# Patient Record
Sex: Female | Born: 1980
Health system: Southern US, Community
[De-identification: ages and names within clinical notes are randomized; demographics above are authoritative.]

## PROBLEM LIST (undated history)

## (undated) DIAGNOSIS — M26649 Arthritis of unspecified temporomandibular joint: Secondary | ICD-10-CM

## (undated) DIAGNOSIS — R112 Nausea with vomiting, unspecified: Secondary | ICD-10-CM

## (undated) DIAGNOSIS — F32A Depression, unspecified: Secondary | ICD-10-CM

## (undated) DIAGNOSIS — R51 Headache: Secondary | ICD-10-CM

## (undated) DIAGNOSIS — IMO0002 Reserved for concepts with insufficient information to code with codable children: Secondary | ICD-10-CM

## (undated) DIAGNOSIS — M1990 Unspecified osteoarthritis, unspecified site: Secondary | ICD-10-CM

## (undated) DIAGNOSIS — M329 Systemic lupus erythematosus, unspecified: Secondary | ICD-10-CM

## (undated) DIAGNOSIS — Z87442 Personal history of urinary calculi: Secondary | ICD-10-CM

## (undated) DIAGNOSIS — G4730 Sleep apnea, unspecified: Secondary | ICD-10-CM

## (undated) DIAGNOSIS — M2669 Other specified disorders of temporomandibular joint: Secondary | ICD-10-CM

## (undated) DIAGNOSIS — Z9889 Other specified postprocedural states: Secondary | ICD-10-CM

## (undated) DIAGNOSIS — F419 Anxiety disorder, unspecified: Secondary | ICD-10-CM

## (undated) DIAGNOSIS — F329 Major depressive disorder, single episode, unspecified: Secondary | ICD-10-CM

## (undated) DIAGNOSIS — I10 Essential (primary) hypertension: Secondary | ICD-10-CM

## (undated) DIAGNOSIS — R42 Dizziness and giddiness: Secondary | ICD-10-CM

## (undated) DIAGNOSIS — T148XXA Other injury of unspecified body region, initial encounter: Secondary | ICD-10-CM

## (undated) HISTORY — DX: Other injury of unspecified body region, initial encounter: T14.8XXA

## (undated) HISTORY — DX: Sleep apnea, unspecified: G47.30

## (undated) HISTORY — PX: CRANIOTOMY: SHX93

## (undated) HISTORY — PX: DENTAL SURGERY: SHX609

## (undated) HISTORY — PX: HERNIA REPAIR: SHX51

---

## 2005-10-23 ENCOUNTER — Ambulatory Visit (HOSPITAL_COMMUNITY): Admission: RE | Admit: 2005-10-23 | Discharge: 2005-10-23 | Payer: Self-pay | Admitting: Neurosurgery

## 2005-11-20 ENCOUNTER — Inpatient Hospital Stay (HOSPITAL_COMMUNITY): Admission: RE | Admit: 2005-11-20 | Discharge: 2005-11-23 | Payer: Self-pay | Admitting: Neurosurgery

## 2005-12-03 ENCOUNTER — Encounter: Admission: RE | Admit: 2005-12-03 | Discharge: 2005-12-03 | Payer: Self-pay | Admitting: Neurosurgery

## 2006-02-26 ENCOUNTER — Ambulatory Visit (HOSPITAL_COMMUNITY): Admission: RE | Admit: 2006-02-26 | Discharge: 2006-02-26 | Payer: Self-pay | Admitting: Neurosurgery

## 2010-08-21 ENCOUNTER — Emergency Department: Payer: Self-pay | Admitting: Emergency Medicine

## 2012-02-11 ENCOUNTER — Emergency Department: Payer: Self-pay | Admitting: Unknown Physician Specialty

## 2012-11-11 HISTORY — PX: OTHER SURGICAL HISTORY: SHX169

## 2012-11-25 ENCOUNTER — Ambulatory Visit: Payer: Self-pay | Admitting: Physician Assistant

## 2013-05-24 ENCOUNTER — Ambulatory Visit: Payer: Self-pay | Admitting: Neurology

## 2013-05-28 ENCOUNTER — Emergency Department: Payer: Self-pay | Admitting: Emergency Medicine

## 2013-06-07 ENCOUNTER — Ambulatory Visit: Payer: Self-pay | Admitting: Orthopedic Surgery

## 2013-06-15 ENCOUNTER — Ambulatory Visit: Payer: Self-pay | Admitting: Orthopedic Surgery

## 2013-07-30 ENCOUNTER — Ambulatory Visit: Payer: Self-pay | Admitting: Neurology

## 2013-09-01 ENCOUNTER — Other Ambulatory Visit: Payer: Self-pay | Admitting: Neurosurgery

## 2013-09-21 ENCOUNTER — Encounter (HOSPITAL_COMMUNITY): Payer: Self-pay | Admitting: Pharmacy Technician

## 2013-09-24 ENCOUNTER — Encounter (HOSPITAL_COMMUNITY)
Admission: RE | Admit: 2013-09-24 | Discharge: 2013-09-24 | Disposition: A | Discharge status: Home / Self Care | Payer: Medicaid Other | Source: Ambulatory Visit | Attending: Anesthesiology | Admitting: Anesthesiology

## 2013-09-24 ENCOUNTER — Encounter (HOSPITAL_COMMUNITY): Payer: Self-pay

## 2013-09-24 ENCOUNTER — Encounter (HOSPITAL_COMMUNITY)
Admission: RE | Admit: 2013-09-24 | Discharge: 2013-09-24 | Disposition: A | Discharge status: Home / Self Care | Payer: Medicaid Other | Source: Ambulatory Visit | Attending: Neurosurgery | Admitting: Neurosurgery

## 2013-09-24 DIAGNOSIS — Z01812 Encounter for preprocedural laboratory examination: Secondary | ICD-10-CM | POA: Insufficient documentation

## 2013-09-24 DIAGNOSIS — Z01818 Encounter for other preprocedural examination: Secondary | ICD-10-CM | POA: Insufficient documentation

## 2013-09-24 HISTORY — DX: Dizziness and giddiness: R42

## 2013-09-24 HISTORY — DX: Reserved for concepts with insufficient information to code with codable children: IMO0002

## 2013-09-24 HISTORY — DX: Other specified postprocedural states: R11.2

## 2013-09-24 HISTORY — DX: Major depressive disorder, single episode, unspecified: F32.9

## 2013-09-24 HISTORY — DX: Anxiety disorder, unspecified: F41.9

## 2013-09-24 HISTORY — DX: Unspecified osteoarthritis, unspecified site: M19.90

## 2013-09-24 HISTORY — DX: Personal history of urinary calculi: Z87.442

## 2013-09-24 HISTORY — DX: Essential (primary) hypertension: I10

## 2013-09-24 HISTORY — DX: Other specified postprocedural states: Z98.890

## 2013-09-24 HISTORY — DX: Systemic lupus erythematosus, unspecified: M32.9

## 2013-09-24 HISTORY — DX: Depression, unspecified: F32.A

## 2013-09-24 HISTORY — DX: Headache: R51

## 2013-09-24 LAB — CBC
HCT: 36.9 % (ref 36.0–46.0)
MCV: 86.2 fL (ref 78.0–100.0)
RBC: 4.28 MIL/uL (ref 3.87–5.11)
RDW: 13.8 % (ref 11.5–15.5)
WBC: 7.4 10*3/uL (ref 4.0–10.5)

## 2013-09-24 LAB — BASIC METABOLIC PANEL
BUN: 7 mg/dL (ref 6–23)
Calcium: 9.3 mg/dL (ref 8.4–10.5)
Creatinine, Ser: 0.69 mg/dL (ref 0.50–1.10)
GFR calc non Af Amer: >90 mL/min (ref >90)
Glucose, Bld: 95 mg/dL (ref 70–99)
Sodium: 137 mEq/L (ref 135–145)

## 2013-09-24 LAB — HCG, SERUM, QUALITATIVE: Preg, Serum: NEGATIVE

## 2013-09-24 LAB — TYPE AND SCREEN
ABO/RH(D): O POS
Antibody Screen: NEGATIVE

## 2013-09-24 NOTE — Pre-Procedure Instructions (Signed)
Morgan Kent  09/24/2013   Your procedure is scheduled on:  Wednesday, November 19th  Report to Main Entrance "A" and check in with admitting at 0745 AM.  Call this number if you have problems the morning of surgery: [phone]   Remember:   Do not eat food or drink liquids after midnight.   Take these medicines the morning of surgery with A SIP OF WATER: atenolol, neurontin, zoloft, flonase   Do not wear jewelry, make-up or nail polish.  Do not wear lotions, powders, or perfumes. You may wear deodorant.  Do not shave 48 hours prior to surgery. Men may shave face and neck.  Do not bring valuables to the hospital.  [facility] is not responsible   for any belongings or valuables.               Contacts, dentures or bridgework may not be worn into surgery.  Leave suitcase in the car. After surgery it may be brought to your room.  For patients admitted to the hospital, discharge time is determined by your  treatment team.               Patients discharged the day of surgery will not be allowed to drive home.    Special Instructions: Shower using CHG 2 nights before surgery and the night before surgery.  If you shower the day of surgery use CHG.  Use special wash - you have one bottle of CHG for all showers.  You should use approximately 1/3 of the bottle for each shower.   Please read over the following fact sheets that you were given: Pain Booklet, Coughing and Deep Breathing, Blood Transfusion Information, MRSA Information and Surgical Site Infection Prevention

## 2013-09-24 NOTE — Progress Notes (Signed)
Primary - dr. Welton Flakes  At [facility] medical Does not have a cardiologist  ekg beginning of this year - will request records

## 2013-09-28 MED ORDER — CEFAZOLIN SODIUM-DEXTROSE 2-3 GM-% IV SOLR
2.0000 g | INTRAVENOUS | Status: AC
  Administered 2013-09-29: 2 g via INTRAVENOUS

## 2013-09-29 ENCOUNTER — Encounter (HOSPITAL_COMMUNITY): Payer: Self-pay | Admitting: Certified Registered Nurse Anesthetist

## 2013-09-29 ENCOUNTER — Ambulatory Visit (HOSPITAL_COMMUNITY): Payer: Medicaid Other

## 2013-09-29 ENCOUNTER — Encounter (HOSPITAL_COMMUNITY): Payer: Medicaid Other | Admitting: Certified Registered Nurse Anesthetist

## 2013-09-29 ENCOUNTER — Inpatient Hospital Stay (HOSPITAL_COMMUNITY)
Admission: RE | Admit: 2013-09-29 | Discharge: 2013-10-02 | DRG: 025 | Disposition: A | Discharge status: Home / Self Care | Payer: Medicaid Other | Source: Ambulatory Visit | Attending: Neurosurgery | Admitting: Neurosurgery

## 2013-09-29 ENCOUNTER — Other Ambulatory Visit: Payer: Self-pay

## 2013-09-29 ENCOUNTER — Encounter (HOSPITAL_COMMUNITY)
Admission: RE | Disposition: A | Discharge status: Home / Self Care | Payer: Self-pay | Source: Ambulatory Visit | Attending: Neurosurgery

## 2013-09-29 ENCOUNTER — Ambulatory Visit (HOSPITAL_COMMUNITY): Payer: Medicaid Other | Admitting: Certified Registered Nurse Anesthetist

## 2013-09-29 DIAGNOSIS — G950 Syringomyelia and syringobulbia: Principal | ICD-10-CM | POA: Diagnosis present

## 2013-09-29 DIAGNOSIS — M329 Systemic lupus erythematosus, unspecified: Secondary | ICD-10-CM | POA: Diagnosis present

## 2013-09-29 DIAGNOSIS — Z79899 Other long term (current) drug therapy: Secondary | ICD-10-CM

## 2013-09-29 DIAGNOSIS — F411 Generalized anxiety disorder: Secondary | ICD-10-CM | POA: Diagnosis present

## 2013-09-29 DIAGNOSIS — Z6841 Body Mass Index (BMI) 40.0 and over, adult: Secondary | ICD-10-CM

## 2013-09-29 DIAGNOSIS — G935 Compression of brain: Secondary | ICD-10-CM | POA: Diagnosis present

## 2013-09-29 DIAGNOSIS — F3289 Other specified depressive episodes: Secondary | ICD-10-CM | POA: Diagnosis present

## 2013-09-29 DIAGNOSIS — Z23 Encounter for immunization: Secondary | ICD-10-CM

## 2013-09-29 DIAGNOSIS — F329 Major depressive disorder, single episode, unspecified: Secondary | ICD-10-CM | POA: Diagnosis present

## 2013-09-29 DIAGNOSIS — I10 Essential (primary) hypertension: Secondary | ICD-10-CM | POA: Diagnosis present

## 2013-09-29 HISTORY — PX: SUBOCCIPITAL CRANIECTOMY CERVICAL LAMINECTOMY: SHX5404

## 2013-09-29 SURGERY — SUBOCCIPITAL CRANIECTOMY CERVICAL LAMINECTOMY/DURAPLASTY
Anesthesia: General | Site: Neck | Wound class: Clean

## 2013-09-29 MED ORDER — VECURONIUM BROMIDE 10 MG IV SOLR
INTRAVENOUS | Status: DC | PRN
  Administered 2013-09-29: 2 mg via INTRAVENOUS
  Administered 2013-09-29 (×4): 1 mg via INTRAVENOUS

## 2013-09-29 MED ORDER — SERTRALINE HCL 100 MG PO TABS
100.0000 mg | ORAL_TABLET | Freq: Two times a day (BID) | ORAL | Status: DC
Start: 2013-09-29 — End: 2013-10-02
  Administered 2013-09-29 – 2013-10-02 (×7): 100 mg via ORAL
  Filled 2013-09-29 (×8): qty 1

## 2013-09-29 MED ORDER — GLYCOPYRROLATE 0.2 MG/ML IJ SOLN
INTRAMUSCULAR | Status: DC | PRN
  Administered 2013-09-29: .8 mg via INTRAVENOUS

## 2013-09-29 MED ORDER — OXYCODONE HCL 5 MG PO TABS
5.0000 mg | ORAL_TABLET | Freq: Once | ORAL | Status: DC | PRN
Start: 2013-09-29 — End: 2013-09-29

## 2013-09-29 MED ORDER — SODIUM CHLORIDE 0.9 % IR SOLN
Status: DC | PRN
  Administered 2013-09-29: 13:00:00

## 2013-09-29 MED ORDER — CEFAZOLIN SODIUM-DEXTROSE 2-3 GM-% IV SOLR
2.0000 g | Freq: Three times a day (TID) | INTRAVENOUS | Status: DC
  Administered 2013-09-29: 2 g via INTRAVENOUS
  Filled 2013-09-29 (×3): qty 50

## 2013-09-29 MED ORDER — OXYCODONE HCL 5 MG/5ML PO SOLN: 5.0000 mg | Freq: Once | ORAL | Status: DC | PRN

## 2013-09-29 MED ORDER — LACTATED RINGERS IV SOLN
INTRAVENOUS | Status: DC | PRN
  Administered 2013-09-29 (×2): via INTRAVENOUS

## 2013-09-29 MED ORDER — HYDROMORPHONE HCL PF 1 MG/ML IJ SOLN
INTRAMUSCULAR | Status: AC
  Filled 2013-09-29: qty 1

## 2013-09-29 MED ORDER — PROMETHAZINE HCL 25 MG/ML IJ SOLN: 6.2500 mg | INTRAMUSCULAR | Status: DC | PRN

## 2013-09-29 MED ORDER — ONDANSETRON HCL 4 MG PO TABS: 4.0000 mg | ORAL_TABLET | ORAL | Status: DC | PRN

## 2013-09-29 MED ORDER — FLUTICASONE PROPIONATE 50 MCG/ACT NA SUSP
1.0000 | Freq: Every day | NASAL | Status: DC
  Administered 2013-09-29 – 2013-10-02 (×3): 1 via NASAL
  Filled 2013-09-29: qty 16

## 2013-09-29 MED ORDER — NORTRIPTYLINE HCL 10 MG PO CAPS
20.0000 mg | ORAL_CAPSULE | Freq: Every day | ORAL | Status: DC
  Administered 2013-09-29 – 2013-10-01 (×3): 20 mg via ORAL
  Filled 2013-09-29 (×4): qty 2

## 2013-09-29 MED ORDER — MORPHINE SULFATE 2 MG/ML IJ SOLN
1.0000 mg | INTRAMUSCULAR | Status: DC | PRN
  Administered 2013-09-29 – 2013-10-01 (×6): 2 mg via INTRAVENOUS
  Filled 2013-09-29 (×6): qty 1

## 2013-09-29 MED ORDER — DOCUSATE SODIUM 100 MG PO CAPS
100.0000 mg | ORAL_CAPSULE | Freq: Two times a day (BID) | ORAL | Status: DC
  Administered 2013-09-29 – 2013-10-02 (×7): 100 mg via ORAL
  Filled 2013-09-29 (×7): qty 1

## 2013-09-29 MED ORDER — ONDANSETRON HCL 4 MG/2ML IJ SOLN
INTRAMUSCULAR | Status: DC | PRN
  Administered 2013-09-29: 4 mg via INTRAVENOUS

## 2013-09-29 MED ORDER — FENTANYL CITRATE 0.05 MG/ML IJ SOLN
INTRAMUSCULAR | Status: DC | PRN
  Administered 2013-09-29 (×7): 50 ug via INTRAVENOUS
  Administered 2013-09-29: 100 ug via INTRAVENOUS
  Administered 2013-09-29: 50 ug via INTRAVENOUS

## 2013-09-29 MED ORDER — PROPOFOL 10 MG/ML IV BOLUS
INTRAVENOUS | Status: DC | PRN
  Administered 2013-09-29: 80 mg via INTRAVENOUS
  Administered 2013-09-29: 150 mg via INTRAVENOUS

## 2013-09-29 MED ORDER — HYDROMORPHONE HCL PF 1 MG/ML IJ SOLN
0.2500 mg | INTRAMUSCULAR | Status: DC | PRN
  Administered 2013-09-29 (×4): 0.5 mg via INTRAVENOUS

## 2013-09-29 MED ORDER — PROMETHAZINE HCL 12.5 MG PO TABS
12.5000 mg | ORAL_TABLET | ORAL | Status: DC | PRN
  Filled 2013-09-29: qty 2

## 2013-09-29 MED ORDER — 0.9 % SODIUM CHLORIDE (POUR BTL) OPTIME
TOPICAL | Status: DC | PRN
  Administered 2013-09-29: 1000 mL

## 2013-09-29 MED ORDER — HYDROXYCHLOROQUINE SULFATE 200 MG PO TABS
200.0000 mg | ORAL_TABLET | Freq: Two times a day (BID) | ORAL | Status: DC
  Administered 2013-09-29 – 2013-10-02 (×6): 200 mg via ORAL
  Filled 2013-09-29 (×7): qty 1

## 2013-09-29 MED ORDER — HYDROCODONE-ACETAMINOPHEN 5-325 MG PO TABS
1.0000 | ORAL_TABLET | ORAL | Status: DC | PRN
  Administered 2013-09-29 – 2013-10-02 (×13): 1 via ORAL
  Filled 2013-09-29 (×13): qty 1

## 2013-09-29 MED ORDER — LACTATED RINGERS IV SOLN
INTRAVENOUS | Status: DC
  Administered 2013-09-29 (×2): via INTRAVENOUS

## 2013-09-29 MED ORDER — NORETHINDRONE 0.35 MG PO TABS: 1.0000 | ORAL_TABLET | Freq: Every day | ORAL | Status: DC

## 2013-09-29 MED ORDER — ROCURONIUM BROMIDE 100 MG/10ML IV SOLN
INTRAVENOUS | Status: DC | PRN
  Administered 2013-09-29: 50 mg via INTRAVENOUS

## 2013-09-29 MED ORDER — LABETALOL HCL 5 MG/ML IV SOLN: 10.0000 mg | INTRAVENOUS | Status: DC | PRN

## 2013-09-29 MED ORDER — MIDAZOLAM HCL 5 MG/5ML IJ SOLN
INTRAMUSCULAR | Status: DC | PRN
  Administered 2013-09-29: 2 mg via INTRAVENOUS

## 2013-09-29 MED ORDER — LIDOCAINE HCL (CARDIAC) 20 MG/ML IV SOLN
INTRAVENOUS | Status: DC | PRN
  Administered 2013-09-29: 100 mg via INTRAVENOUS

## 2013-09-29 MED ORDER — SODIUM CHLORIDE 0.9 % IV SOLN
INTRAVENOUS | Status: DC | PRN
  Administered 2013-09-29: 10:00:00 via INTRAVENOUS

## 2013-09-29 MED ORDER — BUPIVACAINE-EPINEPHRINE 0.5% -1:200000 IJ SOLN
INTRAMUSCULAR | Status: DC | PRN
  Administered 2013-09-29: 10 mL

## 2013-09-29 MED ORDER — FLEET ENEMA 7-19 GM/118ML RE ENEM
1.0000 | ENEMA | Freq: Once | RECTAL | Status: AC | PRN
  Filled 2013-09-29: qty 1

## 2013-09-29 MED ORDER — SCOPOLAMINE 1 MG/3DAYS TD PT72
MEDICATED_PATCH | TRANSDERMAL | Status: AC
  Administered 2013-09-29: 1 via TRANSDERMAL
  Filled 2013-09-29: qty 1

## 2013-09-29 MED ORDER — NEOSTIGMINE METHYLSULFATE 1 MG/ML IJ SOLN
INTRAMUSCULAR | Status: DC | PRN
  Administered 2013-09-29: 5 mg via INTRAVENOUS

## 2013-09-29 MED ORDER — MONTELUKAST SODIUM 10 MG PO TABS
10.0000 mg | ORAL_TABLET | Freq: Every day | ORAL | Status: DC
  Administered 2013-09-29 – 2013-10-01 (×3): 10 mg via ORAL
  Filled 2013-09-29 (×4): qty 1

## 2013-09-29 MED ORDER — GABAPENTIN 300 MG PO CAPS
300.0000 mg | ORAL_CAPSULE | Freq: Three times a day (TID) | ORAL | Status: DC
  Administered 2013-09-29 – 2013-10-02 (×9): 300 mg via ORAL
  Filled 2013-09-29 (×12): qty 1

## 2013-09-29 MED ORDER — CEFAZOLIN SODIUM-DEXTROSE 2-3 GM-% IV SOLR
INTRAVENOUS | Status: AC
  Filled 2013-09-29: qty 50

## 2013-09-29 MED ORDER — DEXAMETHASONE SODIUM PHOSPHATE 10 MG/ML IJ SOLN
INTRAMUSCULAR | Status: DC | PRN
  Administered 2013-09-29: 10 mg via INTRAVENOUS

## 2013-09-29 MED ORDER — ONDANSETRON HCL 4 MG/2ML IJ SOLN: 4.0000 mg | INTRAMUSCULAR | Status: DC | PRN

## 2013-09-29 MED ORDER — PANTOPRAZOLE SODIUM 40 MG IV SOLR
40.0000 mg | Freq: Every day | INTRAVENOUS | Status: DC
  Administered 2013-09-29: 40 mg via INTRAVENOUS
  Filled 2013-09-29 (×2): qty 40

## 2013-09-29 MED ORDER — INFLUENZA VAC SPLIT QUAD 0.5 ML IM SUSP
0.5000 mL | INTRAMUSCULAR | Status: AC
  Administered 2013-09-30: 0.5 mL via INTRAMUSCULAR
  Filled 2013-09-29: qty 0.5

## 2013-09-29 MED ORDER — BISACODYL 10 MG RE SUPP: 10.0000 mg | Freq: Every day | RECTAL | Status: DC | PRN

## 2013-09-29 MED ORDER — SODIUM CHLORIDE 0.9 % IV SOLN
500.0000 mg | Freq: Two times a day (BID) | INTRAVENOUS | Status: DC
  Administered 2013-09-29 – 2013-09-30 (×2): 500 mg via INTRAVENOUS
  Filled 2013-09-29 (×4): qty 5

## 2013-09-29 MED ORDER — THROMBIN 20000 UNITS EX SOLR
CUTANEOUS | Status: DC | PRN
  Administered 2013-09-29: 13:00:00 via TOPICAL

## 2013-09-29 MED ORDER — ACETAMINOPHEN 325 MG PO TABS: 650.0000 mg | ORAL_TABLET | ORAL | Status: DC | PRN

## 2013-09-29 MED ORDER — HYDROCHLOROTHIAZIDE 25 MG PO TABS
25.0000 mg | ORAL_TABLET | Freq: Every day | ORAL | Status: DC
Start: 2013-09-29 — End: 2013-10-02
  Administered 2013-09-29 – 2013-10-02 (×4): 25 mg via ORAL
  Filled 2013-09-29 (×4): qty 1

## 2013-09-29 MED ORDER — CEFAZOLIN SODIUM-DEXTROSE 2-3 GM-% IV SOLR
2.0000 g | Freq: Three times a day (TID) | INTRAVENOUS | Status: AC
  Administered 2013-09-30: 2 g via INTRAVENOUS
  Filled 2013-09-29: qty 50

## 2013-09-29 MED ORDER — BACITRACIN ZINC 500 UNIT/GM EX OINT
TOPICAL_OINTMENT | CUTANEOUS | Status: DC | PRN
  Administered 2013-09-29: 1 via TOPICAL

## 2013-09-29 MED ORDER — ATENOLOL 25 MG PO TABS
25.0000 mg | ORAL_TABLET | Freq: Every day | ORAL | Status: DC
  Administered 2013-09-30 – 2013-10-02 (×3): 25 mg via ORAL
  Filled 2013-09-29 (×4): qty 1

## 2013-09-29 MED ORDER — ACETAMINOPHEN 650 MG RE SUPP: 650.0000 mg | RECTAL | Status: DC | PRN

## 2013-09-29 SURGICAL SUPPLY — 76 items
BAG DECANTER FOR FLEXI CONT (MISCELLANEOUS) ×2 IMPLANT
BENZOIN TINCTURE PRP APPL 2/3 (GAUZE/BANDAGES/DRESSINGS) IMPLANT
BLADE CLIPPER SURG NEURO (BLADE) ×2 IMPLANT
BLADE ULTRA TIP 2M (BLADE) IMPLANT
BRUSH SCRUB EZ 1% IODOPHOR (MISCELLANEOUS) IMPLANT
BRUSH SCRUB EZ PLAIN DRY (MISCELLANEOUS) ×2 IMPLANT
BUR ACORN 6.0 PRECISION (BURR) ×2 IMPLANT
BUR MATCHSTICK NEURO 3.0 LAGG (BURR) ×2 IMPLANT
CANISTER SUCT 3000ML (MISCELLANEOUS) ×2 IMPLANT
CLIP TI MEDIUM 6 (CLIP) IMPLANT
CONT SPEC 4OZ CLIKSEAL STRL BL (MISCELLANEOUS) ×2 IMPLANT
CORDS BIPOLAR (ELECTRODE) ×2 IMPLANT
COVER MAYO STAND STRL (DRAPES) IMPLANT
DRAIN SNY WOU 7FLT (WOUND CARE) IMPLANT
DRAPE LAPAROTOMY 100X72 PEDS (DRAPES) ×2 IMPLANT
DRAPE MICROSCOPE LEICA (MISCELLANEOUS) ×2 IMPLANT
DRAPE MICROSCOPE ZEISS OPMI (DRAPES) IMPLANT
DRAPE SURG 17X23 STRL (DRAPES) ×6 IMPLANT
DRAPE WARM FLUID 44X44 (DRAPE) ×2 IMPLANT
DRESSING TELFA 8X3 (GAUZE/BANDAGES/DRESSINGS) IMPLANT
DURAGUARD 04CMX04CM ×2 IMPLANT
DURASEAL APPLICATOR TIP (TIP) ×2 IMPLANT
DURASEAL SPINE SEALANT 3ML (MISCELLANEOUS) ×2 IMPLANT
ELECT BLADE 4.0 EZ CLEAN MEGAD (MISCELLANEOUS) ×2
ELECT CAUTERY BLADE 6.4 (BLADE) IMPLANT
ELECT REM PT RETURN 9FT ADLT (ELECTROSURGICAL) ×2
ELECTRODE BLDE 4.0 EZ CLN MEGD (MISCELLANEOUS) ×1 IMPLANT
ELECTRODE REM PT RTRN 9FT ADLT (ELECTROSURGICAL) ×1 IMPLANT
EVACUATOR 1/8 PVC DRAIN (DRAIN) IMPLANT
EVACUATOR SILICONE 100CC (DRAIN) IMPLANT
GAUZE SPONGE 4X4 16PLY XRAY LF (GAUZE/BANDAGES/DRESSINGS) IMPLANT
GLOVE BIO SURGEON STRL SZ8.5 (GLOVE) ×2 IMPLANT
GLOVE BIOGEL PI IND STRL 7.0 (GLOVE) ×2 IMPLANT
GLOVE BIOGEL PI IND STRL 8 (GLOVE) ×1 IMPLANT
GLOVE BIOGEL PI INDICATOR 7.0 (GLOVE) ×2
GLOVE BIOGEL PI INDICATOR 8 (GLOVE) ×1
GLOVE ECLIPSE 7.5 STRL STRAW (GLOVE) ×2 IMPLANT
GLOVE ECLIPSE 8.5 STRL (GLOVE) ×2 IMPLANT
GLOVE EXAM NITRILE LRG STRL (GLOVE) IMPLANT
GLOVE EXAM NITRILE MD LF STRL (GLOVE) ×2 IMPLANT
GLOVE EXAM NITRILE XL STR (GLOVE) IMPLANT
GLOVE EXAM NITRILE XS STR PU (GLOVE) IMPLANT
GLOVE SS BIOGEL STRL SZ 8 (GLOVE) ×1 IMPLANT
GLOVE SUPERSENSE BIOGEL SZ 8 (GLOVE) ×1
GLOVE SURG SS PI 7.0 STRL IVOR (GLOVE) ×14 IMPLANT
GOWN BRE IMP SLV AUR LG STRL (GOWN DISPOSABLE) ×4 IMPLANT
GOWN BRE IMP SLV AUR XL STRL (GOWN DISPOSABLE) ×6 IMPLANT
HANDLE SUPERFICIAL 5IN (MISCELLANEOUS) ×2 IMPLANT
KIT BASIN OR (CUSTOM PROCEDURE TRAY) ×2 IMPLANT
KIT ROOM TURNOVER OR (KITS) ×2 IMPLANT
NEEDLE HYPO 22GX1.5 SAFETY (NEEDLE) ×2 IMPLANT
NS IRRIG 1000ML POUR BTL (IV SOLUTION) ×2 IMPLANT
PACK CRANIOTOMY (CUSTOM PROCEDURE TRAY) ×2 IMPLANT
PAD ARMBOARD 7.5X6 YLW CONV (MISCELLANEOUS) ×10 IMPLANT
PATTIES SURGICAL 1/4 X 3 (GAUZE/BANDAGES/DRESSINGS) IMPLANT
PIN MAYFIELD SKULL DISP (PIN) ×2 IMPLANT
RUBBERBAND STERILE (MISCELLANEOUS) IMPLANT
SPONGE GAUZE 4X4 12PLY (GAUZE/BANDAGES/DRESSINGS) ×2 IMPLANT
SPONGE LAP 4X18 X RAY DECT (DISPOSABLE) IMPLANT
STAPLER SKIN PROX WIDE 3.9 (STAPLE) ×2 IMPLANT
SUT ETHILON 2 0 FS 18 (SUTURE) ×6 IMPLANT
SUT ETHILON 3 0 FSL (SUTURE) IMPLANT
SUT NURALON 4 0 TR CR/8 (SUTURE) ×4 IMPLANT
SUT PROLENE 6 0 BV (SUTURE) ×16 IMPLANT
SUT VIC AB 1 CT1 18XBRD ANBCTR (SUTURE) ×2 IMPLANT
SUT VIC AB 1 CT1 8-18 (SUTURE) ×2
SUT VIC AB 2-0 CP2 18 (SUTURE) ×4 IMPLANT
SUT VIC AB 3-0 SH 8-18 (SUTURE) ×2 IMPLANT
SYR 20ML ECCENTRIC (SYRINGE) ×2 IMPLANT
SYR CONTROL 10ML LL (SYRINGE) ×2 IMPLANT
TAPE CLOTH SURG 4X10 WHT LF (GAUZE/BANDAGES/DRESSINGS) ×2 IMPLANT
TOWEL OR 17X24 6PK STRL BLUE (TOWEL DISPOSABLE) ×2 IMPLANT
TOWEL OR 17X26 10 PK STRL BLUE (TOWEL DISPOSABLE) ×2 IMPLANT
TRAY FOLEY CATH 14FRSI W/METER (CATHETERS) ×2 IMPLANT
UNDERPAD 30X30 INCONTINENT (UNDERPADS AND DIAPERS) ×2 IMPLANT
WATER STERILE IRR 1000ML POUR (IV SOLUTION) ×2 IMPLANT

## 2013-09-29 NOTE — Progress Notes (Signed)
Patient ID: Morgan Kent, female   DOB: 25-Jul-1981, 32 y.o.   MRN: [id number] Subjective:  The patient is somnolent but arousable. She is in no apparent distress.  Objective: Vital signs in last 24 hours: Temp:  [97.1 F (36.2 C)] 97.1 F (36.2 C) (11/19 0813) Pulse Rate:  [88] 88 (11/19 0813) Resp:  [20] 20 (11/19 0813) BP: (138)/(80) 138/80 mmHg (11/19 0813) SpO2:  [100 %] 100 % (11/19 0813)  Intake/Output from previous day:   Intake/Output this shift: Total I/O In: 2000 [I.V.:2000] Out: 250 [Urine:175; Blood:75]  Physical exam the patient is somnolent but arousable. She is moving all 4 extremities well.  Lab Results: No results found for this basename: WBC, HGB, HCT, PLT,  in the last 72 hours BMET No results found for this basename: NA, K, CL, CO2, GLUCOSE, BUN, CREATININE, CALCIUM,  in the last 72 hours  Studies/Results: Dg Cervical Spine 1 View  09/29/2013   CLINICAL DATA:  Instrument localization evaluation.  EXAM: DG CERVICAL SPINE - 1 VIEW  COMPARISON:  None.  FINDINGS: Tissue expanders identified within the posterior aspect of the neck. A hemostat tip projects at the level of the posterior elements of C2.  IMPRESSION: Instrument localization for C1-2 laminectomy   Electronically Signed   By: Salome Holmes M.D.   On: 09/29/2013 15:10    Assessment/Plan: The patient is doing well.  LOS: 0 days     Davide Risdon D 09/29/2013, 3:23 PM

## 2013-09-29 NOTE — Anesthesia Preprocedure Evaluation (Addendum)
Anesthesia Evaluation  Patient identified by MRN, date of birth, ID band Patient awake    History of Anesthesia Complications (+) PONV and history of anesthetic complications  Airway Mallampati: II  Neck ROM: Full    Dental  (+) Teeth Intact and Missing   Pulmonary  breath sounds clear to auscultation        Cardiovascular hypertension, Rhythm:Regular Rate:Normal     Neuro/Psych  Headaches, Chiari malformation dx    GI/Hepatic   Endo/Other  Morbid obesity  Renal/GU      Musculoskeletal   Abdominal (+) + obese,   Peds  Hematology   Anesthesia Other Findings   Reproductive/Obstetrics                          Anesthesia Physical Anesthesia Plan  ASA: II  Anesthesia Plan: General   Post-op Pain Management:    Induction: Intravenous  Airway Management Planned: Oral ETT  Additional Equipment:   Intra-op Plan:   Post-operative Plan: Extubation in OR  Informed Consent: I have reviewed the patients History and Physical, chart, labs and discussed the procedure including the risks, benefits and alternatives for the proposed anesthesia with the patient or authorized representative who has indicated his/her understanding and acceptance.   Dental advisory given  Plan Discussed with:   Anesthesia Plan Comments:         Anesthesia Quick Evaluation

## 2013-09-29 NOTE — H&P (Signed)
Subjective: The patient is a 32 year old black female who I previously performed a suboccipital craniectomy and cervical laminectomy with duraplasty for a Chiari malformation with syringomyelia. The patient initially has done well and had a postoperative MRI which demonstrated a good resolution of her syrinx. More recently the patient has developed recurrent symptoms consistent with a central cord syndrome. She was worked up with a cervical MRI which demonstrates a recurrence of her syrinx. I discussed the various treatment options with the patient including surgery. The patient has weighed the risks, benefits, and alternatives surgery and decided proceed with a redo Chiari decompression and duraplasty.  Past Medical History  Diagnosis Date  . PONV (postoperative nausea and vomiting)   . Hypertension   . Depression   . Anxiety   . History of kidney stones   . Headache(784.0)   . Dizziness   . Arthritis     rheumo possible  . Lupus   . Chiari malformation     Past Surgical History  Procedure Laterality Date  . Craniotomy    . Carpel tunnel  Left 2014  . Dental surgery    . Hernia repair      umbilical    No Known Allergies  History  Substance Use Topics  . Smoking status: Never Smoker   . Smokeless tobacco: Not on file  . Alcohol Use: No    No family history on file. Prior to Admission medications   Medication Sig Start Date End Date Taking? Authorizing Provider  antiseptic oral rinse (BIOTENE) LIQD 15 mLs by Mouth Rinse route 3 (three) times daily as needed for dry mouth.   Yes Historical Provider, MD  atenolol (TENORMIN) 25 MG tablet Take 25 mg by mouth daily.   Yes Historical Provider, MD  clobetasol (OLUX) 0.05 % topical foam Apply 1 application topically daily. To arms and body   Yes Historical Provider, MD  clobetasol (TEMOVATE) 0.05 % external solution Apply 1 application topically daily. To scalp   Yes Historical Provider, MD  fluticasone (FLONASE) 50 MCG/ACT nasal  spray Place 1 spray into both nostrils daily.   Yes Historical Provider, MD  gabapentin (NEURONTIN) 300 MG capsule Take 300 mg by mouth 3 (three) times daily.   Yes Historical Provider, MD  hydrochlorothiazide (HYDRODIURIL) 25 MG tablet Take 25 mg by mouth daily.   Yes Historical Provider, MD  hydroxychloroquine (PLAQUENIL) 200 MG tablet Take 200 mg by mouth 2 (two) times daily.   Yes Historical Provider, MD  montelukast (SINGULAIR) 10 MG tablet Take 10 mg by mouth at bedtime.   Yes Historical Provider, MD  norethindrone (MICRONOR,CAMILA,ERRIN) 0.35 MG tablet Take 1 tablet by mouth daily.   Yes Historical Provider, MD  nortriptyline (PAMELOR) 10 MG capsule Take 20 mg by mouth at bedtime.    Yes Historical Provider, MD  Polyethyl Glycol-Propyl Glycol (SYSTANE ULTRA OP) Place 1 drop into both eyes daily as needed (dry eyes).   Yes Historical Provider, MD  sertraline (ZOLOFT) 100 MG tablet Take 100 mg by mouth 2 (two) times daily.   Yes Historical Provider, MD  tacrolimus (PROTOPIC) 0.1 % ointment Apply 1 application topically daily as needed (lupus flare ups).   Yes Historical Provider, MD  triamcinolone cream (KENALOG) 0.5 % Apply 1 application topically daily as needed (lupus flare ups).   Yes Historical Provider, MD     Review of Systems  Positive ROS: As above  All other systems have been reviewed and were otherwise negative with the exception of those mentioned  in the HPI and as above.  Objective: Vital signs in last 24 hours: Temp:  [97.1 F (36.2 C)] 97.1 F (36.2 C) (11/19 0813) Pulse Rate:  [88] 88 (11/19 0813) Resp:  [20] 20 (11/19 0813) BP: (138)/(80) 138/80 mmHg (11/19 0813) SpO2:  [100 %] 100 % (11/19 0813)  General Appearance: Alert, cooperative, no distress, appears stated age Head: Normocephalic, without obvious abnormality, atraumatic Eyes: PERRL, conjunctiva/corneas clear, EOM's intact, fundi benign, both eyes      Ears: Normal TM's and external ear canals, both  ears Throat: Lips, mucosa, and tongue normal; teeth and gums normal Neck: Supple, symmetrical, trachea midline, no adenopathy; thyroid: No enlargement/tenderness/nodules; no carotid bruit or JVD Back: Symmetric, no curvature, ROM normal, no CVA tenderness Lungs: Clear to auscultation bilaterally, respirations unlabored Heart: Regular rate and rhythm, S1 and S2 normal, no murmur, rub or gallop Abdomen: Soft, non-tender, bowel sounds active all four quadrants, no masses, no organomegaly Extremities: Extremities normal, atraumatic, no cyanosis or edema Pulses: 2+ and symmetric all extremities Skin: Skin color, texture, turgor normal, no rashes or lesions  NEUROLOGIC:   Mental status: alert and oriented, no aphasia, good attention span, Fund of knowledge/ memory ok Motor Exam - grossly normal Sensory Exam - grossly normal Reflexes:  Coordination - grossly normal Gait - grossly normal Balance - grossly normal Cranial Nerves: I: smell Not tested  II: visual acuity  OS: Normal    OD: Normal   II: visual fields Full to confrontation  II: pupils Equal, round, reactive to light  III,VII: ptosis None  III,IV,VI: extraocular muscles  Full ROM  V: mastication Normal  V: facial light touch sensation  Normal  V,VII: corneal reflex  Present  VII: facial muscle function - upper  Normal  VII: facial muscle function - lower Normal  VIII: hearing Not tested  IX: soft palate elevation  Normal  IX,X: gag reflex Present  XI: trapezius strength  5/5  XI: sternocleidomastoid strength 5/5  XI: neck flexion strength  5/5  XII: tongue strength  Normal    Data Review Lab Results  Component Value Date   WBC 7.4 09/24/2013   HGB 12.6 09/24/2013   HCT 36.9 09/24/2013   MCV 86.2 09/24/2013   PLT 240 09/24/2013   Lab Results  Component Value Date   NA 137 09/24/2013   K 4.1 09/24/2013   CL 104 09/24/2013   CO2 22 09/24/2013   BUN 7 09/24/2013   CREATININE 0.69 09/24/2013   GLUCOSE 95  09/24/2013   No results found for this basename: INR, PROTIME    Assessment/Plan: Recurrent cervical syringomyelia/Chiari malformation: I discussed situation with the patient. I have reviewed her MRI scan with her and pointed out the abnormalities. We have discussed the various treatment options including a redo Chiari decompression and duraplasty. I have described the surgery to her. I've shown her surgical models. We have discussed the risks, benefits, alternatives, and likelihood of achieving our goals with surgery. I have answered all the patient's questions. She has decided to proceed with surgery.   Oaklie Durrett D 09/29/2013 10:53 AM

## 2013-09-29 NOTE — Preoperative (Signed)
Beta Blockers   Reason not to administer Beta Blockers:Not Applicable, pt took atenolol this am  

## 2013-09-29 NOTE — Transfer of Care (Signed)
Immediate Anesthesia Transfer of Care Note  Patient: Morgan Kent  Procedure(s) Performed: Procedure(s) with comments: SUBOCCIPITAL CRANIECTOMY CERVICAL LAMINECTOMY/DURAPLASTY (N/A) - posterior  Patient Location: PACU  Anesthesia Type:General  Level of Consciousness: awake, alert  and oriented  Airway & Oxygen Therapy: Patient Spontanous Breathing and Patient connected to nasal cannula oxygen  Post-op Assessment: Report given to PACU RN, Post -op Vital signs reviewed and stable and Patient moving all extremities X 4  Post vital signs: Reviewed and stable  Complications: No apparent anesthesia complications

## 2013-09-29 NOTE — Anesthesia Procedure Notes (Signed)
Procedure Name: Intubation Date/Time: 09/29/2013 11:09 AM Performed by: Margaree Mackintosh Pre-anesthesia Checklist: Patient identified, Timeout performed, Emergency Drugs available, Patient being monitored and Suction available Patient Re-evaluated:Patient Re-evaluated prior to inductionOxygen Delivery Method: Circle system utilized Preoxygenation: Pre-oxygenation with 100% oxygen Intubation Type: IV induction Ventilation: Mask ventilation without difficulty and Oral airway inserted - appropriate to patient size Laryngoscope Size: Miller and 2 (MAC 3 for DLx 1) Grade View: Grade III Tube type: Oral Tube size: 7.5 mm Number of attempts: 1 Airway Equipment and Method: Stylet Placement Confirmation: ETT inserted through vocal cords under direct vision,  positive ETCO2 and breath sounds checked- equal and bilateral Secured at: 21 cm Tube secured with: Tape Dental Injury: Teeth and Oropharynx as per pre-operative assessment

## 2013-09-29 NOTE — Op Note (Signed)
Brief history: The patient is a 32 year old black female who I performed a suboccipital craniectomy, C1 laminectomy and duraplasty for a Chiari 1 malformation and syringomyelia. The patient did well and initially had good resolution of her story to my iliac. She has developed recurrent symptoms and was worked up with a cervical MRI. This demonstrated further descent of her cerebellar tonsils and a recurrence of her syringomyelia. I discussed the situation with the patient and recommend a redo Chiari decompression. The patient has weighed the risks, benefits, and alternatives surgery and decided proceed with an operation.  Preop diagnosis: Chiari 1 malformation, syringomyelia, cervicalgia  Postop diagnosis: The same  Procedure: Redo suboccipital craniectomy and C2 and C3 laminectomy and duraplasty using microdissection  Surgeon: Dr. Delma Officer  Assistant: Dr. Shirlean Kelly  Anesthesia: Gen. endotracheal  Estimated blood loss: 100 cc  Specimens: None  Drains: None  Complications: None  Description of procedure: The patient was brought to the operating room by the anesthesia team. General endotracheal anesthesia was induced. I applied the [place] 3 point headrest the patient's calvarium. She was then turned to the prone position on the chest rolls. Her suboccipital region was then shaved with clippers and this region as well as her posterior cervical and upper thoracic region was then prepared with Betadine scrub and Betadine solution. Sterile drapes were applied. I then injected the area to be incised with Marcaine with epinephrine solution. I used a scalpel to make an incision through the patient's prior surgical scar. I used electrocautery to perform a sub-periosteal dissection dissecting through the scar tissue and exposing the previous suboccipital craniectomy, and C1 laminectomy as well as the C2 and C3 lamina. We obtained intraoperative radiograph to confirm our location. We inserted  the cerebellar retractor for exposure.  We then brought the operative microscope into the field and under its magnification and illumination we completed the microdissection/decompression. I used a high-speed drill to widen the prior C2 laminectomy as well as drill away the C3 lamina. We used a Kerrison punches to widen the laminectomies further exposing the dura. We carefully dissected and the epidural space and identified the dura. There was a scar tissue from the prior operation. We dissected away from the dura using microdissection. We identified the caudal end of the prior duraplasty and the suture line. I then incised the dura in the midline at approximately C3 with a 15 blade scalpel. We continued the durotomy in a Y-shaped fashion using the [facility] and 15 blade. We did encounter some arachnoid adhesions and stenosis at the caudal end of the cerebellar tonsils. We freed up the arachnoid adhesions and noted free flow of spinal fluid from intracranial portion into the cervical subarachnoid space. We then used dura guard to do a duraplasty. We secured in place with interrupted and running 6-0 Prolene suture. We then had anesthesia Valsalva the patient the closure appeared to be good. We then placed a DuraSeal over the suture line. We then obtained hemostasis with bipolar electrocautery. We removed the retractors and reapproximated patient's cervical thoracic fascia with interrupted #1 Vicryl suture. We reapproximated the subcutaneous tissue with interrupted 2-0 Vicryl suture. We then reapproximated the skin with a running 2-0 nylon suture. The wound was then coated with bacitracin ointment. A sterile dressing was applied. The drapes were removed. The patient was subsequently returned to the supine position. I then removed the [place] 3 point headrest from the patient's calvarium. The patient was subsequently extubated by the anesthesia team. She was transported to  the post anesthesia care unit in  stable condition. By report all sponge instrument and needle counts were correct at the end of this case.

## 2013-09-29 NOTE — Anesthesia Postprocedure Evaluation (Signed)
  Anesthesia Post-op Note  Patient: Morgan Kent  Procedure(s) Performed: Procedure(s) with comments: SUBOCCIPITAL CRANIECTOMY CERVICAL LAMINECTOMY/DURAPLASTY (N/A) - posterior  Patient Location: PACU  Anesthesia Type:General  Level of Consciousness: awake  Airway and Oxygen Therapy: Patient Spontanous Breathing  Post-op Pain: mild  Post-op Assessment: Post-op Vital signs reviewed  Post-op Vital Signs: stable  Complications: No apparent anesthesia complications

## 2013-09-30 MED ORDER — LEVETIRACETAM 500 MG PO TABS
500.0000 mg | ORAL_TABLET | Freq: Two times a day (BID) | ORAL | Status: DC
  Administered 2013-09-30 – 2013-10-02 (×4): 500 mg via ORAL
  Filled 2013-09-30 (×7): qty 1

## 2013-09-30 MED ORDER — PANTOPRAZOLE SODIUM 40 MG PO TBEC
40.0000 mg | DELAYED_RELEASE_TABLET | Freq: Every day | ORAL | Status: DC
  Administered 2013-09-30 – 2013-10-01 (×2): 40 mg via ORAL
  Filled 2013-09-30 (×2): qty 1

## 2013-09-30 NOTE — Progress Notes (Signed)
Patient ID: Morgan Kent, female   DOB: [birth year], 32 y.o.   MRN: [id number] Subjective:  The patient is alert and pleasant. She has no complaints. She is in no apparent distress. She looks well.  Objective: Vital signs in last 24 hours: Temp:  [97 F (36.1 C)-98.6 F (37 C)] 98.2 F (36.8 C) (11/20 0700) Pulse Rate:  [84-114] 108 (11/20 1000) Resp:  [10-24] 17 (11/20 1000) BP: (110-143)/(60-84) 125/68 mmHg (11/20 1000) SpO2:  [91 %-98 %] 96 % (11/20 1000) Arterial Line BP: (124-166)/(69-96) 150/81 mmHg (11/20 1000) Weight:  [114.6 kg (252 lb 10.4 oz)] 114.6 kg (252 lb 10.4 oz) (11/19 1630)  Intake/Output from previous day: 11/19 0701 - 11/20 0700 In: 3401.3 [I.V.:3091.3; IV Piggyback:310] Out: 3835 [Urine:3760; Blood:75] Intake/Output this shift: Total I/O In: 465 [P.O.:240; I.V.:225] Out: 780 [Urine:780]  Physical exam the patient is alert and oriented x3. She is moving all 4 extremities well. Her dressing is clean and dry.  Lab Results: No results found for this basename: WBC, HGB, HCT, PLT,  in the last 72 hours BMET No results found for this basename: NA, K, CL, CO2, GLUCOSE, BUN, CREATININE, CALCIUM,  in the last 72 hours  Studies/Results: Dg Cervical Spine 1 View  09/29/2013   CLINICAL DATA:  Instrument localization evaluation.  EXAM: DG CERVICAL SPINE - 1 VIEW  COMPARISON:  None.  FINDINGS: Tissue expanders identified within the posterior aspect of the neck. A hemostat tip projects at the level of the posterior elements of C2.  IMPRESSION: Instrument localization for C1-2 laminectomy   Electronically Signed   By: Salome Holmes M.D.   On: 09/29/2013 15:10    Assessment/Plan: Postop day #1: The patient is doing well. We will discontinue her Foley catheter, a Lyme, Hep-Lock her IV. And mobilize her with PT. We will transfer her to 4 N.  LOS: 1 day     Morgan Kent D 09/30/2013, 10:33 AM

## 2013-09-30 NOTE — Progress Notes (Signed)
   CARE MANAGEMENT NOTE 09/30/2013  Patient:  Morgan Kent, Morgan Kent   Account Number:  [id number]  Date Initiated:  09/30/2013  Documentation initiated by:  Jiles Crocker  Subjective/Objective Assessment:   ADMITTED FOR SURGERY     Action/Plan:   CM FOLLOWING FOR DCP   Anticipated DC Date:  10/05/2013   Anticipated DC Plan:  POSSIBLY HOME W HOME HEALTH SERVICES, AWAITING FOR PT/OT EVALS     DC Planning Services  CM consult         Status of service:  In process, will continue to follow Medicare Important Message given?  NA - LOS <3 / Initial given by admissions (If response is "NO", the following Medicare IM given date fields will be blank)  Per UR Regulation:  Reviewed for med. necessity/level of care/duration of stay  Comments:  11/20/2014Abelino Derrick RN,BSN,MHA [phone]

## 2013-10-01 MED ORDER — DEXAMETHASONE SODIUM PHOSPHATE 4 MG/ML IJ SOLN
8.0000 mg | Freq: Four times a day (QID) | INTRAMUSCULAR | Status: AC
  Administered 2013-10-01 (×3): 8 mg via INTRAVENOUS
  Filled 2013-10-01 (×2): qty 2

## 2013-10-01 NOTE — Progress Notes (Signed)
Patient ID: Morgan Kent, female   DOB: [birth year], 32 y.o.   MRN: [id number] Subjective:  the patient is alert and pleasant. She complains of neck pain/headache.  Objective: Vital signs in last 24 hours: Temp:  [97.8 F (36.6 C)-99.5 F (37.5 C)] 98.2 F (36.8 C) (11/21 0508) Pulse Rate:  [91-114] 101 (11/21 0508) Resp:  [16-20] 16 (11/21 0508) BP: (122-139)/(68-98) 131/98 mmHg (11/21 0508) SpO2:  [94 %-97 %] 97 % (11/21 0508) Arterial Line BP: (150-166)/(80-88) 150/81 mmHg (11/20 1000)  Intake/Output from previous day: 11/20 0701 - 11/21 0700 In: 780 [P.O.:480; I.V.:300] Out: 780 [Urine:780] Intake/Output this shift:    Physical exam the patient is alert and oriented x3. She is moving all 4 extremities well. Her dressing is clean and dry.  Lab Results: No results found for this basename: WBC, HGB, HCT, PLT,  in the last 72 hours BMET No results found for this basename: NA, K, CL, CO2, GLUCOSE, BUN, CREATININE, CALCIUM,  in the last 72 hours  Studies/Results: Dg Cervical Spine 1 View  09/29/2013   CLINICAL DATA:  Instrument localization evaluation.  EXAM: DG CERVICAL SPINE - 1 VIEW  COMPARISON:  None.  FINDINGS: Tissue expanders identified within the posterior aspect of the neck. A hemostat tip projects at the level of the posterior elements of C2.  IMPRESSION: Instrument localization for C1-2 laminectomy   Electronically Signed   By: Salome Holmes M.D.   On: 09/29/2013 15:10    Assessment/Plan: Postop day #2: I will add a few doses of Decadron to see if we can help her with inflammation and her headache. She will likely go home tomorrow. I gave her her discharge instructions and answered all her questions.   LOS: 2 days     Cadee Agro D 10/01/2013, 7:40 AM

## 2013-10-02 MED ORDER — HYDROCODONE-ACETAMINOPHEN 5-325 MG PO TABS: 1.0000 | ORAL_TABLET | ORAL | Status: DC | PRN

## 2013-10-02 MED ORDER — DIAZEPAM 5 MG PO TABS: 5.0000 mg | ORAL_TABLET | Freq: Four times a day (QID) | ORAL | Status: DC | PRN

## 2013-10-02 NOTE — Discharge Summary (Signed)
Physician Discharge Summary  Patient ID: Morgan Kent MRN: [id number] DOB/AGE: 03/18/1981 32 y.o.  Admit date: 09/29/2013 Discharge date: 10/02/2013  Admission Diagnoses:Recurrent Chiari malformation and syringomyelia  Discharge Diagnoses: Recurrent Chiari malformation and syringomyelia Active Problems:   * No active hospital problems. *   Discharged Condition: good  Hospital Course: Underwent redo Chiari decompression and decompression of syringomyelia  Consults: None  Significant Diagnostic Studies: None  Treatments: surgery:Underwent redo Chiari decompression and decompression of syringomyelia  Discharge Exam: Blood pressure 131/91, pulse 96, temperature 98.3 F (36.8 C), temperature source Oral, resp. rate 19, height 5\' 6"  (1.676 m), weight 114.6 kg (252 lb 10.4 oz), SpO2 97.00%. Neurologic: Alert and oriented X 3, normal strength and tone. Normal symmetric reflexes. Normal coordination and gait Wound:CDI  Disposition: Home     Medication List         antiseptic oral rinse Liqd  15 mLs by Mouth Rinse route 3 (three) times daily as needed for dry mouth.     atenolol 25 MG tablet  Commonly known as:  TENORMIN  Take 25 mg by mouth daily.     clobetasol 0.05 % topical foam  Commonly known as:  OLUX  Apply 1 application topically daily. To arms and body     clobetasol 0.05 % external solution  Commonly known as:  TEMOVATE  Apply 1 application topically daily. To scalp     diazepam 5 MG tablet  Commonly known as:  VALIUM  Take 1 tablet (5 mg total) by mouth every 6 (six) hours as needed for muscle spasms.     fluticasone 50 MCG/ACT nasal spray  Commonly known as:  FLONASE  Place 1 spray into both nostrils daily.     gabapentin 300 MG capsule  Commonly known as:  NEURONTIN  Take 300 mg by mouth 3 (three) times daily.     hydrochlorothiazide 25 MG tablet  Commonly known as:  HYDRODIURIL  Take 25 mg by mouth daily.     HYDROcodone-acetaminophen 5-325 MG  per tablet  Commonly known as:  NORCO/VICODIN  Take 1-2 tablets by mouth every 4 (four) hours as needed for moderate pain or severe pain.     hydroxychloroquine 200 MG tablet  Commonly known as:  PLAQUENIL  Take 200 mg by mouth 2 (two) times daily.     montelukast 10 MG tablet  Commonly known as:  SINGULAIR  Take 10 mg by mouth at bedtime.     norethindrone 0.35 MG tablet  Commonly known as:  MICRONOR,CAMILA,ERRIN  Take 1 tablet by mouth daily.     nortriptyline 10 MG capsule  Commonly known as:  PAMELOR  Take 20 mg by mouth at bedtime.     sertraline 100 MG tablet  Commonly known as:  ZOLOFT  Take 100 mg by mouth 2 (two) times daily.     SYSTANE ULTRA OP  Place 1 drop into both eyes daily as needed (dry eyes).     tacrolimus 0.1 % ointment  Commonly known as:  PROTOPIC  Apply 1 application topically daily as needed (lupus flare ups).     triamcinolone cream 0.5 %  Commonly known as:  KENALOG  Apply 1 application topically daily as needed (lupus flare ups).         Signed: Dorian Heckle, MD 10/02/2013, 9:20 AM

## 2013-10-02 NOTE — Progress Notes (Signed)
Pt discharged home in stable condition to self care and to F/U with neurosurgeon Dr. Lovell Sheehan in 10 days. No activity restrictions. All questions answered.

## 2013-10-02 NOTE — Progress Notes (Signed)
Subjective: Patient reports feeling better after steroids  Objective: Vital signs in last 24 hours: Temp:  [97.9 F (36.6 C)-98.4 F (36.9 C)] 98.3 F (36.8 C) (11/22 0700) Pulse Rate:  [89-100] 96 (11/22 0700) Resp:  [18-20] 19 (11/22 0700) BP: (125-131)/(72-91) 131/91 mmHg (11/22 0700) SpO2:  [94 %-98 %] 97 % (11/22 0700)  Intake/Output from previous day:   Intake/Output this shift: Total I/O In: 720 [P.O.:720] Out: -   Physical Exam: Full strength both upper extremities.  Dressings CDI  Lab Results: No results found for this basename: WBC, HGB, HCT, PLT,  in the last 72 hours BMET No results found for this basename: NA, K, CL, CO2, GLUCOSE, BUN, CREATININE, CALCIUM,  in the last 72 hours  Studies/Results: No results found.  Assessment/Plan: OK to D/C home.  F/U with Dr. Lovell Sheehan in 10 days in office.    LOS: 3 days    Dorian Heckle, MD 10/02/2013, 9:13 AM

## 2013-10-05 ENCOUNTER — Encounter (HOSPITAL_COMMUNITY): Payer: Self-pay | Admitting: Neurosurgery

## 2013-12-13 ENCOUNTER — Encounter: Payer: Self-pay | Admitting: Neurosurgery

## 2014-01-09 ENCOUNTER — Encounter: Payer: Self-pay | Admitting: Neurosurgery

## 2014-02-14 DIAGNOSIS — M792 Neuralgia and neuritis, unspecified: Secondary | ICD-10-CM | POA: Insufficient documentation

## 2014-02-14 DIAGNOSIS — I10 Essential (primary) hypertension: Secondary | ICD-10-CM | POA: Insufficient documentation

## 2014-02-14 DIAGNOSIS — G5600 Carpal tunnel syndrome, unspecified upper limb: Secondary | ICD-10-CM | POA: Insufficient documentation

## 2014-02-14 DIAGNOSIS — F329 Major depressive disorder, single episode, unspecified: Secondary | ICD-10-CM | POA: Insufficient documentation

## 2014-02-14 DIAGNOSIS — M329 Systemic lupus erythematosus, unspecified: Secondary | ICD-10-CM | POA: Insufficient documentation

## 2014-02-14 DIAGNOSIS — G4730 Sleep apnea, unspecified: Secondary | ICD-10-CM | POA: Insufficient documentation

## 2014-02-14 DIAGNOSIS — F32A Depression, unspecified: Secondary | ICD-10-CM | POA: Insufficient documentation

## 2014-02-14 DIAGNOSIS — G43909 Migraine, unspecified, not intractable, without status migrainosus: Secondary | ICD-10-CM | POA: Insufficient documentation

## 2014-05-10 ENCOUNTER — Ambulatory Visit: Payer: Self-pay | Admitting: Neurosurgery

## 2014-05-30 ENCOUNTER — Other Ambulatory Visit: Payer: Self-pay | Admitting: Neurosurgery

## 2014-06-03 ENCOUNTER — Ambulatory Visit: Payer: Medicaid Other | Admitting: Neurology

## 2014-06-09 ENCOUNTER — Encounter (HOSPITAL_COMMUNITY): Payer: Self-pay | Admitting: Pharmacy Technician

## 2014-06-17 ENCOUNTER — Encounter (HOSPITAL_COMMUNITY): Payer: Self-pay

## 2014-06-17 ENCOUNTER — Encounter (HOSPITAL_COMMUNITY)
Admission: RE | Admit: 2014-06-17 | Discharge: 2014-06-17 | Disposition: A | Discharge status: Home / Self Care | Payer: Medicaid Other | Source: Ambulatory Visit | Attending: Neurosurgery | Admitting: Neurosurgery

## 2014-06-17 ENCOUNTER — Encounter (HOSPITAL_COMMUNITY): Admission: RE | Admit: 2014-06-17 | Payer: Medicaid Other | Source: Ambulatory Visit

## 2014-06-17 DIAGNOSIS — Z01818 Encounter for other preprocedural examination: Secondary | ICD-10-CM | POA: Diagnosis not present

## 2014-06-17 DIAGNOSIS — Z01812 Encounter for preprocedural laboratory examination: Secondary | ICD-10-CM | POA: Insufficient documentation

## 2014-06-17 LAB — CBC
HEMATOCRIT: 38.4 % (ref 36.0–46.0)
HEMOGLOBIN: 12.7 g/dL (ref 12.0–15.0)
MCH: 28.9 pg (ref 26.0–34.0)
MCHC: 33.1 g/dL (ref 30.0–36.0)
MCV: 87.3 fL (ref 78.0–100.0)
Platelets: 195 10*3/uL (ref 150–400)
RBC: 4.4 MIL/uL (ref 3.87–5.11)
RDW: 14 % (ref 11.5–15.5)
WBC: 6.4 10*3/uL (ref 4.0–10.5)

## 2014-06-17 LAB — BASIC METABOLIC PANEL
ANION GAP: 13 (ref 5–15)
BUN: 11 mg/dL (ref 6–23)
CHLORIDE: 100 meq/L (ref 96–112)
CO2: 26 meq/L (ref 19–32)
Calcium: 9.3 mg/dL (ref 8.4–10.5)
Creatinine, Ser: 0.77 mg/dL (ref 0.50–1.10)
GFR calc Af Amer: >90 mL/min (ref >90)
GFR calc non Af Amer: >90 mL/min (ref >90)
Glucose, Bld: 95 mg/dL (ref 70–99)
Potassium: 3.7 mEq/L (ref 3.7–5.3)
SODIUM: 139 meq/L (ref 137–147)

## 2014-06-17 LAB — TYPE AND SCREEN
ABO/RH(D): O POS
ANTIBODY SCREEN: NEGATIVE

## 2014-06-17 LAB — HCG, SERUM, QUALITATIVE: Preg, Serum: NEGATIVE

## 2014-06-17 LAB — SURGICAL PCR SCREEN
MRSA, PCR: NEGATIVE
STAPHYLOCOCCUS AUREUS: NEGATIVE

## 2014-06-17 NOTE — Progress Notes (Signed)
req'd sleep study from [place]

## 2014-06-17 NOTE — Pre-Procedure Instructions (Addendum)
Morgan Kent  06/17/2014   Your procedure is scheduled on:  06/23/14  Report to [facility] short stay admitting at **530 AM.  Call this number if you have problems the morning of surgery: [phone]   Remember:   Do not eat food or drink liquids after midnight.   Take these medicines the morning of surgery with A SIP OF WATER: atenolol, cymbalta ,gabapentin, plaquenil, pain med     Take all meds as ordered until day of surgery except as instructed below or per dr    Morgan Kent all herbel meds, nsaids (aleve,naproxen,advil,ibuprofen) tomorrow including vitamins, aspirin, meloxicam (mobic), voltaren    No creams after shower am of surgery    Do not wear jewelry, make-up or nail polish.  Do not wear lotions, powders, or perfumes. You may wear deodorant.  Do not shave 48 hours prior to surgery. Men may shave face and neck.  Do not bring valuables to the hospital.  [facility] is not responsible                  for any belongings or valuables.               Contacts, dentures or bridgework may not be worn into surgery.  Leave suitcase in the car. After surgery it may be brought to your room.  For patients admitted to the hospital, discharge time is determined by your                treatment team.               Patients discharged the day of surgery will not be allowed to drive  home.  Name and phone number of your driver:   Special Instructions:  Special Instructions: [place] - Preparing for Surgery  Before surgery, you can play an important role.  Because skin is not sterile, your skin needs to be as free of germs as possible.  You can reduce the number of germs on you skin by washing with CHG (chlorahexidine gluconate) soap before surgery.  CHG is an antiseptic cleaner which kills germs and bonds with the skin to continue killing germs even after washing.  Please DO NOT use if you have an allergy to CHG or antibacterial soaps.  If your skin becomes reddened/irritated stop using the  CHG and inform your nurse when you arrive at Short Stay.  Do not shave (including legs and underarms) for at least 48 hours prior to the first CHG shower.  You may shave your face.  Please follow these instructions carefully:   1.  Shower with CHG Soap the night before surgery and the morning of Surgery.  2.  If you choose to wash your hair, wash your hair first as usual with your normal shampoo.  3.  After you shampoo, rinse your hair and body thoroughly to remove the Shampoo.  4.  Use CHG as you would any other liquid soap.  You can apply chg directly  to the skin and wash gently with scrungie or a clean washcloth.  5.  Apply the CHG Soap to your body ONLY FROM THE NECK DOWN.  Do not use on open wounds or open sores.  Avoid contact with your eyes ears, mouth and genitals (private parts).  Wash genitals (private parts)       with your normal soap.  6.  Wash thoroughly, paying special attention to the area where your surgery will be performed.  7.  Thoroughly rinse your body with warm water from the neck down.  8.  DO NOT shower/wash with your normal soap after using and rinsing off the CHG Soap.  9.  Pat yourself dry with a clean towel.            10.  Wear clean pajamas.            11.  Place clean sheets on your bed the night of your first shower and do not sleep with pets.  Day of Surgery  Do not apply any lotions/deodorants the morning of surgery.  Please wear clean clothes to the hospital/surgery center.   Please read over the following fact sheets that you were given: Pain Booklet, Coughing and Deep Breathing, Blood Transfusion Information, MRSA Information and Surgical Site Infection Prevention

## 2014-06-22 MED ORDER — CEFAZOLIN SODIUM-DEXTROSE 2-3 GM-% IV SOLR
2.0000 g | INTRAVENOUS | Status: AC
  Administered 2014-06-23: 2 g via INTRAVENOUS
  Filled 2014-06-22: qty 50

## 2014-06-23 ENCOUNTER — Encounter (HOSPITAL_COMMUNITY)
Admission: RE | Disposition: A | Discharge status: Home / Self Care | Payer: Self-pay | Source: Ambulatory Visit | Attending: Neurosurgery

## 2014-06-23 ENCOUNTER — Inpatient Hospital Stay (HOSPITAL_COMMUNITY): Payer: Medicaid Other

## 2014-06-23 ENCOUNTER — Encounter (HOSPITAL_COMMUNITY): Payer: Medicaid Other | Admitting: Critical Care Medicine

## 2014-06-23 ENCOUNTER — Inpatient Hospital Stay (HOSPITAL_COMMUNITY): Payer: Medicaid Other | Admitting: Critical Care Medicine

## 2014-06-23 ENCOUNTER — Encounter (HOSPITAL_COMMUNITY): Payer: Self-pay | Admitting: Critical Care Medicine

## 2014-06-23 ENCOUNTER — Inpatient Hospital Stay (HOSPITAL_COMMUNITY)
Admission: RE | Admit: 2014-06-23 | Discharge: 2014-06-26 | DRG: 026 | Disposition: A | Discharge status: Home / Self Care | Payer: Medicaid Other | Source: Ambulatory Visit | Attending: Neurosurgery | Admitting: Neurosurgery

## 2014-06-23 DIAGNOSIS — F329 Major depressive disorder, single episode, unspecified: Secondary | ICD-10-CM | POA: Diagnosis present

## 2014-06-23 DIAGNOSIS — F411 Generalized anxiety disorder: Secondary | ICD-10-CM | POA: Diagnosis present

## 2014-06-23 DIAGNOSIS — I10 Essential (primary) hypertension: Secondary | ICD-10-CM | POA: Diagnosis present

## 2014-06-23 DIAGNOSIS — G935 Compression of brain: Secondary | ICD-10-CM | POA: Diagnosis present

## 2014-06-23 DIAGNOSIS — G950 Syringomyelia and syringobulbia: Secondary | ICD-10-CM | POA: Diagnosis present

## 2014-06-23 DIAGNOSIS — F3289 Other specified depressive episodes: Secondary | ICD-10-CM | POA: Diagnosis present

## 2014-06-23 DIAGNOSIS — Z79899 Other long term (current) drug therapy: Secondary | ICD-10-CM

## 2014-06-23 HISTORY — PX: SUBOCCIPITAL CRANIECTOMY CERVICAL LAMINECTOMY: SHX5404

## 2014-06-23 SURGERY — SUBOCCIPITAL CRANIECTOMY CERVICAL LAMINECTOMY/DURAPLASTY
Anesthesia: General

## 2014-06-23 MED ORDER — THROMBIN 20000 UNITS EX SOLR
CUTANEOUS | Status: DC | PRN
  Administered 2014-06-23 (×2): via TOPICAL

## 2014-06-23 MED ORDER — LIDOCAINE HCL (CARDIAC) 20 MG/ML IV SOLN
INTRAVENOUS | Status: AC
  Filled 2014-06-23: qty 5

## 2014-06-23 MED ORDER — FLUTICASONE PROPIONATE 50 MCG/ACT NA SUSP
1.0000 | Freq: Every day | NASAL | Status: DC
  Administered 2014-06-23 – 2014-06-25 (×2): 1 via NASAL
  Filled 2014-06-23 (×3): qty 16

## 2014-06-23 MED ORDER — ARTIFICIAL TEARS OP OINT
TOPICAL_OINTMENT | OPHTHALMIC | Status: AC
  Filled 2014-06-23: qty 3.5

## 2014-06-23 MED ORDER — NORTRIPTYLINE HCL 10 MG PO CAPS
40.0000 mg | ORAL_CAPSULE | Freq: Every day | ORAL | Status: DC
  Administered 2014-06-23 – 2014-06-25 (×3): 40 mg via ORAL
  Filled 2014-06-23 (×5): qty 4

## 2014-06-23 MED ORDER — DOCUSATE SODIUM 100 MG PO CAPS
100.0000 mg | ORAL_CAPSULE | Freq: Two times a day (BID) | ORAL | Status: DC
  Administered 2014-06-23 – 2014-06-26 (×6): 100 mg via ORAL
  Filled 2014-06-23 (×8): qty 1

## 2014-06-23 MED ORDER — PHENYLEPHRINE HCL 10 MG/ML IJ SOLN
INTRAMUSCULAR | Status: DC | PRN
  Administered 2014-06-23 (×2): 40 ug via INTRAVENOUS
  Administered 2014-06-23: 80 ug via INTRAVENOUS
  Administered 2014-06-23: 40 ug via INTRAVENOUS
  Administered 2014-06-23: 80 ug via INTRAVENOUS

## 2014-06-23 MED ORDER — VECURONIUM BROMIDE 10 MG IV SOLR
INTRAVENOUS | Status: AC
  Filled 2014-06-23: qty 10

## 2014-06-23 MED ORDER — MONTELUKAST SODIUM 10 MG PO TABS
10.0000 mg | ORAL_TABLET | Freq: Every day | ORAL | Status: DC
  Administered 2014-06-23 – 2014-06-25 (×3): 10 mg via ORAL
  Filled 2014-06-23 (×4): qty 1

## 2014-06-23 MED ORDER — MIDAZOLAM HCL 5 MG/5ML IJ SOLN
INTRAMUSCULAR | Status: DC | PRN
  Administered 2014-06-23: 2 mg via INTRAVENOUS

## 2014-06-23 MED ORDER — ADULT MULTIVITAMIN W/MINERALS CH
1.0000 | ORAL_TABLET | Freq: Every day | ORAL | Status: DC
  Administered 2014-06-24 – 2014-06-26 (×3): 1 via ORAL
  Filled 2014-06-23 (×4): qty 1

## 2014-06-23 MED ORDER — OXYCODONE-ACETAMINOPHEN 5-325 MG PO TABS
1.0000 | ORAL_TABLET | ORAL | Status: DC | PRN
  Administered 2014-06-24 – 2014-06-26 (×12): 2 via ORAL
  Filled 2014-06-23 (×12): qty 2

## 2014-06-23 MED ORDER — MIDAZOLAM HCL 2 MG/2ML IJ SOLN
INTRAMUSCULAR | Status: AC
  Filled 2014-06-23: qty 2

## 2014-06-23 MED ORDER — OXYCODONE HCL 5 MG/5ML PO SOLN: 5.0000 mg | Freq: Once | ORAL | Status: DC | PRN

## 2014-06-23 MED ORDER — BUPIVACAINE-EPINEPHRINE 0.5% -1:200000 IJ SOLN
INTRAMUSCULAR | Status: DC | PRN
  Administered 2014-06-23: 10 mL

## 2014-06-23 MED ORDER — FENTANYL CITRATE 0.05 MG/ML IJ SOLN
INTRAMUSCULAR | Status: AC
  Filled 2014-06-23: qty 5

## 2014-06-23 MED ORDER — HYDROMORPHONE HCL PF 1 MG/ML IJ SOLN
0.2500 mg | INTRAMUSCULAR | Status: DC | PRN
  Administered 2014-06-23: 0.5 mg via INTRAVENOUS

## 2014-06-23 MED ORDER — SUCCINYLCHOLINE CHLORIDE 20 MG/ML IJ SOLN
INTRAMUSCULAR | Status: AC
  Filled 2014-06-23: qty 1

## 2014-06-23 MED ORDER — ONDANSETRON HCL 4 MG/2ML IJ SOLN
INTRAMUSCULAR | Status: DC | PRN
  Administered 2014-06-23 (×2): 4 mg via INTRAVENOUS

## 2014-06-23 MED ORDER — LIDOCAINE HCL (CARDIAC) 20 MG/ML IV SOLN
INTRAVENOUS | Status: DC | PRN
  Administered 2014-06-23: 80 mg via INTRAVENOUS

## 2014-06-23 MED ORDER — MORPHINE SULFATE 2 MG/ML IJ SOLN
1.0000 mg | INTRAMUSCULAR | Status: DC | PRN
  Administered 2014-06-23 – 2014-06-25 (×12): 2 mg via INTRAVENOUS
  Filled 2014-06-23 (×12): qty 1

## 2014-06-23 MED ORDER — ATENOLOL 25 MG PO TABS
25.0000 mg | ORAL_TABLET | Freq: Every day | ORAL | Status: DC
  Administered 2014-06-24 – 2014-06-26 (×3): 25 mg via ORAL
  Filled 2014-06-23 (×4): qty 1

## 2014-06-23 MED ORDER — ACETAMINOPHEN 650 MG RE SUPP: 650.0000 mg | RECTAL | Status: DC | PRN

## 2014-06-23 MED ORDER — LORATADINE 10 MG PO TABS: 10.0000 mg | ORAL_TABLET | Freq: Every day | ORAL | Status: DC

## 2014-06-23 MED ORDER — GLYCOPYRROLATE 0.2 MG/ML IJ SOLN
INTRAMUSCULAR | Status: AC
  Filled 2014-06-23: qty 3

## 2014-06-23 MED ORDER — DIAZEPAM 5 MG PO TABS
5.0000 mg | ORAL_TABLET | Freq: Four times a day (QID) | ORAL | Status: DC | PRN
  Administered 2014-06-23: 5 mg via ORAL
  Filled 2014-06-23: qty 1

## 2014-06-23 MED ORDER — PROPOFOL 10 MG/ML IV BOLUS
INTRAVENOUS | Status: AC
  Filled 2014-06-23: qty 20

## 2014-06-23 MED ORDER — ATENOLOL 25 MG PO TABS: 25.0000 mg | ORAL_TABLET | Freq: Every day | ORAL | Status: DC

## 2014-06-23 MED ORDER — ONDANSETRON HCL 4 MG/2ML IJ SOLN
INTRAMUSCULAR | Status: AC
  Filled 2014-06-23: qty 2

## 2014-06-23 MED ORDER — STERILE WATER FOR INJECTION IJ SOLN
INTRAMUSCULAR | Status: AC
  Filled 2014-06-23: qty 10

## 2014-06-23 MED ORDER — NEOSTIGMINE METHYLSULFATE 10 MG/10ML IV SOLN
INTRAVENOUS | Status: AC
  Filled 2014-06-23: qty 1

## 2014-06-23 MED ORDER — PHENYLEPHRINE 40 MCG/ML (10ML) SYRINGE FOR IV PUSH (FOR BLOOD PRESSURE SUPPORT)
PREFILLED_SYRINGE | INTRAVENOUS | Status: AC
  Filled 2014-06-23: qty 10

## 2014-06-23 MED ORDER — POLYVINYL ALCOHOL 1.4 % OP SOLN
1.0000 [drp] | Freq: Two times a day (BID) | OPHTHALMIC | Status: DC | PRN
  Administered 2014-06-23 (×2): 1 [drp] via OPHTHALMIC
  Filled 2014-06-23 (×2): qty 15

## 2014-06-23 MED ORDER — GLYCOPYRROLATE 0.2 MG/ML IJ SOLN
INTRAMUSCULAR | Status: DC | PRN
  Administered 2014-06-23: 0.6 mg via INTRAVENOUS

## 2014-06-23 MED ORDER — DULOXETINE HCL 60 MG PO CPEP
60.0000 mg | ORAL_CAPSULE | Freq: Every day | ORAL | Status: DC
  Administered 2014-06-24 – 2014-06-26 (×3): 60 mg via ORAL
  Filled 2014-06-23 (×4): qty 1

## 2014-06-23 MED ORDER — HYDROCODONE-ACETAMINOPHEN 5-325 MG PO TABS
1.0000 | ORAL_TABLET | ORAL | Status: DC | PRN
  Administered 2014-06-23 – 2014-06-24 (×5): 2 via ORAL
  Filled 2014-06-23 (×6): qty 2

## 2014-06-23 MED ORDER — ACETAMINOPHEN 325 MG PO TABS: 650.0000 mg | ORAL_TABLET | ORAL | Status: DC | PRN

## 2014-06-23 MED ORDER — OXYCODONE HCL 5 MG PO TABS: 5.0000 mg | ORAL_TABLET | Freq: Once | ORAL | Status: DC | PRN

## 2014-06-23 MED ORDER — HYDROCHLOROTHIAZIDE 25 MG PO TABS
25.0000 mg | ORAL_TABLET | Freq: Every day | ORAL | Status: DC
  Administered 2014-06-24 – 2014-06-26 (×3): 25 mg via ORAL
  Filled 2014-06-23 (×4): qty 1

## 2014-06-23 MED ORDER — LACTATED RINGERS IV SOLN
INTRAVENOUS | Status: DC
  Administered 2014-06-23 (×2): via INTRAVENOUS

## 2014-06-23 MED ORDER — ADULT MULTIVITAMIN W/MINERALS CH: 1.0000 | ORAL_TABLET | Freq: Every day | ORAL | Status: DC

## 2014-06-23 MED ORDER — MELOXICAM 7.5 MG PO TABS
7.5000 mg | ORAL_TABLET | Freq: Two times a day (BID) | ORAL | Status: DC | PRN
  Filled 2014-06-23: qty 1

## 2014-06-23 MED ORDER — 0.9 % SODIUM CHLORIDE (POUR BTL) OPTIME
TOPICAL | Status: DC | PRN
  Administered 2014-06-23 (×2): 1000 mL

## 2014-06-23 MED ORDER — DIPHENHYDRAMINE HCL 50 MG/ML IJ SOLN
INTRAMUSCULAR | Status: AC
  Filled 2014-06-23: qty 1

## 2014-06-23 MED ORDER — FENTANYL CITRATE 0.05 MG/ML IJ SOLN
INTRAMUSCULAR | Status: DC | PRN
  Administered 2014-06-23 (×2): 50 ug via INTRAVENOUS
  Administered 2014-06-23: 100 ug via INTRAVENOUS
  Administered 2014-06-23 (×6): 50 ug via INTRAVENOUS

## 2014-06-23 MED ORDER — DULOXETINE HCL 60 MG PO CPEP: 60.0000 mg | ORAL_CAPSULE | Freq: Every day | ORAL | Status: DC

## 2014-06-23 MED ORDER — DEXAMETHASONE SODIUM PHOSPHATE 10 MG/ML IJ SOLN
INTRAMUSCULAR | Status: AC
  Filled 2014-06-23: qty 1

## 2014-06-23 MED ORDER — HYDROCHLOROTHIAZIDE 25 MG PO TABS: 25.0000 mg | ORAL_TABLET | Freq: Every day | ORAL | Status: DC

## 2014-06-23 MED ORDER — CEFAZOLIN SODIUM-DEXTROSE 2-3 GM-% IV SOLR
2.0000 g | Freq: Three times a day (TID) | INTRAVENOUS | Status: AC
  Administered 2014-06-23 – 2014-06-24 (×2): 2 g via INTRAVENOUS
  Filled 2014-06-23 (×2): qty 50

## 2014-06-23 MED ORDER — ROCURONIUM BROMIDE 100 MG/10ML IV SOLN
INTRAVENOUS | Status: DC | PRN
  Administered 2014-06-23: 50 mg via INTRAVENOUS

## 2014-06-23 MED ORDER — ONDANSETRON HCL 4 MG/2ML IJ SOLN: 4.0000 mg | INTRAMUSCULAR | Status: DC | PRN

## 2014-06-23 MED ORDER — SODIUM CHLORIDE 0.9 % IJ SOLN
INTRAMUSCULAR | Status: AC
  Filled 2014-06-23: qty 10

## 2014-06-23 MED ORDER — DEXAMETHASONE SODIUM PHOSPHATE 10 MG/ML IJ SOLN
INTRAMUSCULAR | Status: DC | PRN
  Administered 2014-06-23: 10 mg via INTRAVENOUS

## 2014-06-23 MED ORDER — ARTIFICIAL TEARS OP OINT
TOPICAL_OINTMENT | OPHTHALMIC | Status: DC | PRN
  Administered 2014-06-23: 1 via OPHTHALMIC

## 2014-06-23 MED ORDER — LORATADINE 10 MG PO TABS
10.0000 mg | ORAL_TABLET | Freq: Every day | ORAL | Status: DC
  Administered 2014-06-24 – 2014-06-26 (×3): 10 mg via ORAL
  Filled 2014-06-23 (×5): qty 1

## 2014-06-23 MED ORDER — NORETHINDRONE 0.35 MG PO TABS
1.0000 | ORAL_TABLET | Freq: Every day | ORAL | Status: DC
  Administered 2014-06-25 – 2014-06-26 (×2): 0.35 mg via ORAL

## 2014-06-23 MED ORDER — PROPOFOL 10 MG/ML IV BOLUS
INTRAVENOUS | Status: DC | PRN
  Administered 2014-06-23: 100 mg via INTRAVENOUS
  Administered 2014-06-23: 200 mg via INTRAVENOUS

## 2014-06-23 MED ORDER — VECURONIUM BROMIDE 10 MG IV SOLR
INTRAVENOUS | Status: DC | PRN
  Administered 2014-06-23 (×2): 1 mg via INTRAVENOUS
  Administered 2014-06-23: 2 mg via INTRAVENOUS

## 2014-06-23 MED ORDER — ALUM & MAG HYDROXIDE-SIMETH 200-200-20 MG/5ML PO SUSP: 30.0000 mL | Freq: Four times a day (QID) | ORAL | Status: DC | PRN

## 2014-06-23 MED ORDER — GABAPENTIN 300 MG PO CAPS
300.0000 mg | ORAL_CAPSULE | Freq: Three times a day (TID) | ORAL | Status: DC
  Administered 2014-06-23 – 2014-06-26 (×9): 300 mg via ORAL
  Filled 2014-06-23 (×3): qty 1
  Filled 2014-06-23: qty 3
  Filled 2014-06-23 (×8): qty 1

## 2014-06-23 MED ORDER — PROMETHAZINE HCL 25 MG/ML IJ SOLN: 6.2500 mg | INTRAMUSCULAR | Status: DC | PRN

## 2014-06-23 MED ORDER — EPHEDRINE SULFATE 50 MG/ML IJ SOLN
INTRAMUSCULAR | Status: AC
  Filled 2014-06-23: qty 1

## 2014-06-23 MED ORDER — BACITRACIN ZINC 500 UNIT/GM EX OINT
TOPICAL_OINTMENT | CUTANEOUS | Status: DC | PRN
  Administered 2014-06-23: 1 via TOPICAL

## 2014-06-23 MED ORDER — LACTATED RINGERS IV SOLN
INTRAVENOUS | Status: DC
  Administered 2014-06-24: 75 mL/h via INTRAVENOUS

## 2014-06-23 MED ORDER — NEOSTIGMINE METHYLSULFATE 10 MG/10ML IV SOLN
INTRAVENOUS | Status: DC | PRN
  Administered 2014-06-23: 4 mg via INTRAVENOUS

## 2014-06-23 MED ORDER — MENTHOL 3 MG MT LOZG: 1.0000 | LOZENGE | OROMUCOSAL | Status: DC | PRN

## 2014-06-23 MED ORDER — OXYCODONE-ACETAMINOPHEN 10-325 MG PO TABS: 1.0000 | ORAL_TABLET | ORAL | Status: DC | PRN

## 2014-06-23 MED ORDER — PHENOL 1.4 % MT LIQD: 1.0000 | OROMUCOSAL | Status: DC | PRN

## 2014-06-23 MED ORDER — HYDROMORPHONE HCL PF 1 MG/ML IJ SOLN
INTRAMUSCULAR | Status: AC
  Filled 2014-06-23: qty 1

## 2014-06-23 MED ORDER — TRAZODONE HCL 50 MG PO TABS: 12.5000 mg | ORAL_TABLET | Freq: Every day | ORAL | Status: DC

## 2014-06-23 MED ORDER — ROCURONIUM BROMIDE 50 MG/5ML IV SOLN
INTRAVENOUS | Status: AC
  Filled 2014-06-23: qty 1

## 2014-06-23 SURGICAL SUPPLY — 81 items
BAG DECANTER FOR FLEXI CONT (MISCELLANEOUS) ×3 IMPLANT
BENZOIN TINCTURE PRP APPL 2/3 (GAUZE/BANDAGES/DRESSINGS) ×3 IMPLANT
BLADE CLIPPER SURG NEURO (BLADE) ×6 IMPLANT
BLADE SURG 15 STRL LF DISP TIS (BLADE) ×2 IMPLANT
BLADE SURG 15 STRL SS (BLADE) ×4
BLADE ULTRA TIP 2M (BLADE) ×3 IMPLANT
BRUSH SCRUB EZ 1% IODOPHOR (MISCELLANEOUS) IMPLANT
BRUSH SCRUB EZ PLAIN DRY (MISCELLANEOUS) ×3 IMPLANT
BUR ACORN 6.0 PRECISION (BURR) ×2 IMPLANT
BUR ACORN 6.0MM PRECISION (BURR) ×1
BUR MATCHSTICK NEURO 3.0 LAGG (BURR) ×3 IMPLANT
CANISTER SUCT 3000ML (MISCELLANEOUS) ×3 IMPLANT
CLIP TI MEDIUM 6 (CLIP) IMPLANT
CLOSURE WOUND 1/2 X4 (GAUZE/BANDAGES/DRESSINGS) ×1
CONT SPEC 4OZ CLIKSEAL STRL BL (MISCELLANEOUS) ×3 IMPLANT
CORDS BIPOLAR (ELECTRODE) ×3 IMPLANT
COVER MAYO STAND STRL (DRAPES) ×3 IMPLANT
DRAIN SNY WOU 7FLT (WOUND CARE) IMPLANT
DRAPE LAPAROTOMY 100X72 PEDS (DRAPES) ×3 IMPLANT
DRAPE MICROSCOPE LEICA (MISCELLANEOUS) IMPLANT
DRAPE SURG 17X23 STRL (DRAPES) ×6 IMPLANT
DRAPE WARM FLUID 44X44 (DRAPE) ×3 IMPLANT
DRSG TELFA 3X8 NADH (GAUZE/BANDAGES/DRESSINGS) ×3 IMPLANT
DURAGUARD 04CMX04CM ×3 IMPLANT
DURASEAL APPLICATOR TIP (TIP) ×3 IMPLANT
DURASEAL SPINE SEALANT 3ML (MISCELLANEOUS) ×3 IMPLANT
ELECT BLADE 4.0 EZ CLEAN MEGAD (MISCELLANEOUS) ×3
ELECT CAUTERY BLADE 6.4 (BLADE) ×3 IMPLANT
ELECT REM PT RETURN 9FT ADLT (ELECTROSURGICAL) ×3
ELECTRODE BLDE 4.0 EZ CLN MEGD (MISCELLANEOUS) ×1 IMPLANT
ELECTRODE REM PT RTRN 9FT ADLT (ELECTROSURGICAL) ×1 IMPLANT
EVACUATOR 1/8 PVC DRAIN (DRAIN) IMPLANT
EVACUATOR SILICONE 100CC (DRAIN) IMPLANT
GAUZE SPONGE 4X4 12PLY STRL (GAUZE/BANDAGES/DRESSINGS) IMPLANT
GAUZE SPONGE 4X4 16PLY XRAY LF (GAUZE/BANDAGES/DRESSINGS) IMPLANT
GLOVE BIO SURGEON STRL SZ8.5 (GLOVE) ×3 IMPLANT
GLOVE BIOGEL PI IND STRL 7.5 (GLOVE) ×1 IMPLANT
GLOVE BIOGEL PI INDICATOR 7.5 (GLOVE) ×2
GLOVE ECLIPSE 8.0 STRL XLNG CF (GLOVE) ×3 IMPLANT
GLOVE EXAM NITRILE LRG STRL (GLOVE) IMPLANT
GLOVE EXAM NITRILE MD LF STRL (GLOVE) ×3 IMPLANT
GLOVE EXAM NITRILE XL STR (GLOVE) IMPLANT
GLOVE EXAM NITRILE XS STR PU (GLOVE) IMPLANT
GLOVE SS BIOGEL STRL SZ 8 (GLOVE) ×1 IMPLANT
GLOVE SUPERSENSE BIOGEL SZ 8 (GLOVE) ×2
GLOVE SURG SS PI 7.0 STRL IVOR (GLOVE) ×6 IMPLANT
GOWN STRL REUS W/ TWL LRG LVL3 (GOWN DISPOSABLE) ×1 IMPLANT
GOWN STRL REUS W/ TWL XL LVL3 (GOWN DISPOSABLE) ×2 IMPLANT
GOWN STRL REUS W/TWL LRG LVL3 (GOWN DISPOSABLE) ×2
GOWN STRL REUS W/TWL XL LVL3 (GOWN DISPOSABLE) ×4
KIT BASIN OR (CUSTOM PROCEDURE TRAY) ×3 IMPLANT
KIT ROOM TURNOVER OR (KITS) ×3 IMPLANT
NEEDLE HYPO 22GX1.5 SAFETY (NEEDLE) ×3 IMPLANT
NS IRRIG 1000ML POUR BTL (IV SOLUTION) ×3 IMPLANT
PACK CRANIOTOMY (CUSTOM PROCEDURE TRAY) ×3 IMPLANT
PAD ARMBOARD 7.5X6 YLW CONV (MISCELLANEOUS) ×9 IMPLANT
PATTIES SURGICAL 1/4 X 3 (GAUZE/BANDAGES/DRESSINGS) IMPLANT
PIN MAYFIELD SKULL DISP (PIN) ×3 IMPLANT
RUBBERBAND STERILE (MISCELLANEOUS) IMPLANT
SPONGE LAP 4X18 X RAY DECT (DISPOSABLE) IMPLANT
SPONGE SURGIFOAM ABS GEL 100 (HEMOSTASIS) ×3 IMPLANT
STAPLER SKIN PROX WIDE 3.9 (STAPLE) IMPLANT
STRIP CLOSURE SKIN 1/2X4 (GAUZE/BANDAGES/DRESSINGS) ×2 IMPLANT
SUT ETHILON 2 0 FS 18 (SUTURE) IMPLANT
SUT ETHILON 2 0 PSLX (SUTURE) ×6 IMPLANT
SUT ETHILON 3 0 FSL (SUTURE) IMPLANT
SUT NURALON 4 0 TR CR/8 (SUTURE) ×6 IMPLANT
SUT PROLENE 5 0 CC1 (SUTURE) ×3 IMPLANT
SUT PROLENE 6 0 BV (SUTURE) ×18 IMPLANT
SUT VIC AB 1 CT1 18XBRD ANBCTR (SUTURE) ×2 IMPLANT
SUT VIC AB 1 CT1 8-18 (SUTURE) ×4
SUT VIC AB 2-0 CP2 18 (SUTURE) ×9 IMPLANT
SUT VIC AB 3-0 SH 8-18 (SUTURE) ×3 IMPLANT
SYR 20ML ECCENTRIC (SYRINGE) ×3 IMPLANT
SYR CONTROL 10ML LL (SYRINGE) ×3 IMPLANT
TAPE CLOTH SURG 4X10 WHT LF (GAUZE/BANDAGES/DRESSINGS) ×3 IMPLANT
TOWEL OR 17X24 6PK STRL BLUE (TOWEL DISPOSABLE) IMPLANT
TOWEL OR 17X26 10 PK STRL BLUE (TOWEL DISPOSABLE) ×3 IMPLANT
TRAY FOLEY CATH 14FRSI W/METER (CATHETERS) IMPLANT
UNDERPAD 30X30 INCONTINENT (UNDERPADS AND DIAPERS) IMPLANT
WATER STERILE IRR 1000ML POUR (IV SOLUTION) ×3 IMPLANT

## 2014-06-23 NOTE — H&P (Signed)
Subjective: The patient is a 33 year old black female who has presented with a Chiari 1 malformation with syringomyelia. I performed a Chiari decompression years ago. A followup scan demonstrated good resolution of her Chiari malformation and resolution of her syringomyelia. The patient did well for years but then developed recurrent symptoms. A followup scan demonstrated a recurrence of her Chiari malformation with worsening syringomyelia. I performed a repeat Chiari decompression a few months ago. A followup scan demonstrated the patient had a persistent area of stenosis at the occipital cervical junction and persistent syringomyelia. I discussed situation with the patient and recommended that we perform another Chiari decompression and duraplasty. The patient has weighed the risks, benefits, and alternatives surgery and decided proceed with the operation.  Past Medical History  Diagnosis Date  . PONV (postoperative nausea and vomiting)   . Hypertension   . Depression   . Anxiety   . History of kidney stones   . Headache(784.0)   . Dizziness   . Arthritis     rheumo possible  . Lupus   . Chiari malformation     Past Surgical History  Procedure Laterality Date  . Craniotomy  14,06    chiari  . Carpel tunnel  Left 2014  . Dental surgery    . Hernia repair      umbilical  . Suboccipital craniectomy cervical laminectomy N/A 09/29/2013    Procedure: SUBOCCIPITAL CRANIECTOMY CERVICAL LAMINECTOMY/DURAPLASTY;  Surgeon: Ophelia Charter, MD;  Location: [facility];  Service: Neurosurgery;  Laterality: N/A;  posterior    No Known Allergies  History  Substance Use Topics  . Smoking status: Never Smoker   . Smokeless tobacco: Never Used  . Alcohol Use: No    History reviewed. No pertinent family history. Prior to Admission medications   Medication Sig Start Date End Date Taking? Authorizing Provider  adapalene (DIFFERIN) 0.1 % gel Apply 1 application topically at bedtime. Apply to  forehead and chin   Yes Historical Provider, MD  antiseptic oral rinse (BIOTENE) LIQD 15 mLs by Mouth Rinse route 2 (two) times daily.    Yes Historical Provider, MD  atenolol (TENORMIN) 25 MG tablet Take 25 mg by mouth daily.   Yes Historical Provider, MD  cetirizine (ZYRTEC) 10 MG tablet Take 10 mg by mouth daily.   Yes Historical Provider, MD  clobetasol (OLUX) 0.05 % topical foam Apply 1 application topically at bedtime. Apply all over body   Yes Historical Provider, MD  clobetasol (TEMOVATE) 0.05 % external solution Apply 1 application topically 2 (two) times daily. Apply to scalp   Yes Historical Provider, MD  clobetasol cream (TEMOVATE) 2.40 % Apply 1 application topically 2 (two) times daily as needed (for lupus flare ups). Apply to face   Yes Historical Provider, MD  desonide (DESOWEN) 0.05 % cream Apply 1 application topically every other day. Apply all over body   Yes Historical Provider, MD  diclofenac sodium (VOLTAREN) 1 % GEL Apply 1 application topically 2 (two) times daily.   Yes Historical Provider, MD  DULoxetine (CYMBALTA) 60 MG capsule Take 60 mg by mouth daily.   Yes Historical Provider, MD  fluticasone (FLONASE) 50 MCG/ACT nasal spray Place 1 spray into both nostrils at bedtime.    Yes Historical Provider, MD  gabapentin (NEURONTIN) 300 MG capsule Take 300 mg by mouth 3 (three) times daily.   Yes Historical Provider, MD  hydrochlorothiazide (HYDRODIURIL) 25 MG tablet Take 25 mg by mouth daily.   Yes Historical Provider, MD  HYDROcodone-acetaminophen (NORCO) 10-325 MG per tablet Take 1 tablet by mouth every 6 (six) hours as needed.   Yes Historical Provider, MD  hydroxychloroquine (PLAQUENIL) 200 MG tablet Take 200 mg by mouth 2 (two) times daily.   Yes Historical Provider, MD  meloxicam (MOBIC) 7.5 MG tablet Take 7.5 mg by mouth 2 (two) times daily as needed (joint pain).   Yes Historical Provider, MD  montelukast (SINGULAIR) 10 MG tablet Take 10 mg by mouth at bedtime.   Yes  Historical Provider, MD  Multiple Vitamin (MULTIVITAMIN WITH MINERALS) TABS tablet Take 1 tablet by mouth daily.   Yes Historical Provider, MD  norethindrone (SHAROBEL) 0.35 MG tablet Take 1 tablet by mouth daily.   Yes Historical Provider, MD  nortriptyline (PAMELOR) 10 MG capsule Take 40 mg by mouth at bedtime.   Yes Historical Provider, MD  oxyCODONE-acetaminophen (PERCOCET) 10-325 MG per tablet Take 1 tablet by mouth every 8 (eight) hours as needed for pain.   Yes Historical Provider, MD  polyvinyl alcohol (ARTIFICIAL TEARS) 1.4 % ophthalmic solution Place 1 drop into both eyes 2 (two) times daily.   Yes Historical Provider, MD  tacrolimus (PROTOPIC) 0.1 % ointment Apply 1 application topically See admin instructions. Apply every night at bedtime for 14 days and then stop for 14 days and then repeat (for lupus flare ups)   Yes Historical Provider, MD  traZODone (DESYREL) 50 MG tablet Take 12.5 mg by mouth at bedtime. 1/4 tablet   Yes Historical Provider, MD     Review of Systems  Positive ROS: As above  All other systems have been reviewed and were otherwise negative with the exception of those mentioned in the HPI and as above.  Objective: Vital signs in last 24 hours: Temp:  [98.3 F (36.8 C)] 98.3 F (36.8 C) (08/13 0748) Pulse Rate:  [86] 86 (08/13 0748) Resp:  [20] 20 (08/13 0748) BP: (126)/(91) 126/91 mmHg (08/13 0748) SpO2:  [100 %] 100 % (08/13 0748)  General Appearance: Alert, cooperative, no distress, Head: Normocephalic, without obvious abnormality, atraumatic Eyes: PERRL, conjunctiva/corneas clear, EOM's intact,    Ears: Normal  Throat: Normal  Neck: Supple, symmetrical, trachea midline, no adenopathy; thyroid: No enlargement/tenderness/nodules; no carotid bruit or JVD. The patient's cervical incision is well-healed. Back: Symmetric, no curvature, ROM normal, no CVA tenderness Lungs: Clear to auscultation bilaterally, respirations unlabored Heart: Regular rate and  rhythm, no murmur, rub or gallop Abdomen: Soft, non-tender,, no masses, no organomegaly Extremities: Extremities normal, atraumatic, no cyanosis or edema Pulses: 2+ and symmetric all extremities Skin: Skin color, texture, turgor normal, no rashes or lesions  NEUROLOGIC:   Mental status: alert and oriented, no aphasia, good attention span, Fund of knowledge/ memory ok Motor Exam - grossly normal Sensory Exam - grossly normal Reflexes:  Coordination - grossly normal Gait - grossly normal Balance - grossly normal Cranial Nerves: I: smell Not tested  II: visual acuity  OS: Normal  OD: Normal   II: visual fields Full to confrontation  II: pupils Equal, round, reactive to light  III,VII: ptosis None  III,IV,VI: extraocular muscles  Full ROM  V: mastication Normal  V: facial light touch sensation  Normal  V,VII: corneal reflex  Present  VII: facial muscle function - upper  Normal  VII: facial muscle function - lower Normal  VIII: hearing Not tested  IX: soft palate elevation  Normal  IX,X: gag reflex Present  XI: trapezius strength  5/5  XI: sternocleidomastoid strength 5/5  XI: neck flexion  strength  5/5  XII: tongue strength  Normal    Data Review Lab Results  Component Value Date   WBC 6.4 06/17/2014   HGB 12.7 06/17/2014   HCT 38.4 06/17/2014   MCV 87.3 06/17/2014   PLT 195 06/17/2014   Lab Results  Component Value Date   NA 139 06/17/2014   K 3.7 06/17/2014   CL 100 06/17/2014   CO2 26 06/17/2014   BUN 11 06/17/2014   CREATININE 0.77 06/17/2014   GLUCOSE 95 06/17/2014   No results found for this basename: INR, PROTIME    Assessment/Plan: Chiari 1 malformation, syringomyelia: I have discussed the situation with the patient. I have reviewed her most recent MRI with her and pointed out the abnormalities. We have discussed the various treatment options including a repeat Chiari decompression and duraplasty. I described the surgery to her. I have shown her surgical models. We have  discussed the risks, benefits, alternatives, and likelihood of achieving our goals with surgery. I have answered all the patient's questions. She has decided to proceed with surgery.   Haik Mahoney D 06/23/2014 9:14 AM

## 2014-06-23 NOTE — Anesthesia Procedure Notes (Signed)
Procedure Name: Intubation Date/Time: 06/23/2014 9:25 AM Performed by: Carola Frost Pre-anesthesia Checklist: Patient identified, Timeout performed, Emergency Drugs available, Suction available and Patient being monitored Patient Re-evaluated:Patient Re-evaluated prior to inductionOxygen Delivery Method: Circle system utilized Preoxygenation: Pre-oxygenation with 100% oxygen Intubation Type: IV induction and Cricoid Pressure applied Ventilation: Mask ventilation without difficulty Laryngoscope Size: Mac and 3 Grade View: Grade III Tube type: Oral Tube size: 7.5 mm Number of attempts: 1 Airway Equipment and Method: Stylet Placement Confirmation: CO2 detector,  positive ETCO2,  ETT inserted through vocal cords under direct vision and breath sounds checked- equal and bilateral Secured at: 22 cm Tube secured with: Tape Dental Injury: Teeth and Oropharynx as per pre-operative assessment

## 2014-06-23 NOTE — Transfer of Care (Signed)
Immediate Anesthesia Transfer of Care Note  Patient: Morgan Kent  Procedure(s) Performed: Procedure(s) with comments: SUBOCCIPITAL CRANIECTOMY CERVICAL LAMINECTOMY/DURAPLASTY (N/A) - suboccipital craniectomy with cervical laminectomy and duraplasty  Patient Location: PACU  Anesthesia Type:General  Level of Consciousness: awake, alert  and oriented  Airway & Oxygen Therapy: Patient Spontanous Breathing and Patient connected to nasal cannula oxygen  Post-op Assessment: Report given to PACU RN, Post -op Vital signs reviewed and stable and Patient moving all extremities X 4  Post vital signs: Reviewed and stable  Complications: No apparent anesthesia complications

## 2014-06-23 NOTE — Progress Notes (Signed)
PT Cancellation Note  Patient Details Name: Morgan Kent MRN: [id number] DOB: [birth year]   Cancelled Treatment:    Reason Eval/Treat Not Completed: Other (comment) (OR today).  Orders for OOB POD#1.  Pt was in surgery today.  PT to check on tomorrow.    Thanks,    Barbarann Ehlers. [place], [place], DPT [phone]   06/23/2014, 3:16 PM

## 2014-06-23 NOTE — Progress Notes (Signed)
Patient ID: Morgan Kent, female   DOB: May 03, 1981, 33 y.o.   MRN: [id number] Subjective:  The patient is alert and pleasant. She looks well. She is in no apparent distress.  Objective: Vital signs in last 24 hours: Temp:  [97.4 F (36.3 C)-98.3 F (36.8 C)] 97.4 F (36.3 C) (08/13 1600) Pulse Rate:  [86-109] 103 (08/13 1600) Resp:  [13-26] 14 (08/13 1600) BP: (113-141)/(47-91) 133/78 mmHg (08/13 1600) SpO2:  [95 %-100 %] 95 % (08/13 1600) Weight:  [116.3 kg (256 lb 6.3 oz)] 116.3 kg (256 lb 6.3 oz) (08/13 1427)  Intake/Output from previous day:   Intake/Output this shift: Total I/O In: 1812.5 [I.V.:1812.5] Out: 100 [Blood:100]  Physical exam the patient is alert and pleasant. She is moving all 4 extremities well. Her dressing is clean and dry.  Lab Results: No results found for this basename: WBC, HGB, HCT, PLT,  in the last 72 hours BMET No results found for this basename: NA, K, CL, CO2, GLUCOSE, BUN, CREATININE, CALCIUM,  in the last 72 hours  Studies/Results: Dg Cervical Spine 1 View  06/23/2014   CLINICAL DATA:  Chiari Malformation.  EXAM: DG CERVICAL SPINE - 1 VIEW  COMPARISON:  05/10/2014  FINDINGS: Lateral intraoperative view of the cervical spine demonstrates the patient to be intubated. Tissue spreaders are in place. A blunt tip probe is oriented from a posterior approach towards the posterior elements of C1  IMPRESSION: 1. C1 level localization, intraoperative.   Electronically Signed   By: Sherryl Barters M.D.   On: 06/23/2014 13:17    Assessment/Plan: The patient is doing well.  LOS: 0 days     Emmett Bracknell D 06/23/2014, 5:34 PM

## 2014-06-23 NOTE — Anesthesia Preprocedure Evaluation (Addendum)
Anesthesia Evaluation  Patient identified by MRN, date of birth, ID band Patient awake    Reviewed: Allergy & Precautions, H&P , NPO status , Patient's Chart, lab work & pertinent test results, reviewed documented beta blocker date and time   History of Anesthesia Complications (+) PONV  Airway Mallampati: II  Neck ROM: Full    Dental  (+) Dental Advisory Given   Pulmonary  breath sounds clear to auscultation        Cardiovascular hypertension, Pt. on medications and Pt. on home beta blockers Rhythm:Regular Rate:Normal     Neuro/Psych  Headaches, Anxiety Depression    GI/Hepatic   Endo/Other  Morbid obesity  Renal/GU      Musculoskeletal   Abdominal   Peds  Hematology   Anesthesia Other Findings   Reproductive/Obstetrics                         Anesthesia Physical Anesthesia Plan  ASA: III  Anesthesia Plan: General   Post-op Pain Management:    Induction: Intravenous  Airway Management Planned: Oral ETT  Additional Equipment:   Intra-op Plan:   Post-operative Plan: Extubation in OR  Informed Consent: I have reviewed the patients History and Physical, chart, labs and discussed the procedure including the risks, benefits and alternatives for the proposed anesthesia with the patient or authorized representative who has indicated his/her understanding and acceptance.   Dental advisory given  Plan Discussed with: Anesthesiologist and Surgeon  Anesthesia Plan Comments:        Anesthesia Quick Evaluation

## 2014-06-23 NOTE — Progress Notes (Signed)
Orthopedic Tech Progress Note Patient Details:  Morgan Kent [birth year] [id number] [employer] Type of Ortho Device: Soft collar Ortho Device/Splint Interventions: Application   Asia R Thompson 06/23/2014, 2:42 PM

## 2014-06-23 NOTE — Progress Notes (Signed)
Subjective:  The patient is somnolent but easily arousable. She looks well. She is in no apparent distress.  Objective: Vital signs in last 24 hours: Temp:  [98.2 F (36.8 C)-98.3 F (36.8 C)] 98.2 F (36.8 C) (08/13 1252) Pulse Rate:  [86-103] 100 (08/13 1300) Resp:  [13-26] 13 (08/13 1300) BP: (126-140)/(75-91) 137/80 mmHg (08/13 1300) SpO2:  [97 %-100 %] 97 % (08/13 1300)  Intake/Output from previous day:   Intake/Output this shift: Total I/O In: 1700 [I.V.:1700] Out: 100 [Blood:100]  Physical exam the patient is somnolent but arousable. She moves all 4 extremities well.  Lab Results: No results found for this basename: WBC, HGB, HCT, PLT,  in the last 72 hours BMET No results found for this basename: NA, K, CL, CO2, GLUCOSE, BUN, CREATININE, CALCIUM,  in the last 72 hours  Studies/Results: Dg Cervical Spine 1 View  06/23/2014   CLINICAL DATA:  Chiari Malformation.  EXAM: DG CERVICAL SPINE - 1 VIEW  COMPARISON:  05/10/2014  FINDINGS: Lateral intraoperative view of the cervical spine demonstrates the patient to be intubated. Tissue spreaders are in place. A blunt tip probe is oriented from a posterior approach towards the posterior elements of C1  IMPRESSION: 1. C1 level localization, intraoperative.   Electronically Signed   By: Sherryl Barters M.D.   On: 06/23/2014 13:17    Assessment/Plan: The patient is doing well. I spoke with her mother.  LOS: 0 days     Macario Shear D 06/23/2014, 1:21 PM

## 2014-06-23 NOTE — Op Note (Signed)
Brief history: The patient is a 33 year old black female who has had 2 previous chart decompression. She's had persistent symptoms. She was worked up with a cervical MRI which demonstrated recurrent/residual compression at the occipital cervical junction as well as syringomyelia. I discussed the various treatment options and recommended surgery. The patient has weighed the risks, benefits, and alternatives surgery and decided to proceed with the operation.  Preop diagnosis: Chiari 1 malformation, syringomyelia  Postop diagnosis: The same  Procedure: Suboccipital craniectomy and duraplasty for Chiari 1 malformation and syringomyelia using microdissection  Surgeon: Dr. Earle Gell  Assistant: Dr. Karie Chimera  Anesthesia: Gen. endotracheal  Estimated blood loss: 50 cc  Specimens: None  Drains: None  Complications: None  Description of procedure: The patient was brought to the operating room by the anesthesia team. General endotracheal anesthesia was induced. I applied the [place] 3 point headrest the patient's calvarium. The patient was then turned to the prone position on the chest rolls. Her suboccipital region was then shaved with clippers and this region as well as the posterior cervical and upper thoracic region were prepared with Betadine scrub and Betadine solution. Sterile drapes were applied. I then injected the area to be incised with Marcaine with epinephrine solution. I used a scalpel to ellipse out the patient's prior posterior cervical scar/keloid. I used electrocautery to perform a subfascial dissection exposing the occipital bone as well as the upper cervical spine. We brought the operative microscope into the field and under its magnification and illumination I completed the microdissection. I used electrocautery and the Metzenbaum scissors and electrocautery to expose the previous C1 and C2 laminectomy and duraplasty. I carefully selected at the occipital cervical junction  and noted that there was quite a bit of stenosis just cephalad to where the C1 lamina had been. It turns out this stenosis from a combination of buckled in duraplasty graft and scar tissue. I used a 15 blade scalpel to incise through the patient's prior duraplasty in a Y-shaped fashion. We tacked up the dural edges. I used the scissors and scalpel to ligate the area of restriction at the occipital cervical junction this greatly decompress the cerebellar tonsils. We could see that the cerebellar tonsils are free and there was good flow of cervical spinal fluid. I then obtained DuraGen and cut to the appropriate size and configuration. We performed a duraplasty using interrupted and running 4-0 Nurolon suture as well as 6-0 Prolene suture. We got a good closure. The patient was Valsalva I do not seen obvious leakage of spinal fluid. We obtained hemostasis using bipolar cautery.. The wound out with bacitracin solution. I placed a DuraSeal over the duraplasty. We then removed the cerebellar retractors and reapproximated patient's cervical thoracic fascia with interrupted #1 Vicryl suture. I reapproximated the subcutaneous tissue with interrupted 2-0 Vicryl suture. I then reapproximated the skin with a running 2-0 nylon suture. The wound is then coated with bacitracin ointment. A sterile dressing was applied. The drapes were removed. The patient was subsequently returned to the supine position. I then removed the [place] 3 point headrest. The patient was then extubated by the anesthesia team and transported to the post anesthesia care unit in stable condition. I report all sponge, instrument, and needle counts were correct at the end this case.

## 2014-06-23 NOTE — Anesthesia Postprocedure Evaluation (Signed)
  Anesthesia Post-op Note  Patient: Morgan Kent  Procedure(s) Performed: Procedure(s) with comments: SUBOCCIPITAL CRANIECTOMY CERVICAL LAMINECTOMY/DURAPLASTY (N/A) - suboccipital craniectomy with cervical laminectomy and duraplasty  Patient Location: PACU  Anesthesia Type:General  Level of Consciousness: awake and alert   Airway and Oxygen Therapy: Patient Spontanous Breathing  Post-op Pain: mild  Post-op Assessment: Post-op Vital signs reviewed  Post-op Vital Signs: stable  Last Vitals:  Filed Vitals:   06/23/14 1427  BP: 136/69  Pulse: 109  Temp:   Resp: 18    Complications: No apparent anesthesia complications

## 2014-06-24 ENCOUNTER — Encounter (HOSPITAL_COMMUNITY): Payer: Self-pay | Admitting: Neurosurgery

## 2014-06-24 NOTE — Evaluation (Signed)
Physical Therapy Evaluation Patient Details Name: Morgan Kent MRN: [id number] DOB: [birth year] Today's Date: 06/24/2014   History of Present Illness  Patient is a 33 yo female s/p Suboccipital craniectomy and duraplasty for Chiari 1 malformation and syringomyelia.  Clinical Impression  Patient demonstrates deficit in functional mobility as indicated below. Will benefit from skilled PT to address deficits and maximize safety for discharge. Will see as indicated and progress as tolerated. Educated patient on safety with mobility.     Follow Up Recommendations No PT follow up;Supervision - Intermittent    Equipment Recommendations  None recommended by PT    Recommendations for Other Services       Precautions / Restrictions Precautions Precaution Comments: cervical soft collar for comfort at this time Restrictions Weight Bearing Restrictions: No      Mobility  Bed Mobility               General bed mobility comments: received in chair  Transfers Overall transfer level: Modified independent Equipment used: None             General transfer comment: increased time to perform  Ambulation/Gait Ambulation/Gait assistance: Min guard Ambulation Distance (Feet): 160 Feet Assistive device: None Gait Pattern/deviations: Step-through pattern;Decreased stride length;Drifts right/left;Trunk flexed Gait velocity: decreased Gait velocity interpretation: Below normal speed for age/gender General Gait Details: VCs for upright posture and min guard for stability with ambulation  Stairs            Wheelchair Mobility    Modified Rankin (Stroke Patients Only)       Balance Overall balance assessment: No apparent balance deficits (not formally assessed) (patient feels unsteady with static standing)                                           Pertinent Vitals/Pain Pain Assessment: 0-10 Pain Score: 7  Pain Descriptors / Indicators: [place] Family/patient expects to be discharged to:: Private residence Living Arrangements: Children Available Help at Discharge: Family Type of Home: House Home Access: Ramped entrance     Home Layout: Two level;Able to live on main level with bedroom/bathroom Home Equipment: Kasandra Knudsen - single point      Prior Function Level of Independence: Independent with assistive device(s)               Hand Dominance   Dominant Hand: Right    Extremity/Trunk Assessment   Upper Extremity Assessment:  (history of carpel tunnel)           Lower Extremity Assessment: Overall WFL for tasks assessed         Communication   Communication:  (wears glasses)  Cognition Arousal/Alertness: Awake/alert Behavior During Therapy: WFL for tasks assessed/performed Overall Cognitive Status: Within Functional Limits for tasks assessed                      General Comments      Exercises        Assessment/Plan    PT Assessment Patient needs continued PT services  PT Diagnosis Difficulty walking;Abnormality of gait;Acute pain   PT Problem List Decreased activity tolerance;Decreased balance;Decreased mobility  PT Treatment Interventions DME instruction;Gait training;Stair training;Functional mobility training;Therapeutic activities;Therapeutic exercise;Balance training;Patient/family education   PT Goals (Current goals can be found in the Care Plan section) Acute Rehab PT Goals Patient Stated Goal: to go home  PT Goal Formulation: With patient Time For Goal Achievement: 07/08/14 Potential to Achieve Goals: Good    Frequency Min 3X/week   Barriers to discharge        Co-evaluation               End of Session Equipment Utilized During Treatment: Gait belt;Cervical collar (soft collar) Activity Tolerance: Patient tolerated treatment well Patient left: in chair;with family/visitor present (w/c for transfer) Nurse Communication: Mobility status          Time: 8887-5797 PT Time Calculation (min): 26 min   Charges:   PT Evaluation $Initial PT Evaluation Tier I: 1 Procedure PT Treatments $Gait Training: 8-22 mins $Therapeutic Activity: 8-22 mins   PT G CodesDuncan Dull 06/24/2014, 11:18 AM Alben Deeds, PT DPT  [phone]

## 2014-06-24 NOTE — Progress Notes (Signed)
Patient ID: Morgan Kent, female   DOB: 06-03-1981, 33 y.o.   MRN: [id number] Subjective:  The patient is alert and pleasant. She looks well.  Objective: Vital signs in last 24 hours: Temp:  [97.4 F (36.3 C)-98.3 F (36.8 C)] 98.1 F (36.7 C) (08/14 0437) Pulse Rate:  [86-118] 106 (08/14 0600) Resp:  [12-29] 20 (08/14 0600) BP: (110-141)/(47-91) 130/70 mmHg (08/14 0600) SpO2:  [93 %-100 %] 96 % (08/14 0600) Weight:  [116.3 kg (256 lb 6.3 oz)] 116.3 kg (256 lb 6.3 oz) (08/13 1427)  Intake/Output from previous day: 08/13 0701 - 08/14 0700 In: 4307.5 [P.O.:1320; I.V.:2937.5; IV Piggyback:50] Out: 9977 [Urine:3825; Blood:100] Intake/Output this shift:    Physical exam the patient is alert and oriented. She is moving all 4 extremities well. Her dressing is clean and dry.  Lab Results: No results found for this basename: WBC, HGB, HCT, PLT,  in the last 72 hours BMET No results found for this basename: NA, K, CL, CO2, GLUCOSE, BUN, CREATININE, CALCIUM,  in the last 72 hours  Studies/Results: Dg Cervical Spine 1 View  06/23/2014   CLINICAL DATA:  Chiari Malformation.  EXAM: DG CERVICAL SPINE - 1 VIEW  COMPARISON:  05/10/2014  FINDINGS: Lateral intraoperative view of the cervical spine demonstrates the patient to be intubated. Tissue spreaders are in place. A blunt tip probe is oriented from a posterior approach towards the posterior elements of C1  IMPRESSION: 1. C1 level localization, intraoperative.   Electronically Signed   By: Sherryl Barters M.Kent.   On: 06/23/2014 13:17    Assessment/Plan: Postop day 1: The patient is doing well. We will mobilize her. I will transfer to the floor. She will likely go home this weekend.  LOS: 1 day     Fantasha Daniele Kent 06/24/2014, 7:20 AM

## 2014-06-24 NOTE — Progress Notes (Signed)
Pt arrived to the 4N unit.  Pt settled in and oriented to room and unit.  VSS upon admission. Will continue to monitor. Cori Razor, RN 06/24/2014 9:30 AM

## 2014-06-25 NOTE — Progress Notes (Signed)
Per pt, during transfer to unit at 0930 yesterday morning, transferring RN accidentally wheeled her into a wall in the room. Pt stated that when the wheelchair hit the wall, her body jerked. This was witnessed by [place] as well per pt.  Pt denies any pain anywhere on body except for neck and head pain. Per pt, only the wheelchair made contact with the wall and no part of her body was hit. Pt has been complaining of headache through out day and rated her pain 9/10 around 1900. Pt states that she feels like she "needed more pain medicine today than yesterday". Pt received Percocet 2 tablets around 2000 and Morphine 2 mg IV at 2200. Pt still rating pain at 7/10, but states that the "intensity has decreased" and she is starting to "feel better". Will continue to monitor pain. Elspeth Cho, RN [phone] 06/25/14

## 2014-06-25 NOTE — Progress Notes (Signed)
Subjective: Patient reports doing OK  Objective: Vital signs in last 24 hours: Temp:  [98.1 F (36.7 C)-98.8 F (37.1 C)] 98.6 F (37 C) (08/15 0913) Pulse Rate:  [84-101] 91 (08/15 0913) Resp:  [16-20] 18 (08/15 0913) BP: (118-134)/(68-88) 130/76 mmHg (08/15 0913) SpO2:  [95 %-100 %] 100 % (08/15 0913)  Intake/Output from previous day: 08/14 0701 - 08/15 0700 In: 101.3 [I.V.:101.3] Out: 700 [Urine:700] Intake/Output this shift:    Physical Exam: Dressing CDI.  Up without difficulty.  Lab Results: No results found for this basename: WBC, HGB, HCT, PLT,  in the last 72 hours BMET No results found for this basename: NA, K, CL, CO2, GLUCOSE, BUN, CREATININE, CALCIUM,  in the last 72 hours  Studies/Results: No results found.  Assessment/Plan: Patient needs to transition from morphine to oxycodone prior to discharge.  Will attempt to do so today in the hopes that she can go home tomorrow.    LOS: 2 days    Peggyann Shoals, MD 06/25/2014, 12:38 PM

## 2014-06-25 NOTE — Progress Notes (Signed)
Physical Therapy Treatment Patient Details Name: Morgan Kent MRN: [id number] DOB: [birth year] Today's Date: 06/25/2014    History of Present Illness Patient is a 33 yo female s/p Suboccipital craniectomy and duraplasty for Chiari 1 malformation and syringomyelia.    PT Comments    Pt required max encouragement to participate in therapy but was able to increase mobility and at MOD I level. Pt c/o pain and stated multiple times " i hurt worse because of my wheelchair accident". Pt encouraged to continue mobility and ambulate unit as tolerated. Encouraged OOB for meals. Will sign off at this time; all acute PT goals met.   Follow Up Recommendations  No PT follow up;Supervision - Intermittent     Equipment Recommendations  None recommended by PT    Recommendations for Other Services       Precautions / Restrictions Precautions Precautions: Cervical Precaution Comments: pt given handout on cervical precautions  Required Braces or Orthoses: Cervical Brace Cervical Brace: Soft collar;Other (comment) (for comfort) Restrictions Weight Bearing Restrictions: No    Mobility  Bed Mobility Overal bed mobility: Modified Independent             General bed mobility comments: incr time to perform log rolling technique  Transfers Overall transfer level: Modified independent Equipment used: None             General transfer comment: no LOB or c/o dizziness  Ambulation/Gait Ambulation/Gait assistance: Modified independent (Device/Increase time) Ambulation Distance (Feet): 300 Feet Assistive device: None Gait Pattern/deviations: Step-through pattern;Wide base of support;Decreased stride length Gait velocity: decreased Gait velocity interpretation: Below normal speed for age/gender General Gait Details: no LOB noted with ambulation; pt encouraged to continue gt; required max encouragment to progress   Stairs            Wheelchair Mobility    Modified Rankin (Stroke  Patients Only)       Balance Overall balance assessment: No apparent balance deficits (not formally assessed);Modified Independent                                  Cognition Arousal/Alertness: Awake/alert Behavior During Therapy: WFL for tasks assessed/performed Overall Cognitive Status: Within Functional Limits for tasks assessed                      Exercises      General Comments General comments (skin integrity, edema, etc.): educated on cervical precautions and lifting restrictions      Pertinent Vitals/Pain Pain Assessment: 0-10 Pain Score: 7  Pain Location: headache Pain Descriptors / Indicators: Aching Pain Intervention(s): Limited activity within patient's tolerance;Premedicated before session    Home Living                      Prior Function            PT Goals (current goals can now be found in the care plan section) Acute Rehab PT Goals Patient Stated Goal: to not have pain Progress towards PT goals: Goals met/education completed, patient discharged from PT    Frequency  Min 3X/week    PT Plan Current plan remains appropriate    Co-evaluation             End of Session Equipment Utilized During Treatment: Cervical collar;Gait belt Activity Tolerance: Patient tolerated treatment well Patient left: in bed;with call bell/phone within reach     Time: [phone]  PT Time Calculation (min): 16 min  Charges:  $Gait Training: 8-22 mins                    G Codes:      Elie Confer Tzirel Leonor Mif[place]in, [place] 06/25/2014, 10:17 AM

## 2014-06-26 MED ORDER — OXYCODONE-ACETAMINOPHEN 5-325 MG PO TABS: 1.0000 | ORAL_TABLET | ORAL | Status: DC | PRN

## 2014-06-26 NOTE — Progress Notes (Signed)
Pt discharged by car with family.  Pt verbalized understanding of discharge instructions and medications.  Pt assessment and vitals stable.

## 2014-06-26 NOTE — Discharge Summary (Signed)
Physician Discharge Summary  Patient ID: Morgan Kent MRN: [id number] DOB/AGE: 08-08-1981 33 y.o.  Admit date: 06/23/2014 Discharge date: 06/26/2014  Admission Diagnoses: Chiari 1 malformation  Discharge Diagnoses: Chiari 1 malformation Active Problems:   Chiari malformation type I   Discharged Condition: good  Hospital Course: Patient was admitted to undergo surgical decompression of a Chiari malformation. She tolerated the surgery well. Her incision has been clean and dry. She is discharged home.  Consults: None  Significant Diagnostic Studies: None  Treatments: Suboccipital decompression of Chiari 1 malformation  Discharge Exam: Blood pressure 142/84, pulse 95, temperature 98.1 F (36.7 C), temperature source Oral, resp. rate 16, height 5\' 6"  (1.676 m), weight 116.3 kg (256 lb 6.3 oz), last menstrual period 06/23/2014, SpO2 97.00%. Incision is clean and dry suture line is intact. Motor function appears intact in upper and lower extremities. Station and gait are intact.  Disposition: 01-Home or Self Care  Discharge Instructions   Call MD for:  redness, tenderness, or signs of infection (pain, swelling, redness, odor or green/yellow discharge around incision site)    Complete by:  As directed      Call MD for:  severe uncontrolled pain    Complete by:  As directed      Call MD for:  temperature >100.4    Complete by:  As directed      Diet - low sodium heart healthy    Complete by:  As directed      Increase activity slowly    Complete by:  As directed             Medication List         adapalene 0.1 % gel  Commonly known as:  DIFFERIN  Apply 1 application topically at bedtime. Apply to forehead and chin     antiseptic oral rinse Liqd  15 mLs by Mouth Rinse route 2 (two) times daily.     ARTIFICIAL TEARS 1.4 % ophthalmic solution  Generic drug:  polyvinyl alcohol  Place 1 drop into both eyes 2 (two) times daily.     atenolol 25 MG tablet  Commonly known  as:  TENORMIN  Take 25 mg by mouth daily.     cetirizine 10 MG tablet  Commonly known as:  ZYRTEC  Take 10 mg by mouth daily.     clobetasol 0.05 % topical foam  Commonly known as:  OLUX  Apply 1 application topically at bedtime. Apply all over body     clobetasol 0.05 % external solution  Commonly known as:  TEMOVATE  Apply 1 application topically 2 (two) times daily. Apply to scalp     clobetasol cream 0.05 %  Commonly known as:  TEMOVATE  Apply 1 application topically 2 (two) times daily as needed (for lupus flare ups). Apply to face     desonide 0.05 % cream  Commonly known as:  DESOWEN  Apply 1 application topically every other day. Apply all over body     diclofenac sodium 1 % Gel  Commonly known as:  VOLTAREN  Apply 1 application topically 2 (two) times daily.     DULoxetine 60 MG capsule  Commonly known as:  CYMBALTA  Take 60 mg by mouth daily.     fluticasone 50 MCG/ACT nasal spray  Commonly known as:  FLONASE  Place 1 spray into both nostrils at bedtime.     gabapentin 300 MG capsule  Commonly known as:  NEURONTIN  Take 300 mg by mouth 3 (  three) times daily.     hydrochlorothiazide 25 MG tablet  Commonly known as:  HYDRODIURIL  Take 25 mg by mouth daily.     HYDROcodone-acetaminophen 10-325 MG per tablet  Commonly known as:  NORCO  Take 1 tablet by mouth every 6 (six) hours as needed.     hydroxychloroquine 200 MG tablet  Commonly known as:  PLAQUENIL  Take 200 mg by mouth 2 (two) times daily.     meloxicam 7.5 MG tablet  Commonly known as:  MOBIC  Take 7.5 mg by mouth 2 (two) times daily as needed (joint pain).     montelukast 10 MG tablet  Commonly known as:  SINGULAIR  Take 10 mg by mouth at bedtime.     multivitamin with minerals Tabs tablet  Take 1 tablet by mouth daily.     nortriptyline 10 MG capsule  Commonly known as:  PAMELOR  Take 40 mg by mouth at bedtime.     oxyCODONE-acetaminophen 5-325 MG per tablet  Commonly known as:   PERCOCET/ROXICET  Take 1-2 tablets by mouth every 4 (four) hours as needed for moderate pain.     oxyCODONE-acetaminophen 10-325 MG per tablet  Commonly known as:  PERCOCET  Take 1 tablet by mouth every 8 (eight) hours as needed for pain.     SHAROBEL 0.35 MG tablet  Generic drug:  norethindrone  Take 1 tablet by mouth daily.     tacrolimus 0.1 % ointment  Commonly known as:  PROTOPIC  Apply 1 application topically See admin instructions. Apply every night at bedtime for 14 days and then stop for 14 days and then repeat (for lupus flare ups)     traZODone 50 MG tablet  Commonly known as:  DESYREL  Take 12.5 mg by mouth at bedtime. 1/4 tablet         Signed: Earleen Newport 06/26/2014, 11:36 AM

## 2014-06-30 ENCOUNTER — Ambulatory Visit: Payer: Medicaid Other | Admitting: Neurology

## 2014-07-07 ENCOUNTER — Encounter (HOSPITAL_COMMUNITY): Payer: Self-pay | Admitting: *Deleted

## 2014-07-07 ENCOUNTER — Inpatient Hospital Stay (HOSPITAL_COMMUNITY)
Admission: AD | Admit: 2014-07-07 | Discharge: 2014-07-12 | DRG: 908 | Disposition: A | Discharge status: Home / Self Care | Payer: Medicaid Other | Source: Ambulatory Visit | Attending: Neurosurgery | Admitting: Neurosurgery

## 2014-07-07 ENCOUNTER — Encounter (HOSPITAL_COMMUNITY)
Admission: AD | Disposition: A | Discharge status: Home / Self Care | Payer: Self-pay | Source: Ambulatory Visit | Attending: Neurosurgery

## 2014-07-07 ENCOUNTER — Encounter (HOSPITAL_COMMUNITY): Payer: Medicaid Other | Admitting: Anesthesiology

## 2014-07-07 ENCOUNTER — Inpatient Hospital Stay (HOSPITAL_COMMUNITY): Payer: Medicaid Other

## 2014-07-07 ENCOUNTER — Inpatient Hospital Stay (HOSPITAL_COMMUNITY): Payer: Medicaid Other | Admitting: Anesthesiology

## 2014-07-07 DIAGNOSIS — T8183XA Persistent postprocedural fistula, initial encounter: Secondary | ICD-10-CM | POA: Diagnosis present

## 2014-07-07 DIAGNOSIS — M329 Systemic lupus erythematosus, unspecified: Secondary | ICD-10-CM | POA: Diagnosis present

## 2014-07-07 DIAGNOSIS — G9601 Cranial cerebrospinal fluid leak, spontaneous: Secondary | ICD-10-CM | POA: Diagnosis present

## 2014-07-07 DIAGNOSIS — Y838 Other surgical procedures as the cause of abnormal reaction of the patient, or of later complication, without mention of misadventure at the time of the procedure: Secondary | ICD-10-CM | POA: Diagnosis present

## 2014-07-07 DIAGNOSIS — R51 Headache: Secondary | ICD-10-CM | POA: Diagnosis present

## 2014-07-07 DIAGNOSIS — G9782 Other postprocedural complications and disorders of nervous system: Secondary | ICD-10-CM | POA: Diagnosis present

## 2014-07-07 DIAGNOSIS — G960 Cerebrospinal fluid leak: Secondary | ICD-10-CM

## 2014-07-07 HISTORY — PX: PLACEMENT OF LUMBAR DRAIN: SHX6028

## 2014-07-07 SURGERY — PLACEMENT OF LUMBAR DRAIN
Anesthesia: Monitor Anesthesia Care | Site: Back

## 2014-07-07 MED ORDER — ACETAMINOPHEN 650 MG RE SUPP: 650.0000 mg | RECTAL | Status: DC | PRN

## 2014-07-07 MED ORDER — LORATADINE 10 MG PO TABS
10.0000 mg | ORAL_TABLET | Freq: Every day | ORAL | Status: DC
  Administered 2014-07-08 – 2014-07-12 (×5): 10 mg via ORAL
  Filled 2014-07-07 (×5): qty 1

## 2014-07-07 MED ORDER — OXYCODONE-ACETAMINOPHEN 10-325 MG PO TABS: 1.0000 | ORAL_TABLET | ORAL | Status: DC | PRN

## 2014-07-07 MED ORDER — MIDAZOLAM HCL 5 MG/5ML IJ SOLN
INTRAMUSCULAR | Status: DC | PRN
  Administered 2014-07-07: 1 mg via INTRAVENOUS
  Administered 2014-07-07: 2 mg via INTRAVENOUS
  Administered 2014-07-07: 1 mg via INTRAVENOUS

## 2014-07-07 MED ORDER — LIDOCAINE HCL (CARDIAC) 20 MG/ML IV SOLN
INTRAVENOUS | Status: AC
  Filled 2014-07-07: qty 5

## 2014-07-07 MED ORDER — HYDROMORPHONE HCL PF 1 MG/ML IJ SOLN
INTRAMUSCULAR | Status: AC
  Filled 2014-07-07: qty 1

## 2014-07-07 MED ORDER — MENTHOL 3 MG MT LOZG: 1.0000 | LOZENGE | OROMUCOSAL | Status: DC | PRN

## 2014-07-07 MED ORDER — FENTANYL CITRATE 0.05 MG/ML IJ SOLN
INTRAMUSCULAR | Status: AC
Start: 2014-07-07 — End: 2014-07-07
  Filled 2014-07-07: qty 5

## 2014-07-07 MED ORDER — SERTRALINE HCL 50 MG PO TABS
50.0000 mg | ORAL_TABLET | Freq: Every day | ORAL | Status: DC
  Filled 2014-07-07 (×5): qty 1

## 2014-07-07 MED ORDER — OXYCODONE HCL 5 MG PO TABS
5.0000 mg | ORAL_TABLET | Freq: Once | ORAL | Status: AC | PRN
  Administered 2014-07-07: 5 mg via ORAL

## 2014-07-07 MED ORDER — DIAZEPAM 5 MG PO TABS
5.0000 mg | ORAL_TABLET | Freq: Four times a day (QID) | ORAL | Status: DC | PRN
  Administered 2014-07-07 – 2014-07-12 (×13): 5 mg via ORAL
  Filled 2014-07-07 (×13): qty 1

## 2014-07-07 MED ORDER — NORETHINDRONE 0.35 MG PO TABS
1.0000 | ORAL_TABLET | Freq: Every day | ORAL | Status: DC
  Filled 2014-07-07: qty 1

## 2014-07-07 MED ORDER — MELOXICAM 7.5 MG PO TABS
7.5000 mg | ORAL_TABLET | Freq: Two times a day (BID) | ORAL | Status: DC | PRN
  Filled 2014-07-07: qty 1

## 2014-07-07 MED ORDER — CEFAZOLIN SODIUM-DEXTROSE 2-3 GM-% IV SOLR
2.0000 g | Freq: Three times a day (TID) | INTRAVENOUS | Status: AC
  Administered 2014-07-07 – 2014-07-08 (×2): 2 g via INTRAVENOUS
  Filled 2014-07-07 (×2): qty 50

## 2014-07-07 MED ORDER — GABAPENTIN 300 MG PO CAPS
300.0000 mg | ORAL_CAPSULE | Freq: Three times a day (TID) | ORAL | Status: DC
  Administered 2014-07-07 – 2014-07-12 (×14): 300 mg via ORAL
  Filled 2014-07-07 (×16): qty 1

## 2014-07-07 MED ORDER — ADAPALENE 0.1 % EX GEL: 1.0000 "application " | Freq: Every day | CUTANEOUS | Status: DC

## 2014-07-07 MED ORDER — LACTATED RINGERS IV SOLN
INTRAVENOUS | Status: DC | PRN
  Administered 2014-07-07: 19:00:00 via INTRAVENOUS

## 2014-07-07 MED ORDER — ZOLPIDEM TARTRATE 5 MG PO TABS
5.0000 mg | ORAL_TABLET | Freq: Every evening | ORAL | Status: DC | PRN
  Administered 2014-07-10: 5 mg via ORAL
  Filled 2014-07-07: qty 1

## 2014-07-07 MED ORDER — MIDAZOLAM HCL 2 MG/2ML IJ SOLN
INTRAMUSCULAR | Status: AC
  Filled 2014-07-07: qty 2

## 2014-07-07 MED ORDER — DOCUSATE SODIUM 100 MG PO CAPS
100.0000 mg | ORAL_CAPSULE | Freq: Two times a day (BID) | ORAL | Status: DC
  Administered 2014-07-07 – 2014-07-12 (×10): 100 mg via ORAL
  Filled 2014-07-07 (×11): qty 1

## 2014-07-07 MED ORDER — HYDROCODONE-ACETAMINOPHEN 5-325 MG PO TABS: 1.0000 | ORAL_TABLET | ORAL | Status: DC | PRN

## 2014-07-07 MED ORDER — HYDROMORPHONE HCL PF 1 MG/ML IJ SOLN
0.2500 mg | INTRAMUSCULAR | Status: DC | PRN
  Administered 2014-07-07 (×2): 0.5 mg via INTRAVENOUS

## 2014-07-07 MED ORDER — OXYCODONE HCL 5 MG PO TABS
ORAL_TABLET | ORAL | Status: AC
  Filled 2014-07-07: qty 1

## 2014-07-07 MED ORDER — ALUM & MAG HYDROXIDE-SIMETH 200-200-20 MG/5ML PO SUSP: 30.0000 mL | Freq: Four times a day (QID) | ORAL | Status: DC | PRN

## 2014-07-07 MED ORDER — CEFAZOLIN SODIUM-DEXTROSE 2-3 GM-% IV SOLR
INTRAVENOUS | Status: DC | PRN
  Administered 2014-07-07: 2 g via INTRAVENOUS

## 2014-07-07 MED ORDER — BUPIVACAINE-EPINEPHRINE 0.5% -1:200000 IJ SOLN
INTRAMUSCULAR | Status: DC | PRN
  Administered 2014-07-07: 15 mL
  Administered 2014-07-07: 3 mL

## 2014-07-07 MED ORDER — SODIUM CHLORIDE 0.9 % IR SOLN
Status: DC | PRN
  Administered 2014-07-07: 20:00:00

## 2014-07-07 MED ORDER — FLUTICASONE PROPIONATE 50 MCG/ACT NA SUSP
1.0000 | Freq: Every day | NASAL | Status: DC
  Administered 2014-07-08 – 2014-07-11 (×4): 1 via NASAL
  Filled 2014-07-07: qty 16

## 2014-07-07 MED ORDER — FENTANYL CITRATE 0.05 MG/ML IJ SOLN
INTRAMUSCULAR | Status: DC | PRN
  Administered 2014-07-07 (×2): 25 ug via INTRAVENOUS
  Administered 2014-07-07 (×2): 50 ug via INTRAVENOUS

## 2014-07-07 MED ORDER — DULOXETINE HCL 60 MG PO CPEP
60.0000 mg | ORAL_CAPSULE | Freq: Every day | ORAL | Status: DC
  Administered 2014-07-08 – 2014-07-12 (×5): 60 mg via ORAL
  Filled 2014-07-07 (×5): qty 1

## 2014-07-07 MED ORDER — NORTRIPTYLINE HCL 10 MG PO CAPS
40.0000 mg | ORAL_CAPSULE | Freq: Every day | ORAL | Status: DC
  Administered 2014-07-08 – 2014-07-11 (×4): 40 mg via ORAL
  Filled 2014-07-07 (×5): qty 4

## 2014-07-07 MED ORDER — PROMETHAZINE HCL 25 MG/ML IJ SOLN: 6.2500 mg | INTRAMUSCULAR | Status: DC | PRN

## 2014-07-07 MED ORDER — PHENOL 1.4 % MT LIQD: 1.0000 | OROMUCOSAL | Status: DC | PRN

## 2014-07-07 MED ORDER — HYDROCHLOROTHIAZIDE 25 MG PO TABS
25.0000 mg | ORAL_TABLET | Freq: Every day | ORAL | Status: DC
  Administered 2014-07-08 – 2014-07-12 (×5): 25 mg via ORAL
  Filled 2014-07-07 (×6): qty 1

## 2014-07-07 MED ORDER — POLYVINYL ALCOHOL 1.4 % OP SOLN
1.0000 [drp] | Freq: Two times a day (BID) | OPHTHALMIC | Status: DC
  Administered 2014-07-08 – 2014-07-11 (×8): 1 [drp] via OPHTHALMIC
  Filled 2014-07-07: qty 15

## 2014-07-07 MED ORDER — PROPOFOL 10 MG/ML IV BOLUS
INTRAVENOUS | Status: AC
  Filled 2014-07-07: qty 20

## 2014-07-07 MED ORDER — CEFAZOLIN SODIUM-DEXTROSE 2-3 GM-% IV SOLR
INTRAVENOUS | Status: AC
  Filled 2014-07-07: qty 50

## 2014-07-07 MED ORDER — ACETAMINOPHEN 325 MG PO TABS: 650.0000 mg | ORAL_TABLET | ORAL | Status: DC | PRN

## 2014-07-07 MED ORDER — PANTOPRAZOLE SODIUM 40 MG IV SOLR
40.0000 mg | Freq: Every day | INTRAVENOUS | Status: DC
  Administered 2014-07-07 – 2014-07-08 (×2): 40 mg via INTRAVENOUS
  Filled 2014-07-07 (×3): qty 40

## 2014-07-07 MED ORDER — OXYCODONE-ACETAMINOPHEN 5-325 MG PO TABS
1.0000 | ORAL_TABLET | ORAL | Status: DC | PRN
  Administered 2014-07-07 – 2014-07-12 (×19): 2 via ORAL
  Filled 2014-07-07 (×19): qty 2

## 2014-07-07 MED ORDER — MONTELUKAST SODIUM 10 MG PO TABS
10.0000 mg | ORAL_TABLET | Freq: Every day | ORAL | Status: DC
  Administered 2014-07-08 – 2014-07-11 (×4): 10 mg via ORAL
  Filled 2014-07-07 (×5): qty 1

## 2014-07-07 MED ORDER — 0.9 % SODIUM CHLORIDE (POUR BTL) OPTIME
TOPICAL | Status: DC | PRN
  Administered 2014-07-07: 1000 mL

## 2014-07-07 MED ORDER — MORPHINE SULFATE 2 MG/ML IJ SOLN
1.0000 mg | INTRAMUSCULAR | Status: DC | PRN
  Administered 2014-07-07 – 2014-07-08 (×3): 4 mg via INTRAVENOUS
  Administered 2014-07-08: 2 mg via INTRAVENOUS
  Administered 2014-07-08: 4 mg via INTRAVENOUS
  Administered 2014-07-08 – 2014-07-09 (×3): 2 mg via INTRAVENOUS
  Administered 2014-07-09: 4 mg via INTRAVENOUS
  Administered 2014-07-09: 2 mg via INTRAVENOUS
  Administered 2014-07-09 – 2014-07-11 (×10): 4 mg via INTRAVENOUS
  Filled 2014-07-07: qty 2
  Filled 2014-07-07: qty 1
  Filled 2014-07-07 (×3): qty 2
  Filled 2014-07-07: qty 1
  Filled 2014-07-07 (×6): qty 2
  Filled 2014-07-07: qty 1
  Filled 2014-07-07 (×3): qty 2
  Filled 2014-07-07 (×2): qty 1
  Filled 2014-07-07 (×2): qty 2

## 2014-07-07 MED ORDER — TRAZODONE HCL 50 MG PO TABS: 12.5000 mg | ORAL_TABLET | Freq: Every day | ORAL | Status: DC

## 2014-07-07 MED ORDER — LACTATED RINGERS IV SOLN
INTRAVENOUS | Status: DC
  Administered 2014-07-07: 23:00:00 via INTRAVENOUS

## 2014-07-07 MED ORDER — OXYCODONE HCL 5 MG/5ML PO SOLN: 5.0000 mg | Freq: Once | ORAL | Status: AC | PRN

## 2014-07-07 MED ORDER — HYDROCODONE-ACETAMINOPHEN 10-325 MG PO TABS: 1.0000 | ORAL_TABLET | ORAL | Status: DC | PRN

## 2014-07-07 MED ORDER — ONDANSETRON HCL 4 MG/2ML IJ SOLN
4.0000 mg | INTRAMUSCULAR | Status: DC | PRN
  Administered 2014-07-09: 4 mg via INTRAVENOUS
  Filled 2014-07-07: qty 2

## 2014-07-07 MED ORDER — PROPOFOL 10 MG/ML IV BOLUS
INTRAVENOUS | Status: DC | PRN
  Administered 2014-07-07: 30 mg via INTRAVENOUS
  Administered 2014-07-07 (×2): 50 mg via INTRAVENOUS
  Administered 2014-07-07 (×3): 30 mg via INTRAVENOUS

## 2014-07-07 MED ORDER — TACROLIMUS 0.1 % EX OINT: 1.0000 "application " | TOPICAL_OINTMENT | CUTANEOUS | Status: DC

## 2014-07-07 MED ORDER — HYDROXYCHLOROQUINE SULFATE 200 MG PO TABS
200.0000 mg | ORAL_TABLET | Freq: Two times a day (BID) | ORAL | Status: DC
  Administered 2014-07-07 – 2014-07-12 (×10): 200 mg via ORAL
  Filled 2014-07-07 (×11): qty 1

## 2014-07-07 MED ORDER — ADULT MULTIVITAMIN W/MINERALS CH
1.0000 | ORAL_TABLET | Freq: Every day | ORAL | Status: DC
  Administered 2014-07-09 – 2014-07-12 (×4): 1 via ORAL
  Filled 2014-07-07 (×5): qty 1

## 2014-07-07 MED ORDER — ATENOLOL 25 MG PO TABS
25.0000 mg | ORAL_TABLET | Freq: Every day | ORAL | Status: DC
  Administered 2014-07-08 – 2014-07-12 (×5): 25 mg via ORAL
  Filled 2014-07-07 (×5): qty 1

## 2014-07-07 SURGICAL SUPPLY — 65 items
BAG DECANTER FOR FLEXI CONT (MISCELLANEOUS) ×3 IMPLANT
BLADE SURG 10 STRL SS (BLADE) IMPLANT
BLADE SURG 15 STRL LF DISP TIS (BLADE) ×1 IMPLANT
BLADE SURG 15 STRL SS (BLADE) ×2
BLADE SURG ROTATE 9660 (MISCELLANEOUS) IMPLANT
BOOT SUTURE AID YELLOW STND (SUTURE) IMPLANT
CANISTER SUCT 3000ML (MISCELLANEOUS) ×3 IMPLANT
CATH VENTRIC 35X38 W/TROCAR LG (CATHETERS) IMPLANT
CONT SPEC 4OZ CLIKSEAL STRL BL (MISCELLANEOUS) IMPLANT
CORDS BIPOLAR (ELECTRODE) IMPLANT
COVER MAYO STAND STRL (DRAPES) IMPLANT
COVER TABLE BACK 60X90 (DRAPES) ×3 IMPLANT
DRAPE INCISE IOBAN 66X45 STRL (DRAPES) IMPLANT
DRAPE INCISE IOBAN 85X60 (DRAPES) ×6 IMPLANT
DRAPE ORTHO SPLIT 77X108 STRL (DRAPES) ×2
DRAPE POUCH INSTRU U-SHP 10X18 (DRAPES) IMPLANT
DRAPE SURG 17X23 STRL (DRAPES) IMPLANT
DRAPE SURG ORHT 6 SPLT 77X108 (DRAPES) ×1 IMPLANT
ELECT CAUTERY BLADE 6.4 (BLADE) IMPLANT
ELECT REM PT RETURN 9FT ADLT (ELECTROSURGICAL)
ELECTRODE REM PT RTRN 9FT ADLT (ELECTROSURGICAL) IMPLANT
GAUZE SPONGE 4X4 12PLY STRL (GAUZE/BANDAGES/DRESSINGS) ×6 IMPLANT
GAUZE SPONGE 4X4 16PLY XRAY LF (GAUZE/BANDAGES/DRESSINGS) ×3 IMPLANT
GLOVE BIO SURGEON STRL SZ 6.5 (GLOVE) ×4 IMPLANT
GLOVE BIO SURGEON STRL SZ8.5 (GLOVE) ×3 IMPLANT
GLOVE BIO SURGEONS STRL SZ 6.5 (GLOVE) ×2
GLOVE BIOGEL PI IND STRL 7.0 (GLOVE) ×2 IMPLANT
GLOVE BIOGEL PI INDICATOR 7.0 (GLOVE) ×4
GLOVE EXAM NITRILE LRG STRL (GLOVE) IMPLANT
GLOVE EXAM NITRILE MD LF STRL (GLOVE) IMPLANT
GLOVE EXAM NITRILE XL STR (GLOVE) IMPLANT
GLOVE EXAM NITRILE XS STR PU (GLOVE) IMPLANT
GLOVE SS BIOGEL STRL SZ 8 (GLOVE) ×1 IMPLANT
GLOVE SUPERSENSE BIOGEL SZ 8 (GLOVE) ×2
GOWN STRL REUS W/ TWL LRG LVL3 (GOWN DISPOSABLE) ×2 IMPLANT
GOWN STRL REUS W/ TWL XL LVL3 (GOWN DISPOSABLE) ×1 IMPLANT
GOWN STRL REUS W/TWL LRG LVL3 (GOWN DISPOSABLE) ×4
GOWN STRL REUS W/TWL XL LVL3 (GOWN DISPOSABLE) ×2
KIT BASIN OR (CUSTOM PROCEDURE TRAY) ×3 IMPLANT
KIT DRAIN CSF ACCUDRAIN (MISCELLANEOUS) ×3 IMPLANT
KIT ROOM TURNOVER OR (KITS) ×3 IMPLANT
NEEDLE HYPO 22GX1.5 SAFETY (NEEDLE) ×3 IMPLANT
NS IRRIG 1000ML POUR BTL (IV SOLUTION) ×3 IMPLANT
PACK EENT II TURBAN DRAPE (CUSTOM PROCEDURE TRAY) ×3 IMPLANT
PAD ARMBOARD 7.5X6 YLW CONV (MISCELLANEOUS) ×9 IMPLANT
PATTIES SURGICAL .5 X.5 (GAUZE/BANDAGES/DRESSINGS) IMPLANT
PATTIES SURGICAL 1X1 (DISPOSABLE) IMPLANT
PENCIL BUTTON HOLSTER BLD 10FT (ELECTRODE) IMPLANT
STAPLER SKIN PROX WIDE 3.9 (STAPLE) ×3 IMPLANT
SUT BONE WAX W31G (SUTURE) IMPLANT
SUT ETHILON 2 0 FS 18 (SUTURE) IMPLANT
SUT ETHILON 3 0 FSL (SUTURE) ×3 IMPLANT
SUT VIC AB 0 OS6 3-18 (SUTURE) ×3 IMPLANT
SUT VIC AB 2-0 CP2 18 (SUTURE) IMPLANT
SUT VIC AB 3-0 SH 8-18 (SUTURE) IMPLANT
SYR BULB 3OZ (MISCELLANEOUS) IMPLANT
SYR CONTROL 10ML LL (SYRINGE) ×3 IMPLANT
TAPE CLOTH SURG 4X10 WHT LF (GAUZE/BANDAGES/DRESSINGS) ×6 IMPLANT
TOWEL OR 17X24 6PK STRL BLUE (TOWEL DISPOSABLE) ×3 IMPLANT
TOWEL OR 17X26 10 PK STRL BLUE (TOWEL DISPOSABLE) ×3 IMPLANT
TRAY FOLEY CATH 14FRSI W/METER (CATHETERS) IMPLANT
TUBE CONNECTING 12'X1/4 (SUCTIONS)
TUBE CONNECTING 12X1/4 (SUCTIONS) IMPLANT
UNDERPAD 30X30 INCONTINENT (UNDERPADS AND DIAPERS) IMPLANT
WATER STERILE IRR 1000ML POUR (IV SOLUTION) IMPLANT

## 2014-07-07 NOTE — Anesthesia Postprocedure Evaluation (Signed)
Anesthesia Post Note  Patient: Morgan Kent  Procedure(s) Performed: Procedure(s) (LRB): PLACEMENT OF LUMBAR DRAIN AND CLOSURE OF CERVICAL INCISION (N/A)  Anesthesia type: MAC  Patient location: PACU  Post pain: Pain level controlled  Post assessment: Patient's Cardiovascular Status Stable  Last Vitals:  Filed Vitals:   07/07/14 2015  BP:   Pulse: 102  Temp:   Resp: 21    Post vital signs: Reviewed and stable  Level of consciousness: sedated  Complications: No apparent anesthesia complications

## 2014-07-07 NOTE — Anesthesia Preprocedure Evaluation (Signed)
Anesthesia Evaluation    Reviewed: Allergy & Precautions, H&P , NPO status , Patient's Chart, lab work & pertinent test results, reviewed documented beta blocker date and time   History of Anesthesia Complications (+) PONV and history of anesthetic complications  Airway Mallampati: II  Neck ROM: Full    Dental   Pulmonary  breath sounds clear to auscultation        Cardiovascular hypertension, Pt. on medications and Pt. on home beta blockers     Neuro/Psych  Headaches, PSYCHIATRIC DISORDERS Anxiety Depression    GI/Hepatic negative GI ROS, Neg liver ROS,   Endo/Other  Morbid obesity  Renal/GU negative Renal ROS     Musculoskeletal   Abdominal   Peds  Hematology   Anesthesia Other Findings   Reproductive/Obstetrics                           Anesthesia Physical  Anesthesia Plan  ASA: III  Anesthesia Plan: MAC   Post-op Pain Management:    Induction:   Airway Management Planned: Oral ETT and Simple Face Mask  Additional Equipment:   Intra-op Plan:   Post-operative Plan:   Informed Consent:   Plan Discussed with:   Anesthesia Plan Comments:         Anesthesia Quick Evaluation

## 2014-07-07 NOTE — H&P (Signed)
Subjective: The patient is a 33 year old black female on whom I previously performed a redo suboccipital craniectomy and duraplasty approximately 2 weeks ago. I saw her in the office 2 days ago and remove her sutures. The patient developed some watery drainage from her wound last night. She called today and had her come into the office.  Past Medical History  Diagnosis Date  . PONV (postoperative nausea and vomiting)   . Hypertension   . Depression   . Anxiety   . History of kidney stones   . Headache(784.0)   . Dizziness   . Arthritis     rheumo possible  . Lupus   . Chiari malformation     Past Surgical History  Procedure Laterality Date  . Craniotomy  14,06    chiari  . Carpel tunnel  Left 2014  . Dental surgery    . Hernia repair      umbilical  . Suboccipital craniectomy cervical laminectomy N/A 09/29/2013    Procedure: SUBOCCIPITAL CRANIECTOMY CERVICAL LAMINECTOMY/DURAPLASTY;  Surgeon: Ophelia Charter, MD;  Location: [place] [facility];  Service: Neurosurgery;  Laterality: N/A;  posterior  . Suboccipital craniectomy cervical laminectomy N/A 06/23/2014    Procedure: SUBOCCIPITAL CRANIECTOMY CERVICAL LAMINECTOMY/DURAPLASTY;  Surgeon: Newman Pies, MD;  Location: [place] [facility];  Service: Neurosurgery;  Laterality: N/A;  suboccipital craniectomy with cervical laminectomy and duraplasty    Allergies  Allergen Reactions  . Cephalexin Itching    Per [facility] records    History  Substance Use Topics  . Smoking status: Never Smoker   . Smokeless tobacco: Never Used  . Alcohol Use: No    History reviewed. No pertinent family history. Prior to Admission medications   Medication Sig Start Date End Date Taking? Authorizing Provider  atenolol (TENORMIN) 25 MG tablet Take 25 mg by mouth daily.   Yes Historical Provider, MD  cetirizine (ZYRTEC) 10 MG tablet Take 10 mg by mouth daily.   Yes Historical Provider, MD  DULoxetine (CYMBALTA) 60 MG capsule Take 60 mg by mouth daily.    Yes Historical Provider, MD  fluticasone (FLONASE) 50 MCG/ACT nasal spray Place 1 spray into both nostrils at bedtime.    Yes Historical Provider, MD  gabapentin (NEURONTIN) 300 MG capsule Take 300 mg by mouth 3 (three) times daily.   Yes Historical Provider, MD  hydrochlorothiazide (HYDRODIURIL) 25 MG tablet Take 25 mg by mouth daily.   Yes Historical Provider, MD  hydroxychloroquine (PLAQUENIL) 200 MG tablet Take 200 mg by mouth 2 (two) times daily.   Yes Historical Provider, MD  meloxicam (MOBIC) 7.5 MG tablet Take 7.5 mg by mouth 2 (two) times daily as needed (joint pain).   Yes Historical Provider, MD  montelukast (SINGULAIR) 10 MG tablet Take 10 mg by mouth at bedtime.   Yes Historical Provider, MD  Multiple Vitamin (MULTIVITAMIN WITH MINERALS) TABS tablet Take 1 tablet by mouth daily.   Yes Historical Provider, MD  norethindrone (SHAROBEL) 0.35 MG tablet Take 1 tablet by mouth daily.   Yes Historical Provider, MD  nortriptyline (PAMELOR) 10 MG capsule Take 40 mg by mouth at bedtime.   Yes Historical Provider, MD  oxyCODONE-acetaminophen (PERCOCET/ROXICET) 5-325 MG per tablet Take 1-2 tablets by mouth every 4 (four) hours as needed for moderate pain. 06/26/14  Yes Kristeen Miss, MD  traZODone (DESYREL) 50 MG tablet Take 12.5 mg by mouth at bedtime. 1/4 tablet   Yes Historical Provider, MD  adapalene (DIFFERIN) 0.1 % gel Apply 1 application topically at bedtime.  Apply to forehead and chin    Historical Provider, MD  antiseptic oral rinse (BIOTENE) LIQD 15 mLs by Mouth Rinse route 2 (two) times daily.     Historical Provider, MD  clobetasol (OLUX) 0.05 % topical foam Apply 1 application topically at bedtime. Apply all over body    Historical Provider, MD  clobetasol (TEMOVATE) 0.05 % external solution Apply 1 application topically 2 (two) times daily. Apply to scalp    Historical Provider, MD  clobetasol cream (TEMOVATE) 2.68 % Apply 1 application topically 2 (two) times daily as needed (for lupus  flare ups). Apply to face    Historical Provider, MD  desonide (DESOWEN) 0.05 % cream Apply 1 application topically every other day. Apply all over body    Historical Provider, MD  diclofenac sodium (VOLTAREN) 1 % GEL Apply 1 application topically 2 (two) times daily.    Historical Provider, MD  HYDROcodone-acetaminophen (NORCO) 10-325 MG per tablet Take 1 tablet by mouth every 6 (six) hours as needed.    Historical Provider, MD  oxyCODONE-acetaminophen (PERCOCET) 10-325 MG per tablet Take 1 tablet by mouth every 8 (eight) hours as needed for pain.    Historical Provider, MD  Polyethyl Glycol-Propyl Glycol (SYSTANE ULTRA) 0.4-0.3 % SOLN     Historical Provider, MD  polyvinyl alcohol (ARTIFICIAL TEARS) 1.4 % ophthalmic solution Place 1 drop into both eyes 2 (two) times daily.    Historical Provider, MD  sertraline (ZOLOFT) 50 MG tablet Take by mouth.    Historical Provider, MD  tacrolimus (PROTOPIC) 0.1 % ointment Apply 1 application topically See admin instructions. Apply every night at bedtime for 14 days and then stop for 14 days and then repeat (for lupus flare ups)    Historical Provider, MD     Review of Systems  Positive ROS: As above  All other systems have been reviewed and were otherwise negative with the exception of those mentioned in the HPI and as above.  Objective: Vital signs in last 24 hours: Temp:  [99 F (37.2 C)] 99 F (37.2 C) (08/27 1825) Pulse Rate:  [94] 94 (08/27 1825) Resp:  [18] 18 (08/27 1825) BP: (160)/(96) 160/96 mmHg (08/27 1825) SpO2:  [100 %] 100 % (08/27 1825) Weight:  [116.121 kg (256 lb)] 116.121 kg (256 lb) (08/27 1825)  General Appearance: Alert, cooperative, no distress, Head: Normocephalic, without obvious abnormality, atraumatic Eyes: PERRL, conjunctiva/corneas clear, EOM's intact,    Ears: Normal  Throat: Normal  Neck: The patient's posterior cervical incision appears to be healing well. There is an area of watery discharge at the cephalad  aspect of the wound. She has some keloid.  Back: Symmetric, no curvature, ROM normal, no CVA tenderness Lungs: Clear to auscultation bilaterally, respirations unlabored Heart: Regular rate and rhythm, no murmur, rub or gallop Abdomen: Soft, non-tender,, no masses, no organomegaly  NEUROLOGIC:   Mental status: alert and oriented, no aphasia, good attention span, Fund of knowledge/ memory ok Motor Exam - grossly normal Sensory Exam - grossly normal Reflexes:  Coordination - grossly normal Gait - grossly normal Balance - grossly normal Cranial Nerves: I: smell Not tested  II: visual acuity  OS: Normal  OD: Normal   II: visual fields Full to confrontation  II: pupils Equal, round, reactive to light  III,VII: ptosis None  III,IV,VI: extraocular muscles  Full ROM  V: mastication Normal  V: facial light touch sensation  Normal  V,VII: corneal reflex  Present  VII: facial muscle function - upper  Normal  VII: facial muscle function - lower Normal  VIII: hearing Not tested  IX: soft palate elevation  Normal  IX,X: gag reflex Present  XI: trapezius strength  5/5  XI: sternocleidomastoid strength 5/5  XI: neck flexion strength  5/5  XII: tongue strength  Normal    Data Review Lab Results  Component Value Date   WBC 6.4 06/17/2014   HGB 12.7 06/17/2014   HCT 38.4 06/17/2014   MCV 87.3 06/17/2014   PLT 195 06/17/2014   Lab Results  Component Value Date   NA 139 06/17/2014   K 3.7 06/17/2014   CL 100 06/17/2014   CO2 26 06/17/2014   BUN 11 06/17/2014   CREATININE 0.77 06/17/2014   GLUCOSE 95 06/17/2014   No results found for this basename: INR, PROTIME    Assessment/Plan: Cerebral spinal fluid fistula: I discussed situation with the patient and her mother. We have discussed the various treatment options. I recommended that the patient be admitted for observation and placement of lumbar drain with over sewing of her wound. I explained the procedure and the risks, benefits, and alternatives. We  discussed the likelihood of achieving our goals. I have answered all the patient's questions. She has decided proceed with this procedure.   Lakeva Hollon D 07/07/2014 7:09 PM

## 2014-07-07 NOTE — Progress Notes (Signed)
Subjective:  The patient is somnolent but easily arousable. She is in no apparent distress.  Objective: Vital signs in last 24 hours: Temp:  [98.7 F (37.1 C)-99 F (37.2 C)] 98.7 F (37.1 C) (08/27 2009) Pulse Rate:  [94-109] 109 (08/27 2009) Resp:  [14-18] 14 (08/27 2009) BP: (138-160)/(80-96) 138/80 mmHg (08/27 2009) SpO2:  [100 %] 100 % (08/27 2009) Weight:  [116.121 kg (256 lb)] 116.121 kg (256 lb) (08/27 1825)  Intake/Output from previous day:   Intake/Output this shift: Total I/O In: 750 [I.V.:750] Out: -   Physical exam the patient is somnolent but arousable. She is moving all 4 extremities well.  Lab Results: No results found for this basename: WBC, HGB, HCT, PLT,  in the last 72 hours BMET No results found for this basename: NA, K, CL, CO2, GLUCOSE, BUN, CREATININE, CALCIUM,  in the last 72 hours  Studies/Results: No results found.  Assessment/Plan: The patient is doing well.  LOS: 0 days     Neidy Guerrieri D 07/07/2014, 8:15 PM

## 2014-07-07 NOTE — Anesthesia Procedure Notes (Addendum)
Anesthesia Regional Block:   Narrative:    Procedure Name: MAC Date/Time: 07/07/2014 7:15 PM Performed by: Eligha Bridegroom Pre-anesthesia Checklist: Patient identified, Timeout performed, Suction available, Emergency Drugs available and Patient being monitored Patient Re-evaluated:Patient Re-evaluated prior to inductionOxygen Delivery Method: Nasal cannula Intubation Type: IV induction

## 2014-07-07 NOTE — Transfer of Care (Signed)
Immediate Anesthesia Transfer of Care Note  Patient: Morgan Kent  Procedure(s) Performed: Procedure(s): PLACEMENT OF LUMBAR DRAIN AND CLOSURE OF CERVICAL INCISION (N/A)  Patient Location: PACU  Anesthesia Type:MAC  Level of Consciousness: awake, alert  and oriented  Airway & Oxygen Therapy: Patient Spontanous Breathing and Patient connected to nasal cannula oxygen  Post-op Assessment: Report given to PACU RN and Post -op Vital signs reviewed and stable  Post vital signs: Reviewed and stable  Complications: No apparent anesthesia complications

## 2014-07-07 NOTE — Op Note (Signed)
Brief history: The patient is a 33 year old black female on whom I performed a redo Chiari decompression approximately 2 weeks ago. I took the patient sutures  out 2 days ago. The patient began draining watery fluid from her wound last evening. I admitted her and recommended placement of lumbar drain with oversewing of the wound. The patient weighed the risks, benefits, and alternatives surgery and decided proceed with the procedure.  Preop diagnosis: CSF fistula  Postop diagnosis: The same  Procedure: Placement of lumbar drain; oversewing of the cervical wound  Surgeon: Dr. Earle Gell  Asst.: None  Anesthesia: MAC  Estimated blood loss: Minimal  Specimens: None  Complications: None  Description of procedure: The patient was brought to the operating room by the anesthesia team. MAC anesthesia was induced. The patient was turned to the prone position on the Wilson frame. The patient's cervical and lumbar region was then prepared with DuraPrep. Sterile drapes were applied. Under fluoroscopic guidance I inserted the Touhy needle into the L3-4 interspace. Removed the stylette and there was good flow of spinal fluid. I threaded the lumbar drain through the needle and removed the needle. I then removed the guidewire from the lumbar drain. There was good flow of cerebral spinal fluid through the distal end of the catheter. I secured the catheter to the connector and placed a drainage bag.  We now turned attention to the cervical wound. The patient had a small amount of drainage from the cephalad aspect of the wound. I oversewed this with a running 2-0 nylon suture.  We then applied bacitracin solution to the drain exit site and the cervical wound. Sterile dressings were applied. The drapes were removed. The patient was subtotally returned to the supine position and transported to the post anesthesia care unit in stable condition. By report all sponge, instrument, and needle counts were correct at  the end this case.

## 2014-07-08 ENCOUNTER — Encounter (HOSPITAL_COMMUNITY): Payer: Self-pay | Admitting: Neurosurgery

## 2014-07-08 MED ORDER — PNEUMOCOCCAL VAC POLYVALENT 25 MCG/0.5ML IJ INJ
0.5000 mL | INJECTION | INTRAMUSCULAR | Status: AC
  Administered 2014-07-09: 0.5 mL via INTRAMUSCULAR
  Filled 2014-07-08: qty 0.5

## 2014-07-08 NOTE — Progress Notes (Signed)
Called Dr. Vertell Limber to clarify lumbar drain orders. Instructed for lumbar drain to be mid bed level and to have goal of 10-15cc/hr. Raise if necessary. Pt head to be 30 degrees. Will monitor

## 2014-07-08 NOTE — Progress Notes (Signed)
PT Cancellation Note  Patient Details Name: Morgan Kent MRN: [id number] DOB: Jul 12, 1981   Cancelled Treatment:    Reason Eval/Treat Not Completed: Patient not medically ready, per nsg to remain bedrest today.   Duncan Dull 07/08/2014, 11:04 AM Alben Deeds, PT DPT  [phone]

## 2014-07-08 NOTE — Progress Notes (Signed)
Lumbar drain changed at this time.

## 2014-07-08 NOTE — Progress Notes (Signed)
UR completed.  Adel Neyer, RN BSN [facility] CCM Trauma/Neuro ICU Case Manager [phone]  

## 2014-07-08 NOTE — Progress Notes (Signed)
UR completed.  Kayani Rapaport, RN BSN [facility] CCM Trauma/Neuro ICU Case Manager [phone]  

## 2014-07-08 NOTE — Progress Notes (Signed)
Patient ID: Morgan Kent, female   DOB: 13-Feb-1981, 33 y.o.   MRN: [id number] Subjective:  The patient is alert and pleasant. She has a headache.  Objective: Vital signs in last 24 hours: Temp:  [98.7 F (37.1 C)-99 F (37.2 C)] 98.7 F (37.1 C) (08/28 0400) Pulse Rate:  [76-109] 85 (08/28 0700) Resp:  [11-23] 14 (08/28 0700) BP: (117-160)/(76-99) 133/83 mmHg (08/28 0700) SpO2:  [95 %-100 %] 95 % (08/28 0700) Weight:  [112.9 kg (248 lb 14.4 oz)-116.121 kg (256 lb)] 112.9 kg (248 lb 14.4 oz) (08/28 0400)  Intake/Output from previous day: 08/27 0701 - 08/28 0700 In: 1582.5 [I.V.:1382.5; IV Piggyback:200] Out: 9379 [Urine:750; Drains:275] Intake/Output this shift:    Physical exam the patient is alert and oriented x3. She is moving all 4 extremities well. Her dressing is clean and dry. Her lumbar drain is draining  Lab Results: No results found for this basename: WBC, HGB, HCT, PLT,  in the last 72 hours BMET No results found for this basename: NA, K, CL, CO2, GLUCOSE, BUN, CREATININE, CALCIUM,  in the last 72 hours  Studies/Results: Dg Lumbar Spine 1 View  07/07/2014   CLINICAL DATA:  Placement of lumbar drain for CSF leak.  EXAM: DG C-ARM 61-120 MIN; LUMBAR SPINE - 1 VIEW  TECHNIQUE: Fluoroscopic evaluation.  FLUOROSCOPY TIME:  11 seconds  COMPARISON:  None  FINDINGS: Needle projects over the lower lumbar spine at the intervertebral disc space. Visualized lumbar spine is unremarkable.  IMPRESSION: Fluoroscopic evaluation for lumbar drain placement.   Electronically Signed   By: Lovey Newcomer M.D.   On: 07/07/2014 20:14   Dg C-arm 1-60 Min  07/07/2014   CLINICAL DATA:  Placement of lumbar drain for CSF leak.  EXAM: DG C-ARM 61-120 MIN; LUMBAR SPINE - 1 VIEW  TECHNIQUE: Fluoroscopic evaluation.  FLUOROSCOPY TIME:  11 seconds  COMPARISON:  None  FINDINGS: Needle projects over the lower lumbar spine at the intervertebral disc space. Visualized lumbar spine is unremarkable.  IMPRESSION:  Fluoroscopic evaluation for lumbar drain placement.   Electronically Signed   By: Lovey Newcomer M.D.   On: 07/07/2014 20:14    Assessment/Plan: CSF fistula status post Chiari decompression: The patient is doing well with a lumbar drain. There has been no more leakage. I will plan to continue his throughout the weekend and likely clamp it on Monday. We will keep the head of bed at 30 with a drainage bag opened and below her neck. She may have bathroom privileges with the drain temporarily closed.  LOS: 1 day     Morgan Kent D 07/08/2014, 8:01 AM

## 2014-07-09 MED ORDER — PANTOPRAZOLE SODIUM 40 MG PO TBEC
40.0000 mg | DELAYED_RELEASE_TABLET | Freq: Every day | ORAL | Status: DC
  Administered 2014-07-09 – 2014-07-11 (×3): 40 mg via ORAL
  Filled 2014-07-09 (×3): qty 1

## 2014-07-09 NOTE — Progress Notes (Signed)
PT Cancellation Note  Patient Details Name: Morgan Kent MRN: [id number] DOB: [birth year]   Cancelled Treatment:    Reason Eval/Treat Not Completed: Medical issues which prohibited therapy.  Rn managing drain adequately at this point and only bathroom privileges at this point.  Await pulling drain on Monday. 07/09/2014  [name], [place] [phone]  (pager)   [name], [name] 07/09/2014, 10:48 AM

## 2014-07-09 NOTE — Progress Notes (Signed)
Pt seen and examined. No issues overnight. She reports some HA and muscle spasms in the legs. No drainage from her neck.  EXAM: Temp:  [98.1 F (36.7 C)-98.6 F (37 C)] 98.2 F (36.8 C) (08/29 0846) Pulse Rate:  [67-90] 80 (08/29 0700) Resp:  [10-25] 18 (08/29 0900) BP: (125-152)/(67-101) 147/101 mmHg (08/29 0900) SpO2:  [92 %-98 %] 95 % (08/29 0700) Intake/Output     08/28 0701 - 08/29 0700 08/29 0701 - 08/30 0700   P.O. 965    I.V. (mL/kg) 75 (0.7)    IV Piggyback     Total Intake(mL/kg) 1040 (9.2)    Urine (mL/kg/hr) 1275 (0.5)    Drains 275 (0.1)    Total Output 1550     Net -510          Urine Occurrence 4 x    Stool Occurrence 1 x     Awake, alert, oriented Speech fluent MAE well Wound clean, dry. No leak  LABS: Lab Results  Component Value Date   CREATININE 0.77 06/17/2014   BUN 11 06/17/2014   NA 139 06/17/2014   K 3.7 06/17/2014   CL 100 06/17/2014   CO2 26 06/17/2014   Lab Results  Component Value Date   WBC 6.4 06/17/2014   HGB 12.7 06/17/2014   HCT 38.4 06/17/2014   MCV 87.3 06/17/2014   PLT 195 06/17/2014    IMPRESSION: - 33 y.o. female s/p Chiari decompression with postop CSF leak controlled with LD and diversion  PLAN: - Cont drainage through weekend - Can sit up to 45 degrees while eating and OOB for bathroom with drain clamped - Schedule valium around the clock for spasms.

## 2014-07-10 NOTE — Progress Notes (Signed)
Pt seen and examined. No issues overnight. Has some HA.  EXAM: Temp:  [97.1 F (36.2 C)-98.7 F (37.1 C)] 97.8 F (36.6 C) (08/30 0809) Pulse Rate:  [64-107] 107 (08/30 0600) Resp:  [14-18] 15 (08/30 0800) BP: (113-147)/(67-101) 136/82 mmHg (08/30 0800) SpO2:  [93 %-100 %] 99 % (08/30 0700) Weight:  [110.6 kg (243 lb 13.3 oz)] 110.6 kg (243 lb 13.3 oz) (08/30 0500) Intake/Output     08/29 0701 - 08/30 0700 08/30 0701 - 08/31 0700   P.O. 1160    I.V. (mL/kg)     Total Intake(mL/kg) 1160 (10.5)    Urine (mL/kg/hr) 2175 (0.8) 350 (1.6)   Drains 300 (0.1)    Total Output 2475 350   Net -1315 -350        Urine Occurrence 4 x 1 x    Awake, alert, oriented MAE well LD in place Cervical wound dry, bedding is dry  LABS: Lab Results  Component Value Date   CREATININE 0.77 06/17/2014   BUN 11 06/17/2014   NA 139 06/17/2014   K 3.7 06/17/2014   CL 100 06/17/2014   CO2 26 06/17/2014   Lab Results  Component Value Date   WBC 6.4 06/17/2014   HGB 12.7 06/17/2014   HCT 38.4 06/17/2014   MCV 87.3 06/17/2014   PLT 195 06/17/2014    IMPRESSION: - 33 y.o. female with LD for CSF leak after repeat Chiari decompression - No leak with CSF diversion now for 3 days  PLAN: - Cont drainage and bedrest till tomorrow

## 2014-07-11 NOTE — Progress Notes (Signed)
Patient ID: Morgan Kent, female   DOB: [birth year], 33 y.o.   MRN: [id number] Subjective:  The patient is alert and pleasant. She looks well. She has a mild headache. She wants to go home.  Objective: Vital signs in last 24 hours: Temp:  [97.4 F (36.3 C)-98.5 F (36.9 C)] 98 F (36.7 C) (08/31 1159) Pulse Rate:  [67-110] 70 (08/31 1200) Resp:  [12-29] 28 (08/31 1200) BP: (102-143)/(60-86) 103/71 mmHg (08/31 1200) SpO2:  [93 %-99 %] 98 % (08/31 1200) Weight:  [109.6 kg (241 lb 10 oz)] 109.6 kg (241 lb 10 oz) (08/31 0400)  Intake/Output from previous day: 08/30 0701 - 08/31 0700 In: 680 [P.O.:680] Out: 1800 [Urine:1500; Drains:300] Intake/Output this shift: Total I/O In: 360 [P.O.:360] Out: -   Physical exam patient is alert and oriented x3. Her dressing is clean and dry. There is no drainage. Her strength is normal.  Lab Results: No results found for this basename: WBC, HGB, HCT, PLT,  in the last 72 hours BMET No results found for this basename: NA, K, CL, CO2, GLUCOSE, BUN, CREATININE, CALCIUM,  in the last 72 hours  Studies/Results: No results found.  Assessment/Plan: Hospital day #4: The patient has done well with conversion of cervical spinal fluid via a lumbar drain. We will clamp the drain and mobilize the patient. If she has no drainage tomorrow then I will remove the lumbar drain in discharge her.  LOS: 4 days     Morgan Kent D 07/11/2014, 1:18 PM

## 2014-07-11 NOTE — Evaluation (Signed)
Physical Therapy Evaluation Patient Details Name: Morgan Kent MRN: [id number] DOB: 05-Oct-1981 Today's Date: 07/11/2014   History of Present Illness  Patient is a 33 yo female with recurrent Chiari malformation and syringomelia, s/p decompression and duraplasty who presented to MD with fluid leakage in the office and is now s/p [facility]   Clinical Impression  Patient with deficits in functional mobility as indicated below. Will benefit from continued skilled PT to address deficits and maximize function. Patient with need for reinforced education on precautions for mobility and demonstrates some modest instability and higher level balance deficits. Will see as indicated and progress as tolerated.  Encouraged continued mobility with staff.    Follow Up Recommendations No PT follow up;Supervision - Intermittent    Equipment Recommendations  Rolling walker with 5" wheels    Recommendations for Other Services       Precautions / Restrictions Precautions Precautions:  (reinforced cervical and back precautions for comfort) Restrictions Weight Bearing Restrictions: No      Mobility  Bed Mobility Overal bed mobility: Supervision Verbal cues for technique and sequencing for log roll and side-lying positioning.               General bed mobility comments: incr time to perform log rolling technique  Transfers Overall transfer level: Modified independent Equipment used: None             General transfer comment: no LOB or c/o dizziness  Ambulation/Gait Ambulation/Gait assistance: Supervision Ambulation Distance (Feet): 600 Feet Assistive device: None Gait Pattern/deviations: Step-through pattern;Decreased stride length Gait velocity: decreased Gait velocity interpretation: Below normal speed for age/gender General Gait Details: no LOB noted with ambulation; pt encouraged to continue gt; required max encouragment to  progress  Stairs            Wheelchair Mobility    Modified Rankin (Stroke Patients Only)       Balance Overall balance assessment:  (modest deficits in balance secodnary to pacing and pain)                                           Pertinent Vitals/Pain Pain Assessment: 0-10 Pain Score: 5  Pain Location: headache and muscle spasm Pain Descriptors / Indicators: Constant;Headache;Spasm Pain Intervention(s): Repositioned;Relaxation    Home Living Family/patient expects to be discharged to:: Private residence Living Arrangements: Children Available Help at Discharge: Family Type of Home: House Home Access: Ramped entrance     [place]: Two level;Able to live on main level with bedroom/bathroom Home Equipment: Kasandra Knudsen - single point      Prior Function Level of Independence: Independent with assistive device(s)               Hand Dominance   Dominant Hand: Right    Extremity/Trunk Assessment               Lower Extremity Assessment: Overall WFL for tasks assessed         Communication   Communication:  (wears glasses)  Cognition Arousal/Alertness: Awake/alert Behavior During Therapy: WFL for tasks assessed/performed Overall Cognitive Status: Within Functional Limits for tasks assessed                      General Comments General comments (skin integrity, edema, etc.): patient educated on techniques for log roll for bed mobility, educated  on mobility expectations as well as pain management and spasm techniques.     Exercises        Assessment/Plan    PT Assessment Patient needs continued PT services  PT Diagnosis Difficulty walking;Abnormality of gait;Acute pain   PT Problem List Decreased activity tolerance;Decreased balance;Decreased mobility  PT Treatment Interventions DME instruction;Gait training;Stair training;Functional mobility training;Therapeutic activities;Therapeutic exercise;Balance  training;Patient/family education   PT Goals (Current goals can be found in the Care Plan section) Acute Rehab PT Goals Patient Stated Goal: to not have pain PT Goal Formulation: With patient Time For Goal Achievement: 07/08/14 Potential to Achieve Goals: Good    Frequency Min 3X/week   Barriers to discharge        Co-evaluation               End of Session Equipment Utilized During Treatment: Gait belt Activity Tolerance: Patient tolerated treatment well Patient left: in bed;with call bell/phone within reach Nurse Communication: Mobility status         Time: 1354-1420 PT Time Calculation (min): 26 min   Charges:   PT Evaluation $Initial PT Evaluation Tier I: 1 Procedure PT Treatments $Gait Training: 8-22 mins $Therapeutic Activity: 8-22 mins   PT G CodesDuncan Dull 07/11/2014, 2:32 PM Alben Deeds, [place] DPT  [phone]

## 2014-07-12 MED ORDER — OXYCODONE-ACETAMINOPHEN 10-325 MG PO TABS: 1.0000 | ORAL_TABLET | ORAL | Status: DC | PRN

## 2014-07-12 MED ORDER — DIAZEPAM 5 MG PO TABS: 5.0000 mg | ORAL_TABLET | Freq: Four times a day (QID) | ORAL | Status: DC | PRN

## 2014-07-12 MED ORDER — DSS 100 MG PO CAPS: 100.0000 mg | ORAL_CAPSULE | Freq: Two times a day (BID) | ORAL | Status: DC

## 2014-07-12 NOTE — Progress Notes (Signed)
Pt given DC paperwork and all questions answered. Taken out via wheelchair in no distress.

## 2014-07-12 NOTE — Progress Notes (Signed)
Physical Therapy Treatment Patient Details Name: SKI POLICH MRN: [id number] DOB: 1980/11/30 Today's Date: 08-04-2014    History of Present Illness Patient is a 33 yo female with recurrent Chiari malformation and syringomelia, s/p decompression and duraplasty who presented to MD with fluid leakage in the office and is now s/p PLACEMENT OF LUMBAR DRAIN AND CLOSURE OF CERVICAL INCISION     PT Comments    Patient ambulated well, performed stair negotiation and discussed mobility expectations for discharge home.  Follow Up Recommendations  No PT follow up;Supervision - Intermittent     Equipment Recommendations  None recommended by PT    Recommendations for Other Services       Precautions / Restrictions Precautions Precautions:  (reinforced cervical and back precautions for comfort) Restrictions Weight Bearing Restrictions: No    Mobility  Bed Mobility Overal bed mobility: Modified Independent             General bed mobility comments: incr time to perform log rolling technique  Transfers Overall transfer level: Modified independent Equipment used: None             General transfer comment: no LOB or c/o dizziness  Ambulation/Gait Ambulation/Gait assistance: Modified independent (Device/Increase time) Ambulation Distance (Feet): 540 Feet Assistive device: None   Gait velocity: improved Gait velocity interpretation: at or above normal speed for age/gender General Gait Details: steady    Stairs Stairs: Yes Stairs assistance: Supervision Stair Management: One rail Right;Step to pattern Number of Stairs: 12 General stair comments: no physical assist, VCs for sequencing   Wheelchair Mobility    Modified Rankin (Stroke Patients Only)       Balance                                    Cognition Arousal/Alertness: Awake/alert Behavior During Therapy: WFL for tasks assessed/performed Overall Cognitive Status: Within Functional Limits  for tasks assessed                      Exercises      General Comments        Pertinent Vitals/Pain Pain Assessment: 0-10 Pain Score: 6  Pain Descriptors / Indicators: Aching Pain Intervention(s): Monitored during session;Premedicated before session    Home Living                      Prior Function            PT Goals (current goals can now be found in the care plan section) Acute Rehab PT Goals Patient Stated Goal: to not have pain PT Goal Formulation: With patient Time For Goal Achievement: 07/08/14 Potential to Achieve Goals: Good Progress towards PT goals: Progressing toward goals    Frequency  Min 3X/week    PT Plan Current plan remains appropriate    Co-evaluation             End of Session Equipment Utilized During Treatment: Gait belt Activity Tolerance: Patient tolerated treatment well Patient left: in chair;with call bell/phone within reach;with family/visitor present     Time: 1962-2297 PT Time Calculation (min): 16 min  Charges:  $Self Care/Home Management: 8-22                    G CodesDuncan Dull 08/04/14, 10:03 AM Alben Deeds, PT DPT  [phone]

## 2014-07-12 NOTE — Discharge Summary (Signed)
Physician Discharge Summary  Patient ID: Morgan Kent MRN: [id number] DOB/AGE: 11/30/1980 33 y.o.  Admit date: 07/07/2014 Discharge date: 07/12/2014  Admission Diagnoses: CSF fistula  Discharge Diagnoses: The same Active Problems:   Postprocedural pseudomeningocele   Discharged Condition: good  Hospital Course: The patient was in mid on May 20 7:15 with a CSF fistula/leak from her Chiari decompression. Placed a lumbar drain and oversewed her wound on 07/07/2014.  The patient was observed in the ICU with a lumbar drain open. He had no further leakage from her wound.) Yesterday I observed her there was no further leakage. I removed the drain today. Patient requested discharge to home. She was given oral and written discharge instructions. All her questions were answered. She was instructed to follow me in a week in the office to remove the sutures.  Consults: None Significant Diagnostic Studies: None Treatments: Placement of lumbar drain, closure of wound. Discharge Exam: Blood pressure 97/60, pulse 91, temperature 98.2 F (36.8 C), temperature source Oral, resp. rate 12, height 5\' 6"  (1.676 m), weight 109.7 kg (241 lb 13.5 oz), last menstrual period 06/01/2014, SpO2 97.00%. The patient is alert and pleasant. She looks well. Her strength is normal. Her wound is healing well without any drainage.  Disposition: Home  Discharge Instructions   Call MD for:  difficulty breathing, headache or visual disturbances    Complete by:  As directed      Call MD for:  extreme fatigue    Complete by:  As directed      Call MD for:  hives    Complete by:  As directed      Call MD for:  persistant dizziness or light-headedness    Complete by:  As directed      Call MD for:  persistant nausea and vomiting    Complete by:  As directed      Call MD for:  redness, tenderness, or signs of infection (pain, swelling, redness, odor or green/yellow discharge around incision site)    Complete by:  As  directed      Call MD for:  severe uncontrolled pain    Complete by:  As directed      Call MD for:  temperature >100.4    Complete by:  As directed      Diet - low sodium heart healthy    Complete by:  As directed      Discharge instructions    Complete by:  As directed   Call [phone] for a followup appointment. Take a stool softener while you are using pain medications.     Driving Restrictions    Complete by:  As directed   Do not drive for 2 weeks.     Increase activity slowly    Complete by:  As directed      Lifting restrictions    Complete by:  As directed   Do not lift more than 5 pounds. No excessive bending or twisting.     May shower / Bathe    Complete by:  As directed   He may shower after the pain she is removed 3 days after surgery. Leave the incision alone.     Remove dressing in 48 hours    Complete by:  As directed   Your stitches are under the scan and will dissolve by themselves. The Steri-Strips will fall off after you take a few showers. Do not rub back or pick at the wound, Leave the wound alone.  Medication List    STOP taking these medications       oxyCODONE-acetaminophen 5-325 MG per tablet  Commonly known as:  PERCOCET/ROXICET  Replaced by:  oxyCODONE-acetaminophen 10-325 MG per tablet      TAKE these medications       adapalene 0.1 % gel  Commonly known as:  DIFFERIN  Apply 1 application topically at bedtime. Apply to forehead and chin     antiseptic oral rinse Liqd  15 mLs by Mouth Rinse route 2 (two) times daily.     atenolol 25 MG tablet  Commonly known as:  TENORMIN  Take 25 mg by mouth every morning.     cetirizine 10 MG tablet  Commonly known as:  ZYRTEC  Take 10 mg by mouth daily.     clobetasol 0.05 % topical foam  Commonly known as:  OLUX  Apply 1 application topically at bedtime as needed (for scalp). Apply all over body     clobetasol 0.05 % external solution  Commonly known as:  TEMOVATE  Apply 1  application topically 2 (two) times daily. Apply to scalp     desonide 0.05 % cream  Commonly known as:  DESOWEN  Apply 1 application topically daily as needed (for lupus). Apply all over body     diazepam 5 MG tablet  Commonly known as:  VALIUM  Take 1 tablet (5 mg total) by mouth every 6 (six) hours as needed for muscle spasms.     diclofenac sodium 1 % Gel  Commonly known as:  VOLTAREN  Apply 1 application topically daily as needed (for pain).     DSS 100 MG Caps  Take 100 mg by mouth 2 (two) times daily.     DULoxetine 60 MG capsule  Commonly known as:  CYMBALTA  Take 60 mg by mouth daily.     fluticasone 50 MCG/ACT nasal spray  Commonly known as:  FLONASE  Place 1 spray into both nostrils at bedtime.     gabapentin 300 MG capsule  Commonly known as:  NEURONTIN  Take 300 mg by mouth 3 (three) times daily.     hydrochlorothiazide 25 MG tablet  Commonly known as:  HYDRODIURIL  Take 25 mg by mouth daily.     hydroxychloroquine 200 MG tablet  Commonly known as:  PLAQUENIL  Take 200 mg by mouth 2 (two) times daily.     meloxicam 7.5 MG tablet  Commonly known as:  MOBIC  Take 7.5 mg by mouth 2 (two) times daily as needed (joint pain).     montelukast 10 MG tablet  Commonly known as:  SINGULAIR  Take 10 mg by mouth at bedtime.     multivitamin with minerals Tabs tablet  Take 1 tablet by mouth daily.     nortriptyline 10 MG capsule  Commonly known as:  PAMELOR  Take 40 mg by mouth at bedtime.     oxyCODONE-acetaminophen 10-325 MG per tablet  Commonly known as:  PERCOCET  Take 1 tablet by mouth every 4 (four) hours as needed for pain.     SHAROBEL 0.35 MG tablet  Generic drug:  norethindrone  Take 1 tablet by mouth daily.     SYSTANE ULTRA 0.4-0.3 % Soln  Generic drug:  Polyethyl Glycol-Propyl Glycol  Place 1 drop into both eyes daily as needed (for dry eyes).     traZODone 50 MG tablet  Commonly known as:  DESYREL  Take 12.5 mg by mouth at bedtime. 1/4  tablet  SignedNewman Pies D 07/12/2014, 7:35 AM

## 2014-07-21 ENCOUNTER — Ambulatory Visit: Payer: Medicaid Other | Admitting: Neurology

## 2014-08-04 DIAGNOSIS — IMO0002 Reserved for concepts with insufficient information to code with codable children: Secondary | ICD-10-CM | POA: Insufficient documentation

## 2014-08-04 DIAGNOSIS — R202 Paresthesia of skin: Secondary | ICD-10-CM

## 2014-08-04 DIAGNOSIS — R200 Anesthesia of skin: Secondary | ICD-10-CM | POA: Insufficient documentation

## 2014-08-04 DIAGNOSIS — G479 Sleep disorder, unspecified: Secondary | ICD-10-CM | POA: Insufficient documentation

## 2014-09-12 ENCOUNTER — Ambulatory Visit: Payer: Medicaid Other | Admitting: Neurology

## 2014-09-14 ENCOUNTER — Encounter: Payer: Self-pay | Admitting: *Deleted

## 2014-09-15 ENCOUNTER — Encounter: Payer: Self-pay | Admitting: Neurology

## 2014-09-15 ENCOUNTER — Ambulatory Visit (INDEPENDENT_AMBULATORY_CARE_PROVIDER_SITE_OTHER): Payer: Medicaid Other | Admitting: Neurology

## 2014-09-15 VITALS — BP 130/80 | HR 94 | Ht 66.0 in | Wt 252.0 lb

## 2014-09-15 DIAGNOSIS — G950 Syringomyelia and syringobulbia: Secondary | ICD-10-CM

## 2014-09-15 DIAGNOSIS — G5603 Carpal tunnel syndrome, bilateral upper limbs: Secondary | ICD-10-CM

## 2014-09-15 DIAGNOSIS — G935 Compression of brain: Secondary | ICD-10-CM

## 2014-09-15 DIAGNOSIS — R209 Unspecified disturbances of skin sensation: Secondary | ICD-10-CM

## 2014-09-15 DIAGNOSIS — G5602 Carpal tunnel syndrome, left upper limb: Secondary | ICD-10-CM

## 2014-09-15 DIAGNOSIS — G5601 Carpal tunnel syndrome, right upper limb: Secondary | ICD-10-CM

## 2014-09-15 NOTE — Patient Instructions (Signed)
1.  EMG of the arms 2.  Agree with referral to Pain management 3.  Results of EMG will be communicated via telephone 4.  Return to clinic as needed

## 2014-09-15 NOTE — Progress Notes (Addendum)
[facility] Clinic Note - Initial Visit   Date: 09/15/2014  Morgan Kent MRN: [id number] DOB: 1981/03/05   Dear Dr. Radford Pax:  Thank you for your kind referral of Morgan Kent for consultation of generalized pain and paresthesias. Although her history is well known to you, please allow [place] to reiterate it for the purpose of our medical record. The patient was accompanied to the clinic by mother who also provides collateral information.     History of Present Illness: Morgan Kent is a 33 y.o. right-handed African American female with history of Chiari I malformation with syrinx s/p decompression craniectomy (2007) and redo (7673, 4193) complicated by CSF fistula/leak (06/2014), left CTS s/p release (03/2013), lupus, hypertension, and depression/anxiety presenting for evaluation of generalized pain involving her neck and upper extremities.    In 2006, she developed worsening headaches and subsequent imaging showed Chiari I malformation in 2006 and had her first decompression in 2007.  She did not feel as if she has significant benefit of her migraines. In early 2014, her headaches and neck pain worsened so she had redo decompression by Dr. Arnoldo Morale which again did not help. Per patient, in August 2015, her imaging shows worsening syrinx so underwent surgery again.  This was complicated by CSF fistula for which lumbar drain was placed. Despite surgical decompression, she reports that none of her symptoms have really improved. Currently, she complains of chronic low back, neck, shoulders, and hand pain.  Pain is described as sharp stabbing pain involving the head, neck, and shoulders, and tingling paresthesias of her fingers.It is worse with neck flexion so has been wearing a soft collar for comfort.    Since 2006, she denies no improvement of headaches, neck pain, or hand parethesias.  She has been seeing Dr. Melrose Nakayama for sympatomic management who has tried optimizing her  medication.  Currently, she takes neurontin 600mg /300mg /300mg , cymbalta 60mg  daily, valium 5mg  every 8 hours, nortriptyline 40mg  daily, and oxycodone 10 mg twice daily.  She has completed neck physical therapy in the past and is currently only taking an appointment with pain management.  Because her symptoms have been difficult to manage, she has been referred here for neuromuscular evaluation. Of note, she underwent left CTS release earlier this year without any improvement of her hand paresthesias and has been recommended to have the right side released.  She does report having prior electrodiagnostic testing, however this is not available for my review.  Other speciality providers include:  Rheumatology (Dr. Percell Boston) for lupus, [facility] (Dr. Tammi Klippel, Dorise Hiss PA) for CTS, neurology (Dr. Gurney Maxin), neurosurgery (Dr. Frederich Cha)   Out-side paper records, electronic medical record, and images have been reviewed where available and summarized as:   MRI cervical spine 07/30/2013:  Cerebellar ectopia and syrinx of the cervical spine final cord extending from C3 to the level of C5. These findings can be seen with Chiari I malformation  MRI brain without contrast 05/24/2013: No focal signal abnormality. No focal mass or mass effect. No hemorrhage or infarct.  MRI cervical spine wwo contrast 02/28/2006: 1. Status post suboccipital craniectomy and partial resection of the posterior arches of C1 and C2. 2. The extensive cord syrinx from C3 to T3 seen previously has decompressed. There is slight prominence of the central canal in that region but this is a marked improvement. The cord shows some atrophic changes in general throughout that region but there is no focal process such as cord infarct or focal myelitis.  MRI cervical spine wwo contrast 2006: Significant Chiari I malformation with 16 mm of tonsillar herniation and significant syrinx expanding the cord from C2 downward  to T3; see comments above.    Past Medical History  Diagnosis Date  . PONV (postoperative nausea and vomiting)   . Hypertension   . Depression   . Anxiety   . History of kidney stones   . Headache(784.0)   . Dizziness   . Arthritis     rheumo possible  . Lupus   . Chiari malformation     Past Surgical History  Procedure Laterality Date  . Craniotomy  14,06    chiari  . Carpel tunnel  Left 2014  . Dental surgery    . Hernia repair      umbilical  . Suboccipital craniectomy cervical laminectomy N/A 09/29/2013    Procedure: SUBOCCIPITAL CRANIECTOMY CERVICAL LAMINECTOMY/DURAPLASTY;  Surgeon: Ophelia Charter, MD;  Location: [facility];  Service: Neurosurgery;  Laterality: N/A;  posterior  . Suboccipital craniectomy cervical laminectomy N/A 06/23/2014    Procedure: SUBOCCIPITAL CRANIECTOMY CERVICAL LAMINECTOMY/DURAPLASTY;  Surgeon: Newman Pies, MD;  Location: [facility];  Service: Neurosurgery;  Laterality: N/A;  suboccipital craniectomy with cervical laminectomy and duraplasty  . Placement of lumbar drain N/A 07/07/2014    Procedure: PLACEMENT OF LUMBAR DRAIN AND CLOSURE OF CERVICAL INCISION;  Surgeon: Newman Pies, MD;  Location: [facility];  Service: Neurosurgery;  Laterality: N/A;     Medications:  Current Outpatient Prescriptions on File Prior to Visit  Medication Sig Dispense Refill  . adapalene (DIFFERIN) 0.1 % gel Apply 1 application topically at bedtime. Apply to forehead and chin    . antiseptic oral rinse (BIOTENE) LIQD 15 mLs by Mouth Rinse route 2 (two) times daily.     Marland Kitchen atenolol (TENORMIN) 25 MG tablet Take 25 mg by mouth every morning.     . cetirizine (ZYRTEC) 10 MG tablet Take 10 mg by mouth daily.    . clobetasol (OLUX) 0.05 % topical foam Apply 1 application topically at bedtime as needed (for scalp). Apply all over body    . clobetasol (TEMOVATE) 0.05 % external solution Apply 1 application topically 2 (two) times daily. Apply to scalp    .  desonide (DESOWEN) 0.05 % cream Apply 1 application topically daily as needed (for lupus). Apply all over body    . diazepam (VALIUM) 5 MG tablet Take 1 tablet (5 mg total) by mouth every 6 (six) hours as needed for muscle spasms. 100 tablet 0  . diclofenac sodium (VOLTAREN) 1 % GEL Apply 1 application topically daily as needed (for pain).     Marland Kitchen docusate sodium 100 MG CAPS Take 100 mg by mouth 2 (two) times daily. 60 capsule 0  . DULoxetine (CYMBALTA) 60 MG capsule Take 60 mg by mouth daily.    . fluticasone (FLONASE) 50 MCG/ACT nasal spray Place 1 spray into both nostrils at bedtime.     . gabapentin (NEURONTIN) 300 MG capsule Take 300 mg by mouth 3 (three) times daily.    . hydrochlorothiazide (HYDRODIURIL) 25 MG tablet Take 25 mg by mouth daily.    . hydroxychloroquine (PLAQUENIL) 200 MG tablet Take 200 mg by mouth daily.     . meloxicam (MOBIC) 7.5 MG tablet Take 7.5 mg by mouth 2 (two) times daily as needed (joint pain).    . montelukast (SINGULAIR) 10 MG tablet Take 10 mg by mouth at bedtime.    . Multiple Vitamin (MULTIVITAMIN WITH MINERALS)  TABS tablet Take 1 tablet by mouth daily.    . norethindrone (SHAROBEL) 0.35 MG tablet Take 1 tablet by mouth daily.    . nortriptyline (PAMELOR) 10 MG capsule Take 40 mg by mouth at bedtime.    Marland Kitchen oxyCODONE-acetaminophen (PERCOCET) 10-325 MG per tablet Take 1 tablet by mouth every 4 (four) hours as needed for pain. 100 tablet 0  . Polyethyl Glycol-Propyl Glycol (SYSTANE ULTRA) 0.4-0.3 % SOLN Place 1 drop into both eyes daily as needed (for dry eyes).     . traZODone (DESYREL) 50 MG tablet Take 12.5 mg by mouth at bedtime. 1/4 tablet     No current facility-administered medications on file prior to visit.    Allergies:  Allergies  Allergen Reactions  . Cephalexin Itching    Per [facility] records    Family History: Family History  Problem Relation Age of Onset  . Heart disease Father   . Asthma    . Depression    . Diabetes    .  Hypertension Mother   . Migraines    . Stroke    . Thyroid disease Mother   . Hypertension Father     Social History: History   Social History  . Marital Status: Single    Spouse Name: N/A    Number of Children: N/A  . Years of Education: N/A   Occupational History  . Not on file.   Social History Main Topics  . Smoking status: Never Smoker   . Smokeless tobacco: Never Used  . Alcohol Use: No  . Drug Use: No  . Sexual Activity: Not on file   Other Topics Concern  . Not on file   Social History Narrative   Lives with 3 children in 2 story home.  Unemployed.  Trying to get disability.  Education: high school.    Review of Systems:  CONSTITUTIONAL: No fevers, chills, night sweats, or weight loss.   EYES: No visual changes or eye pain ENT: No hearing changes.  No history of nose bleeds.   RESPIRATORY: No cough, wheezing and shortness of breath.   CARDIOVASCULAR: Negative for chest pain, and palpitations.   GI: Negative for abdominal discomfort, blood in stools or black stools.  No recent change in bowel habits.   GU:  No history of incontinence.   MUSCLOSKELETAL: + history of joint pain or swelling.  No myalgias.   SKIN: Negative for lesions, rash, and itching.   HEMATOLOGY/ONCOLOGY: Negative for prolonged bleeding, bruising easily, and swollen nodes.  No history of cancer.   ENDOCRINE: Negative for cold or heat intolerance, polydipsia or goiter.   PSYCH:  +depression or anxiety symptoms.   NEURO: As Above.   Vital Signs:  BP 130/80 mmHg  Pulse 94  Ht 5\' 6"  (1.676 m)  Wt 252 lb (114.306 kg)  BMI 40.69 kg/m2  SpO2 98%   General Medical Exam:   General:  Well appearing, comfortable, wearing soft neck collar.   Eyes/ENT: see cranial nerve examination.   Neck: No masses appreciated.  Full range of motion without tenderness.  No carotid bruits. Respiratory:  Clear to auscultation, good air entry bilaterally.   Cardiac:  Regular rate and rhythm, no murmur.   GI:   Soft, non-tender, non-distended abdomen.  Bowel sounds normal. No masses, organomegaly.   Back:  No pain to palpation of spinous processes.   Extremities:  No deformities, edema, or skin discoloration. Good capillary refill.   Skin:  Skin color, texture, turgor normal. No  rashes or lesions.  Neurological Exam: MENTAL STATUS including orientation to time, place, person, recent and remote memory, attention span and concentration, language, and fund of knowledge is normal.  Speech is not dysarthric.  CRANIAL NERVES: II:  No visual field defects.  Unremarkable fundi.   III-IV-VI: Pupils equal round and reactive to light.  Normal conjugate, extra-ocular eye movements in all directions of gaze.  No nystagmus.  No ptosis.   V:  Normal facial sensation.  VII:  Normal facial symmetry and movements.  No pathologic facial reflexes.  VIII:  Normal hearing and vestibular function.   IX-X:  Normal palatal movement.   XI:  Normal shoulder shrug and head rotation.   XII:  Normal tongue strength and range of motion, no deviation or fasciculation.  MOTOR:  No atrophy, fasciculations or abnormal movements.  No pronator drift.  Tone is normal.    Right Upper Extremity:    Left Upper Extremity:    Deltoid  5/5   Deltoid  5/5   Biceps  5/5   Biceps  5/5   Triceps  5/5   Triceps  5/5   Wrist extensors  5/5   Wrist extensors  5/5   Wrist flexors  5/5   Wrist flexors  5/5   Finger extensors  5/5   Finger extensors  5/5   Finger flexors  5/5   Finger flexors  5/5   Dorsal interossei  5/5   Dorsal interossei  5/5   Abductor pollicis  5/5   Abductor pollicis  5/5   Tone (Ashworth scale)  0  Tone (Ashworth scale)  0   Right Lower Extremity:    Left Lower Extremity:    Hip flexors  5/5   Hip flexors  5/5   Hip extensors  5/5   Hip extensors  5/5   Knee flexors  5/5   Knee flexors  5/5   Knee extensors  5/5   Knee extensors  5/5   Dorsiflexors  5/5   Dorsiflexors  5/5   Plantarflexors  5/5   Plantarflexors   5/5   Toe extensors  5/5   Toe extensors  5/5   Toe flexors  5/5   Toe flexors  5/5   Tone (Ashworth scale)  0  Tone (Ashworth scale)  0   MSRs:  Right                                                                 Left brachioradialis 2+  brachioradialis 3+  biceps 2+  biceps 3+  triceps 2+  triceps 3+  patellar 3+  patellar 3+  ankle jerk 2+  ankle jerk 2+  Hoffman no  Hoffman yes  plantar response down  plantar response down  Crossed adductors.  Plantar responses are flexor.  No clonus.  SENSORY: Reduced vibration and pin prick in the hands over the palmer surface; Patient is greater at the elbows and in the lower extremities. Temperature and proprioception is intact. Romberg's sign absent.   COORDINATION/GAIT: Normal finger-to- nose-finger and heel-to-shin.  Intact rapid alternating movements bilaterally.  Gait wide based due to body habitus, but stable. She is able to perform stress gait. She is unsteady with tandem gait.   IMPRESSION: Ms. Meneely is a 33 year old  female with history of Chiari I malformation and syrinx s/p decompression craniectomy with redo most recently in August 2015 who continues to have persistent headaches, upper extremity paresthesias and pain. Given no improvement despite surgical intervention, she most likely has syringomyelia causing her persistent pain and paresthesias.  I explained that central pain can be challenging to treat.  She is already taking a number of medications for pain including gabapentin, nortriptyline, cymbalta, oxycodone, and valium which provides minimal benefit.  I strongly recommended that she establish care with pain management for her chronic pain.  Regarding her hand paresthesias, although she may indeed have CTS, sensory complaints may also be related to her syrinx in which case repeat CTS release may not be helpful.  I have recommended that she have EMG of the right upper extremity to better clarify the nature of her  symptoms.  Unfortunately, I do not feel that there is much that I can offer in terms of symptomatic management, since what she really needs is pain management to restructure her pain medications, which she agrees with.  I reassured her that exam and history does not suggest that she has a superimposed neuromuscular condition that is contributing to her current presentation.  Patient was offered an opportunity to ask questions which I answered to the best of my ability.  With respect to her Chiari malformation syrinx, I'm not sure whether CSF shunting would be an option, if symptoms worsen. A second opinion at academic center can be considered, but I will defer this to her neurosurgeon.  The duration of this appointment visit was 50 minutes of face-to-face time with the patient.  Greater than 50% of this time was spent in counseling, explanation of diagnosis, planning of further management, and coordination of care.   Thank you for allowing me to participate in patient's care.  If I can answer any additional questions, I would be pleased to do so.    Sincerely,    Morgan Costilow K. Posey Pronto, DO

## 2014-09-16 NOTE — Progress Notes (Signed)
Note faxed.

## 2014-10-19 ENCOUNTER — Ambulatory Visit (INDEPENDENT_AMBULATORY_CARE_PROVIDER_SITE_OTHER): Payer: Medicaid Other | Admitting: Neurology

## 2014-10-19 ENCOUNTER — Telehealth: Payer: Self-pay | Admitting: Neurology

## 2014-10-19 DIAGNOSIS — R209 Unspecified disturbances of skin sensation: Secondary | ICD-10-CM

## 2014-10-19 DIAGNOSIS — G935 Compression of brain: Secondary | ICD-10-CM | POA: Diagnosis not present

## 2014-10-19 DIAGNOSIS — G950 Syringomyelia and syringobulbia: Secondary | ICD-10-CM

## 2014-10-19 DIAGNOSIS — G5602 Carpal tunnel syndrome, left upper limb: Secondary | ICD-10-CM

## 2014-10-19 NOTE — Telephone Encounter (Signed)
Pt would like a copy of the EMG sent to the Primary Care Dr Leta Baptist and she would like a copy of the test results sent to her.

## 2014-10-19 NOTE — Procedures (Signed)
[facility]  [address], [place]  [place], [place] [address] Tel: [phone] Fax:  [phone] Test Date:  10/19/2014  Patient: Morgan Kent DOB: 1981/01/16 Physician: Narda Amber  Sex: Female Height: 5\' 6"  Ref Phys: Narda Amber  ID#: [id number] Temp: 34.0C Technician: Laureen Ochs R. NCS T.   Patient Complaints: Patient is a 33 year old female here for evaluation of numbness, tingling and pain in both hands and arms left is worse than right. She is status post CTS surgery on left 2014.  NCV & EMG Findings: Extensive electrodiagnostic testing of the right upper extremity and additional studies of the left shows:  1. Bilateral median, ulnar, and radial sensory responses are within normal limits. Left palmar study is abnormal. Right palmar study is within normal limits. 2. Bilateral median and ulnar motor responses are within normal limits. 3. There is no evidence of active or chronic motor axon loss changes affecting any of the tested muscles. Motor unit configuration and recruitment pattern is within normal limits.  Impression: 1. There is evidence of the residuals of an old left median neuropathy at or distal to the wrist, consistent with the clinical diagnosis of carpal tunnel syndrome.   2. There is no evidence of carpal tunnel syndrome affecting the right upper extremity, diffuse myopathy, or cervical radiculopathy affecting the upper extremities.   ___________________________ Narda Amber    Nerve Conduction Studies Anti Sensory Summary Table   Stim Site NR Peak (ms) Norm Peak (ms) P-T Amp (V) Norm P-T Amp  Left Median Anti Sensory (2nd Digit)  34C  Wrist    3.2 <3.4 35.1 >20  Right Median Anti Sensory (2nd Digit)  34C  Wrist    3.0 <3.4 36.9 >20  Left Radial Anti Sensory (Base 1st Digit)  34C  Wrist    2.1 <2.7 41.7 >18  Right Radial Anti Sensory (Base 1st Digit)  34C  Wrist    1.9 <2.7 44.4 >18  Left Ulnar Anti Sensory (5th Digit)  34C  Wrist     2.5 <3.1 25.2 >12  Right Ulnar Anti Sensory (5th Digit)  34C  Wrist    2.5 <3.1 23.4 >12   Motor Summary Table   Stim Site NR Onset (ms) Norm Onset (ms) O-P Amp (mV) Norm O-P Amp Site1 Site2 Delta-0 (ms) Dist (cm) Vel (m/s) Norm Vel (m/s)  Left Median Motor (Abd Poll Brev)  34C  Wrist    3.5 <3.9 14.1 >6 Elbow Wrist 4.9 26.0 53 >50  Elbow    8.4  12.9         Right Median Motor (Abd Poll Brev)  34C  Wrist    3.0 <3.9 14.3 >6 Elbow Wrist 4.9 28.0 57 >50  Elbow    7.9  12.6         Left Ulnar Motor (Abd Dig Minimi)  34C  Wrist    2.3 <3.1 9.7 >7 B Elbow Wrist 4.3 24.0 56 >50  B Elbow    6.6  8.6  A Elbow B Elbow 1.8 10.0 56 >50  A Elbow    8.4  8.6         Right Ulnar Motor (Abd Dig Minimi)  34C  Wrist    2.4 <3.1 9.3 >7 B Elbow Wrist 3.8 24.0 63 >50  B Elbow    6.2  8.3  A Elbow B Elbow 2.0 10.0 50 >50  A Elbow    8.2  8.1  Comparison Summary Table   Stim Site NR Peak (ms) Norm Peak (ms) P-T Amp (V) Site1 Site2 Delta-P (ms) Norm Delta (ms)  Left Median/Ulnar Palm Comparison (Wrist - 8cm)  34C  Median Palm    2.2 <2.2 39.8 Median Palm Ulnar Palm 0.5   Ulnar Palm    1.7 <2.2 12.4      Right Median/Ulnar Palm Comparison (Wrist - 8cm)  34C  Median Palm    1.9 <2.2 119.3 Median Palm Ulnar Palm 0.3   Ulnar Palm    1.6 <2.2 14.5       F Wave Studies   NR F-Lat (ms) Lat Norm (ms) L-R F-Lat (ms)  Left Ulnar (Mrkrs) (Abd Dig Min)  34C     28.66 <33 0.00  Right Ulnar (Mrkrs) (Abd Dig Min)  34C     28.66 <33 0.00   EMG   Side Muscle Ins Act Fibs Psw Fasc Number Recrt Dur Dur. Amp Amp. Poly Poly. Comment  Right 1stDorInt Nml Nml Nml Nml Nml Nml Nml Nml Nml Nml Nml Nml N/A  Right Ext Indicis Nml Nml Nml Nml Nml Nml Nml Nml Nml Nml Nml Nml N/A  Right PronatorTeres Nml Nml Nml Nml Nml Nml Nml Nml Nml Nml Nml Nml N/A  Right Biceps Nml Nml Nml Nml Nml Nml Nml Nml Nml Nml Nml Nml N/A  Right Triceps Nml Nml Nml Nml Nml Nml Nml Nml Nml Nml Nml Nml N/A  Right Deltoid Nml  Nml Nml Nml Nml Nml Nml Nml Nml Nml Nml Nml N/A  Left 1stDorInt Nml Nml Nml Nml Nml Nml Nml Nml Nml Nml Nml Nml N/A  Left Abd Poll Brev Nml Nml Nml Nml Nml Nml Nml Nml Nml Nml Nml Nml N/A  Left FlexPolLong Nml Nml Nml Nml Nml Nml Nml Nml Nml Nml Nml Nml N/A  Left PronatorTeres Nml Nml Nml Nml Nml Nml Nml Nml Nml Nml Nml Nml N/A  Left Biceps Nml Nml Nml Nml Nml Nml Nml Nml Nml Nml Nml Nml N/A  Left Triceps Nml Nml Nml Nml Nml Nml Nml Nml Nml Nml Nml Nml N/A  Left Deltoid Nml Nml Nml Nml Nml Nml Nml Nml Nml Nml Nml Nml N/A      Waveforms:

## 2014-10-20 NOTE — Telephone Encounter (Signed)
Will do!

## 2014-10-20 NOTE — Telephone Encounter (Signed)
Done

## 2014-12-19 ENCOUNTER — Ambulatory Visit: Payer: Self-pay | Admitting: Neurosurgery

## 2015-01-30 ENCOUNTER — Encounter
Admit: 2015-01-30 | Disposition: A | Discharge status: Home / Self Care | Payer: Self-pay | Attending: Anesthesiology | Admitting: Anesthesiology

## 2015-02-10 ENCOUNTER — Encounter
Admit: 2015-02-10 | Disposition: A | Discharge status: Home / Self Care | Payer: Self-pay | Attending: Anesthesiology | Admitting: Anesthesiology

## 2015-02-22 ENCOUNTER — Ambulatory Visit
Admit: 2015-02-22 | Disposition: A | Discharge status: Home / Self Care | Payer: Self-pay | Attending: Neurosurgery | Admitting: Neurosurgery

## 2015-03-03 NOTE — Op Note (Signed)
PATIENT NAME:  Morgan Kent, Morgan Kent MR#:  [id number] DATE OF BIRTH:  1981/07/02  DATE OF PROCEDURE:  06/15/2013  PREOPERATIVE DIAGNOSIS: Left carpal tunnel syndrome.   POSTOPERATIVE DIAGNOSIS: Left carpal tunnel syndrome.  PROCEDURE: Left carpal tunnel release.   ANESTHESIA: General.   SURGEON: Laurene Footman, MD  DESCRIPTION OF PROCEDURE: The patient was brought to the operating room, and after adequate anesthesia was obtained, the left arm was prepped and draped in the usual sterile fashion and tourniquet applied to the upper arm. After patient identification and timeout procedures were completed, the tourniquet was raised to 250 mmHg. An approximately 2.5 cm incision was made in line with the ring metacarpal starting just distal to the distal wrist flexion crease. Skin and subcutaneous tissue were divided, with hemostasis being achieved with electrocautery. The transcarpal ligament was identified. There was aberrant muscle over the distal portion of the carpal tunnel, approximately 3 to 4 mm in width, appearing to be coming more from the hypothenar side rather than the thenar eminence. This was elevated off the transcarpal ligament. The transcarpal ligament was opened in its midportion and a hemostat placed underneath to protect the underlying structure. Release was carried out proximally and then distally, and there did appear to be some compression at the level of the aberrant muscle consistent with this being the etiology of the carpal tunnel. There was good vascular blush to the nerve, and it appeared there was adequate decompression. Exploration of the carpal tunnel revealed no masses. There was some mild flexor tenosynovitis. The wound was irrigated and then closed with simple interrupted 4-0 nylon skin sutures. Then 10 mL of 0.5% Sensorcaine without epinephrine was infiltrated into the area of the incision to aid in postoperative analgesia. Sterile dressing of Xeroform, 4 x 4's, Webril and Ace wrap  were applied, and the patient was sent to the recovery room in stable condition.   TOURNIQUET TIME: 11 minutes at 250 mmHg.    ____________________________ Laurene Footman, MD [email or web address] D: 06/15/2013 11:11:54 ET T: 06/15/2013 11:50:09 ET JOB#: [id number]  cc: Laurene Footman, MD, <Dictator> Laurene Footman MD ELECTRONICALLY SIGNED 06/15/2013 16:42

## 2015-04-20 ENCOUNTER — Encounter: Payer: Self-pay | Admitting: Cardiovascular Disease

## 2015-05-08 ENCOUNTER — Ambulatory Visit
Admission: RE | Admit: 2015-05-08 | Discharge: 2015-05-08 | Disposition: A | Discharge status: Home / Self Care | Payer: Medicaid Other | Source: Ambulatory Visit | Attending: Internal Medicine | Admitting: Internal Medicine

## 2015-05-08 ENCOUNTER — Other Ambulatory Visit: Payer: Self-pay | Admitting: Internal Medicine

## 2015-05-08 DIAGNOSIS — R0609 Other forms of dyspnea: Secondary | ICD-10-CM | POA: Diagnosis not present

## 2015-05-08 DIAGNOSIS — I10 Essential (primary) hypertension: Secondary | ICD-10-CM | POA: Insufficient documentation

## 2015-05-08 DIAGNOSIS — R0602 Shortness of breath: Secondary | ICD-10-CM

## 2015-06-01 ENCOUNTER — Ambulatory Visit: Payer: Medicaid Other | Admitting: Cardiovascular Disease

## 2015-06-27 ENCOUNTER — Encounter (INDEPENDENT_AMBULATORY_CARE_PROVIDER_SITE_OTHER): Payer: Self-pay

## 2015-06-27 ENCOUNTER — Encounter: Payer: Self-pay | Admitting: Cardiovascular Disease

## 2015-06-27 ENCOUNTER — Ambulatory Visit (INDEPENDENT_AMBULATORY_CARE_PROVIDER_SITE_OTHER): Payer: Medicaid Other | Admitting: Cardiovascular Disease

## 2015-06-27 VITALS — BP 118/72 | HR 95 | Ht 66.0 in | Wt 264.8 lb

## 2015-06-27 DIAGNOSIS — R079 Chest pain, unspecified: Secondary | ICD-10-CM | POA: Diagnosis not present

## 2015-06-27 MED ORDER — CARVEDILOL 12.5 MG PO TABS: 12.5000 mg | ORAL_TABLET | Freq: Two times a day (BID) | ORAL | Status: DC

## 2015-06-27 NOTE — Progress Notes (Signed)
Cardiology Office Note   Date:  06/27/2015   ID:  Morgan, Kent 1981/02/27, MRN [id number]  PCP:  Christie Nottingham., PA  Cardiologist:   Thayer Headings, MD   Chief Complaint  Patient presents with  . other    Ref. by Dr. Leta Baptist for abnormal Echo. Meds reviewed by the patient's med list. Pt. c/o chest pain and shortness of breath at times.    Problem list  1. Morbid obesity 2. Mild chronic systolic congestive heart failure 3. Obstructive sleep apnea   History of Present Illness: Morgan Kent is a 34 y.o. female who presents for further evaluation of her shortness of breath . She had an echo at her medical doctor's office ,   Read by Dr. Rayford Halsted - EF of 45%.  Tries to eat a low salt diet.   Has OSA and sleeps with home O2 but not CPAP.  Walks around the house.  Gets short of breath with grocery shopping .  Does not work. Does not get any exercise.    Past Medical History  Diagnosis Date  . PONV (postoperative nausea and vomiting)   . Hypertension   . Depression   . Anxiety   . History of kidney stones   . Headache(784.0)   . Dizziness   . Arthritis     rheumo possible  . Lupus   . Chiari malformation     Past Surgical History  Procedure Laterality Date  . Craniotomy  14,06    chiari  . Carpel tunnel  Left 2014  . Dental surgery    . Hernia repair      umbilical  . Suboccipital craniectomy cervical laminectomy N/A 09/29/2013    Procedure: SUBOCCIPITAL CRANIECTOMY CERVICAL LAMINECTOMY/DURAPLASTY;  Surgeon: Ophelia Charter, MD;  Location: [facility];  Service: Neurosurgery;  Laterality: N/A;  posterior  . Suboccipital craniectomy cervical laminectomy N/A 06/23/2014    Procedure: SUBOCCIPITAL CRANIECTOMY CERVICAL LAMINECTOMY/DURAPLASTY;  Surgeon: Newman Pies, MD;  Location: [facility];  Service: Neurosurgery;  Laterality: N/A;  suboccipital craniectomy with cervical laminectomy and duraplasty  . Placement of lumbar drain N/A 07/07/2014   Procedure: PLACEMENT OF LUMBAR DRAIN AND CLOSURE OF CERVICAL INCISION;  Surgeon: Newman Pies, MD;  Location: [facility];  Service: Neurosurgery;  Laterality: N/A;     Current Outpatient Prescriptions  Medication Sig Dispense Refill  . adapalene (DIFFERIN) 0.1 % gel Apply 1 application topically at bedtime. Apply to forehead and chin    . antiseptic oral rinse (BIOTENE) LIQD 15 mLs by Mouth Rinse route 2 (two) times daily.     Marland Kitchen atenolol (TENORMIN) 25 MG tablet Take 25 mg by mouth every morning.     . beclomethasone (QVAR) 80 MCG/ACT inhaler Inhale 1 puff into the lungs 2 (two) times daily.    . cetirizine (ZYRTEC) 10 MG tablet Take 10 mg by mouth daily.    . clobetasol (OLUX) 0.05 % topical foam Apply 1 application topically at bedtime as needed (for scalp). Apply all over body    . clobetasol (TEMOVATE) 0.05 % external solution Apply 1 application topically 2 (two) times daily. Apply to scalp    . desonide (DESOWEN) 0.05 % cream Apply 1 application topically daily as needed (for lupus). Apply all over body    . diazepam (VALIUM) 5 MG tablet Take 1 tablet (5 mg total) by mouth every 6 (six) hours as needed for muscle spasms. 100 tablet 0  . diclofenac sodium (VOLTAREN) 1 %  GEL Apply 1 application topically daily as needed (for pain).     Marland Kitchen docusate sodium 100 MG CAPS Take 100 mg by mouth 2 (two) times daily. 60 capsule 0  . fluticasone (FLONASE) 50 MCG/ACT nasal spray Place 1 spray into both nostrils at bedtime.     . gabapentin (NEURONTIN) 300 MG capsule Take 300 mg by mouth 3 (three) times daily.    . hydrochlorothiazide (HYDRODIURIL) 25 MG tablet Take 25 mg by mouth daily.    . hydroxychloroquine (PLAQUENIL) 200 MG tablet Take 200 mg by mouth daily.     . meloxicam (MOBIC) 7.5 MG tablet Take 7.5 mg by mouth 2 (two) times daily as needed (joint pain).    . montelukast (SINGULAIR) 10 MG tablet Take 10 mg by mouth at bedtime.    . Multiple Vitamin (MULTIVITAMIN WITH MINERALS) TABS tablet  Take 1 tablet by mouth daily.    . norethindrone (SHAROBEL) 0.35 MG tablet Take 1 tablet by mouth daily.    . nortriptyline (PAMELOR) 10 MG capsule Take 40 mg by mouth at bedtime.    Marland Kitchen oxyCODONE-acetaminophen (PERCOCET/ROXICET) 5-325 MG per tablet Take 1 tablet by mouth every 4 (four) hours as needed.     Vladimir Faster Glycol-Propyl Glycol (SYSTANE ULTRA) 0.4-0.3 % SOLN Place 1 drop into both eyes daily as needed (for dry eyes).     . traZODone (DESYREL) 50 MG tablet Take 12.5 mg by mouth at bedtime. 1/4 tablet    . Vortioxetine HBr (BRINTELLIX) 10 MG TABS Take 10 mg by mouth daily.     No current facility-administered medications for this visit.    Allergies:   Cephalexin    Social History:  The patient  reports that she has never smoked. She has never used smokeless tobacco. She reports that she does not drink alcohol or use illicit drugs.   Family History:  The patient's family history includes Asthma in an other family member; Depression in an other family member; Diabetes in an other family member; Heart disease in her father; Hypertension in her father and mother; Migraines in an other family member; Stroke in an other family member; Thyroid disease in her mother.    ROS:  Please see the history of present illness.    Review of Systems: Constitutional:  denies fever, chills, diaphoresis, appetite change and fatigue.  HEENT: denies photophobia, eye pain, redness, hearing loss, ear pain, congestion, sore throat, rhinorrhea, sneezing, neck pain, neck stiffness and tinnitus.  Respiratory: admits to SOB, DOE,    Cardiovascular: denies chest pain, palpitations and leg swelling.  Gastrointestinal: denies nausea, vomiting, abdominal pain, diarrhea, constipation, blood in stool.  Genitourinary: denies dysuria, urgency, frequency, hematuria, flank pain and difficulty urinating.  Musculoskeletal: denies  myalgias, back pain, joint swelling, arthralgias and gait problem.   Skin: denies pallor,  rash and wound.  Neurological: denies dizziness, seizures, syncope, weakness, light-headedness, numbness and headaches.   Hematological: denies adenopathy, easy bruising, personal or family bleeding history.  Psychiatric/ Behavioral: denies suicidal ideation, mood changes, confusion, nervousness, sleep disturbance and agitation.       All other systems are reviewed and negative.    PHYSICAL EXAM: VS:  BP 118/72 mmHg  Pulse 95  Ht 5\' 6"  (1.676 m)  Wt 120.09 kg (264 lb 12 oz)  BMI 42.75 kg/m2 , BMI Body mass index is 42.75 kg/(m^2). GEN: Well nourished, well developed, in no acute distress HEENT: normal Neck: no JVD, carotid bruits, or masses Cardiac: RRR; no murmurs, rubs, or gallops,no  edema  Respiratory:  clear to auscultation bilaterally, normal work of breathing GI: soft, nontender, nondistended, + BS MS: no deformity or atrophy Skin: warm and dry, no rash Neuro:  Strength and sensation are intact Psych: normal   EKG:  EKG is ordered today. The ekg ordered today demonstrates NSR at 95. No ST or T wave changes.    Recent Labs: No results found for requested labs within last 365 days.    Lipid Panel No results found for: CHOL, TRIG, HDL, CHOLHDL, VLDL, LDLCALC, LDLDIRECT    Wt Readings from Last 3 Encounters:  06/27/15 120.09 kg (264 lb 12 oz)  09/15/14 114.306 kg (252 lb)  07/12/14 109.7 kg (241 lb 13.5 oz)      Other studies Reviewed: Additional studies/ records that were reviewed today include: . Review of the above records demonstrates:    ASSESSMENT AND PLAN:  1.  Chronic systolic CHF;  She has very mild  Chronic systolic congestive heart failure by outside echo. Her ejection fraction is 45%. The echo was otherwise unremarkable.   I suspect that she has some very mild heart failure due to her obesity. We will stop the atenolol and I will start her on carvedilol 12.5 mg twice a day. We will continue the hydrochlorothiazide for now.   I strongly  encouraged her to exercise on a regular basis. I think that this is her major limitation. At this point I do not think that she needs any additional workup .   She denies any chest pain. I'll see her back in the office in several months for follow-up visit.  We will get a repeat echo in several months . Ive advised her to avoid salt   2. Obstructive sleep apnea:  Followed by her medical doctor.   Current medicines are reviewed at length with the patient today.  The patient does not have concerns regarding medicines.  The following changes have been made:  no change  Labs/ tests ordered today include:  Orders Placed This Encounter  Procedures  . EKG 12-Lead     Disposition:   FU with me in 2-3 months.      Riaan Toledo, Wonda Cheng, MD  06/27/2015 11:24 AM    [place], [place], [place]  [address] Phone: [phone]; Fax: [phone]   [facility]  [address] [place], [place]  [address] [phone]   Fax [phone]

## 2015-06-27 NOTE — Patient Instructions (Signed)
Medication Instructions:  Your physician has recommended you make the following change in your medication:  STOP taking atenolol START taking coreg 12.5mg  twice a day   Labwork: none  Testing/Procedures: none  Follow-Up: Your physician recommends that you schedule a follow-up appointment in: 2-3 months with Dr. Acie Fredrickson   Any Other Special Instructions Will Be Listed Below (If Applicable).  Your physician has recommended you begin exercising.

## 2015-08-21 ENCOUNTER — Ambulatory Visit (INDEPENDENT_AMBULATORY_CARE_PROVIDER_SITE_OTHER): Payer: Medicaid Other | Admitting: Cardiovascular Disease

## 2015-08-21 ENCOUNTER — Encounter: Payer: Self-pay | Admitting: Cardiovascular Disease

## 2015-08-21 VITALS — BP 110/70 | HR 111 | Ht 66.0 in | Wt 265.5 lb

## 2015-08-21 DIAGNOSIS — I5022 Chronic systolic (congestive) heart failure: Secondary | ICD-10-CM | POA: Diagnosis not present

## 2015-08-21 DIAGNOSIS — R000 Tachycardia, unspecified: Secondary | ICD-10-CM

## 2015-08-21 MED ORDER — CARVEDILOL 25 MG PO TABS: 25.0000 mg | ORAL_TABLET | Freq: Two times a day (BID) | ORAL | Status: DC

## 2015-08-21 NOTE — Patient Instructions (Addendum)
Please ask your doctor who prescribed the Pamelor  ( Nortriplyline ) to see if there is another medication that you could take that would not increase your HR .   Medication Instructions:  Your physician has recommended you make the following change in your medication:  INCREASE coreg to 25mg  twice per day   Labwork: none  Testing/Procedures: none  Follow-Up: Your physician recommends that you schedule a follow-up appointment in: three months with Dr. Acie Fredrickson   Any Other Special Instructions Will Be Listed Below (If Applicable).

## 2015-08-21 NOTE — Progress Notes (Signed)
Cardiology Office Note   Date:  08/21/2015   ID:  Zola, Runion 21-Jan-1981, MRN [id number]  PCP:  Christie Nottingham., PA  Cardiologist:   Thayer Headings, MD   Chief Complaint  Patient presents with  . other    2-3 month follow up. Meds reviewed by the patient verbally. "doing well."    Problem list  1. Morbid obesity 2. Mild chronic systolic congestive heart failure 3. Obstructive sleep apnea   History of Present Illness: Morgan Kent is a 34 y.o. female who presents for further evaluation of her shortness of breath . She had an echo at her medical doctor's office ,   Read by Dr. Rayford Halsted - EF of 45%.  Tries to eat a low salt diet.   Has OSA and sleeps with home O2 but not CPAP.  Walks around the house.  Gets short of breath with grocery shopping .  Does not work. Does not get any exercise.  Oct. 10, 2016:  Morgan Kent is seen back today for follow up of her CHF Brought the echo CD - I cannot find a computer that will run it without installing program  Breathing is ok  Trying to walk some .  No cardiac symptoms.  Has pain in neck and lower back     Past Medical History  Diagnosis Date  . PONV (postoperative nausea and vomiting)   . Hypertension   . Depression   . Anxiety   . History of kidney stones   . Headache(784.0)   . Dizziness   . Arthritis     rheumo possible  . Lupus ([place])   . Chiari malformation     Past Surgical History  Procedure Laterality Date  . Craniotomy  14,06    chiari  . Carpel tunnel  Left 2014  . Dental surgery    . Hernia repair      umbilical  . Suboccipital craniectomy cervical laminectomy N/A 09/29/2013    Procedure: SUBOCCIPITAL CRANIECTOMY CERVICAL LAMINECTOMY/DURAPLASTY;  Surgeon: Ophelia Charter, MD;  Location: [facility];  Service: Neurosurgery;  Laterality: N/A;  posterior  . Suboccipital craniectomy cervical laminectomy N/A 06/23/2014    Procedure: SUBOCCIPITAL CRANIECTOMY CERVICAL LAMINECTOMY/DURAPLASTY;   Surgeon: Newman Pies, MD;  Location: [facility];  Service: Neurosurgery;  Laterality: N/A;  suboccipital craniectomy with cervical laminectomy and duraplasty  . Placement of lumbar drain N/A 07/07/2014    Procedure: PLACEMENT OF LUMBAR DRAIN AND CLOSURE OF CERVICAL INCISION;  Surgeon: Newman Pies, MD;  Location: [facility];  Service: Neurosurgery;  Laterality: N/A;     Current Outpatient Prescriptions  Medication Sig Dispense Refill  . adapalene (DIFFERIN) 0.1 % gel Apply 1 application topically at bedtime. Apply to forehead and chin    . antiseptic oral rinse (BIOTENE) LIQD 15 mLs by Mouth Rinse route 2 (two) times daily.     . beclomethasone (QVAR) 80 MCG/ACT inhaler Inhale 1 puff into the lungs 2 (two) times daily.    . carvedilol (COREG) 12.5 MG tablet Take 1 tablet (12.5 mg total) by mouth 2 (two) times daily. 60 tablet 3  . cetirizine (ZYRTEC) 10 MG tablet Take 10 mg by mouth daily.    . clobetasol (OLUX) 0.05 % topical foam Apply 1 application topically at bedtime as needed (for scalp). Apply all over body    . clobetasol (TEMOVATE) 0.05 % external solution Apply 1 application topically 2 (two) times daily. Apply to scalp    . desonide (DESOWEN)  0.05 % cream Apply 1 application topically daily as needed (for lupus). Apply all over body    . diazepam (VALIUM) 5 MG tablet Take 1 tablet (5 mg total) by mouth every 6 (six) hours as needed for muscle spasms. 100 tablet 0  . diclofenac sodium (VOLTAREN) 1 % GEL Apply 1 application topically daily as needed (for pain).     Marland Kitchen docusate sodium 100 MG CAPS Take 100 mg by mouth 2 (two) times daily. 60 capsule 0  . fluticasone (FLONASE) 50 MCG/ACT nasal spray Place 1 spray into both nostrils at bedtime.     . gabapentin (NEURONTIN) 300 MG capsule Take 300 mg by mouth 3 (three) times daily.    . hydrochlorothiazide (HYDRODIURIL) 25 MG tablet Take 25 mg by mouth daily.    . hydroxychloroquine (PLAQUENIL) 200 MG tablet Take 200 mg by mouth  daily.     . meloxicam (MOBIC) 7.5 MG tablet Take 7.5 mg by mouth 2 (two) times daily as needed (joint pain).    . montelukast (SINGULAIR) 10 MG tablet Take 10 mg by mouth at bedtime.    . Multiple Vitamin (MULTIVITAMIN WITH MINERALS) TABS tablet Take 1 tablet by mouth daily.    . norethindrone (SHAROBEL) 0.35 MG tablet Take 1 tablet by mouth daily.    . nortriptyline (PAMELOR) 10 MG capsule Take 40 mg by mouth at bedtime.    Marland Kitchen oxyCODONE-acetaminophen (PERCOCET/ROXICET) 5-325 MG per tablet Take 1 tablet by mouth every 4 (four) hours as needed.     Vladimir Faster Glycol-Propyl Glycol (SYSTANE ULTRA) 0.4-0.3 % SOLN Place 1 drop into both eyes daily as needed (for dry eyes).     . traZODone (DESYREL) 50 MG tablet Take 12.5 mg by mouth at bedtime. 1/4 tablet    . Vortioxetine HBr (BRINTELLIX) 10 MG TABS Take 10 mg by mouth daily.     No current facility-administered medications for this visit.    Allergies:   Cephalexin    Social History:  The patient  reports that she has never smoked. She has never used smokeless tobacco. She reports that she does not drink alcohol or use illicit drugs.   Family History:  The patient's family history includes Asthma in an other family member; Depression in an other family member; Diabetes in an other family member; Heart disease in her father; Hypertension in her father and mother; Migraines in an other family member; Stroke in an other family member; Thyroid disease in her mother.    ROS:  Please see the history of present illness.    Review of Systems: Constitutional:  denies fever, chills, diaphoresis, appetite change and fatigue.  HEENT: denies photophobia, eye pain, redness, hearing loss, ear pain, congestion, sore throat, rhinorrhea, sneezing, neck pain, neck stiffness and tinnitus.  Respiratory: admits to SOB, DOE,    Cardiovascular: denies chest pain, palpitations and leg swelling.  Gastrointestinal: denies nausea, vomiting, abdominal pain, diarrhea,  constipation, blood in stool.  Genitourinary: denies dysuria, urgency, frequency, hematuria, flank pain and difficulty urinating.  Musculoskeletal: denies  myalgias, back pain, joint swelling, arthralgias and gait problem.   Skin: denies pallor, rash and wound.  Neurological: denies dizziness, seizures, syncope, weakness, light-headedness, numbness and headaches.   Hematological: denies adenopathy, easy bruising, personal or family bleeding history.  Psychiatric/ Behavioral: denies suicidal ideation, mood changes, confusion, nervousness, sleep disturbance and agitation.       All other systems are reviewed and negative.    PHYSICAL EXAM: VS:  BP 110/70 mmHg  Pulse  111  Ht 5\' 6"  (1.676 m)  Wt 120.43 kg (265 lb 8 oz)  BMI 42.87 kg/m2 , BMI Body mass index is 42.87 kg/(m^2). GEN: Well nourished, well developed, in no acute distress HEENT: normal Neck: no JVD, carotid bruits, or masses Cardiac: RRR; no murmurs, rubs, or gallops,no edema  Respiratory:  clear to auscultation bilaterally, normal work of breathing GI: soft, nontender, nondistended, + BS MS: no deformity or atrophy Skin: warm and dry, no rash Neuro:  Strength and sensation are intact Psych: normal   EKG:  EKG is ordered today. The ekg ordered today demonstrates  sinus tachycardia at 112 beats a minute. She has Nonspecific ST changes    Recent Labs: No results found for requested labs within last 365 days.    Lipid Panel No results found for: CHOL, TRIG, HDL, CHOLHDL, VLDL, LDLCALC, LDLDIRECT    Wt Readings from Last 3 Encounters:  08/21/15 120.43 kg (265 lb 8 oz)  06/27/15 120.09 kg (264 lb 12 oz)  09/15/14 114.306 kg (252 lb)      Other studies Reviewed: Additional studies/ records that were reviewed today include: . Review of the above records demonstrates:    ASSESSMENT AND PLAN:  1.  Chronic systolic CHF;  She has very mild  Chronic systolic congestive heart failure by outside echo. Her ejection  fraction is 45%. The echo was otherwise unremarkable.  I was not able to look at the echo today she brought of CAD but was not able to load on any of our computers.  She remains tachycardic. I suspect that this is the cause of her mildly depressed left ventricle systolic function. We will increase the carvedilol to 25 mg twice a day. I have requested that she talk to her medical doctor and see if there is an alternative to nortriptyline ( which  can cause tachycardia) . I'll see her in 3 months for follow-up visit.  2. Obstructive sleep apnea:  Followed by her medical doctor.   Current medicines are reviewed at length with the patient today.  The patient does not have concerns regarding medicines.  The following changes have been made:  no change  Labs/ tests ordered today include:   Orders Placed This Encounter  Procedures  . EKG 12-Lead     Disposition:   FU with me in 3 months.      Jaecob Lowden, Wonda Cheng, MD  08/21/2015 11:46 AM    [facility] [place], [place], [place]  [address] Phone: [phone]; Fax: [phone]   [facility]  [address] [place], [place]  [address] [phone]   Fax [phone]

## 2015-09-20 ENCOUNTER — Encounter: Payer: Medicaid Other | Admitting: Neurology

## 2015-11-22 ENCOUNTER — Ambulatory Visit (INDEPENDENT_AMBULATORY_CARE_PROVIDER_SITE_OTHER): Payer: Medicaid Other | Admitting: Cardiovascular Disease

## 2015-11-22 ENCOUNTER — Encounter: Payer: Self-pay | Admitting: Cardiovascular Disease

## 2015-11-22 VITALS — BP 100/64 | HR 101 | Ht 66.0 in | Wt 263.2 lb

## 2015-11-22 DIAGNOSIS — I429 Cardiomyopathy, unspecified: Secondary | ICD-10-CM | POA: Diagnosis not present

## 2015-11-22 DIAGNOSIS — R000 Tachycardia, unspecified: Secondary | ICD-10-CM | POA: Diagnosis not present

## 2015-11-22 DIAGNOSIS — Z9989 Dependence on other enabling machines and devices: Secondary | ICD-10-CM

## 2015-11-22 DIAGNOSIS — G4733 Obstructive sleep apnea (adult) (pediatric): Secondary | ICD-10-CM

## 2015-11-22 DIAGNOSIS — IMO0002 Reserved for concepts with insufficient information to code with codable children: Secondary | ICD-10-CM

## 2015-11-22 DIAGNOSIS — I420 Dilated cardiomyopathy: Secondary | ICD-10-CM

## 2015-11-22 DIAGNOSIS — M329 Systemic lupus erythematosus, unspecified: Secondary | ICD-10-CM | POA: Diagnosis not present

## 2015-11-22 DIAGNOSIS — I5022 Chronic systolic (congestive) heart failure: Secondary | ICD-10-CM

## 2015-11-22 DIAGNOSIS — E668 Other obesity: Secondary | ICD-10-CM | POA: Insufficient documentation

## 2015-11-22 DIAGNOSIS — I10 Essential (primary) hypertension: Secondary | ICD-10-CM

## 2015-11-22 MED ORDER — HYDROCHLOROTHIAZIDE 25 MG PO TABS: 25.0000 mg | ORAL_TABLET | Freq: Every day | ORAL | Status: DC

## 2015-11-22 MED ORDER — CARVEDILOL 25 MG PO TABS: 25.0000 mg | ORAL_TABLET | Freq: Two times a day (BID) | ORAL | Status: DC

## 2015-11-22 NOTE — Assessment & Plan Note (Signed)
Ejection fraction 45% on echocardiogram June 2016, images not available Done at outside facility Recommended repeat echocardiogram to confirm ejection fraction, will do through our system so we are able to see images. We'll continue carvedilol, HCTZ for now  Unable to add ACE inhibitor given low blood pressure l

## 2015-11-22 NOTE — Assessment & Plan Note (Signed)
We have encouraged continued exercise, careful diet management in an effort to lose weight. 

## 2015-11-22 NOTE — Assessment & Plan Note (Signed)
She appears relatively euvolemic on today's visit Long discussion concerning CHF type symptoms, moderate in her fluid and salt intake We'll repeat echocardiogram to evaluate ejection fraction on medications and on CPAP

## 2015-11-22 NOTE — Assessment & Plan Note (Signed)
History of lupus, managed by rheumatology In the past has required prednisone She reports symptoms are currently stable

## 2015-11-22 NOTE — Patient Instructions (Addendum)
You are doing well. No medication changes were made.  We will order an echocardiogram for cardiomyopathy Echocardiography is a painless test that uses sound waves to create images of your heart. It provides your doctor with information about the size and shape of your heart and how well your heart's chambers and valves are working. This procedure takes approximately one hour. There are no restrictions for this procedure.  Date & time: ________________________________________  Please call us if you have new issues that need to be addressed before your next appt.  Your physician wants you to follow-up in: 6 months.  You will receive a reminder letter in the mail two months in advance. If you don't receive a letter, please call our office to schedule the follow-up appointment.  Echocardiogram An echocardiogram, or echocardiography, uses sound waves (ultrasound) to produce an image of your heart. The echocardiogram is simple, painless, obtained within a short period of time, and offers valuable information to your health care provider. The images from an echocardiogram can provide information such as:  Evidence of coronary artery disease (CAD).  Heart size.  Heart muscle function.  Heart valve function.  Aneurysm detection.  Evidence of a past heart attack.  Fluid buildup around the heart.  Heart muscle thickening.  Assess heart valve function. LET [facility] CARE PROVIDER KNOW ABOUT:  Any allergies you have.  All medicines you are taking, including vitamins, herbs, eye drops, creams, and over-the-counter medicines.  Previous problems you or members of your family have had with the use of anesthetics.  Any blood disorders you have.  Previous surgeries you have had.  Medical conditions you have.  Possibility of pregnancy, if this applies. BEFORE THE PROCEDURE  No special preparation is needed. Eat and drink normally.  PROCEDURE   In order to produce an image of your  heart, gel will be applied to your chest and a wand-like tool (transducer) will be moved over your chest. The gel will help transmit the sound waves from the transducer. The sound waves will harmlessly bounce off your heart to allow the heart images to be captured in real-time motion. These images will then be recorded.  You may need an IV to receive a medicine that improves the quality of the pictures. AFTER THE PROCEDURE You may return to your normal schedule including diet, activities, and medicines, unless your health care provider tells you otherwise.   This information is not intended to replace advice given to you by your health care provider. Make sure you discuss any questions you have with your health care provider.   Document Released: 10/25/2000 Document Revised: 11/18/2014 Document Reviewed: 07/05/2013 Elsevier Interactive Patient Education [employer].

## 2015-11-22 NOTE — Assessment & Plan Note (Signed)
Blood pressure is well controlled on today's visit. No changes made to the medications. 

## 2015-11-22 NOTE — Assessment & Plan Note (Signed)
Tolerating her nasal pillow, CPAP

## 2015-11-22 NOTE — Progress Notes (Signed)
Patient ID: Morgan Kent, female    DOB: 1981/10/11, 35 y.o.   MRN: [id number]  HPI Comments: Morgan Kent is a pleasant 35 year old woman with morbid obesity, lupus, history of neck surgery, sleep apnea on nasal CPAP, echocardiogram June 2016 showing ejection fraction 45% (images not available), initially referred for shortness of breath. Seen by Dr. Cathie Olden in this office, started on carvedilol.  In follow-up today, she reports that she is doing relatively well. She currently takes carvedilol and HCTZ in addition to many other noncardiac related medications She does not feel that she has had side effects on the high-dose Coreg 25 mill grams twice a day Dose was increased for heart rate control,  had sinus tachycardia on previous visits  She denies any significant lower extremity edema, no abdominal bloating  She does have shortness of breath with certain activities such as grocery shopping No regular exercise program Chronic pain in her neck and lower back  EKG on today's visit shows sinus tachycardia with rate 10 1 bpm, no significant ST or T-wave changes     Allergies  Allergen Reactions  . Cephalexin Itching    Per [facility] records    Current Outpatient Prescriptions on File Prior to Visit  Medication Sig Dispense Refill  . adapalene (DIFFERIN) 0.1 % gel Apply 1 application topically at bedtime. Apply to forehead and chin    . antiseptic oral rinse (BIOTENE) LIQD 15 mLs by Mouth Rinse route 2 (two) times daily.     . beclomethasone (QVAR) 80 MCG/ACT inhaler Inhale 1 puff into the lungs 2 (two) times daily.    . cetirizine (ZYRTEC) 10 MG tablet Take 10 mg by mouth daily.    . clobetasol (OLUX) 0.05 % topical foam Apply 1 application topically at bedtime as needed (for scalp). Apply all over body    . clobetasol (TEMOVATE) 0.05 % external solution Apply 1 application topically 2 (two) times daily. Apply to scalp    . desonide (DESOWEN) 0.05 % cream Apply 1 application  topically daily as needed (for lupus). Apply all over body    . diazepam (VALIUM) 5 MG tablet Take 1 tablet (5 mg total) by mouth every 6 (six) hours as needed for muscle spasms. 100 tablet 0  . diclofenac sodium (VOLTAREN) 1 % GEL Apply 1 application topically daily as needed (for pain).     Marland Kitchen docusate sodium 100 MG CAPS Take 100 mg by mouth 2 (two) times daily. (Patient taking differently: Take 100 mg by mouth daily. ) 60 capsule 0  . fluticasone (FLONASE) 50 MCG/ACT nasal spray Place 1 spray into both nostrils at bedtime.     . gabapentin (NEURONTIN) 300 MG capsule Take 600 mg by mouth 3 (three) times daily.     . hydroxychloroquine (PLAQUENIL) 200 MG tablet Take 200 mg by mouth daily.     . meloxicam (MOBIC) 7.5 MG tablet Take 7.5 mg by mouth 2 (two) times daily as needed (joint pain).    . Multiple Vitamin (MULTIVITAMIN WITH MINERALS) TABS tablet Take 1 tablet by mouth daily.    . norethindrone (SHAROBEL) 0.35 MG tablet Take 1 tablet by mouth daily.    Marland Kitchen oxyCODONE-acetaminophen (PERCOCET/ROXICET) 5-325 MG per tablet Take 1 tablet by mouth every 4 (four) hours as needed.     Vladimir Faster Glycol-Propyl Glycol (SYSTANE ULTRA) 0.4-0.3 % SOLN Place 1 drop into both eyes daily as needed (for dry eyes).      No current facility-administered medications on file prior  to visit.    Past Medical History  Diagnosis Date  . PONV (postoperative nausea and vomiting)   . Hypertension   . Depression   . Anxiety   . History of kidney stones   . Headache(784.0)   . Dizziness   . Arthritis     rheumo possible  . Lupus ([place])   . Chiari malformation   . Lupus ([place])   . Nerve damage     NECK    Past Surgical History  Procedure Laterality Date  . Craniotomy  14,06    chiari  . Carpel tunnel  Left 2014  . Dental surgery    . Hernia repair      umbilical  . Suboccipital craniectomy cervical laminectomy N/A 09/29/2013    Procedure: SUBOCCIPITAL CRANIECTOMY CERVICAL LAMINECTOMY/DURAPLASTY;   Surgeon: Ophelia Charter, MD;  Location: [place] [facility]UR[facility] ORS;  Service: Neurosurgery;  Laterality: N/A;  posterior  . Suboccipital craniectomy cervical laminectomy N/A 06/23/2014    Procedure: SUBOCCIPITAL CRANIECTOMY CERVICAL LAMINECTOMY/DURAPLASTY;  Surgeon: Newman Pies, MD;  Location: [place] [facility]UR[facility];  Service: Neurosurgery;  Laterality: N/A;  suboccipital craniectomy with cervical laminectomy and duraplasty  . Placement of lumbar drain N/A 07/07/2014    Procedure: PLACEMENT OF LUMBAR DRAIN AND CLOSURE OF CERVICAL INCISION;  Surgeon: Newman Pies, MD;  Location: [place] NEUR[facility] ORS;  Service: Neurosurgery;  Laterality: N/A;    Social History  reports that she has never smoked. She has never used smokeless tobacco. She reports that she does not drink alcohol or use illicit drugs.  Family History family history includes Heart disease in her father; Hypertension in her father and mother; Thyroid disease in her mother.   Review of Systems  Constitutional: Negative.   Respiratory: Positive for shortness of breath.   Cardiovascular: Negative.   Gastrointestinal: Negative.   Musculoskeletal: Positive for back pain, arthralgias and neck pain.  Neurological: Negative.   Hematological: Negative.   Psychiatric/Behavioral: Negative.   All other systems reviewed and are negative.   BP 100/64 mmHg  Pulse 101  Ht 5\' 6"  (1.676 m)  Wt 263 lb 4 oz (119.409 kg)  BMI 42.51 kg/m2  Physical Exam  Constitutional: She is oriented to person, place, and time. She appears well-developed and well-nourished.  HENT:  Head: Normocephalic.  Nose: Nose normal.  Mouth/Throat: Oropharynx is clear and moist.  Eyes: Conjunctivae are normal. Pupils are equal, round, and reactive to light.  Neck: Normal range of motion. Neck supple. No JVD present.  Cardiovascular: Normal rate, regular rhythm, normal heart sounds and intact distal pulses.  Exam reveals no gallop and no friction rub.   No murmur heard. Pulmonary/Chest:  Effort normal and breath sounds normal. No respiratory distress. She has no wheezes. She has no rales. She exhibits no tenderness.  Abdominal: Soft. Bowel sounds are normal. She exhibits no distension. There is no tenderness.  Musculoskeletal: Normal range of motion. She exhibits no edema or tenderness.  Lymphadenopathy:    She has no cervical adenopathy.  Neurological: She is alert and oriented to person, place, and time. Coordination normal.  Skin: Skin is warm and dry. No rash noted. No erythema.  Psychiatric: She has a normal mood and affect. Her behavior is normal. Judgment and thought content normal.

## 2015-11-30 ENCOUNTER — Other Ambulatory Visit: Payer: Self-pay

## 2015-11-30 ENCOUNTER — Ambulatory Visit (INDEPENDENT_AMBULATORY_CARE_PROVIDER_SITE_OTHER): Payer: Medicaid Other

## 2015-11-30 DIAGNOSIS — I429 Cardiomyopathy, unspecified: Secondary | ICD-10-CM

## 2015-11-30 DIAGNOSIS — R000 Tachycardia, unspecified: Secondary | ICD-10-CM

## 2015-12-27 ENCOUNTER — Other Ambulatory Visit (HOSPITAL_COMMUNITY): Payer: Self-pay | Admitting: Neurosurgery

## 2015-12-29 ENCOUNTER — Other Ambulatory Visit (HOSPITAL_COMMUNITY): Payer: Self-pay | Admitting: Neurosurgery

## 2015-12-29 DIAGNOSIS — G935 Compression of brain: Secondary | ICD-10-CM

## 2016-01-16 ENCOUNTER — Ambulatory Visit (HOSPITAL_COMMUNITY): Payer: Medicaid Other

## 2016-01-16 ENCOUNTER — Ambulatory Visit (HOSPITAL_COMMUNITY): Admission: RE | Admit: 2016-01-16 | Payer: Medicaid Other | Source: Ambulatory Visit

## 2016-01-31 ENCOUNTER — Ambulatory Visit (HOSPITAL_COMMUNITY): Payer: Medicaid Other

## 2016-01-31 ENCOUNTER — Ambulatory Visit (HOSPITAL_COMMUNITY): Admission: RE | Admit: 2016-01-31 | Payer: Medicaid Other | Source: Ambulatory Visit

## 2016-01-31 ENCOUNTER — Ambulatory Visit (HOSPITAL_COMMUNITY)
Admission: RE | Admit: 2016-01-31 | Discharge: 2016-01-31 | Disposition: A | Discharge status: Home / Self Care | Payer: Medicaid Other | Source: Ambulatory Visit | Attending: Neurosurgery | Admitting: Neurosurgery

## 2016-01-31 DIAGNOSIS — M50222 Other cervical disc displacement at C5-C6 level: Secondary | ICD-10-CM | POA: Diagnosis not present

## 2016-01-31 DIAGNOSIS — M50223 Other cervical disc displacement at C6-C7 level: Secondary | ICD-10-CM | POA: Insufficient documentation

## 2016-01-31 DIAGNOSIS — G950 Syringomyelia and syringobulbia: Secondary | ICD-10-CM | POA: Insufficient documentation

## 2016-01-31 DIAGNOSIS — G935 Compression of brain: Secondary | ICD-10-CM | POA: Insufficient documentation

## 2016-04-17 DIAGNOSIS — Z124 Encounter for screening for malignant neoplasm of cervix: Secondary | ICD-10-CM | POA: Diagnosis not present

## 2016-04-17 DIAGNOSIS — R87610 Atypical squamous cells of undetermined significance on cytologic smear of cervix (ASC-US): Secondary | ICD-10-CM | POA: Diagnosis not present

## 2016-04-17 DIAGNOSIS — L930 Discoid lupus erythematosus: Secondary | ICD-10-CM | POA: Diagnosis not present

## 2016-04-17 DIAGNOSIS — R87810 Cervical high risk human papillomavirus (HPV) DNA test positive: Secondary | ICD-10-CM | POA: Diagnosis not present

## 2016-04-17 DIAGNOSIS — E669 Obesity, unspecified: Secondary | ICD-10-CM | POA: Diagnosis not present

## 2016-04-29 DIAGNOSIS — J069 Acute upper respiratory infection, unspecified: Secondary | ICD-10-CM | POA: Diagnosis not present

## 2016-05-01 DIAGNOSIS — Z79891 Long term (current) use of opiate analgesic: Secondary | ICD-10-CM | POA: Diagnosis not present

## 2016-05-13 DIAGNOSIS — R0902 Hypoxemia: Secondary | ICD-10-CM | POA: Diagnosis not present

## 2016-05-13 DIAGNOSIS — L930 Discoid lupus erythematosus: Secondary | ICD-10-CM | POA: Diagnosis not present

## 2016-05-13 DIAGNOSIS — J449 Chronic obstructive pulmonary disease, unspecified: Secondary | ICD-10-CM | POA: Diagnosis not present

## 2016-05-20 DIAGNOSIS — E669 Obesity, unspecified: Secondary | ICD-10-CM | POA: Diagnosis not present

## 2016-05-20 DIAGNOSIS — R87810 Cervical high risk human papillomavirus (HPV) DNA test positive: Secondary | ICD-10-CM | POA: Diagnosis not present

## 2016-05-20 DIAGNOSIS — I10 Essential (primary) hypertension: Secondary | ICD-10-CM | POA: Diagnosis not present

## 2016-05-31 ENCOUNTER — Ambulatory Visit: Payer: Medicaid Other | Admitting: Cardiovascular Disease

## 2016-05-31 DIAGNOSIS — Z79891 Long term (current) use of opiate analgesic: Secondary | ICD-10-CM | POA: Diagnosis not present

## 2016-05-31 DIAGNOSIS — M25512 Pain in left shoulder: Secondary | ICD-10-CM | POA: Diagnosis not present

## 2016-05-31 DIAGNOSIS — M545 Low back pain: Secondary | ICD-10-CM | POA: Diagnosis not present

## 2016-05-31 DIAGNOSIS — M542 Cervicalgia: Secondary | ICD-10-CM | POA: Diagnosis not present

## 2016-05-31 DIAGNOSIS — G894 Chronic pain syndrome: Secondary | ICD-10-CM | POA: Diagnosis not present

## 2016-05-31 DIAGNOSIS — M25511 Pain in right shoulder: Secondary | ICD-10-CM | POA: Diagnosis not present

## 2016-06-05 DIAGNOSIS — R0602 Shortness of breath: Secondary | ICD-10-CM | POA: Diagnosis not present

## 2016-06-07 DIAGNOSIS — N72 Inflammatory disease of cervix uteri: Secondary | ICD-10-CM | POA: Diagnosis not present

## 2016-06-07 DIAGNOSIS — R87619 Unspecified abnormal cytological findings in specimens from cervix uteri: Secondary | ICD-10-CM | POA: Diagnosis not present

## 2016-06-07 DIAGNOSIS — B977 Papillomavirus as the cause of diseases classified elsewhere: Secondary | ICD-10-CM | POA: Diagnosis not present

## 2016-06-07 DIAGNOSIS — R87810 Cervical high risk human papillomavirus (HPV) DNA test positive: Secondary | ICD-10-CM | POA: Diagnosis not present

## 2016-06-20 DIAGNOSIS — I10 Essential (primary) hypertension: Secondary | ICD-10-CM | POA: Diagnosis not present

## 2016-06-20 DIAGNOSIS — E669 Obesity, unspecified: Secondary | ICD-10-CM | POA: Diagnosis not present

## 2016-06-20 DIAGNOSIS — G5603 Carpal tunnel syndrome, bilateral upper limbs: Secondary | ICD-10-CM | POA: Diagnosis not present

## 2016-06-20 DIAGNOSIS — J449 Chronic obstructive pulmonary disease, unspecified: Secondary | ICD-10-CM | POA: Diagnosis not present

## 2016-06-20 DIAGNOSIS — L930 Discoid lupus erythematosus: Secondary | ICD-10-CM | POA: Diagnosis not present

## 2016-06-23 IMAGING — MR MRI CERVICAL SPINE WITHOUT CONTRAST
5 series · 41 of 48 positions shown · non-contrast
Comparison: 11/25/2012.

CLINICAL DATA: Two prior operations on the craniocervical junction.
History of Chiari malformation, most recently November 2012. No
improvement since prior operations. Headache. Neck and shoulder
pain. Tingling in both arms.

EXAM:
MRI CERVICAL SPINE WITHOUT CONTRAST
TECHNIQUE: Multiplanar, multisequence MR imaging of the cervical spine was
performed. No intravenous contrast was administered.

[Series 2: T2 · sagittal · 3.0mm · 0.70mm/px · 6 of 15 slices shown (1 of 2)]
[im 1/15]
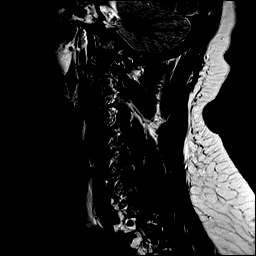
[im 3/15]
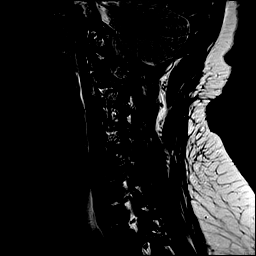
[im 6/15]
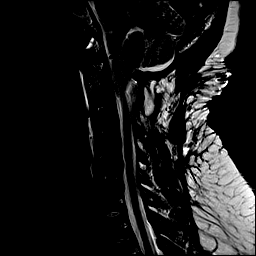
[im 9/15]
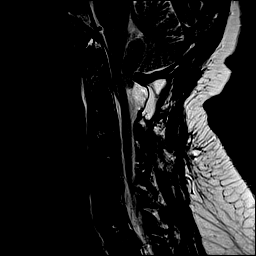
[im 12/15]
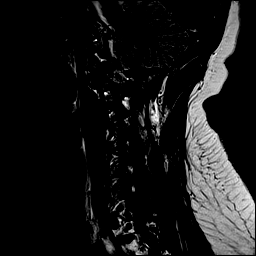
[im 15/15]
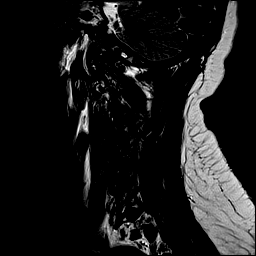

[Series 3: T1 · sagittal · 3.0mm · 0.70mm/px · 7 of 15 slices shown]
[im 1/15]
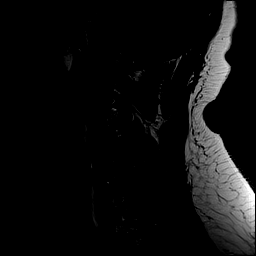
[im 3/15]
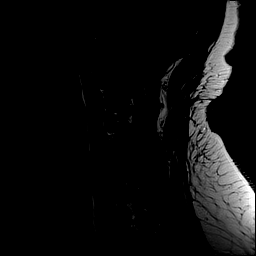
[im 5/15]
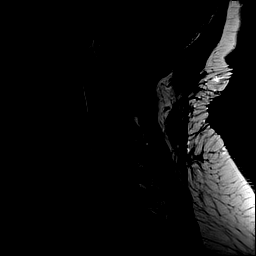
[im 8/15]
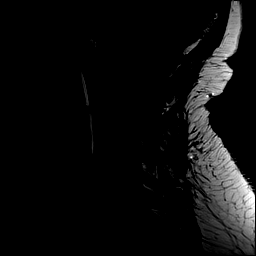
[im 10/15]
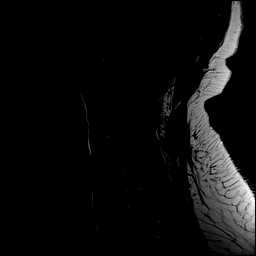
[im 12/15]
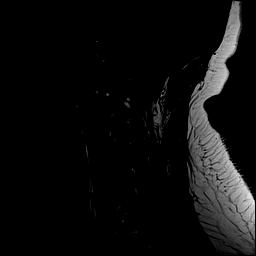
[im 15/15]
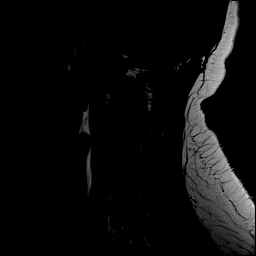

[Series 4: STIR · sagittal · 3.0mm · 0.78mm/px · 7 of 15 slices shown]
[im 1/15]
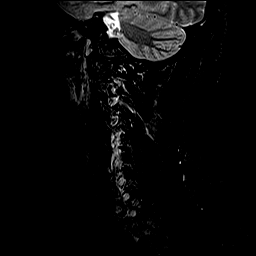
[im 3/15]
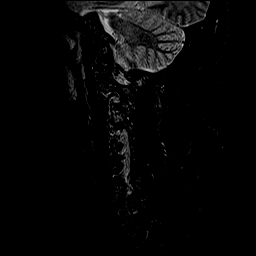
[im 5/15]
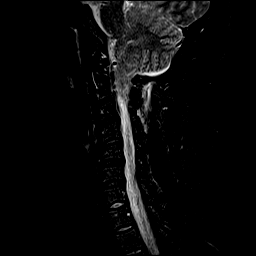
[im 8/15]
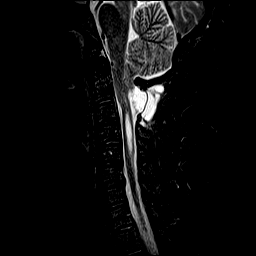
[im 10/15]
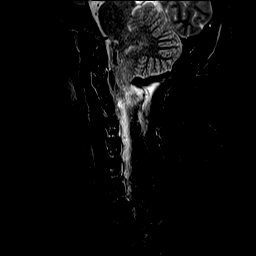
[im 12/15]
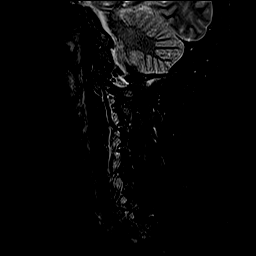
[im 15/15]
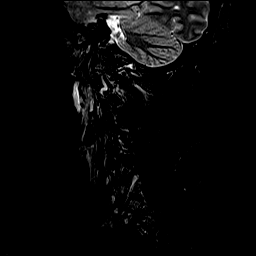

[Series 5: T2 · axial · 3.0mm · 0.70mm/px · z∈[-88,+31]mm · 13 of 32 slices shown (2 of 2)]
[im 1/32]
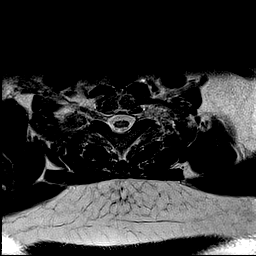
[im 3/32]
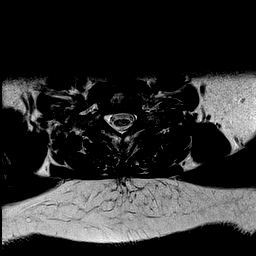
[im 5/32]
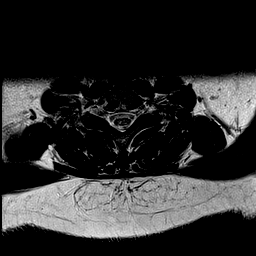
[im 8/32]
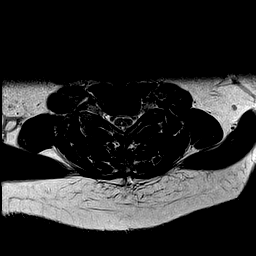
[im 10/32]
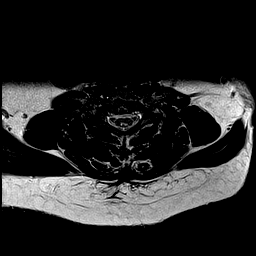
[im 12/32]
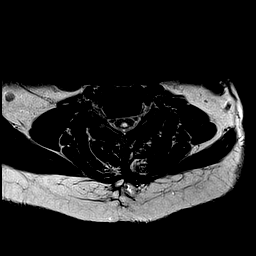
[im 15/32]
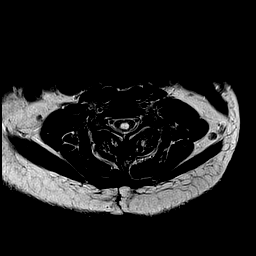
[im 17/32]
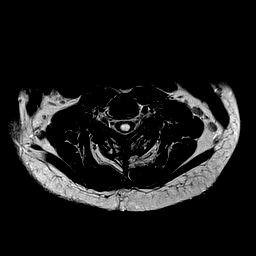
[im 20/32]
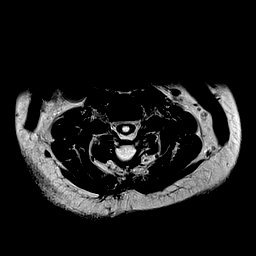
[im 22/32]
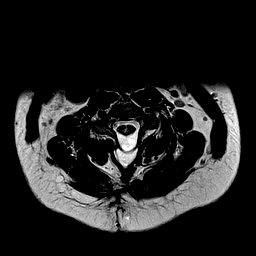
[im 24/32]
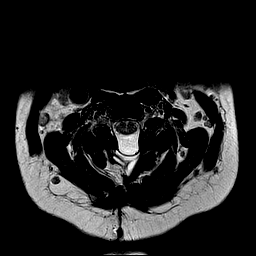
[im 27/32]
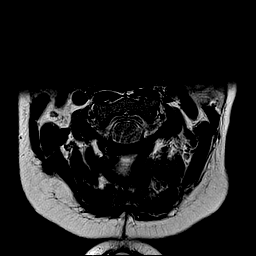
[im 32/32]
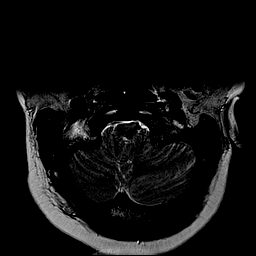

[Series 6: mpgr ax · axial · 3.0mm · 0.35mm/px · z∈[-88,+31]mm · 8 of 32 slices shown]
[im 1/32]
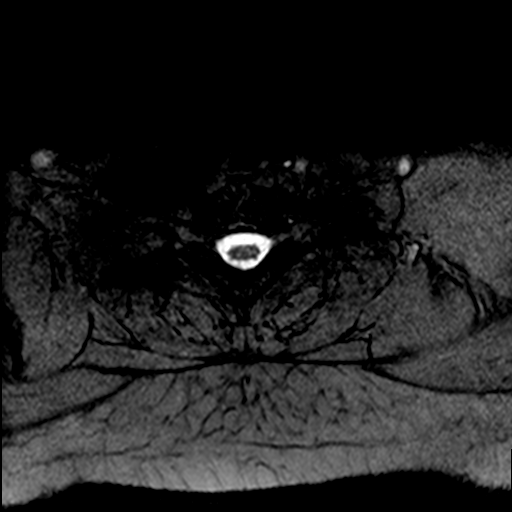
[im 5/32]
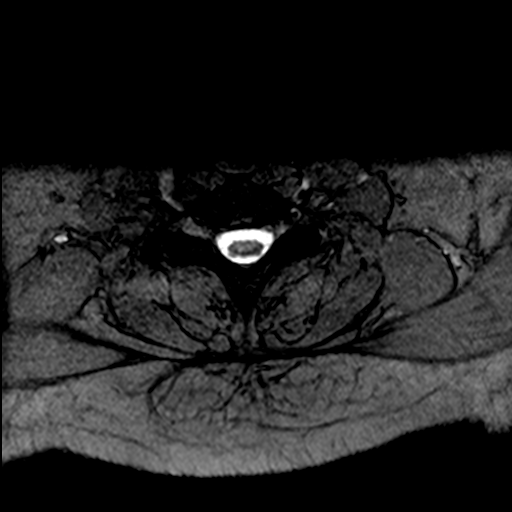
[im 10/32]
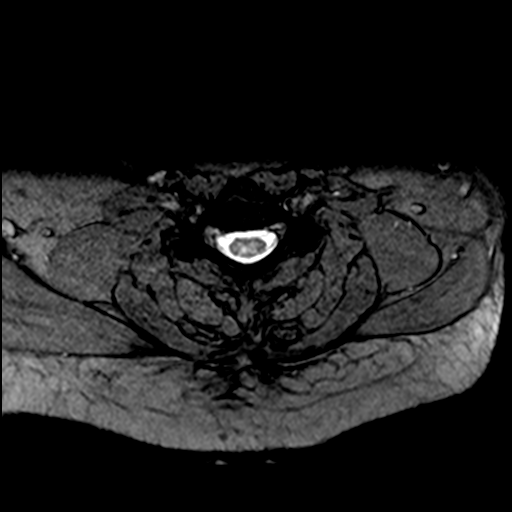
[im 15/32]
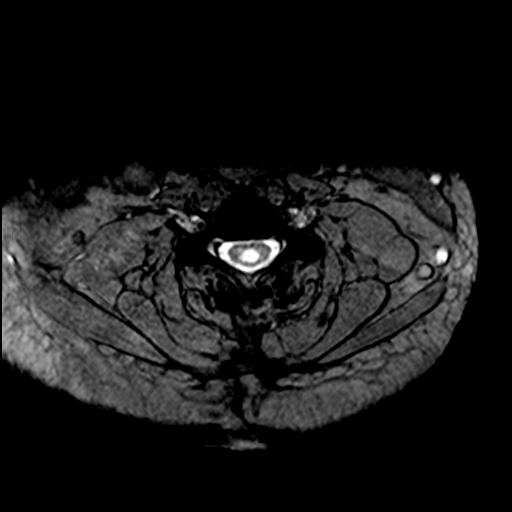
[im 17/32]
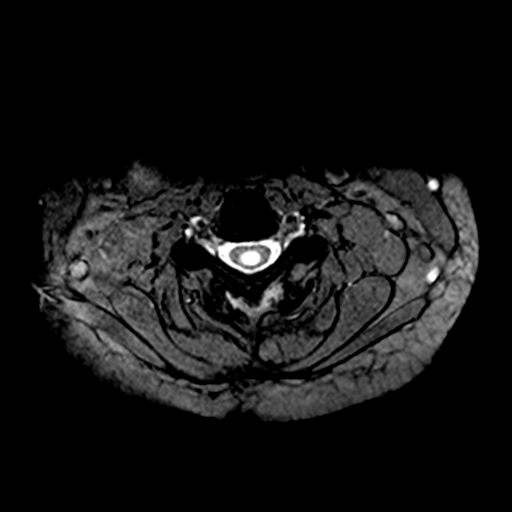
[im 22/32]
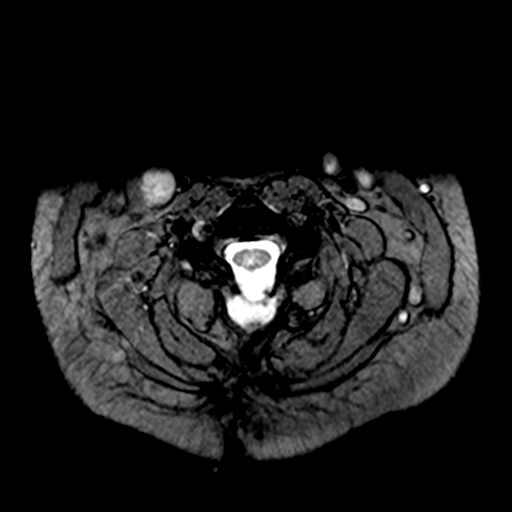
[im 27/32]
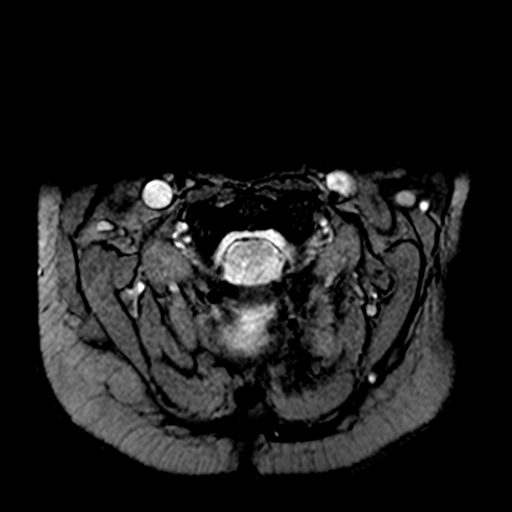
[im 32/32]
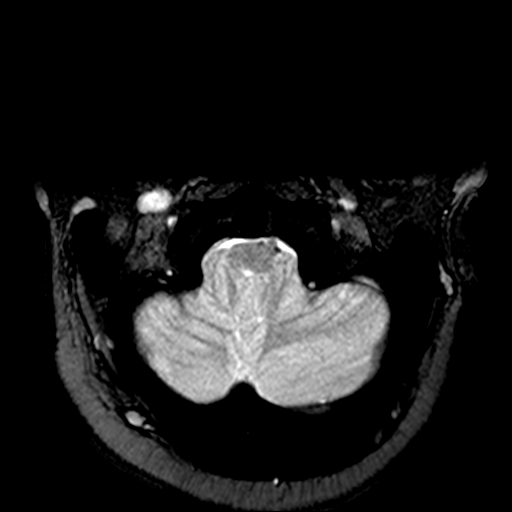

[41 of 48 positions shown; findings below may reference images not displayed]

FINDINGS: Alignment: Reversal of the normal cervical lordosis centered around
C4.

Vertebrae: Normal marrow signal.  No fracture.

Cord: Cervical cord syrinx remains present. The largest diameter of
the cervical cord syrinx is dorsal to the C4 vertebra where it
measures 4.9 mm AP x 6.3 cm transverse. This has increased in size
since 07/30/2013 when measured at the same level. The length of the
syrinx is 45 mm. This is also slightly larger than on the prior exam
when it measured 42 mm.

Dorsal to the C2 and C3, there is heterogeneous signal in the
central canal in the area of bony decompression that is most
compatible with flow related artifact.

Posterior Fossa: Suboccipital craniectomy has been performed. There
is redundant low signal soft tissue probably representing a
combination of scarring, fascia and thickened dura in the expected
location of the occipital bone. This has mass effect on the
cerebellar tonsils, compressing them anteriorly.

The C1 through C3 posterior arches have been removed. The appearance
of the cerebellar tonsils is unchanged, extending downward dorsal to
the C2 body level. There is a fluid collection in the posterior
paraspinal soft tissues that may represent seroma or a small
pseudomeningocele. This measures 16 mm transverse, 28 mm
craniocaudal and 10 mm AP.

Vertebral Arteries: Flow voids present bilaterally.

Paraspinal tissues: Prevertebral soft tissues appear within normal
limits. Expected scarring associated with prior operations.

Disc levels:

C2-C3:  Negative.

C3-C4:  Negative.

C4-C5:  Negative.

C5-C6: Mild disc desiccation. Mild RIGHT uncovertebral spurring and
shallow RIGHT eccentric disc bulging. The central canal appears
patent. Minimal encroachment on the RIGHT C6 nerve as it enters the
foramen.

C6-C7: Small central protrusion without resulting stenosis. Foramina
patent.

C7-T1: Normal.
IMPRESSION: 1. Postoperative changes of suboccipital craniectomy. Chiari 1
malformation. Small fluid collection in the upper cervical spine
which may represent postoperative seroma or small pseudomeningocele.
2. There is a recurrent shelf like band of low signal soft tissue
compressing the cerebellar tonsils anteriorly.
3. Increasing cervical cord syrinx. Both diameter and length of the
syrinx have increased.
4. Mild cervical spondylosis.

## 2016-06-27 DIAGNOSIS — M064 Inflammatory polyarthropathy: Secondary | ICD-10-CM | POA: Diagnosis not present

## 2016-06-28 DIAGNOSIS — Z79891 Long term (current) use of opiate analgesic: Secondary | ICD-10-CM | POA: Diagnosis not present

## 2016-07-24 DIAGNOSIS — I10 Essential (primary) hypertension: Secondary | ICD-10-CM | POA: Diagnosis not present

## 2016-07-24 DIAGNOSIS — L930 Discoid lupus erythematosus: Secondary | ICD-10-CM | POA: Diagnosis not present

## 2016-07-24 DIAGNOSIS — J449 Chronic obstructive pulmonary disease, unspecified: Secondary | ICD-10-CM | POA: Diagnosis not present

## 2016-07-24 DIAGNOSIS — M26609 Unspecified temporomandibular joint disorder, unspecified side: Secondary | ICD-10-CM | POA: Diagnosis not present

## 2016-07-24 DIAGNOSIS — F331 Major depressive disorder, recurrent, moderate: Secondary | ICD-10-CM | POA: Diagnosis not present

## 2016-07-24 DIAGNOSIS — E669 Obesity, unspecified: Secondary | ICD-10-CM | POA: Diagnosis not present

## 2016-07-29 ENCOUNTER — Ambulatory Visit: Payer: Medicaid Other | Admitting: Cardiovascular Disease

## 2016-07-29 DIAGNOSIS — Z79891 Long term (current) use of opiate analgesic: Secondary | ICD-10-CM | POA: Diagnosis not present

## 2016-07-29 DIAGNOSIS — M25511 Pain in right shoulder: Secondary | ICD-10-CM | POA: Diagnosis not present

## 2016-07-29 DIAGNOSIS — M542 Cervicalgia: Secondary | ICD-10-CM | POA: Diagnosis not present

## 2016-07-29 DIAGNOSIS — M25512 Pain in left shoulder: Secondary | ICD-10-CM | POA: Diagnosis not present

## 2016-07-29 DIAGNOSIS — G501 Atypical facial pain: Secondary | ICD-10-CM | POA: Diagnosis not present

## 2016-07-29 DIAGNOSIS — M545 Low back pain: Secondary | ICD-10-CM | POA: Diagnosis not present

## 2016-07-29 DIAGNOSIS — G894 Chronic pain syndrome: Secondary | ICD-10-CM | POA: Diagnosis not present

## 2016-08-20 ENCOUNTER — Encounter: Payer: Self-pay | Admitting: Cardiovascular Disease

## 2016-08-20 ENCOUNTER — Ambulatory Visit (INDEPENDENT_AMBULATORY_CARE_PROVIDER_SITE_OTHER): Payer: Medicare Other | Admitting: Cardiovascular Disease

## 2016-08-20 DIAGNOSIS — G4733 Obstructive sleep apnea (adult) (pediatric): Secondary | ICD-10-CM

## 2016-08-20 DIAGNOSIS — Z23 Encounter for immunization: Secondary | ICD-10-CM

## 2016-08-20 DIAGNOSIS — R0602 Shortness of breath: Secondary | ICD-10-CM | POA: Diagnosis not present

## 2016-08-20 DIAGNOSIS — G894 Chronic pain syndrome: Secondary | ICD-10-CM

## 2016-08-20 DIAGNOSIS — Z9989 Dependence on other enabling machines and devices: Secondary | ICD-10-CM

## 2016-08-20 DIAGNOSIS — R000 Tachycardia, unspecified: Secondary | ICD-10-CM

## 2016-08-20 DIAGNOSIS — I10 Essential (primary) hypertension: Secondary | ICD-10-CM | POA: Diagnosis not present

## 2016-08-20 DIAGNOSIS — Q0700 Arnold-Chiari syndrome without spina bifida or hydrocephalus: Secondary | ICD-10-CM | POA: Diagnosis not present

## 2016-08-20 DIAGNOSIS — L930 Discoid lupus erythematosus: Secondary | ICD-10-CM | POA: Diagnosis not present

## 2016-08-20 NOTE — Progress Notes (Signed)
Cardiology Office Note  Date:  08/20/2016   ID:  Kent, Morgan [birth year], MRN [id number]  PCP:  Christie Nottingham., PA   Chief Complaint  Patient presents with  . other    6 month f/u c/o sob. Meds reviewed verbally with pt.    HPI:  Morgan Kent is a pleasant 35 year old woman with morbid obesity, lupus, history of neck surgery With chronic pain down her arms bilaterally, sleep apnea on nasal CPAP,  who presents for follow-up of her symptoms of shortness of breath  In follow-up, she reports that she is doing well except for pain down her arms bilaterally. Pain comes from her neck, followed by the pain clinic Taking Neurontin for years Sees the pain clinic in [place] echocardiogram reviewed with her 11/2015: normal EF  She has been using phentermine for weight loss weight is down >20 pounds Feels better, improved shortness of breath Denies any leg edema concerning for edema No significant tachycardia  EKG on today's visit shows normal sinus rhythm with rate 90 bpm, no significant ST or T-wave changes   PMH:   has a past medical history of Anxiety; Arthritis; Chiari malformation; Depression; Dizziness; Headache(784.0); History of kidney stones; Hypertension; Lupus; Lupus; Nerve damage; and PONV (postoperative nausea and vomiting).  PSH:    Past Surgical History:  Procedure Laterality Date  . carpel tunnel  Left 2014  . CRANIOTOMY  14,06   chiari  . DENTAL SURGERY    . HERNIA REPAIR     umbilical  . PLACEMENT OF LUMBAR DRAIN N/A 07/07/2014   Procedure: PLACEMENT OF LUMBAR DRAIN AND CLOSURE OF CERVICAL INCISION;  Surgeon: Newman Pies, MD;  Location: [place] [facility]UR[facility] ORS;  Service: Neurosurgery;  Laterality: N/A;  . SUBOCCIPITAL CRANIECTOMY CERVICAL LAMINECTOMY N/A 09/29/2013   Procedure: SUBOCCIPITAL CRANIECTOMY CERVICAL LAMINECTOMY/DURAPLASTY;  Surgeon: Ophelia Charter, MD;  Location: [place] [facility]UR[facility] ORS;  Service: Neurosurgery;  Laterality: N/A;  posterior  .  SUBOCCIPITAL CRANIECTOMY CERVICAL LAMINECTOMY N/A 06/23/2014   Procedure: SUBOCCIPITAL CRANIECTOMY CERVICAL LAMINECTOMY/DURAPLASTY;  Surgeon: Newman Pies, MD;  Location: [place] NEUR[facility] ORS;  Service: Neurosurgery;  Laterality: N/A;  suboccipital craniectomy with cervical laminectomy and duraplasty    Current Outpatient Prescriptions  Medication Sig Dispense Refill  . adapalene (DIFFERIN) 0.1 % gel Apply 1 application topically at bedtime. Apply to forehead and chin    . albuterol (PROVENTIL HFA;VENTOLIN HFA) 108 (90 Base) MCG/ACT inhaler Inhale 1 puff into the lungs every 6 (six) hours as needed for wheezing or shortness of breath.    Marland Kitchen antiseptic oral rinse (BIOTENE) LIQD 15 mLs by Mouth Rinse route 2 (two) times daily.     . beclomethasone (QVAR) 80 MCG/ACT inhaler Inhale 1 puff into the lungs 2 (two) times daily.    . carvedilol (COREG) 25 MG tablet Take 1 tablet (25 mg total) by mouth 2 (two) times daily. 180 tablet 3  . cetirizine (ZYRTEC) 10 MG tablet Take 10 mg by mouth daily.    . clobetasol (OLUX) 0.05 % topical foam Apply 1 application topically at bedtime as needed (for scalp). Apply all over body    . clobetasol (TEMOVATE) 0.05 % external solution Apply 1 application topically 2 (two) times daily. Apply to scalp    . desonide (DESOWEN) 0.05 % cream Apply 1 application topically daily as needed (for lupus). Apply all over body    . diclofenac sodium (VOLTAREN) 1 % GEL Apply 1 application topically daily as needed (for pain).     Marland Kitchen  fluticasone (FLONASE) 50 MCG/ACT nasal spray Place 1 spray into both nostrils at bedtime.     . gabapentin (NEURONTIN) 300 MG capsule Take 600 mg by mouth 3 (three) times daily.     . hydrochlorothiazide (HYDRODIURIL) 25 MG tablet Take 1 tablet (25 mg total) by mouth daily. 90 tablet 3  . hydroxychloroquine (PLAQUENIL) 200 MG tablet Take 200 mg by mouth daily.     Marland Kitchen KETOCONAZOLE, TOPICAL, 1 % SHAM Apply topically as directed.    . meloxicam (MOBIC) 7.5 MG  tablet Take 7.5 mg by mouth 2 (two) times daily as needed (joint pain).    Marland Kitchen oxyCODONE-acetaminophen (PERCOCET/ROXICET) 5-325 MG per tablet Take 1 tablet by mouth every 4 (four) hours as needed.     . phentermine 37.5 MG capsule Take 37.5 mg by mouth every morning.    Vladimir Faster Glycol-Propyl Glycol (SYSTANE ULTRA) 0.4-0.3 % SOLN Place 1 drop into both eyes daily as needed (for dry eyes).     . TRINTELLIX 10 MG TABS 10 mg daily.   0   No current facility-administered medications for this visit.      Allergies:   Cephalexin   Social History:  The patient  reports that she has never smoked. She has never used smokeless tobacco. She reports that she does not drink alcohol or use drugs.   Family History:   family history includes Heart disease in her father; Hypertension in her father and mother; Thyroid disease in her mother.    Review of Systems: Review of Systems  Constitutional: Negative.   Respiratory: Negative.   Cardiovascular: Negative.   Gastrointestinal: Negative.   Musculoskeletal: Negative.        Arm pain, back pain, shoulder pain, neck pain  Neurological: Negative.   Psychiatric/Behavioral: Negative.   All other systems reviewed and are negative.    PHYSICAL EXAM: VS:  BP 122/74 (BP Location: Left Arm, Patient Position: Sitting, Cuff Size: Normal)   Pulse 90   Ht 5\' 6"  (1.676 m)   Wt 242 lb 12 oz (110.1 kg)   BMI 39.18 kg/m  , BMI Body mass index is 39.18 kg/m. GEN: Well nourished, well developed, in no acute distress, obese  HEENT: normal  Neck: no JVD, carotid bruits, or masses Cardiac: RRR; no murmurs, rubs, or gallops,no edema  Respiratory:  clear to auscultation bilaterally, normal work of breathing GI: soft, nontender, nondistended, + BS MS: no deformity or atrophy  Skin: warm and dry, no rash Neuro:  Strength and sensation are intact Psych: euthymic mood, full affect    Recent Labs: No results found for requested labs within last 8760 hours.     Lipid Panel No results found for: CHOL, HDL, LDLCALC, TRIG    Wt Readings from Last 3 Encounters:  08/20/16 242 lb 12 oz (110.1 kg)  11/22/15 263 lb 4 oz (119.4 kg)  08/21/15 265 lb 8 oz (120.4 kg)       ASSESSMENT AND PLAN:  Morbid obesity ([place]) 20 pound weight loss on phentermine Denies having tachycardia symptoms  Encounter for immunization - Plan: Flu Vaccine QUAD 36+ mos IM Flu shot provided  Essential hypertension Improved blood pressure with recent weight loss No changes to her medications  Obstructive sleep apnea on CPAP Compliant with her CPAP  Shortness of breath No changes to her regiment. Shortness of breath symptoms improving with weight loss  Tachycardia Recommended she continue on her carvedilol, stable heart rate and blood pressure  Chronic pain, arms Chronic neck, shoulder,  back, arm pain Discussed her management, she is on Neurontin seen by the pain clinic   Total encounter time more than 25 minutes  Greater than 50% was spent in counseling and coordination of care with the patient   Disposition:   F/U  12 months   Orders Placed This Encounter  Procedures  . Flu Vaccine QUAD 36+ mos IM     Signed, Esmond Plants, M.D., Ph.D. 08/20/2016  [place], [employer]

## 2016-08-20 NOTE — Patient Instructions (Signed)

## 2016-08-22 NOTE — Addendum Note (Signed)
Addended by: Britt Bottom on: 08/22/2016 04:18 PM   Modules accepted: Orders

## 2016-08-27 DIAGNOSIS — J019 Acute sinusitis, unspecified: Secondary | ICD-10-CM | POA: Diagnosis not present

## 2016-08-27 DIAGNOSIS — J309 Allergic rhinitis, unspecified: Secondary | ICD-10-CM | POA: Diagnosis not present

## 2016-08-27 DIAGNOSIS — F331 Major depressive disorder, recurrent, moderate: Secondary | ICD-10-CM | POA: Diagnosis not present

## 2016-08-27 DIAGNOSIS — J449 Chronic obstructive pulmonary disease, unspecified: Secondary | ICD-10-CM | POA: Diagnosis not present

## 2016-08-27 DIAGNOSIS — E669 Obesity, unspecified: Secondary | ICD-10-CM | POA: Diagnosis not present

## 2016-08-27 DIAGNOSIS — I10 Essential (primary) hypertension: Secondary | ICD-10-CM | POA: Diagnosis not present

## 2016-08-28 DIAGNOSIS — L910 Hypertrophic scar: Secondary | ICD-10-CM | POA: Diagnosis not present

## 2016-08-28 DIAGNOSIS — L819 Disorder of pigmentation, unspecified: Secondary | ICD-10-CM | POA: Diagnosis not present

## 2016-08-28 DIAGNOSIS — L700 Acne vulgaris: Secondary | ICD-10-CM | POA: Diagnosis not present

## 2016-08-28 DIAGNOSIS — L930 Discoid lupus erythematosus: Secondary | ICD-10-CM | POA: Diagnosis not present

## 2016-08-30 DIAGNOSIS — M25562 Pain in left knee: Secondary | ICD-10-CM | POA: Diagnosis not present

## 2016-08-30 DIAGNOSIS — Z79891 Long term (current) use of opiate analgesic: Secondary | ICD-10-CM | POA: Diagnosis not present

## 2016-08-30 DIAGNOSIS — M542 Cervicalgia: Secondary | ICD-10-CM | POA: Diagnosis not present

## 2016-08-30 DIAGNOSIS — M545 Low back pain: Secondary | ICD-10-CM | POA: Diagnosis not present

## 2016-08-30 DIAGNOSIS — M25512 Pain in left shoulder: Secondary | ICD-10-CM | POA: Diagnosis not present

## 2016-08-30 DIAGNOSIS — G501 Atypical facial pain: Secondary | ICD-10-CM | POA: Diagnosis not present

## 2016-08-30 DIAGNOSIS — G894 Chronic pain syndrome: Secondary | ICD-10-CM | POA: Diagnosis not present

## 2016-08-30 DIAGNOSIS — M25561 Pain in right knee: Secondary | ICD-10-CM | POA: Diagnosis not present

## 2016-09-21 ENCOUNTER — Other Ambulatory Visit: Payer: Self-pay | Admitting: Cardiovascular Disease

## 2016-09-23 ENCOUNTER — Other Ambulatory Visit: Payer: Self-pay | Admitting: Cardiovascular Disease

## 2016-09-26 DIAGNOSIS — J449 Chronic obstructive pulmonary disease, unspecified: Secondary | ICD-10-CM | POA: Diagnosis not present

## 2016-09-27 DIAGNOSIS — Z79891 Long term (current) use of opiate analgesic: Secondary | ICD-10-CM | POA: Diagnosis not present

## 2016-09-27 DIAGNOSIS — J449 Chronic obstructive pulmonary disease, unspecified: Secondary | ICD-10-CM | POA: Diagnosis not present

## 2016-09-30 DIAGNOSIS — J449 Chronic obstructive pulmonary disease, unspecified: Secondary | ICD-10-CM | POA: Diagnosis not present

## 2016-09-30 DIAGNOSIS — L930 Discoid lupus erythematosus: Secondary | ICD-10-CM | POA: Diagnosis not present

## 2016-09-30 DIAGNOSIS — E669 Obesity, unspecified: Secondary | ICD-10-CM | POA: Diagnosis not present

## 2016-09-30 DIAGNOSIS — L209 Atopic dermatitis, unspecified: Secondary | ICD-10-CM | POA: Diagnosis not present

## 2016-09-30 DIAGNOSIS — I10 Essential (primary) hypertension: Secondary | ICD-10-CM | POA: Diagnosis not present

## 2016-10-28 DIAGNOSIS — Z79891 Long term (current) use of opiate analgesic: Secondary | ICD-10-CM | POA: Diagnosis not present

## 2016-11-05 DIAGNOSIS — E669 Obesity, unspecified: Secondary | ICD-10-CM | POA: Diagnosis not present

## 2016-11-05 DIAGNOSIS — L700 Acne vulgaris: Secondary | ICD-10-CM | POA: Diagnosis not present

## 2016-11-05 DIAGNOSIS — L930 Discoid lupus erythematosus: Secondary | ICD-10-CM | POA: Diagnosis not present

## 2016-11-05 DIAGNOSIS — J449 Chronic obstructive pulmonary disease, unspecified: Secondary | ICD-10-CM | POA: Diagnosis not present

## 2016-11-05 DIAGNOSIS — J019 Acute sinusitis, unspecified: Secondary | ICD-10-CM | POA: Diagnosis not present

## 2016-11-18 DIAGNOSIS — J4520 Mild intermittent asthma, uncomplicated: Secondary | ICD-10-CM | POA: Diagnosis not present

## 2016-11-18 DIAGNOSIS — Z6841 Body Mass Index (BMI) 40.0 and over, adult: Secondary | ICD-10-CM | POA: Diagnosis not present

## 2016-11-18 DIAGNOSIS — R0602 Shortness of breath: Secondary | ICD-10-CM | POA: Diagnosis not present

## 2016-11-20 DIAGNOSIS — R21 Rash and other nonspecific skin eruption: Secondary | ICD-10-CM | POA: Diagnosis not present

## 2016-11-20 DIAGNOSIS — L932 Other local lupus erythematosus: Secondary | ICD-10-CM | POA: Diagnosis not present

## 2016-11-28 DIAGNOSIS — M542 Cervicalgia: Secondary | ICD-10-CM | POA: Diagnosis not present

## 2016-11-28 DIAGNOSIS — Z79891 Long term (current) use of opiate analgesic: Secondary | ICD-10-CM | POA: Diagnosis not present

## 2016-11-28 DIAGNOSIS — M25561 Pain in right knee: Secondary | ICD-10-CM | POA: Diagnosis not present

## 2016-11-28 DIAGNOSIS — M25511 Pain in right shoulder: Secondary | ICD-10-CM | POA: Diagnosis not present

## 2016-11-28 DIAGNOSIS — G894 Chronic pain syndrome: Secondary | ICD-10-CM | POA: Diagnosis not present

## 2016-11-28 DIAGNOSIS — M25562 Pain in left knee: Secondary | ICD-10-CM | POA: Diagnosis not present

## 2016-11-28 DIAGNOSIS — M25512 Pain in left shoulder: Secondary | ICD-10-CM | POA: Diagnosis not present

## 2016-11-28 DIAGNOSIS — G501 Atypical facial pain: Secondary | ICD-10-CM | POA: Diagnosis not present

## 2016-11-28 DIAGNOSIS — M545 Low back pain: Secondary | ICD-10-CM | POA: Diagnosis not present

## 2016-12-05 DIAGNOSIS — L930 Discoid lupus erythematosus: Secondary | ICD-10-CM | POA: Diagnosis not present

## 2016-12-05 DIAGNOSIS — R509 Fever, unspecified: Secondary | ICD-10-CM | POA: Diagnosis not present

## 2016-12-05 DIAGNOSIS — J4520 Mild intermittent asthma, uncomplicated: Secondary | ICD-10-CM | POA: Diagnosis not present

## 2016-12-05 DIAGNOSIS — J45991 Cough variant asthma: Secondary | ICD-10-CM | POA: Diagnosis not present

## 2016-12-06 ENCOUNTER — Other Ambulatory Visit: Payer: Self-pay | Admitting: Nurse Practitioner

## 2016-12-06 ENCOUNTER — Ambulatory Visit
Admission: RE | Admit: 2016-12-06 | Discharge: 2016-12-06 | Disposition: A | Discharge status: Home / Self Care | Payer: Medicare Other | Source: Ambulatory Visit | Attending: Nurse Practitioner | Admitting: Nurse Practitioner

## 2016-12-06 DIAGNOSIS — J4520 Mild intermittent asthma, uncomplicated: Secondary | ICD-10-CM | POA: Insufficient documentation

## 2016-12-06 DIAGNOSIS — R52 Pain, unspecified: Secondary | ICD-10-CM

## 2016-12-06 DIAGNOSIS — R079 Chest pain, unspecified: Secondary | ICD-10-CM | POA: Diagnosis not present

## 2016-12-06 DIAGNOSIS — R05 Cough: Secondary | ICD-10-CM | POA: Diagnosis not present

## 2016-12-06 DIAGNOSIS — J45909 Unspecified asthma, uncomplicated: Secondary | ICD-10-CM

## 2016-12-09 DIAGNOSIS — J019 Acute sinusitis, unspecified: Secondary | ICD-10-CM | POA: Diagnosis not present

## 2016-12-09 DIAGNOSIS — M064 Inflammatory polyarthropathy: Secondary | ICD-10-CM | POA: Diagnosis not present

## 2016-12-09 DIAGNOSIS — E669 Obesity, unspecified: Secondary | ICD-10-CM | POA: Diagnosis not present

## 2016-12-09 DIAGNOSIS — I10 Essential (primary) hypertension: Secondary | ICD-10-CM | POA: Diagnosis not present

## 2016-12-09 DIAGNOSIS — L930 Discoid lupus erythematosus: Secondary | ICD-10-CM | POA: Diagnosis not present

## 2016-12-10 DIAGNOSIS — F321 Major depressive disorder, single episode, moderate: Secondary | ICD-10-CM | POA: Diagnosis not present

## 2016-12-13 DIAGNOSIS — M542 Cervicalgia: Secondary | ICD-10-CM | POA: Diagnosis not present

## 2016-12-13 DIAGNOSIS — M25561 Pain in right knee: Secondary | ICD-10-CM | POA: Diagnosis not present

## 2016-12-13 DIAGNOSIS — M25511 Pain in right shoulder: Secondary | ICD-10-CM | POA: Diagnosis not present

## 2016-12-13 DIAGNOSIS — G894 Chronic pain syndrome: Secondary | ICD-10-CM | POA: Diagnosis not present

## 2016-12-13 DIAGNOSIS — G501 Atypical facial pain: Secondary | ICD-10-CM | POA: Diagnosis not present

## 2016-12-13 DIAGNOSIS — M25562 Pain in left knee: Secondary | ICD-10-CM | POA: Diagnosis not present

## 2016-12-13 DIAGNOSIS — M545 Low back pain: Secondary | ICD-10-CM | POA: Diagnosis not present

## 2016-12-13 DIAGNOSIS — Z79891 Long term (current) use of opiate analgesic: Secondary | ICD-10-CM | POA: Diagnosis not present

## 2016-12-13 DIAGNOSIS — M25512 Pain in left shoulder: Secondary | ICD-10-CM | POA: Diagnosis not present

## 2016-12-20 DIAGNOSIS — G935 Compression of brain: Secondary | ICD-10-CM | POA: Diagnosis not present

## 2016-12-20 DIAGNOSIS — Z6838 Body mass index (BMI) 38.0-38.9, adult: Secondary | ICD-10-CM | POA: Diagnosis not present

## 2016-12-20 DIAGNOSIS — R030 Elevated blood-pressure reading, without diagnosis of hypertension: Secondary | ICD-10-CM | POA: Diagnosis not present

## 2016-12-23 ENCOUNTER — Other Ambulatory Visit: Payer: Self-pay | Admitting: Nurse Practitioner

## 2016-12-23 DIAGNOSIS — M542 Cervicalgia: Secondary | ICD-10-CM

## 2016-12-23 DIAGNOSIS — M545 Low back pain: Secondary | ICD-10-CM

## 2016-12-26 DIAGNOSIS — E669 Obesity, unspecified: Secondary | ICD-10-CM | POA: Diagnosis not present

## 2016-12-26 DIAGNOSIS — R87612 Low grade squamous intraepithelial lesion on cytologic smear of cervix (LGSIL): Secondary | ICD-10-CM | POA: Diagnosis not present

## 2016-12-26 DIAGNOSIS — M064 Inflammatory polyarthropathy: Secondary | ICD-10-CM | POA: Diagnosis not present

## 2016-12-26 DIAGNOSIS — J4520 Mild intermittent asthma, uncomplicated: Secondary | ICD-10-CM | POA: Diagnosis not present

## 2016-12-26 DIAGNOSIS — Z0001 Encounter for general adult medical examination with abnormal findings: Secondary | ICD-10-CM | POA: Diagnosis not present

## 2016-12-26 DIAGNOSIS — Z124 Encounter for screening for malignant neoplasm of cervix: Secondary | ICD-10-CM | POA: Diagnosis not present

## 2016-12-26 DIAGNOSIS — J309 Allergic rhinitis, unspecified: Secondary | ICD-10-CM | POA: Diagnosis not present

## 2016-12-26 DIAGNOSIS — B372 Candidiasis of skin and nail: Secondary | ICD-10-CM | POA: Diagnosis not present

## 2016-12-26 DIAGNOSIS — L930 Discoid lupus erythematosus: Secondary | ICD-10-CM | POA: Diagnosis not present

## 2016-12-30 DIAGNOSIS — G5603 Carpal tunnel syndrome, bilateral upper limbs: Secondary | ICD-10-CM | POA: Diagnosis not present

## 2017-01-07 ENCOUNTER — Ambulatory Visit
Admission: RE | Admit: 2017-01-07 | Discharge: 2017-01-07 | Disposition: A | Discharge status: Home / Self Care | Payer: Medicare Other | Source: Ambulatory Visit | Attending: Nurse Practitioner | Admitting: Nurse Practitioner

## 2017-01-07 DIAGNOSIS — Z9889 Other specified postprocedural states: Secondary | ICD-10-CM | POA: Insufficient documentation

## 2017-01-07 DIAGNOSIS — M542 Cervicalgia: Secondary | ICD-10-CM | POA: Diagnosis not present

## 2017-01-07 DIAGNOSIS — M50223 Other cervical disc displacement at C6-C7 level: Secondary | ICD-10-CM | POA: Diagnosis not present

## 2017-01-07 DIAGNOSIS — M545 Low back pain: Secondary | ICD-10-CM | POA: Diagnosis not present

## 2017-01-07 DIAGNOSIS — M50222 Other cervical disc displacement at C5-C6 level: Secondary | ICD-10-CM | POA: Diagnosis not present

## 2017-01-09 DIAGNOSIS — L819 Disorder of pigmentation, unspecified: Secondary | ICD-10-CM | POA: Diagnosis not present

## 2017-01-09 DIAGNOSIS — L932 Other local lupus erythematosus: Secondary | ICD-10-CM | POA: Diagnosis not present

## 2017-01-09 DIAGNOSIS — R21 Rash and other nonspecific skin eruption: Secondary | ICD-10-CM | POA: Diagnosis not present

## 2017-01-09 DIAGNOSIS — M329 Systemic lupus erythematosus, unspecified: Secondary | ICD-10-CM | POA: Diagnosis not present

## 2017-01-09 DIAGNOSIS — K130 Diseases of lips: Secondary | ICD-10-CM | POA: Diagnosis not present

## 2017-01-13 DIAGNOSIS — G5603 Carpal tunnel syndrome, bilateral upper limbs: Secondary | ICD-10-CM | POA: Diagnosis not present

## 2017-01-14 DIAGNOSIS — M545 Low back pain: Secondary | ICD-10-CM | POA: Diagnosis not present

## 2017-01-14 DIAGNOSIS — M25512 Pain in left shoulder: Secondary | ICD-10-CM | POA: Diagnosis not present

## 2017-01-14 DIAGNOSIS — M79622 Pain in left upper arm: Secondary | ICD-10-CM | POA: Diagnosis not present

## 2017-01-14 DIAGNOSIS — G894 Chronic pain syndrome: Secondary | ICD-10-CM | POA: Diagnosis not present

## 2017-01-14 DIAGNOSIS — M79621 Pain in right upper arm: Secondary | ICD-10-CM | POA: Diagnosis not present

## 2017-01-14 DIAGNOSIS — M5412 Radiculopathy, cervical region: Secondary | ICD-10-CM | POA: Diagnosis not present

## 2017-01-14 DIAGNOSIS — M25561 Pain in right knee: Secondary | ICD-10-CM | POA: Diagnosis not present

## 2017-01-14 DIAGNOSIS — M25511 Pain in right shoulder: Secondary | ICD-10-CM | POA: Diagnosis not present

## 2017-01-14 DIAGNOSIS — Z79891 Long term (current) use of opiate analgesic: Secondary | ICD-10-CM | POA: Diagnosis not present

## 2017-02-11 DIAGNOSIS — Z79891 Long term (current) use of opiate analgesic: Secondary | ICD-10-CM | POA: Diagnosis not present

## 2017-02-11 DIAGNOSIS — M79621 Pain in right upper arm: Secondary | ICD-10-CM | POA: Diagnosis not present

## 2017-02-11 DIAGNOSIS — G894 Chronic pain syndrome: Secondary | ICD-10-CM | POA: Diagnosis not present

## 2017-02-11 DIAGNOSIS — M79662 Pain in left lower leg: Secondary | ICD-10-CM | POA: Diagnosis not present

## 2017-02-11 DIAGNOSIS — M79622 Pain in left upper arm: Secondary | ICD-10-CM | POA: Diagnosis not present

## 2017-02-11 DIAGNOSIS — M5412 Radiculopathy, cervical region: Secondary | ICD-10-CM | POA: Diagnosis not present

## 2017-02-18 DIAGNOSIS — Q0700 Arnold-Chiari syndrome without spina bifida or hydrocephalus: Secondary | ICD-10-CM | POA: Diagnosis not present

## 2017-02-18 DIAGNOSIS — L930 Discoid lupus erythematosus: Secondary | ICD-10-CM | POA: Diagnosis not present

## 2017-02-18 DIAGNOSIS — G5601 Carpal tunnel syndrome, right upper limb: Secondary | ICD-10-CM | POA: Diagnosis not present

## 2017-02-25 DIAGNOSIS — G5601 Carpal tunnel syndrome, right upper limb: Secondary | ICD-10-CM | POA: Diagnosis not present

## 2017-02-25 DIAGNOSIS — B370 Candidal stomatitis: Secondary | ICD-10-CM | POA: Diagnosis not present

## 2017-02-25 DIAGNOSIS — I10 Essential (primary) hypertension: Secondary | ICD-10-CM | POA: Diagnosis not present

## 2017-02-25 DIAGNOSIS — E669 Obesity, unspecified: Secondary | ICD-10-CM | POA: Diagnosis not present

## 2017-03-06 ENCOUNTER — Encounter
Admission: RE | Admit: 2017-03-06 | Discharge: 2017-03-06 | Disposition: A | Discharge status: Home / Self Care | Payer: Medicare Other | Source: Ambulatory Visit | Attending: Orthopedic Surgery | Admitting: Orthopedic Surgery

## 2017-03-06 DIAGNOSIS — Z01812 Encounter for preprocedural laboratory examination: Secondary | ICD-10-CM | POA: Insufficient documentation

## 2017-03-06 HISTORY — DX: Arthritis of unspecified temporomandibular joint: M26.649

## 2017-03-06 HISTORY — DX: Other specified disorders of temporomandibular joint: M26.69

## 2017-03-06 LAB — BASIC METABOLIC PANEL
Anion gap: 8 (ref 5–15)
BUN: 11 mg/dL (ref 6–20)
CALCIUM: 9.5 mg/dL (ref 8.9–10.3)
CO2: 30 mmol/L (ref 22–32)
CREATININE: 0.83 mg/dL (ref 0.44–1.00)
Chloride: 99 mmol/L — ABNORMAL LOW (ref 101–111)
GFR calc Af Amer: >60 mL/min (ref >60)
GLUCOSE: 99 mg/dL (ref 65–99)
Potassium: 4 mmol/L (ref 3.5–5.1)
SODIUM: 137 mmol/L (ref 135–145)

## 2017-03-06 NOTE — Patient Instructions (Signed)
  Your procedure is scheduled on: Thurs. 03/13/17 Report to Day Surgery. To find out your arrival time please call [phone] between 1PM - 3PM on Wed. 03/12/17.  Remember: Instructions that are not followed completely may result in serious medical risk, up to and including death, or upon the discretion of your surgeon and anesthesiologist your surgery may need to be rescheduled.    _x___ 1. Do not eat food or drink liquids after midnight. No gum chewing or hard candies.     ___x_ 2. No Alcohol for 24 hours before or after surgery.   ____ 3. Do Not Smoke For 24 Hours Prior to Your Surgery.   ____ 4. Bring all medications with you on the day of surgery if instructed.    _x___ 5. Notify your doctor if there is any change in your medical condition     (cold, fever, infections).       Do not wear jewelry, make-up, hairpins, clips or nail polish.  Do not wear lotions, powders, or perfumes. You may wear deodorant.  Do not shave 48 hours prior to surgery. Men may shave face and neck.  Do not bring valuables to the hospital.    [facility] is not responsible for any belongings or valuables.               Contacts, dentures or bridgework may not be worn into surgery.  Leave your suitcase in the car. After surgery it may be brought to your room.  For patients admitted to the hospital, discharge time is determined by your                treatment team.   Patients discharged the day of surgery will not be allowed to drive home.   Please read over the following fact sheets that you were given:      _x___ Take these medicines the morning of surgery with A SIP OF WATER:    1. albuterol (PROVENTIL HFA;VENTOLIN HFA) 108 (90 Base) MCG/ACT inhaler  2. carvedilol (COREG) 25 MG tablet  3. gabapentin (NEURONTIN) 300 MG capsule  4.hydroxychloroquine (PLAQUENIL) 200 MG tablet  5.oxyCODONE-acetaminophen (PERCOCET/ROXICET) 5-325 MG per tablet  6.PULMICORT FLEXHALER 90 MCG/ACT inhaler              7.  tiZANidine (ZANAFLEX) 4 MG tablet if needed  ____ Fleet Enema (as directed)   ___x_ Use CHG Soap as directed  __x__ Use inhalers on the day of surgery  ____ Stop metformin 2 days prior to surgery    ____ Take 1/2 of usual insulin dose the night before surgery and none on the morning of surgery.   ____ Stop Coumadin/Plavix/aspirin on  _x___ Stop Anti-inflammatories on diclofenac sodium (VOLTAREN) 1 % GEL ,meloxicam (MOBIC) 7.5 MG tablet/ no ibuprofen or aleve (tylenol up to 4000mg  in 24 hours)   ____ Stop supplements until after surgery.    ____ Bring C-Pap to the hospital.

## 2017-03-11 DIAGNOSIS — J4520 Mild intermittent asthma, uncomplicated: Secondary | ICD-10-CM | POA: Diagnosis not present

## 2017-03-11 DIAGNOSIS — J45991 Cough variant asthma: Secondary | ICD-10-CM | POA: Diagnosis not present

## 2017-03-11 DIAGNOSIS — J309 Allergic rhinitis, unspecified: Secondary | ICD-10-CM | POA: Diagnosis not present

## 2017-03-12 DIAGNOSIS — M542 Cervicalgia: Secondary | ICD-10-CM | POA: Diagnosis not present

## 2017-03-12 DIAGNOSIS — M545 Low back pain: Secondary | ICD-10-CM | POA: Diagnosis not present

## 2017-03-12 DIAGNOSIS — M25561 Pain in right knee: Secondary | ICD-10-CM | POA: Diagnosis not present

## 2017-03-12 DIAGNOSIS — M25511 Pain in right shoulder: Secondary | ICD-10-CM | POA: Diagnosis not present

## 2017-03-12 DIAGNOSIS — M25562 Pain in left knee: Secondary | ICD-10-CM | POA: Diagnosis not present

## 2017-03-12 DIAGNOSIS — M25512 Pain in left shoulder: Secondary | ICD-10-CM | POA: Diagnosis not present

## 2017-03-12 DIAGNOSIS — G501 Atypical facial pain: Secondary | ICD-10-CM | POA: Diagnosis not present

## 2017-03-12 DIAGNOSIS — G894 Chronic pain syndrome: Secondary | ICD-10-CM | POA: Diagnosis not present

## 2017-03-27 ENCOUNTER — Encounter: Admission: RE | Payer: Self-pay | Source: Ambulatory Visit

## 2017-03-27 ENCOUNTER — Ambulatory Visit: Admission: RE | Admit: 2017-03-27 | Payer: Medicare Other | Source: Ambulatory Visit | Admitting: Orthopedic Surgery

## 2017-03-27 SURGERY — CARPAL TUNNEL RELEASE
Anesthesia: Choice | Laterality: Right

## 2017-04-03 DIAGNOSIS — Z79899 Other long term (current) drug therapy: Secondary | ICD-10-CM | POA: Diagnosis not present

## 2017-04-03 DIAGNOSIS — M329 Systemic lupus erythematosus, unspecified: Secondary | ICD-10-CM | POA: Diagnosis not present

## 2017-04-10 DIAGNOSIS — M5412 Radiculopathy, cervical region: Secondary | ICD-10-CM | POA: Diagnosis not present

## 2017-04-10 DIAGNOSIS — M79621 Pain in right upper arm: Secondary | ICD-10-CM | POA: Diagnosis not present

## 2017-04-10 DIAGNOSIS — M79622 Pain in left upper arm: Secondary | ICD-10-CM | POA: Diagnosis not present

## 2017-04-10 DIAGNOSIS — G894 Chronic pain syndrome: Secondary | ICD-10-CM | POA: Diagnosis not present

## 2017-04-10 DIAGNOSIS — M79662 Pain in left lower leg: Secondary | ICD-10-CM | POA: Diagnosis not present

## 2017-04-28 DIAGNOSIS — L930 Discoid lupus erythematosus: Secondary | ICD-10-CM | POA: Diagnosis not present

## 2017-04-28 DIAGNOSIS — I10 Essential (primary) hypertension: Secondary | ICD-10-CM | POA: Diagnosis not present

## 2017-04-28 DIAGNOSIS — J309 Allergic rhinitis, unspecified: Secondary | ICD-10-CM | POA: Diagnosis not present

## 2017-04-28 DIAGNOSIS — E669 Obesity, unspecified: Secondary | ICD-10-CM | POA: Diagnosis not present

## 2017-04-28 DIAGNOSIS — J0191 Acute recurrent sinusitis, unspecified: Secondary | ICD-10-CM | POA: Diagnosis not present

## 2017-04-30 DIAGNOSIS — G5603 Carpal tunnel syndrome, bilateral upper limbs: Secondary | ICD-10-CM | POA: Diagnosis not present

## 2017-04-30 DIAGNOSIS — G43009 Migraine without aura, not intractable, without status migrainosus: Secondary | ICD-10-CM | POA: Diagnosis not present

## 2017-05-08 DIAGNOSIS — M79621 Pain in right upper arm: Secondary | ICD-10-CM | POA: Diagnosis not present

## 2017-05-08 DIAGNOSIS — M5412 Radiculopathy, cervical region: Secondary | ICD-10-CM | POA: Diagnosis not present

## 2017-05-08 DIAGNOSIS — M79622 Pain in left upper arm: Secondary | ICD-10-CM | POA: Diagnosis not present

## 2017-05-08 DIAGNOSIS — M79662 Pain in left lower leg: Secondary | ICD-10-CM | POA: Diagnosis not present

## 2017-06-10 DIAGNOSIS — M79621 Pain in right upper arm: Secondary | ICD-10-CM | POA: Diagnosis not present

## 2017-06-10 DIAGNOSIS — M79662 Pain in left lower leg: Secondary | ICD-10-CM | POA: Diagnosis not present

## 2017-06-10 DIAGNOSIS — G894 Chronic pain syndrome: Secondary | ICD-10-CM | POA: Diagnosis not present

## 2017-06-10 DIAGNOSIS — M5412 Radiculopathy, cervical region: Secondary | ICD-10-CM | POA: Diagnosis not present

## 2017-06-10 DIAGNOSIS — M79622 Pain in left upper arm: Secondary | ICD-10-CM | POA: Diagnosis not present

## 2017-06-11 DIAGNOSIS — R030 Elevated blood-pressure reading, without diagnosis of hypertension: Secondary | ICD-10-CM | POA: Diagnosis not present

## 2017-06-11 DIAGNOSIS — Z6837 Body mass index (BMI) 37.0-37.9, adult: Secondary | ICD-10-CM | POA: Diagnosis not present

## 2017-06-11 DIAGNOSIS — M542 Cervicalgia: Secondary | ICD-10-CM | POA: Diagnosis not present

## 2017-06-11 DIAGNOSIS — G935 Compression of brain: Secondary | ICD-10-CM | POA: Diagnosis not present

## 2017-07-03 DIAGNOSIS — J4520 Mild intermittent asthma, uncomplicated: Secondary | ICD-10-CM | POA: Diagnosis not present

## 2017-07-03 DIAGNOSIS — J309 Allergic rhinitis, unspecified: Secondary | ICD-10-CM | POA: Diagnosis not present

## 2017-07-03 DIAGNOSIS — E669 Obesity, unspecified: Secondary | ICD-10-CM | POA: Diagnosis not present

## 2017-07-03 DIAGNOSIS — J0191 Acute recurrent sinusitis, unspecified: Secondary | ICD-10-CM | POA: Diagnosis not present

## 2017-07-03 DIAGNOSIS — L930 Discoid lupus erythematosus: Secondary | ICD-10-CM | POA: Diagnosis not present

## 2017-07-11 DIAGNOSIS — M542 Cervicalgia: Secondary | ICD-10-CM | POA: Diagnosis not present

## 2017-07-11 DIAGNOSIS — M545 Low back pain: Secondary | ICD-10-CM | POA: Diagnosis not present

## 2017-07-11 DIAGNOSIS — M25562 Pain in left knee: Secondary | ICD-10-CM | POA: Diagnosis not present

## 2017-07-11 DIAGNOSIS — M25511 Pain in right shoulder: Secondary | ICD-10-CM | POA: Diagnosis not present

## 2017-07-11 DIAGNOSIS — M25561 Pain in right knee: Secondary | ICD-10-CM | POA: Diagnosis not present

## 2017-07-11 DIAGNOSIS — G894 Chronic pain syndrome: Secondary | ICD-10-CM | POA: Diagnosis not present

## 2017-07-11 DIAGNOSIS — G501 Atypical facial pain: Secondary | ICD-10-CM | POA: Diagnosis not present

## 2017-07-11 DIAGNOSIS — M25512 Pain in left shoulder: Secondary | ICD-10-CM | POA: Diagnosis not present

## 2017-07-17 DIAGNOSIS — M328 Other forms of systemic lupus erythematosus: Secondary | ICD-10-CM | POA: Diagnosis not present

## 2017-07-17 DIAGNOSIS — L738 Other specified follicular disorders: Secondary | ICD-10-CM | POA: Diagnosis not present

## 2017-07-17 DIAGNOSIS — L700 Acne vulgaris: Secondary | ICD-10-CM | POA: Diagnosis not present

## 2017-07-30 ENCOUNTER — Other Ambulatory Visit: Payer: Self-pay | Admitting: Internal Medicine

## 2017-07-30 DIAGNOSIS — H9209 Otalgia, unspecified ear: Secondary | ICD-10-CM | POA: Diagnosis not present

## 2017-07-30 DIAGNOSIS — J0181 Other acute recurrent sinusitis: Secondary | ICD-10-CM

## 2017-07-30 DIAGNOSIS — J0191 Acute recurrent sinusitis, unspecified: Secondary | ICD-10-CM | POA: Diagnosis not present

## 2017-07-31 ENCOUNTER — Ambulatory Visit: Payer: Medicare Other

## 2017-08-05 ENCOUNTER — Ambulatory Visit
Admission: RE | Admit: 2017-08-05 | Discharge: 2017-08-05 | Disposition: A | Discharge status: Home / Self Care | Payer: Medicare Other | Source: Ambulatory Visit | Attending: Internal Medicine | Admitting: Internal Medicine

## 2017-08-05 DIAGNOSIS — J0181 Other acute recurrent sinusitis: Secondary | ICD-10-CM | POA: Diagnosis not present

## 2017-08-05 DIAGNOSIS — R51 Headache: Secondary | ICD-10-CM | POA: Diagnosis not present

## 2017-08-08 DIAGNOSIS — M545 Low back pain: Secondary | ICD-10-CM | POA: Diagnosis not present

## 2017-08-08 DIAGNOSIS — G501 Atypical facial pain: Secondary | ICD-10-CM | POA: Diagnosis not present

## 2017-08-08 DIAGNOSIS — G894 Chronic pain syndrome: Secondary | ICD-10-CM | POA: Diagnosis not present

## 2017-08-08 DIAGNOSIS — M5412 Radiculopathy, cervical region: Secondary | ICD-10-CM | POA: Diagnosis not present

## 2017-08-08 DIAGNOSIS — M25561 Pain in right knee: Secondary | ICD-10-CM | POA: Diagnosis not present

## 2017-08-08 DIAGNOSIS — M25511 Pain in right shoulder: Secondary | ICD-10-CM | POA: Diagnosis not present

## 2017-08-08 DIAGNOSIS — M25512 Pain in left shoulder: Secondary | ICD-10-CM | POA: Diagnosis not present

## 2017-08-08 DIAGNOSIS — M79622 Pain in left upper arm: Secondary | ICD-10-CM | POA: Diagnosis not present

## 2017-08-21 NOTE — Progress Notes (Signed)
Cardiology Office Note  Date:  08/22/2017   ID:  Morgan Kent, DOB [birth year], MRN [id number]  PCP:  Ronnell Freshwater, NP   Chief Complaint  Patient presents with  . other    12 month follow up. Meds reviewed by the pt. verbally. Pt. c/o a sinus infection and has an appointment with ENT next week.  Denies chest pain or shortness of breath.     HPI:  Morgan Kent is a pleasant 36 year old woman with  morbid obesity, lupus,  neck surgery With chronic pain down her arms bilaterally,  sleep apnea on nasal CPAP,   EF 60 to 65% in 11/2015, up from 45% in 2016 who presents for follow-up of her symptoms of shortness of breath, cardiomyopathy  She reports feeling well except for severe sinus pressure worse on the right than the left Reports that she has tried several rounds of antibiotics to primary care, prednisone Still with severe pain right side of her face, pressure right ear, right cheek Scheduled to see ear nose throat neck week  Denies any shortness breath or chest pain No leg swelling, no PND or orthopnea  Still has chronic pain, followed by the pain clinic in [place] Taking Neurontin for years  Previously on phentermine for weight loss weight down >20 pounds last year, down another 6 pounds in the past year  EKG on today's visit shows normal sinus rhythm with rate 88 bpm, no significant ST or T-wave changes   PMH:   has a past medical history of Anxiety; Arthritis; Chiari malformation; Depression; Dizziness; Headache(784.0); History of kidney stones; Hypertension; Lupus; Lupus; Nerve damage; PONV (postoperative nausea and vomiting); and TMJ arthritis.  PSH:    Past Surgical History:  Procedure Laterality Date  . carpel tunnel  Left 2014  . CRANIOTOMY  14,06   chiari  . DENTAL SURGERY    . HERNIA REPAIR     umbilical  . PLACEMENT OF LUMBAR DRAIN N/A 07/07/2014   Procedure: PLACEMENT OF LUMBAR DRAIN AND CLOSURE OF CERVICAL INCISION;  Surgeon: Newman Pies, MD;   Location: [facility];  Service: Neurosurgery;  Laterality: N/A;  . SUBOCCIPITAL CRANIECTOMY CERVICAL LAMINECTOMY N/A 09/29/2013   Procedure: SUBOCCIPITAL CRANIECTOMY CERVICAL LAMINECTOMY/DURAPLASTY;  Surgeon: Ophelia Charter, MD;  Location: [facility];  Service: Neurosurgery;  Laterality: N/A;  posterior  . SUBOCCIPITAL CRANIECTOMY CERVICAL LAMINECTOMY N/A 06/23/2014   Procedure: SUBOCCIPITAL CRANIECTOMY CERVICAL LAMINECTOMY/DURAPLASTY;  Surgeon: Newman Pies, MD;  Location: [facility];  Service: Neurosurgery;  Laterality: N/A;  suboccipital craniectomy with cervical laminectomy and duraplasty    Current Outpatient Prescriptions  Medication Sig Dispense Refill  . adapalene (DIFFERIN) 0.1 % gel Apply 1 application topically at bedtime. Apply to forehead and chin    . albuterol (PROVENTIL HFA;VENTOLIN HFA) 108 (90 Base) MCG/ACT inhaler Inhale 2 puffs into the lungs daily.     Marland Kitchen antiseptic oral rinse (BIOTENE) LIQD 15 mLs by Mouth Rinse route 3 (three) times daily.     . carvedilol (COREG) 25 MG tablet take 1 tablet by mouth twice a day 180 tablet 3  . cetirizine (ZYRTEC) 10 MG tablet Take 10 mg by mouth at bedtime.     . clindamycin (CLEOCIN T) 1 % lotion Apply 1 application topically 2 (two) times daily.   0  . clobetasol (TEMOVATE) 0.05 % external solution Apply 1 application topically 2 (two) times daily as needed. Apply to scalp    . diclofenac sodium (VOLTAREN) 1 % GEL Apply 1 application topically  2 (two) times daily as needed (for pain).     . fluticasone (FLONASE) 50 MCG/ACT nasal spray Place 1 spray into both nostrils at bedtime.     . gabapentin (NEURONTIN) 300 MG capsule Take 300 mg by mouth 3 (three) times daily.     . hydrochlorothiazide (HYDRODIURIL) 25 MG tablet take 1 tablet by mouth once daily 90 tablet 3  . hydroxychloroquine (PLAQUENIL) 200 MG tablet Take 200 mg by mouth daily.     Marland Kitchen KETOCONAZOLE, TOPICAL, 1 % SHAM Apply 1 application topically every other day.     .  meloxicam (MOBIC) 7.5 MG tablet Take 7.5 mg by mouth 2 (two) times daily.     . mometasone (ELOCON) 0.1 % ointment Apply 1 application topically 2 (two) times daily as needed (lupus outbreaks).    . montelukast (SINGULAIR) 10 MG tablet Take 10 mg by mouth at bedtime.    . norethindrone (MICRONOR,CAMILA,ERRIN) 0.35 MG tablet Take 1 tablet by mouth daily.     Marland Kitchen nystatin (MYCOSTATIN) 100000 UNIT/ML suspension Take 5 mLs by mouth 4 (four) times daily. 10 days dose started on 02-25-17    . oxyCODONE-acetaminophen (PERCOCET/ROXICET) 5-325 MG per tablet Take 1 tablet by mouth every 6 (six) hours as needed for moderate pain.     Marland Kitchen Phendimetrazine Tartrate 105 MG CP24 Take 105 mg by mouth daily.    Vladimir Faster Glycol-Propyl Glycol (SYSTANE ULTRA) 0.4-0.3 % SOLN Place 1 drop into both eyes daily as needed (for dry eyes).     Marland Kitchen PULMICORT FLEXHALER 90 MCG/ACT inhaler Inhale 2 puffs into the lungs daily.   0  . tacrolimus (PROTOPIC) 0.1 % ointment Apply 1 application topically daily.    Marland Kitchen tiZANidine (ZANAFLEX) 4 MG tablet Take 4 mg by mouth every 8 (eight) hours as needed for muscle spasms.    Marland Kitchen tretinoin (RETIN-A) 0.05 % cream Apply 1 application topically at bedtime.   0  . triamcinolone cream (KENALOG) 0.1 % Apply 1 application topically 2 (two) times daily as needed (outbreaks).   0  . Vitamin D, Ergocalciferol, (DRISDOL) 50000 units CAPS capsule Take 50,000 Units by mouth every 7 (seven) days. On wednesdays  0   No current facility-administered medications for this visit.      Allergies:   Cephalexin   Social History:  The patient  reports that she has never smoked. She has never used smokeless tobacco. She reports that she does not drink alcohol or use drugs.   Family History:   family history includes Asthma in her unknown relative; Depression in her unknown relative; Diabetes in her unknown relative; Heart disease in her father; Hypertension in her father and mother; Migraines in her unknown  relative; Stroke in her unknown relative; Thyroid disease in her mother.    Review of Systems: Review of Systems  Constitutional: Negative.   HENT: Positive for congestion, ear pain and sinus pain.   Respiratory: Negative.   Cardiovascular: Negative.   Gastrointestinal: Negative.   Musculoskeletal: Negative.        Arm pain, back pain, shoulder pain, neck pain  Neurological: Negative.   Psychiatric/Behavioral: Negative.   All other systems reviewed and are negative.    PHYSICAL EXAM: VS:  BP 112/70 (BP Location: Left Arm, Patient Position: Sitting, Cuff Size: Large)   Pulse 88   Ht 5\' 6"  (1.676 m)   Wt 236 lb 4 oz (107.2 kg)   BMI 38.13 kg/m  , BMI Body mass index is 38.13 kg/m.  No  change in exam compared to previous office visit GEN: Well nourished, well developed, in no acute distress, obese  HEENT: normal  Neck: no JVD, carotid bruits, or masses Cardiac: RRR; no murmurs, rubs, or gallops,no edema  Respiratory:  clear to auscultation bilaterally, normal work of breathing GI: soft, nontender, nondistended, + BS MS: no deformity or atrophy  Skin: warm and dry, no rash Neuro:  Strength and sensation are intact Psych: euthymic mood, full affect    Recent Labs: 03/06/2017: BUN 11; Creatinine, Ser 0.83; Potassium 4.0; Sodium 137    Lipid Panel No results found for: CHOL, HDL, LDLCALC, TRIG    Wt Readings from Last 3 Encounters:  08/22/17 236 lb 4 oz (107.2 kg)  08/20/16 242 lb 12 oz (110.1 kg)  11/22/15 263 lb 4 oz (119.4 kg)       ASSESSMENT AND PLAN:   Morbid obesity (HCC) Weight down 6 pounds, encouraged her to watch her carbohydrates Started walking program  Essential hypertension Recommended she start lisinopril 5 mg daily given prior history of cardiomyopathy, blood pressure stable  Obstructive sleep apnea on CPAP "they came and got machine" PMD did a test, "does not need it" Continues to have some issues with her sleep  Shortness of  breath Will add lisinopril as above, symptoms have been stable Recommended regular exercise program  Tachycardia Symptoms stable on carvedilol,  stable heart rate  Chronic pain, arms Chronic neck, shoulder, back, arm pain  on Neurontin,  seen by the pain clinic   Total encounter time more than 25 minutes  Greater than 50% was spent in counseling and coordination of care with the patient   Disposition:   F/U  12 months   No orders of the defined types were placed in this encounter.    Signed, Esmond Plants, M.D., Ph.D. 08/22/2017  [place], [place]

## 2017-08-22 ENCOUNTER — Encounter: Payer: Self-pay | Admitting: Cardiovascular Disease

## 2017-08-22 ENCOUNTER — Ambulatory Visit (INDEPENDENT_AMBULATORY_CARE_PROVIDER_SITE_OTHER): Payer: Medicare Other | Admitting: Cardiovascular Disease

## 2017-08-22 VITALS — BP 112/70 | HR 88 | Ht 66.0 in | Wt 236.2 lb

## 2017-08-22 DIAGNOSIS — L930 Discoid lupus erythematosus: Secondary | ICD-10-CM

## 2017-08-22 DIAGNOSIS — I5022 Chronic systolic (congestive) heart failure: Secondary | ICD-10-CM

## 2017-08-22 MED ORDER — LISINOPRIL 5 MG PO TABS: 5.0000 mg | ORAL_TABLET | Freq: Every day | ORAL | 3 refills | Status: DC

## 2017-08-22 NOTE — Patient Instructions (Signed)
Medication Instructions:   Please start lisinopril 5 mg daily  Labwork:  No new labs needed  Testing/Procedures:  No further testing at this time   Follow-Up: It was a pleasure seeing you in the office today. Please call us if you have new issues that need to be addressed before your next appt.  [phone]  Your physician wants you to follow-up in: 12 months.  You will receive a reminder letter in the mail two months in advance. If you don't receive a letter, please call our office to schedule the follow-up appointment.  If you need a refill on your cardiac medications before your next appointment, please call your pharmacy.

## 2017-08-26 ENCOUNTER — Other Ambulatory Visit: Payer: Self-pay | Admitting: Otolaryngology

## 2017-08-26 DIAGNOSIS — M542 Cervicalgia: Secondary | ICD-10-CM

## 2017-08-26 DIAGNOSIS — G500 Trigeminal neuralgia: Secondary | ICD-10-CM | POA: Diagnosis not present

## 2017-08-26 DIAGNOSIS — H9201 Otalgia, right ear: Secondary | ICD-10-CM

## 2017-08-31 ENCOUNTER — Other Ambulatory Visit: Payer: Self-pay | Admitting: Cardiovascular Disease

## 2017-09-05 ENCOUNTER — Ambulatory Visit
Admission: RE | Admit: 2017-09-05 | Discharge: 2017-09-05 | Disposition: A | Discharge status: Home / Self Care | Payer: Medicare Other | Source: Ambulatory Visit | Attending: Otolaryngology | Admitting: Otolaryngology

## 2017-09-05 DIAGNOSIS — H9201 Otalgia, right ear: Secondary | ICD-10-CM | POA: Insufficient documentation

## 2017-09-05 DIAGNOSIS — M4802 Spinal stenosis, cervical region: Secondary | ICD-10-CM | POA: Diagnosis not present

## 2017-09-05 DIAGNOSIS — G500 Trigeminal neuralgia: Secondary | ICD-10-CM

## 2017-09-05 DIAGNOSIS — G9619 Other disorders of meninges, not elsewhere classified: Secondary | ICD-10-CM | POA: Diagnosis not present

## 2017-09-05 DIAGNOSIS — M542 Cervicalgia: Secondary | ICD-10-CM | POA: Diagnosis not present

## 2017-09-05 LAB — POCT I-STAT CREATININE: Creatinine, Ser: 1 mg/dL (ref 0.44–1.00)

## 2017-09-05 MED ORDER — GADOBENATE DIMEGLUMINE 529 MG/ML IV SOLN
20.0000 mL | Freq: Once | INTRAVENOUS | Status: AC | PRN
  Administered 2017-09-05: 20 mL via INTRAVENOUS

## 2017-09-10 DIAGNOSIS — R21 Rash and other nonspecific skin eruption: Secondary | ICD-10-CM | POA: Diagnosis not present

## 2017-09-10 DIAGNOSIS — M328 Other forms of systemic lupus erythematosus: Secondary | ICD-10-CM | POA: Diagnosis not present

## 2017-09-10 DIAGNOSIS — L932 Other local lupus erythematosus: Secondary | ICD-10-CM | POA: Diagnosis not present

## 2017-09-10 DIAGNOSIS — L259 Unspecified contact dermatitis, unspecified cause: Secondary | ICD-10-CM | POA: Diagnosis not present

## 2017-09-10 DIAGNOSIS — L700 Acne vulgaris: Secondary | ICD-10-CM | POA: Diagnosis not present

## 2017-09-19 DIAGNOSIS — G500 Trigeminal neuralgia: Secondary | ICD-10-CM | POA: Diagnosis not present

## 2017-09-29 DIAGNOSIS — J4520 Mild intermittent asthma, uncomplicated: Secondary | ICD-10-CM | POA: Diagnosis not present

## 2017-09-29 DIAGNOSIS — J309 Allergic rhinitis, unspecified: Secondary | ICD-10-CM | POA: Diagnosis not present

## 2017-09-29 DIAGNOSIS — J0191 Acute recurrent sinusitis, unspecified: Secondary | ICD-10-CM | POA: Diagnosis not present

## 2017-10-07 DIAGNOSIS — M25511 Pain in right shoulder: Secondary | ICD-10-CM | POA: Diagnosis not present

## 2017-10-07 DIAGNOSIS — M542 Cervicalgia: Secondary | ICD-10-CM | POA: Diagnosis not present

## 2017-10-07 DIAGNOSIS — G894 Chronic pain syndrome: Secondary | ICD-10-CM | POA: Diagnosis not present

## 2017-10-07 DIAGNOSIS — G501 Atypical facial pain: Secondary | ICD-10-CM | POA: Diagnosis not present

## 2017-10-07 DIAGNOSIS — M25512 Pain in left shoulder: Secondary | ICD-10-CM | POA: Diagnosis not present

## 2017-10-07 DIAGNOSIS — M545 Low back pain: Secondary | ICD-10-CM | POA: Diagnosis not present

## 2017-11-05 ENCOUNTER — Other Ambulatory Visit: Payer: Self-pay | Admitting: Nurse Practitioner

## 2017-11-05 DIAGNOSIS — J0191 Acute recurrent sinusitis, unspecified: Secondary | ICD-10-CM

## 2017-11-05 MED ORDER — CLARITHROMYCIN 500 MG PO TABS: 500.0000 mg | ORAL_TABLET | Freq: Two times a day (BID) | ORAL | 0 refills | Status: DC

## 2017-11-05 NOTE — Progress Notes (Signed)
Sent in biaxin 500mg  bid for 10 days to [facility] [address] for sinusitis

## 2017-11-10 DIAGNOSIS — M79642 Pain in left hand: Secondary | ICD-10-CM | POA: Diagnosis not present

## 2017-11-10 DIAGNOSIS — G501 Atypical facial pain: Secondary | ICD-10-CM | POA: Diagnosis not present

## 2017-11-10 DIAGNOSIS — M542 Cervicalgia: Secondary | ICD-10-CM | POA: Diagnosis not present

## 2017-11-10 DIAGNOSIS — M25562 Pain in left knee: Secondary | ICD-10-CM | POA: Diagnosis not present

## 2017-11-10 DIAGNOSIS — M25511 Pain in right shoulder: Secondary | ICD-10-CM | POA: Diagnosis not present

## 2017-11-10 DIAGNOSIS — M25512 Pain in left shoulder: Secondary | ICD-10-CM | POA: Diagnosis not present

## 2017-11-10 DIAGNOSIS — M545 Low back pain: Secondary | ICD-10-CM | POA: Diagnosis not present

## 2017-11-10 DIAGNOSIS — M79641 Pain in right hand: Secondary | ICD-10-CM | POA: Diagnosis not present

## 2017-11-10 DIAGNOSIS — M25561 Pain in right knee: Secondary | ICD-10-CM | POA: Diagnosis not present

## 2017-11-10 DIAGNOSIS — G894 Chronic pain syndrome: Secondary | ICD-10-CM | POA: Diagnosis not present

## 2017-11-17 ENCOUNTER — Other Ambulatory Visit: Payer: Self-pay | Admitting: Cardiovascular Disease

## 2017-11-18 ENCOUNTER — Other Ambulatory Visit: Payer: Self-pay

## 2017-11-18 MED ORDER — MELOXICAM 7.5 MG PO TABS: 7.5000 mg | ORAL_TABLET | Freq: Two times a day (BID) | ORAL | 0 refills | Status: DC

## 2017-12-05 ENCOUNTER — Ambulatory Visit (INDEPENDENT_AMBULATORY_CARE_PROVIDER_SITE_OTHER): Payer: Medicare Other | Admitting: Nurse Practitioner

## 2017-12-05 ENCOUNTER — Encounter: Payer: Self-pay | Admitting: Nurse Practitioner

## 2017-12-05 VITALS — BP 112/79 | HR 93 | Resp 16 | Ht 66.0 in | Wt 238.4 lb

## 2017-12-05 DIAGNOSIS — J0191 Acute recurrent sinusitis, unspecified: Secondary | ICD-10-CM

## 2017-12-05 DIAGNOSIS — J309 Allergic rhinitis, unspecified: Secondary | ICD-10-CM | POA: Diagnosis not present

## 2017-12-05 DIAGNOSIS — I10 Essential (primary) hypertension: Secondary | ICD-10-CM | POA: Diagnosis not present

## 2017-12-05 MED ORDER — PHENTERMINE HCL 37.5 MG PO TABS: 37.5000 mg | ORAL_TABLET | Freq: Every day | ORAL | 1 refills | Status: DC

## 2017-12-05 MED ORDER — METHYLPREDNISOLONE 4 MG PO TABS: ORAL_TABLET | ORAL | 0 refills | Status: DC

## 2017-12-05 MED ORDER — CLARITHROMYCIN 500 MG PO TABS: 500.0000 mg | ORAL_TABLET | Freq: Two times a day (BID) | ORAL | 0 refills | Status: DC

## 2017-12-05 NOTE — Progress Notes (Addendum)
[facility], [place] [address]  Internal MEDICINE  Office Visit Note  Patient Name: Morgan Kent  [id number]  [id number]  Date of Service: 12/05/2017  Chief Complaint  Patient presents with  . Nasal Congestion    runny nose as well  . Cough    dry cough mainly at night  . Sore Throat    throat does hurt    The patient is here for routine follow up exam. She has been having intermittent sinus and upper respiratory infections. She has been treated with antibiotics and oral steroids. She does get better while on antibiotics, then symptoms come right back. She is taking all of her allergy medications including inhalers and nasal sprays.  She is also here for follow up of weight management. Was on phentermine to help suppress appetite. Has been of for a few months due to intermittent illness. Despite this, she hs lost 5 pounds. Is closely watching her diet. She has increased her water intake, and is trying to be more active .   Cough  This is a recurrent problem. The current episode started 1 to 4 weeks ago. The problem has been unchanged. The problem occurs constantly. The cough is productive of sputum. Associated symptoms include ear congestion, ear pain, headaches, myalgias, nasal congestion, postnasal drip, a rash, rhinorrhea, a sore throat and wheezing. Pertinent negatives include no chest pain, chills or fever. The symptoms are aggravated by exercise. She has tried OTC cough suppressant, prescription cough suppressant, oral steroids and leukotriene antagonists (antibiotics ) for the symptoms. The treatment provided moderate relief. Her past medical history is significant for asthma and environmental allergies.  Sore Throat   This is a recurrent problem. The current episode started 1 to 4 weeks ago. The problem has been unchanged. Neither side of throat is experiencing more pain than the other. There has been no fever. The pain is at a severity of 6/10.  The pain is moderate. Associated symptoms include congestion, coughing, ear pain, headaches and a hoarse voice. Pertinent negatives include no diarrhea or vomiting. She has tried gargles and acetaminophen (antibiotics) for the symptoms. The treatment provided mild relief.    Pt is here for routine follow up.    Current Medication: Outpatient Encounter Medications as of 12/05/2017  Medication Sig  . adapalene (DIFFERIN) 0.1 % gel Apply 1 application topically at bedtime. Apply to forehead and chin  . albuterol (PROVENTIL HFA;VENTOLIN HFA) 108 (90 Base) MCG/ACT inhaler Inhale 2 puffs into the lungs daily.   Marland Kitchen antiseptic oral rinse (BIOTENE) LIQD 15 mLs by Mouth Rinse route 3 (three) times daily.   . carvedilol (COREG) 25 MG tablet take 1 tablet by mouth twice a day  . cetirizine (ZYRTEC) 10 MG tablet Take 10 mg by mouth at bedtime.   . clarithromycin (BIAXIN) 500 MG tablet Take 1 tablet (500 mg total) by mouth 2 (two) times daily.  . clindamycin (CLEOCIN T) 1 % lotion Apply 1 application topically 2 (two) times daily.   . clobetasol (TEMOVATE) 0.05 % external solution Apply 1 application topically 2 (two) times daily as needed. Apply to scalp  . diclofenac sodium (VOLTAREN) 1 % GEL Apply 1 application topically 2 (two) times daily as needed (for pain).   . fluticasone (FLONASE) 50 MCG/ACT nasal spray Place 1 spray into both nostrils at bedtime.   . gabapentin (NEURONTIN) 300 MG capsule Take 300 mg by mouth 3 (three) times daily.   . hydrochlorothiazide (HYDRODIURIL) 25 MG  tablet take 1 tablet by mouth once daily  . hydroxychloroquine (PLAQUENIL) 200 MG tablet Take 200 mg by mouth daily.   Marland Kitchen KETOCONAZOLE, TOPICAL, 1 % SHAM Apply 1 application topically every other day.   . lisinopril (PRINIVIL,ZESTRIL) 5 MG tablet Take 1 tablet (5 mg total) by mouth daily.  . meloxicam (MOBIC) 7.5 MG tablet Take 1 tablet (7.5 mg total) by mouth 2 (two) times daily.  . mometasone (ELOCON) 0.1 % ointment Apply 1  application topically 2 (two) times daily as needed (lupus outbreaks).  . montelukast (SINGULAIR) 10 MG tablet Take 10 mg by mouth at bedtime.  . norethindrone (MICRONOR,CAMILA,ERRIN) 0.35 MG tablet Take 1 tablet by mouth daily.   Marland Kitchen oxyCODONE-acetaminophen (PERCOCET/ROXICET) 5-325 MG per tablet Take 1 tablet by mouth every 6 (six) hours as needed for moderate pain.   Marland Kitchen PULMICORT FLEXHALER 90 MCG/ACT inhaler Inhale 2 puffs into the lungs daily.   . tacrolimus (PROTOPIC) 0.1 % ointment Apply 1 application topically daily.  Marland Kitchen tiZANidine (ZANAFLEX) 4 MG tablet Take 4 mg by mouth every 8 (eight) hours as needed for muscle spasms.  Marland Kitchen tretinoin (RETIN-A) 0.05 % cream Apply 1 application topically at bedtime.   . triamcinolone cream (KENALOG) 0.1 % Apply 1 application topically 2 (two) times daily as needed (outbreaks).   . nystatin (MYCOSTATIN) 100000 UNIT/ML suspension Take 5 mLs by mouth 4 (four) times daily. 10 days dose started on 02-25-17  . Phendimetrazine Tartrate 105 MG CP24 Take 105 mg by mouth daily.  Vladimir Faster Glycol-Propyl Glycol (SYSTANE ULTRA) 0.4-0.3 % SOLN Place 1 drop into both eyes daily as needed (for dry eyes).   . Vitamin D, Ergocalciferol, (DRISDOL) 50000 units CAPS capsule Take 50,000 Units by mouth every 7 (seven) days. On wednesdays   No facility-administered encounter medications on file as of 12/05/2017.     Surgical History: Past Surgical History:  Procedure Laterality Date  . carpel tunnel  Left 2014  . CRANIOTOMY  14,06   chiari  . DENTAL SURGERY    . HERNIA REPAIR     umbilical  . PLACEMENT OF LUMBAR DRAIN N/A 07/07/2014   Procedure: PLACEMENT OF LUMBAR DRAIN AND CLOSURE OF CERVICAL INCISION;  Surgeon: Newman Pies, MD;  Location: [place] [facility];  Service: Neurosurgery;  Laterality: N/A;  . SUBOCCIPITAL CRANIECTOMY CERVICAL LAMINECTOMY N/A 09/29/2013   Procedure: SUBOCCIPITAL CRANIECTOMY CERVICAL LAMINECTOMY/DURAPLASTY;  Surgeon: Ophelia Charter, MD;  Location:  [place] [facility];  Service: Neurosurgery;  Laterality: N/A;  posterior  . SUBOCCIPITAL CRANIECTOMY CERVICAL LAMINECTOMY N/A 06/23/2014   Procedure: SUBOCCIPITAL CRANIECTOMY CERVICAL LAMINECTOMY/DURAPLASTY;  Surgeon: Newman Pies, MD;  Location: [place] [facility];  Service: Neurosurgery;  Laterality: N/A;  suboccipital craniectomy with cervical laminectomy and duraplasty    Medical History: Past Medical History:  Diagnosis Date  . Anxiety   . Arthritis    rheumo possible  . Chiari malformation   . Depression   . Dizziness   . Headache(784.0)   . History of kidney stones   . Hypertension   . Lupus   . Lupus   . Nerve damage    NECK  . PONV (postoperative nausea and vomiting)    nausea only  . TMJ arthritis     Family History: Family History  Problem Relation Age of Onset  . Heart disease Father   . Hypertension Father   . Hypertension Mother   . Thyroid disease Mother   . Asthma Unknown   . Depression Unknown   . Diabetes Unknown   .  Migraines Unknown   . Stroke Unknown     Social History   Socioeconomic History  . Marital status: Single    Spouse name: Not on file  . Number of children: Not on file  . Years of education: Not on file  . Highest education level: Not on file  Social Needs  . Financial resource strain: Not on file  . Food insecurity - worry: Not on file  . Food insecurity - inability: Not on file  . Transportation needs - medical: Not on file  . Transportation needs - non-medical: Not on file  Occupational History  . Not on file  Tobacco Use  . Smoking status: Never Smoker  . Smokeless tobacco: Never Used  Substance and Sexual Activity  . Alcohol use: No    Alcohol/week: 0.0 oz  . Drug use: No  . Sexual activity: Not on file  Other Topics Concern  . Not on file  Social History Narrative   Lives with 3 children in 2 story home.  Unemployed.  Trying to get disability.  Education: high school.      Review of Systems  Constitutional: Positive  for fatigue. Negative for activity change, chills and fever.  HENT: Positive for congestion, ear pain, facial swelling, hoarse voice, postnasal drip, rhinorrhea, sinus pressure, sneezing, sore throat and voice change.   Respiratory: Positive for cough and wheezing.   Cardiovascular: Negative for chest pain and palpitations.  Gastrointestinal: Negative for constipation, diarrhea, nausea and vomiting.  Endocrine: Negative for cold intolerance, heat intolerance, polydipsia, polyphagia and polyuria.  Genitourinary: Negative.   Musculoskeletal: Positive for arthralgias, back pain and myalgias.       Generalized joint pain due to rheumatoid arthritis and lupus. She does see rheumatology and pain management.   Skin: Positive for rash.  Allergic/Immunologic: Positive for environmental allergies.  Neurological: Positive for headaches.  Hematological: Negative.    Today's Vitals   12/05/17 0954  BP: 112/79  Pulse: 93  Resp: 16  SpO2: 96%  Weight: 238 lb 6.4 oz (108.1 kg)  Height: 5\' 6"  (1.676 m)    Physical Exam  Constitutional: She is oriented to person, place, and time. She appears well-developed and well-nourished. No distress.  HENT:  Head: Normocephalic and atraumatic.  Right Ear: External ear normal.  Left Ear: External ear normal.  Mouth/Throat: Oropharynx is clear and moist. No oropharyngeal exudate.  Eyes: EOM are normal. Pupils are equal, round, and reactive to light.  Neck: Normal range of motion. Neck supple. No JVD present. No tracheal deviation present. No thyromegaly present.  Cardiovascular: Normal rate, regular rhythm and normal heart sounds. Exam reveals no gallop and no friction rub.  No murmur heard. Pulmonary/Chest: Effort normal and breath sounds normal. No respiratory distress. She has no wheezes. She has no rales.  Abdominal: Soft. Bowel sounds are normal. There is no tenderness.  Musculoskeletal: Normal range of motion.  Lymphadenopathy:    She has cervical  adenopathy.  Neurological: She is alert and oriented to person, place, and time. No cranial nerve deficit.  Skin: Skin is warm and dry. She is not diaphoretic.  Psychiatric: She has a normal mood and affect. Her behavior is normal. Judgment and thought content normal.   Assessment/Plan:  1. Acute recurrent sinusitis, unspecified location - methylPREDNISolone (MEDROL) 4 MG tablet; 6 day dose pack - take as directed for 6 days  Dispense: 21 tablet; Refill: 0 - clarithromycin (BIAXIN) 500 MG tablet; Take 1 tablet (500 mg total) by mouth 2 (two)  times daily.  Dispense: 20 tablet; Refill: 0  2. Allergic rhinitis, unspecified seasonality, unspecified trigger Continue allergy medication and nasal sprays as prescribed.   3. Essential hypertension Stable. Continue bp medication as prescribed.  4. Morbid obesity ([place]) - phentermine (ADIPEX-P) 37.5 MG tablet; Take 1 tablet (37.5 mg total) by mouth daily before breakfast.  Dispense: 30 tablet; Refill: 1  General Counseling: Neelah verbalizes understanding of the findings of todays visit and agrees with plan of treatment. I have discussed any further diagnostic evaluation that may be needed or ordered today. We also reviewed her medications today. she has been encouraged to call the office with any questions or concerns that should arise related to todays visit.   There is a liability release in patients' chart. There has been a 10 minute discussion about the side effects including but not limited to elevated blood pressure, anxiety, lack of sleep and dry mouth. Pt understands and will like to start/continue on appetite suppressant at this time. There will be one month RX given at the time of visit with proper follow up. [place] diet plan with restricted calories is given to the pt. Pt understands and agrees with  plan of treatment   Time spent: 2 Minutes   Dr Lavera Guise Internal medicine

## 2017-12-11 DIAGNOSIS — M25561 Pain in right knee: Secondary | ICD-10-CM | POA: Diagnosis not present

## 2017-12-11 DIAGNOSIS — G894 Chronic pain syndrome: Secondary | ICD-10-CM | POA: Diagnosis not present

## 2017-12-11 DIAGNOSIS — M542 Cervicalgia: Secondary | ICD-10-CM | POA: Diagnosis not present

## 2017-12-11 DIAGNOSIS — M25512 Pain in left shoulder: Secondary | ICD-10-CM | POA: Diagnosis not present

## 2017-12-11 DIAGNOSIS — M25511 Pain in right shoulder: Secondary | ICD-10-CM | POA: Diagnosis not present

## 2017-12-11 DIAGNOSIS — M25562 Pain in left knee: Secondary | ICD-10-CM | POA: Diagnosis not present

## 2017-12-11 DIAGNOSIS — M545 Low back pain: Secondary | ICD-10-CM | POA: Diagnosis not present

## 2017-12-29 ENCOUNTER — Other Ambulatory Visit: Payer: Self-pay | Admitting: Nurse Practitioner

## 2017-12-29 DIAGNOSIS — J0191 Acute recurrent sinusitis, unspecified: Secondary | ICD-10-CM

## 2017-12-29 MED ORDER — METHYLPREDNISOLONE 4 MG PO TABS: ORAL_TABLET | ORAL | 0 refills | Status: DC

## 2017-12-29 MED ORDER — CLARITHROMYCIN 500 MG PO TABS: 500.0000 mg | ORAL_TABLET | Freq: Two times a day (BID) | ORAL | 0 refills | Status: DC

## 2017-12-29 NOTE — Progress Notes (Signed)
Sent in new rx for biaxine bid for 10 days and medrol 6 day dose pack. Take as directed for 6 days.

## 2017-12-30 ENCOUNTER — Telehealth: Payer: Self-pay

## 2017-12-30 NOTE — Telephone Encounter (Signed)
-----   Message from Ronnell Freshwater, NP sent at 12/29/2017  5:41 PM EST ----- She has been seen 3 to 4 times over last 3 or 4 months for same thing. I Sent in new rx for biaxin bid for 10 days and medrol 6 day dose pack. Take as directed for 6 days. She also sees Dr. Pryor Ochoa for this and she should schedule appointment with him for further evaluation since this has been such a recurrent and chronic issue.   ----- Message ----- From: Laurie Panda Sent: 12/29/2017   4:15 PM To: Ronnell Freshwater, NP  PT WAS SEEN ONE MONTG AGO FOR SINUS INF AND SORE THROAT AND COUGH AND WATERY EYES AND SHE IS REQUESTING MEDICATION CALLED IN FIR THIS WITHOUT BEING SEEN.BR

## 2017-12-30 NOTE — Telephone Encounter (Signed)
PER MESSAGE WITH HB. PT  [facility] WERE CALLED IN AND ADVISED IF SYMPTOMS RE OCCUR THEN SHE NEEDS TO CALL ENT FOR APPT/ BR

## 2018-01-01 ENCOUNTER — Other Ambulatory Visit: Payer: Self-pay | Admitting: Internal Medicine

## 2018-01-08 ENCOUNTER — Other Ambulatory Visit: Payer: Self-pay

## 2018-01-08 DIAGNOSIS — M545 Low back pain: Secondary | ICD-10-CM | POA: Diagnosis not present

## 2018-01-08 DIAGNOSIS — M5412 Radiculopathy, cervical region: Secondary | ICD-10-CM | POA: Diagnosis not present

## 2018-01-08 DIAGNOSIS — M542 Cervicalgia: Secondary | ICD-10-CM | POA: Diagnosis not present

## 2018-01-08 DIAGNOSIS — M25561 Pain in right knee: Secondary | ICD-10-CM | POA: Diagnosis not present

## 2018-01-08 DIAGNOSIS — M25562 Pain in left knee: Secondary | ICD-10-CM | POA: Diagnosis not present

## 2018-01-08 DIAGNOSIS — G890 Central pain syndrome: Secondary | ICD-10-CM | POA: Diagnosis not present

## 2018-01-08 MED ORDER — NORETHINDRONE 0.35 MG PO TABS: 1.0000 | ORAL_TABLET | Freq: Every day | ORAL | 3 refills | Status: DC

## 2018-01-14 DIAGNOSIS — M329 Systemic lupus erythematosus, unspecified: Secondary | ICD-10-CM | POA: Diagnosis not present

## 2018-01-14 DIAGNOSIS — L219 Seborrheic dermatitis, unspecified: Secondary | ICD-10-CM | POA: Diagnosis not present

## 2018-01-14 DIAGNOSIS — K130 Diseases of lips: Secondary | ICD-10-CM | POA: Diagnosis not present

## 2018-01-14 DIAGNOSIS — L700 Acne vulgaris: Secondary | ICD-10-CM | POA: Diagnosis not present

## 2018-01-14 DIAGNOSIS — L819 Disorder of pigmentation, unspecified: Secondary | ICD-10-CM | POA: Diagnosis not present

## 2018-01-19 ENCOUNTER — Other Ambulatory Visit: Payer: Self-pay

## 2018-01-19 MED ORDER — MONTELUKAST SODIUM 10 MG PO TABS: 10.0000 mg | ORAL_TABLET | Freq: Every day | ORAL | 5 refills | Status: DC

## 2018-01-27 ENCOUNTER — Encounter: Payer: Self-pay | Admitting: Nurse Practitioner

## 2018-02-05 ENCOUNTER — Other Ambulatory Visit: Payer: Self-pay

## 2018-02-05 MED ORDER — GABAPENTIN 300 MG PO CAPS: 300.0000 mg | ORAL_CAPSULE | Freq: Three times a day (TID) | ORAL | 3 refills | Status: DC

## 2018-02-05 MED ORDER — MONTELUKAST SODIUM 10 MG PO TABS: 10.0000 mg | ORAL_TABLET | Freq: Every day | ORAL | 5 refills | Status: DC

## 2018-02-05 MED ORDER — DICLOFENAC SODIUM 1 % TD GEL: 1.0000 "application " | Freq: Two times a day (BID) | TRANSDERMAL | 3 refills | Status: DC | PRN

## 2018-02-09 ENCOUNTER — Other Ambulatory Visit: Payer: Self-pay

## 2018-02-09 DIAGNOSIS — R600 Localized edema: Secondary | ICD-10-CM | POA: Diagnosis not present

## 2018-02-09 DIAGNOSIS — L930 Discoid lupus erythematosus: Secondary | ICD-10-CM | POA: Diagnosis not present

## 2018-02-09 DIAGNOSIS — Q0700 Arnold-Chiari syndrome without spina bifida or hydrocephalus: Secondary | ICD-10-CM | POA: Diagnosis not present

## 2018-02-09 MED ORDER — FLUTICASONE PROPIONATE 50 MCG/ACT NA SUSP: 1.0000 | Freq: Every day | NASAL | 1 refills | Status: DC

## 2018-02-09 MED ORDER — MELOXICAM 7.5 MG PO TABS: 7.5000 mg | ORAL_TABLET | Freq: Two times a day (BID) | ORAL | 0 refills | Status: DC

## 2018-02-09 MED ORDER — ALBUTEROL SULFATE HFA 108 (90 BASE) MCG/ACT IN AERS: 2.0000 | INHALATION_SPRAY | Freq: Every day | RESPIRATORY_TRACT | 1 refills | Status: DC

## 2018-02-10 ENCOUNTER — Encounter: Payer: Self-pay | Admitting: Internal Medicine

## 2018-02-22 ENCOUNTER — Other Ambulatory Visit: Payer: Self-pay | Admitting: Internal Medicine

## 2018-03-04 ENCOUNTER — Other Ambulatory Visit: Payer: Self-pay

## 2018-03-04 MED ORDER — CETIRIZINE HCL 10 MG PO TABS: 10.0000 mg | ORAL_TABLET | Freq: Every day | ORAL | 5 refills | Status: DC

## 2018-03-19 ENCOUNTER — Encounter: Payer: Self-pay | Admitting: Nurse Practitioner

## 2018-03-19 ENCOUNTER — Ambulatory Visit (INDEPENDENT_AMBULATORY_CARE_PROVIDER_SITE_OTHER): Payer: Medicare Other | Admitting: Nurse Practitioner

## 2018-03-19 VITALS — BP 122/84 | HR 82 | Temp 98.0°F | Resp 16 | Ht 66.0 in | Wt 233.0 lb

## 2018-03-19 DIAGNOSIS — J0191 Acute recurrent sinusitis, unspecified: Secondary | ICD-10-CM

## 2018-03-19 DIAGNOSIS — M329 Systemic lupus erythematosus, unspecified: Secondary | ICD-10-CM | POA: Diagnosis not present

## 2018-03-19 DIAGNOSIS — I10 Essential (primary) hypertension: Secondary | ICD-10-CM | POA: Diagnosis not present

## 2018-03-19 MED ORDER — CLARITHROMYCIN 500 MG PO TABS: 500.0000 mg | ORAL_TABLET | Freq: Two times a day (BID) | ORAL | 0 refills | Status: DC

## 2018-03-19 MED ORDER — PREDNISONE 10 MG PO TABS: 10.0000 mg | ORAL_TABLET | Freq: Every day | ORAL | 0 refills | Status: DC

## 2018-03-19 NOTE — Progress Notes (Signed)
[facility] [place], [place] [address]  Internal MEDICINE  Office Visit Note  Patient Name: Morgan Kent  [id number]  [id number]  Date of Service: 03/20/2018  Pt is here for routine follow up.   Chief Complaint  Patient presents with  . Sinusitis    The patient has been having frequent flares of her lupus. Has discoid lupus which affects her skin, especially on her face and arms. Has seen her rheumatologist. Labs were taken and were unchanged. No changes were made to help with flare of lupus. Pain is basically well controlled and she does see pain management provider to help with pain.   Sinusitis  This is a recurrent problem. The current episode started in the past 7 days. The problem has been gradually worsening since onset. There has been no fever. Her pain is at a severity of 4/10. The pain is moderate. Associated symptoms include congestion, coughing, ear pain, headaches, a hoarse voice, sinus pressure, sneezing and a sore throat. Pertinent negatives include no chills. Past treatments include acetaminophen and antibiotics. The treatment provided mild relief.       Current Medication: Outpatient Encounter Medications as of 03/19/2018  Medication Sig  . adapalene (DIFFERIN) 0.1 % gel Apply 1 application topically at bedtime. Apply to forehead and chin  . albuterol (PROVENTIL HFA;VENTOLIN HFA) 108 (90 Base) MCG/ACT inhaler Inhale 2 puffs into the lungs daily.  Marland Kitchen antiseptic oral rinse (BIOTENE) LIQD 15 mLs by Mouth Rinse route 3 (three) times daily.   . carvedilol (COREG) 25 MG tablet take 1 tablet by mouth twice a day  . cetirizine (ZYRTEC) 10 MG tablet Take 1 tablet (10 mg total) by mouth daily.  . clarithromycin (BIAXIN) 500 MG tablet Take 1 tablet (500 mg total) by mouth 2 (two) times daily.  . clindamycin (CLEOCIN T) 1 % lotion Apply 1 application topically 2 (two) times daily.   . clobetasol (TEMOVATE) 0.05 % external solution Apply 1 application  topically 2 (two) times daily as needed. Apply to scalp  . diclofenac sodium (VOLTAREN) 1 % GEL Apply 1 application topically 2 (two) times daily as needed (for pain).  . fluticasone (FLONASE) 50 MCG/ACT nasal spray Place 1 spray into both nostrils at bedtime.  . gabapentin (NEURONTIN) 300 MG capsule Take 1 capsule (300 mg total) by mouth 3 (three) times daily.  . hydrochlorothiazide (HYDRODIURIL) 25 MG tablet take 1 tablet by mouth once daily  . hydroxychloroquine (PLAQUENIL) 200 MG tablet Take 200 mg by mouth daily.   Marland Kitchen KETOCONAZOLE, TOPICAL, 1 % SHAM Apply 1 application topically every other day.   . lisinopril (PRINIVIL,ZESTRIL) 5 MG tablet Take 1 tablet (5 mg total) by mouth daily.  . meloxicam (MOBIC) 7.5 MG tablet Take 1 tablet (7.5 mg total) by mouth 2 (two) times daily.  . methylPREDNISolone (MEDROL) 4 MG tablet 6 day dose pack - take as directed for 6 days  . mometasone (ELOCON) 0.1 % ointment Apply 1 application topically 2 (two) times daily as needed (lupus outbreaks).  . montelukast (SINGULAIR) 10 MG tablet Take 1 tablet (10 mg total) by mouth at bedtime.  . norethindrone (MICRONOR,CAMILA,ERRIN) 0.35 MG tablet Take 1 tablet (0.35 mg total) by mouth daily.  Marland Kitchen nystatin (MYCOSTATIN) 100000 UNIT/ML suspension Take 5 mLs by mouth 4 (four) times daily. 10 days dose started on 02-25-17  . oxyCODONE-acetaminophen (PERCOCET/ROXICET) 5-325 MG per tablet Take 1 tablet by mouth every 6 (six) hours as needed for moderate pain.   Marland Kitchen  Phendimetrazine Tartrate 105 MG CP24 Take 105 mg by mouth daily.  . phentermine (ADIPEX-P) 37.5 MG tablet Take 1 tablet (37.5 mg total) by mouth daily before breakfast.  . Polyethyl Glycol-Propyl Glycol (SYSTANE ULTRA) 0.4-0.3 % SOLN Place 1 drop into both eyes daily as needed (for dry eyes).   . predniSONE (DELTASONE) 10 MG tablet Take 1 tablet (10 mg total) by mouth daily with breakfast.  . PULMICORT FLEXHALER 90 MCG/ACT inhaler INHALE 2 PUFFS BY MOUTH TWICE DAILY  .  tacrolimus (PROTOPIC) 0.1 % ointment Apply 1 application topically daily.  Marland Kitchen tiZANidine (ZANAFLEX) 4 MG tablet Take 4 mg by mouth every 8 (eight) hours as needed for muscle spasms.  Marland Kitchen tretinoin (RETIN-A) 0.05 % cream Apply 1 application topically at bedtime.   . triamcinolone cream (KENALOG) 0.1 % Apply 1 application topically 2 (two) times daily as needed (outbreaks).   . Vitamin D, Ergocalciferol, (DRISDOL) 50000 units CAPS capsule Take 50,000 Units by mouth every 7 (seven) days. On wednesdays  . [DISCONTINUED] clarithromycin (BIAXIN) 500 MG tablet Take 1 tablet (500 mg total) by mouth 2 (two) times daily.   No facility-administered encounter medications on file as of 03/19/2018.     Surgical History: Past Surgical History:  Procedure Laterality Date  . carpel tunnel  Left 2014  . CRANIOTOMY  14,06   chiari  . DENTAL SURGERY    . HERNIA REPAIR     umbilical  . PLACEMENT OF LUMBAR DRAIN N/A 07/07/2014   Procedure: PLACEMENT OF LUMBAR DRAIN AND CLOSURE OF CERVICAL INCISION;  Surgeon: Newman Pies, MD;  Location: [place] [facility];  Service: Neurosurgery;  Laterality: N/A;  . SUBOCCIPITAL CRANIECTOMY CERVICAL LAMINECTOMY N/A 09/29/2013   Procedure: SUBOCCIPITAL CRANIECTOMY CERVICAL LAMINECTOMY/DURAPLASTY;  Surgeon: Ophelia Charter, MD;  Location: [place] [facility];  Service: Neurosurgery;  Laterality: N/A;  posterior  . SUBOCCIPITAL CRANIECTOMY CERVICAL LAMINECTOMY N/A 06/23/2014   Procedure: SUBOCCIPITAL CRANIECTOMY CERVICAL LAMINECTOMY/DURAPLASTY;  Surgeon: Newman Pies, MD;  Location: [place] [facility];  Service: Neurosurgery;  Laterality: N/A;  suboccipital craniectomy with cervical laminectomy and duraplasty    Medical History: Past Medical History:  Diagnosis Date  . Anxiety   . Arthritis    rheumo possible  . Chiari malformation   . Depression   . Dizziness   . Headache(784.0)   . History of kidney stones   . Hypertension   . Lupus ([place])   . Lupus ([place])   . Nerve damage    NECK   . PONV (postoperative nausea and vomiting)    nausea only  . TMJ arthritis     Family History: Family History  Problem Relation Age of Onset  . Heart disease Father   . Hypertension Father   . Hypertension Mother   . Thyroid disease Mother   . Asthma Unknown   . Depression Unknown   . Diabetes Unknown   . Migraines Unknown   . Stroke Unknown     Social History   Socioeconomic History  . Marital status: Single    Spouse name: Not on file  . Number of children: Not on file  . Years of education: Not on file  . Highest education level: Not on file  Occupational History  . Not on file  Social Needs  . Financial resource strain: Not on file  . Food insecurity:    Worry: Not on file    Inability: Not on file  . Transportation needs:    Medical: Not on file    Non-medical:  Not on file  Tobacco Use  . Smoking status: Current Every Day Smoker    Types: Cigarettes  . Smokeless tobacco: Never Used  Substance and Sexual Activity  . Alcohol use: No    Alcohol/week: 0.0 oz  . Drug use: No  . Sexual activity: Not on file  Lifestyle  . Physical activity:    Days per week: Not on file    Minutes per session: Not on file  . Stress: Not on file  Relationships  . Social connections:    Talks on phone: Not on file    Gets together: Not on file    Attends religious service: Not on file    Active member of club or organization: Not on file    Attends meetings of clubs or organizations: Not on file    Relationship status: Not on file  . Intimate partner violence:    Fear of current or ex partner: Not on file    Emotionally abused: Not on file    Physically abused: Not on file    Forced sexual activity: Not on file  Other Topics Concern  . Not on file  Social History Narrative   Lives with 3 children in 2 story home.  Unemployed.  Trying to get disability.  Education: high school.      Review of Systems  Constitutional: Positive for fatigue. Negative for activity  change, chills and fever.  HENT: Positive for congestion, ear pain, facial swelling, hoarse voice, postnasal drip, rhinorrhea, sinus pressure, sneezing, sore throat and voice change.   Eyes: Negative.   Respiratory: Positive for cough and wheezing.   Cardiovascular: Negative for chest pain and palpitations.  Gastrointestinal: Negative for constipation, diarrhea, nausea and vomiting.  Endocrine: Negative for cold intolerance, heat intolerance, polydipsia, polyphagia and polyuria.  Genitourinary: Negative.   Musculoskeletal: Positive for arthralgias, back pain and myalgias.       Generalized joint pain due to rheumatoid arthritis and lupus. She does see rheumatology and pain management.   Skin: Positive for rash.       Flare of discoid lupus affecting the skin on her face and forearms.   Allergic/Immunologic: Positive for environmental allergies.  Neurological: Positive for headaches.  Hematological: Negative.   Psychiatric/Behavioral: Positive for dysphoric mood. The patient is nervous/anxious.     Today's Vitals   03/19/18 1051  BP: 122/84  Pulse: 82  Resp: 16  Temp: 98 F (36.7 C)  SpO2: 97%  Weight: 233 lb (105.7 kg)  Height: 5\' 6"  (1.676 m)    Physical Exam  Constitutional: She is oriented to person, place, and time. She appears well-developed and well-nourished. No distress.  HENT:  Head: Normocephalic and atraumatic.  Right Ear: External ear normal.  Left Ear: External ear normal.  Mouth/Throat: Oropharynx is clear and moist. No oropharyngeal exudate.  Eyes: Pupils are equal, round, and reactive to light. EOM are normal.  Neck: Normal range of motion. Neck supple. No JVD present. No tracheal deviation present. No thyromegaly present.  Cardiovascular: Normal rate, regular rhythm and normal heart sounds. Exam reveals no gallop and no friction rub.  No murmur heard. Pulmonary/Chest: Effort normal and breath sounds normal. No respiratory distress. She has no wheezes. She has  no rales.  Abdominal: Soft. Bowel sounds are normal. There is no tenderness.  Musculoskeletal: Normal range of motion.  Lymphadenopathy:    She has cervical adenopathy.  Neurological: She is alert and oriented to person, place, and time. No cranial nerve deficit.  Skin:  Skin is warm and dry. She is not diaphoretic.  Psychiatric: She has a normal mood and affect. Her behavior is normal. Judgment and thought content normal.  Nursing note and vitals reviewed.  Assessment/Plan:  1. Acute recurrent sinusitis, unspecified location Treat with biaxin BID for 10 days. Rest and increase fluids. OTC medications to alleviate symptoms.  - predniSONE (DELTASONE) 10 MG tablet; Take 1 tablet (10 mg total) by mouth daily with breakfast.  Dispense: 48 tablet; Refill: 0 - clarithromycin (BIAXIN) 500 MG tablet; Take 1 tablet (500 mg total) by mouth 2 (two) times daily.  Dispense: 20 tablet; Refill: 0  2. Lupus (HCC) Frequent flares. Prednisone dose pack - take as directed for 12 days. Follow up with rheumatology as scheduled.   3. Essential hypertension Stable. Continue bp medication as prescribed.    General Counseling: Natayah verbalizes understanding of the findings of todays visit and agrees with plan of treatment. I have discussed any further diagnostic evaluation that may be needed or ordered today. We also reviewed her medications today. she has been encouraged to call the office with any questions or concerns that should arise related to todays visit.   Rest and increase fluids. Continue using OTC medication to control symptoms.   .  Meds ordered this encounter  Medications  . predniSONE (DELTASONE) 10 MG tablet    Sig: Take 1 tablet (10 mg total) by mouth daily with breakfast.    Dispense:  48 tablet    Refill:  0    Order Specific Question:   Supervising Provider    Answer:   Lavera Guise [place]  . clarithromycin (BIAXIN) 500 MG tablet    Sig: Take 1 tablet (500 mg total) by mouth 2  (two) times daily.    Dispense:  20 tablet    Refill:  0    Order Specific Question:   Supervising Provider    Answer:   Lavera Guise [[id number]]    Time spent: 75 Minutes     Dr Lavera Guise Internal medicine

## 2018-03-20 DIAGNOSIS — J0191 Acute recurrent sinusitis, unspecified: Secondary | ICD-10-CM | POA: Insufficient documentation

## 2018-03-25 ENCOUNTER — Other Ambulatory Visit: Payer: Self-pay | Admitting: Nurse Practitioner

## 2018-03-25 MED ORDER — BUDESONIDE 90 MCG/ACT IN AEPB: 2.0000 | INHALATION_SPRAY | Freq: Two times a day (BID) | RESPIRATORY_TRACT | 0 refills | Status: DC

## 2018-04-02 ENCOUNTER — Other Ambulatory Visit: Payer: Self-pay

## 2018-04-02 MED ORDER — GABAPENTIN 300 MG PO CAPS: 300.0000 mg | ORAL_CAPSULE | Freq: Three times a day (TID) | ORAL | 1 refills | Status: DC

## 2018-04-07 ENCOUNTER — Other Ambulatory Visit: Payer: Self-pay | Admitting: Nurse Practitioner

## 2018-04-07 MED ORDER — MONTELUKAST SODIUM 10 MG PO TABS: 10.0000 mg | ORAL_TABLET | Freq: Every day | ORAL | 3 refills | Status: DC

## 2018-04-08 DIAGNOSIS — M79662 Pain in left lower leg: Secondary | ICD-10-CM | POA: Diagnosis not present

## 2018-04-08 DIAGNOSIS — M79661 Pain in right lower leg: Secondary | ICD-10-CM | POA: Diagnosis not present

## 2018-04-08 DIAGNOSIS — G894 Chronic pain syndrome: Secondary | ICD-10-CM | POA: Diagnosis not present

## 2018-04-08 DIAGNOSIS — M545 Low back pain: Secondary | ICD-10-CM | POA: Diagnosis not present

## 2018-04-08 DIAGNOSIS — M25511 Pain in right shoulder: Secondary | ICD-10-CM | POA: Diagnosis not present

## 2018-04-08 DIAGNOSIS — M25512 Pain in left shoulder: Secondary | ICD-10-CM | POA: Diagnosis not present

## 2018-04-10 ENCOUNTER — Other Ambulatory Visit: Payer: Self-pay

## 2018-04-10 MED ORDER — NORETHINDRONE 0.35 MG PO TABS: 1.0000 | ORAL_TABLET | Freq: Every day | ORAL | 3 refills | Status: DC

## 2018-04-16 ENCOUNTER — Ambulatory Visit (INDEPENDENT_AMBULATORY_CARE_PROVIDER_SITE_OTHER): Payer: Medicare Other | Admitting: Nurse Practitioner

## 2018-04-16 ENCOUNTER — Encounter: Payer: Self-pay | Admitting: Nurse Practitioner

## 2018-04-16 VITALS — BP 147/91 | HR 88 | Temp 98.1°F | Resp 16 | Ht 66.0 in | Wt 238.0 lb

## 2018-04-16 DIAGNOSIS — J309 Allergic rhinitis, unspecified: Secondary | ICD-10-CM | POA: Diagnosis not present

## 2018-04-16 DIAGNOSIS — M329 Systemic lupus erythematosus, unspecified: Secondary | ICD-10-CM

## 2018-04-16 DIAGNOSIS — J0191 Acute recurrent sinusitis, unspecified: Secondary | ICD-10-CM

## 2018-04-16 MED ORDER — DOXYCYCLINE HYCLATE 100 MG PO TABS: 100.0000 mg | ORAL_TABLET | Freq: Two times a day (BID) | ORAL | 0 refills | Status: DC

## 2018-04-16 NOTE — Progress Notes (Signed)
[facility] [place], [place] [address]  Internal MEDICINE  Office Visit Note  Patient Name: Morgan Kent  [id number]  [id number]  Date of Service: 04/16/2018  Chief Complaint  Patient presents with  . Sinusitis    recurrent      Treated on May 9 with antibiotics and steroids. Finished antibiotics may 19. Continues to be on steroid treatment. Felt good for about a week, then symptoms started to begin again. Having a lot of nasal congestion and post nasal drip. She has mild headache and no fever. She does have some nausea without vomiting. The patient did see ENT earlier in  The year due to recurrent sinusitis. She was cleared and released by there office.   Pt is here for a sick visit.     Current Medication:  Outpatient Encounter Medications as of 04/16/2018  Medication Sig  . adapalene (DIFFERIN) 0.1 % gel Apply 1 application topically at bedtime. Apply to forehead and chin  . albuterol (PROVENTIL HFA;VENTOLIN HFA) 108 (90 Base) MCG/ACT inhaler Inhale 2 puffs into the lungs daily.  Marland Kitchen antiseptic oral rinse (BIOTENE) LIQD 15 mLs by Mouth Rinse route 3 (three) times daily.   . Budesonide (PULMICORT FLEXHALER) 90 MCG/ACT inhaler Inhale 2 puffs into the lungs 2 (two) times daily.  . carvedilol (COREG) 25 MG tablet take 1 tablet by mouth twice a day  . cetirizine (ZYRTEC) 10 MG tablet Take 1 tablet (10 mg total) by mouth daily.  . clarithromycin (BIAXIN) 500 MG tablet Take 1 tablet (500 mg total) by mouth 2 (two) times daily. (Patient not taking: Reported on 04/16/2018)  . clindamycin (CLEOCIN T) 1 % lotion Apply 1 application topically 2 (two) times daily.   . clobetasol (TEMOVATE) 0.05 % external solution Apply 1 application topically 2 (two) times daily as needed. Apply to scalp  . diclofenac sodium (VOLTAREN) 1 % GEL Apply 1 application topically 2 (two) times daily as needed (for pain).  Marland Kitchen doxycycline (VIBRA-TABS) 100 MG tablet Take 1 tablet (100 mg  total) by mouth 2 (two) times daily.  . fluticasone (FLONASE) 50 MCG/ACT nasal spray Place 1 spray into both nostrils at bedtime.  . gabapentin (NEURONTIN) 300 MG capsule Take 1 capsule (300 mg total) by mouth 3 (three) times daily.  . hydrochlorothiazide (HYDRODIURIL) 25 MG tablet take 1 tablet by mouth once daily  . hydroxychloroquine (PLAQUENIL) 200 MG tablet Take 200 mg by mouth daily.   Marland Kitchen KETOCONAZOLE, TOPICAL, 1 % SHAM Apply 1 application topically every other day.   . lisinopril (PRINIVIL,ZESTRIL) 5 MG tablet Take 1 tablet (5 mg total) by mouth daily.  . meloxicam (MOBIC) 7.5 MG tablet Take 1 tablet (7.5 mg total) by mouth 2 (two) times daily.  . methylPREDNISolone (MEDROL) 4 MG tablet 6 day dose pack - take as directed for 6 days  . mometasone (ELOCON) 0.1 % ointment Apply 1 application topically 2 (two) times daily as needed (lupus outbreaks).  . montelukast (SINGULAIR) 10 MG tablet Take 1 tablet (10 mg total) by mouth at bedtime.  . norethindrone (MICRONOR,CAMILA,ERRIN) 0.35 MG tablet Take 1 tablet (0.35 mg total) by mouth daily.  Marland Kitchen nystatin (MYCOSTATIN) 100000 UNIT/ML suspension Take 5 mLs by mouth 4 (four) times daily. 10 days dose started on 02-25-17  . oxyCODONE-acetaminophen (PERCOCET/ROXICET) 5-325 MG per tablet Take 1 tablet by mouth every 6 (six) hours as needed for moderate pain.   Marland Kitchen Phendimetrazine Tartrate 105 MG CP24 Take 105 mg by mouth daily.  Marland Kitchen  phentermine (ADIPEX-P) 37.5 MG tablet Take 1 tablet (37.5 mg total) by mouth daily before breakfast.  . Polyethyl Glycol-Propyl Glycol (SYSTANE ULTRA) 0.4-0.3 % SOLN Place 1 drop into both eyes daily as needed (for dry eyes).   . predniSONE (DELTASONE) 10 MG tablet Take 1 tablet (10 mg total) by mouth daily with breakfast.  . tacrolimus (PROTOPIC) 0.1 % ointment Apply 1 application topically daily.  Marland Kitchen tiZANidine (ZANAFLEX) 4 MG tablet Take 4 mg by mouth every 8 (eight) hours as needed for muscle spasms.  Marland Kitchen tretinoin (RETIN-A) 0.05 %  cream Apply 1 application topically at bedtime.   . triamcinolone cream (KENALOG) 0.1 % Apply 1 application topically 2 (two) times daily as needed (outbreaks).   . Vitamin D, Ergocalciferol, (DRISDOL) 50000 units CAPS capsule Take 50,000 Units by mouth every 7 (seven) days. On wednesdays   No facility-administered encounter medications on file as of 04/16/2018.       Medical History: Past Medical History:  Diagnosis Date  . Anxiety   . Arthritis    rheumo possible  . Chiari malformation   . Depression   . Dizziness   . Headache(784.0)   . History of kidney stones   . Hypertension   . Lupus ([place])   . Lupus ([place])   . Nerve damage    NECK  . PONV (postoperative nausea and vomiting)    nausea only  . TMJ arthritis      Vital Signs: BP (!) 147/91   Pulse 88   Temp 98.1 F (36.7 C)   Resp 16   Ht 5\' 6"  (1.676 m)   Wt 238 lb (108 kg)   SpO2 97%   BMI 38.41 kg/m    Review of Systems  Constitutional: Positive for fatigue. Negative for activity change, chills and fever.  HENT: Positive for congestion, ear pain, facial swelling, postnasal drip, rhinorrhea, sinus pressure and sneezing. Negative for sore throat and voice change.   Eyes: Negative.   Respiratory: Positive for cough and wheezing.   Cardiovascular: Negative for chest pain and palpitations.  Gastrointestinal: Positive for nausea. Negative for constipation, diarrhea and vomiting.  Endocrine: Negative for cold intolerance, heat intolerance, polydipsia, polyphagia and polyuria.  Genitourinary: Negative.   Musculoskeletal: Positive for arthralgias, back pain and myalgias.       Generalized joint pain due to rheumatoid arthritis and lupus. She does see rheumatology and pain management.   Skin: Positive for rash.       Flare of discoid lupus affecting the skin on her face and forearms.   Allergic/Immunologic: Positive for environmental allergies.  Neurological: Positive for headaches.  Hematological: Negative.    Psychiatric/Behavioral: Positive for dysphoric mood. The patient is nervous/anxious.     Physical Exam  Constitutional: She is oriented to person, place, and time. She appears well-developed and well-nourished. No distress.  HENT:  Head: Normocephalic and atraumatic.  Right Ear: External ear normal.  Left Ear: External ear normal.  Mouth/Throat: Oropharynx is clear and moist. No oropharyngeal exudate.  Eyes: Pupils are equal, round, and reactive to light. Conjunctivae and EOM are normal.  Neck: Normal range of motion. Neck supple. No JVD present. No tracheal deviation present. No thyromegaly present.  Cardiovascular: Normal rate, regular rhythm and normal heart sounds. Exam reveals no gallop and no friction rub.  No murmur heard. Pulmonary/Chest: Effort normal and breath sounds normal. No respiratory distress. She has no wheezes. She has no rales.  Abdominal: Soft. Bowel sounds are normal. There is no tenderness.  Musculoskeletal: Normal range of motion.  Lymphadenopathy:    She has cervical adenopathy.  Neurological: She is alert and oriented to person, place, and time. No cranial nerve deficit.  Skin: Skin is warm and dry. She is not diaphoretic.  Psychiatric: She has a normal mood and affect. Her behavior is normal. Judgment and thought content normal.  Nursing note and vitals reviewed.  Assessment/Plan: 1. Acute recurrent sinusitis, unspecified location Start doycycline 100mg  bid for 21 days. Continue steroid treatment as before. OTC medications to treat symptoms.  - doxycycline (VIBRA-TABS) 100 MG tablet; Take 1 tablet (100 mg total) by mouth 2 (two) times daily.  Dispense: 42 tablet; Refill: 0  2. Allergic rhinitis, unspecified seasonality, unspecified trigger Continue all allergy medications as prescribed   3. Lupus (HCC) Worsening. Has appointment upcoming with dermatology. Continue meds as prescribed.   General Counseling: Armina verbalizes understanding of the findings  of todays visit and agrees with plan of treatment. I have discussed any further diagnostic evaluation that may be needed or ordered today. We also reviewed her medications today. she has been encouraged to call the office with any questions or concerns that should arise related to todays visit.    Counseling:  Rest and increase fluids. Continue using OTC medication to control symptoms.   This patient was seen by Leretha Pol, FNP- C in Collaboration with Dr Lavera Guise as a part of collaborative care agreement  Meds ordered this encounter  Medications  . doxycycline (VIBRA-TABS) 100 MG tablet    Sig: Take 1 tablet (100 mg total) by mouth 2 (two) times daily.    Dispense:  42 tablet    Refill:  0    Order Specific Question:   Supervising Provider    Answer:   Lavera Guise [[id number]]    Time spent: 15 Minutes

## 2018-04-22 ENCOUNTER — Other Ambulatory Visit: Payer: Self-pay | Admitting: Nurse Practitioner

## 2018-04-22 MED ORDER — BUDESONIDE 90 MCG/ACT IN AEPB: 2.0000 | INHALATION_SPRAY | Freq: Two times a day (BID) | RESPIRATORY_TRACT | 0 refills | Status: DC

## 2018-04-28 ENCOUNTER — Other Ambulatory Visit: Payer: Self-pay

## 2018-04-29 DIAGNOSIS — L219 Seborrheic dermatitis, unspecified: Secondary | ICD-10-CM | POA: Diagnosis not present

## 2018-04-29 DIAGNOSIS — L700 Acne vulgaris: Secondary | ICD-10-CM | POA: Diagnosis not present

## 2018-04-29 DIAGNOSIS — M329 Systemic lupus erythematosus, unspecified: Secondary | ICD-10-CM | POA: Diagnosis not present

## 2018-04-30 ENCOUNTER — Other Ambulatory Visit: Payer: Self-pay | Admitting: Nurse Practitioner

## 2018-04-30 ENCOUNTER — Telehealth: Payer: Self-pay | Admitting: Nurse Practitioner

## 2018-04-30 DIAGNOSIS — J0191 Acute recurrent sinusitis, unspecified: Secondary | ICD-10-CM

## 2018-04-30 MED ORDER — PREDNISONE 10 MG PO TABS: 10.0000 mg | ORAL_TABLET | Freq: Every day | ORAL | 0 refills | Status: DC

## 2018-04-30 NOTE — Progress Notes (Signed)
Recurrent and persistent sinusitis. Add prednisone dose pack for 6 days. Continue allergy medication as prescribed. Will discuss further at her next visit.

## 2018-04-30 NOTE — Telephone Encounter (Signed)
Pt was informed.

## 2018-04-30 NOTE — Telephone Encounter (Signed)
Recurrent and persistent sinusitis. Add prednisone dose pack for 6 days. Continue allergy medication as prescribed. Will discuss further at her next visit.

## 2018-05-05 ENCOUNTER — Encounter: Payer: Self-pay | Admitting: Nurse Practitioner

## 2018-05-05 ENCOUNTER — Ambulatory Visit (INDEPENDENT_AMBULATORY_CARE_PROVIDER_SITE_OTHER): Payer: Medicare Other | Admitting: Nurse Practitioner

## 2018-05-05 VITALS — BP 123/81 | HR 89 | Resp 16 | Ht 66.0 in | Wt 240.0 lb

## 2018-05-05 DIAGNOSIS — M329 Systemic lupus erythematosus, unspecified: Secondary | ICD-10-CM

## 2018-05-05 DIAGNOSIS — G935 Compression of brain: Secondary | ICD-10-CM | POA: Diagnosis not present

## 2018-05-05 DIAGNOSIS — Z0001 Encounter for general adult medical examination with abnormal findings: Secondary | ICD-10-CM

## 2018-05-05 DIAGNOSIS — I10 Essential (primary) hypertension: Secondary | ICD-10-CM | POA: Diagnosis not present

## 2018-05-05 DIAGNOSIS — Z124 Encounter for screening for malignant neoplasm of cervix: Secondary | ICD-10-CM

## 2018-05-05 DIAGNOSIS — R300 Dysuria: Secondary | ICD-10-CM

## 2018-05-05 NOTE — Progress Notes (Signed)
[facility] [place], [place] [address]  Internal MEDICINE  Office Visit Note  Patient Name: Morgan Kent  [id number]  [id number]  Date of Service: 05/28/2018    Pt is here for routine health maintenance examination  Chief Complaint  Patient presents with  . Annual Exam  . OTHER    taste buds gone been going on for 4 days  . Recurrent Sinusitis     The patient states that she has had loss of taste sensation for several days. She has history of Budd-Chiari malformation. Has had three surgeries to correct this. Symptoms are similar to when she had spinal fluid "backing" up due to this. She will need to have follow up with her neurosurgeon to discuss this finding.  She is currently on antibiotics and prednisone due to recurring sinusitis. She does feel better, but has headache in frontal sinus area.    Current Medication: Outpatient Encounter Medications as of 05/05/2018  Medication Sig  . adapalene (DIFFERIN) 0.1 % gel Apply 1 application topically at bedtime. Apply to forehead and chin  . albuterol (PROVENTIL HFA;VENTOLIN HFA) 108 (90 Base) MCG/ACT inhaler Inhale 2 puffs into the lungs daily.  Marland Kitchen antiseptic oral rinse (BIOTENE) LIQD 15 mLs by Mouth Rinse route 3 (three) times daily.   . Budesonide (PULMICORT FLEXHALER) 90 MCG/ACT inhaler Inhale 2 puffs into the lungs 2 (two) times daily.  . carvedilol (COREG) 25 MG tablet take 1 tablet by mouth twice a day  . cetirizine (ZYRTEC) 10 MG tablet Take 1 tablet (10 mg total) by mouth daily.  . clarithromycin (BIAXIN) 500 MG tablet Take 1 tablet (500 mg total) by mouth 2 (two) times daily.  . clindamycin (CLEOCIN T) 1 % lotion Apply 1 application topically 2 (two) times daily.   . clobetasol (TEMOVATE) 0.05 % external solution Apply 1 application topically 2 (two) times daily as needed. Apply to scalp  . diclofenac sodium (VOLTAREN) 1 % GEL Apply 1 application topically 2 (two) times daily as needed (for pain).   Marland Kitchen doxycycline (VIBRA-TABS) 100 MG tablet Take 1 tablet (100 mg total) by mouth 2 (two) times daily.  . fluticasone (FLONASE) 50 MCG/ACT nasal spray Place 1 spray into both nostrils at bedtime.  . gabapentin (NEURONTIN) 300 MG capsule Take 1 capsule (300 mg total) by mouth 3 (three) times daily.  . hydrochlorothiazide (HYDRODIURIL) 25 MG tablet take 1 tablet by mouth once daily  . hydroxychloroquine (PLAQUENIL) 200 MG tablet Take 200 mg by mouth daily.   Marland Kitchen KETOCONAZOLE, TOPICAL, 1 % SHAM Apply 1 application topically every other day.   . lisinopril (PRINIVIL,ZESTRIL) 5 MG tablet Take 1 tablet (5 mg total) by mouth daily.  . methylPREDNISolone (MEDROL) 4 MG tablet 6 day dose pack - take as directed for 6 days  . mometasone (ELOCON) 0.1 % ointment Apply 1 application topically 2 (two) times daily as needed (lupus outbreaks).  . montelukast (SINGULAIR) 10 MG tablet Take 1 tablet (10 mg total) by mouth at bedtime.  . norethindrone (MICRONOR,CAMILA,ERRIN) 0.35 MG tablet Take 1 tablet (0.35 mg total) by mouth daily.  Marland Kitchen nystatin (MYCOSTATIN) 100000 UNIT/ML suspension Take 5 mLs by mouth 4 (four) times daily. 10 days dose started on 02-25-17  . oxyCODONE-acetaminophen (PERCOCET/ROXICET) 5-325 MG per tablet Take 1 tablet by mouth every 6 (six) hours as needed for moderate pain.   Marland Kitchen Phendimetrazine Tartrate 105 MG CP24 Take 105 mg by mouth daily.  . phentermine (ADIPEX-P) 37.5 MG tablet Take 1  tablet (37.5 mg total) by mouth daily before breakfast.  . Polyethyl Glycol-Propyl Glycol (SYSTANE ULTRA) 0.4-0.3 % SOLN Place 1 drop into both eyes daily as needed (for dry eyes).   . predniSONE (DELTASONE) 10 MG tablet Take 1 tablet (10 mg total) by mouth daily with breakfast.  . tacrolimus (PROTOPIC) 0.1 % ointment Apply 1 application topically daily.  Marland Kitchen tiZANidine (ZANAFLEX) 4 MG tablet Take 4 mg by mouth every 8 (eight) hours as needed for muscle spasms.  Marland Kitchen tretinoin (RETIN-A) 0.05 % cream Apply 1 application  topically at bedtime.   . triamcinolone cream (KENALOG) 0.1 % Apply 1 application topically 2 (two) times daily as needed (outbreaks).   . Vitamin D, Ergocalciferol, (DRISDOL) 50000 units CAPS capsule Take 50,000 Units by mouth every 7 (seven) days. On wednesdays  . [DISCONTINUED] meloxicam (MOBIC) 7.5 MG tablet Take 1 tablet (7.5 mg total) by mouth 2 (two) times daily.   No facility-administered encounter medications on file as of 05/05/2018.     Surgical History: Past Surgical History:  Procedure Laterality Date  . carpel tunnel  Left 2014  . CRANIOTOMY  14,06   chiari  . DENTAL SURGERY    . HERNIA REPAIR     umbilical  . PLACEMENT OF LUMBAR DRAIN N/A 07/07/2014   Procedure: PLACEMENT OF LUMBAR DRAIN AND CLOSURE OF CERVICAL INCISION;  Surgeon: Newman Pies, MD;  Location: [place] [facility];  Service: Neurosurgery;  Laterality: N/A;  . SUBOCCIPITAL CRANIECTOMY CERVICAL LAMINECTOMY N/A 09/29/2013   Procedure: SUBOCCIPITAL CRANIECTOMY CERVICAL LAMINECTOMY/DURAPLASTY;  Surgeon: Ophelia Charter, MD;  Location: [place] [facility];  Service: Neurosurgery;  Laterality: N/A;  posterior  . SUBOCCIPITAL CRANIECTOMY CERVICAL LAMINECTOMY N/A 06/23/2014   Procedure: SUBOCCIPITAL CRANIECTOMY CERVICAL LAMINECTOMY/DURAPLASTY;  Surgeon: Newman Pies, MD;  Location: [place] [facility];  Service: Neurosurgery;  Laterality: N/A;  suboccipital craniectomy with cervical laminectomy and duraplasty    Medical History: Past Medical History:  Diagnosis Date  . Anxiety   . Arthritis    rheumo possible  . Chiari malformation   . Depression   . Dizziness   . Headache(784.0)   . History of kidney stones   . Hypertension   . Lupus ([place])   . Lupus ([place])   . Nerve damage    NECK  . PONV (postoperative nausea and vomiting)    nausea only  . TMJ arthritis     Family History: Family History  Problem Relation Age of Onset  . Heart disease Father   . Hypertension Father   . Hypertension Mother   . Thyroid disease  Mother   . Asthma Unknown   . Depression Unknown   . Diabetes Unknown   . Migraines Unknown   . Stroke Unknown       Review of Systems  Constitutional: Positive for fatigue. Negative for activity change, chills and fever.  HENT: Positive for sinus pressure and sinus pain. Negative for congestion, ear pain, facial swelling, postnasal drip, rhinorrhea, sore throat and voice change.        Taste bud changes and dry mouth.   Eyes: Negative.   Respiratory: Negative for cough and wheezing.   Cardiovascular: Negative for chest pain and palpitations.  Gastrointestinal: Positive for nausea. Negative for constipation, diarrhea and vomiting.  Endocrine: Negative for cold intolerance, heat intolerance, polydipsia, polyphagia and polyuria.  Genitourinary: Negative.   Musculoskeletal: Positive for arthralgias, back pain and myalgias.       Generalized joint pain due to rheumatoid arthritis and lupus. She does see  rheumatology and pain management.   Skin: Positive for rash.       Flare of discoid lupus affecting the skin on her face and forearms.   Allergic/Immunologic: Positive for environmental allergies.  Neurological: Positive for headaches.  Hematological: Negative for adenopathy.  Psychiatric/Behavioral: Positive for dysphoric mood. The patient is nervous/anxious.      Today's Vitals   05/05/18 1122  BP: 123/81  Pulse: 89  Resp: 16  SpO2: 99%  Weight: 240 lb (108.9 kg)  Height: 5\' 6"  (1.676 m)    Physical Exam  Constitutional: She is oriented to person, place, and time. She appears well-developed and well-nourished. No distress.  HENT:  Head: Normocephalic and atraumatic.  Right Ear: External ear normal.  Left Ear: External ear normal.  Nose: Nose normal.  Mouth/Throat: Oropharynx is clear and moist. No oropharyngeal exudate.  Eyes: Pupils are equal, round, and reactive to light. Conjunctivae and EOM are normal.  Neck: Normal range of motion. Neck supple. No JVD present. No  tracheal deviation present. No thyromegaly present.  Cardiovascular: Normal rate, regular rhythm, normal heart sounds and intact distal pulses. Exam reveals no gallop and no friction rub.  No murmur heard. Pulmonary/Chest: Effort normal and breath sounds normal. No respiratory distress. She has no wheezes. She has no rales.  Abdominal: Soft. Bowel sounds are normal. There is no tenderness.  Genitourinary: Vagina normal and uterus normal.  Genitourinary Comments: No tenderness, masses, or organomeglay present during bimanual exam .  Musculoskeletal: Normal range of motion.  Lymphadenopathy:    She has no cervical adenopathy.  Neurological: She is alert and oriented to person, place, and time. No cranial nerve deficit.  Skin: Skin is warm and dry. She is not diaphoretic.  Psychiatric: She has a normal mood and affect. Her behavior is normal. Judgment and thought content normal.  Nursing note and vitals reviewed.  Assessment/Plan: 1. Encounter for general adult medical examination with abnormal findings Annual health maintenance exam today  2. Essential hypertension Stable. Continue bp medication as prescribed   3. Chiari malformation type I ([place]) Two prior surgeries to repair in the past. Patient having increased problems again. Refer to neurosurgery for further evaluation and treatment. - Ambulatory referral to Neurosurgery  4. Lupus ([place]) Continue regular visits with rheumatollogy and dermatology as schedulled.   5. Screening for malignant neoplasm of cervix - Pap IG and HPV (high risk) DNA detection  6. Dysuria - Urinalysis, Routine w reflex microscopic  General Counseling: Sharen verbalizes understanding of the findings of todays visit and agrees with plan of treatment. I have discussed any further diagnostic evaluation that may be needed or ordered today. We also reviewed her medications today. she has been encouraged to call the office with any questions or concerns that should  arise related to todays visit.    Counseling:  This patient was seen by Leretha Pol FNP Collaboration with Dr Lavera Guise as a part of collaborative care agreement  Orders Placed This Encounter  Procedures  . Urinalysis, Routine w reflex microscopic  . Ambulatory referral to Neurosurgery      Time spent: [place], MD  Internal Medicine

## 2018-05-06 ENCOUNTER — Other Ambulatory Visit: Payer: Self-pay

## 2018-05-06 LAB — URINALYSIS, ROUTINE W REFLEX MICROSCOPIC
Bilirubin, UA: NEGATIVE
GLUCOSE, UA: NEGATIVE
Ketones, UA: NEGATIVE
LEUKOCYTES UA: NEGATIVE
Nitrite, UA: NEGATIVE
Protein, UA: NEGATIVE
RBC UA: NEGATIVE
Specific Gravity, UA: 1.023 (ref 1.005–1.030)
UUROB: 0.2 mg/dL (ref 0.2–1.0)
pH, UA: 5.5 (ref 5.0–7.5)

## 2018-05-06 MED ORDER — MELOXICAM 7.5 MG PO TABS: 7.5000 mg | ORAL_TABLET | Freq: Two times a day (BID) | ORAL | 0 refills | Status: DC

## 2018-05-08 DIAGNOSIS — M25511 Pain in right shoulder: Secondary | ICD-10-CM | POA: Diagnosis not present

## 2018-05-08 DIAGNOSIS — M545 Low back pain: Secondary | ICD-10-CM | POA: Diagnosis not present

## 2018-05-08 DIAGNOSIS — M79661 Pain in right lower leg: Secondary | ICD-10-CM | POA: Diagnosis not present

## 2018-05-08 DIAGNOSIS — M542 Cervicalgia: Secondary | ICD-10-CM | POA: Diagnosis not present

## 2018-05-08 DIAGNOSIS — M25512 Pain in left shoulder: Secondary | ICD-10-CM | POA: Diagnosis not present

## 2018-05-08 DIAGNOSIS — G894 Chronic pain syndrome: Secondary | ICD-10-CM | POA: Diagnosis not present

## 2018-05-08 DIAGNOSIS — M79662 Pain in left lower leg: Secondary | ICD-10-CM | POA: Diagnosis not present

## 2018-05-11 LAB — PAP IG AND HPV HIGH-RISK
HPV, HIGH-RISK: NEGATIVE
PAP SMEAR COMMENT: 0

## 2018-05-12 ENCOUNTER — Telehealth: Payer: Self-pay

## 2018-05-12 NOTE — Telephone Encounter (Signed)
Left message on pt voicemail informing her of her lab results.

## 2018-05-28 DIAGNOSIS — Z0001 Encounter for general adult medical examination with abnormal findings: Secondary | ICD-10-CM | POA: Insufficient documentation

## 2018-05-28 DIAGNOSIS — Z124 Encounter for screening for malignant neoplasm of cervix: Secondary | ICD-10-CM | POA: Insufficient documentation

## 2018-05-28 DIAGNOSIS — R300 Dysuria: Secondary | ICD-10-CM | POA: Insufficient documentation

## 2018-06-08 DIAGNOSIS — M25512 Pain in left shoulder: Secondary | ICD-10-CM | POA: Diagnosis not present

## 2018-06-08 DIAGNOSIS — M545 Low back pain: Secondary | ICD-10-CM | POA: Diagnosis not present

## 2018-06-08 DIAGNOSIS — M542 Cervicalgia: Secondary | ICD-10-CM | POA: Diagnosis not present

## 2018-06-08 DIAGNOSIS — M25511 Pain in right shoulder: Secondary | ICD-10-CM | POA: Diagnosis not present

## 2018-06-08 DIAGNOSIS — G894 Chronic pain syndrome: Secondary | ICD-10-CM | POA: Diagnosis not present

## 2018-06-08 DIAGNOSIS — M79662 Pain in left lower leg: Secondary | ICD-10-CM | POA: Diagnosis not present

## 2018-06-08 DIAGNOSIS — M79661 Pain in right lower leg: Secondary | ICD-10-CM | POA: Diagnosis not present

## 2018-06-09 ENCOUNTER — Other Ambulatory Visit: Payer: Self-pay | Admitting: Nurse Practitioner

## 2018-06-09 MED ORDER — BUDESONIDE 90 MCG/ACT IN AEPB: 2.0000 | INHALATION_SPRAY | Freq: Two times a day (BID) | RESPIRATORY_TRACT | 3 refills | Status: DC

## 2018-07-07 ENCOUNTER — Other Ambulatory Visit: Payer: Self-pay

## 2018-07-07 MED ORDER — DICLOFENAC SODIUM 1 % TD GEL: 1.0000 "application " | Freq: Two times a day (BID) | TRANSDERMAL | 3 refills | Status: DC | PRN

## 2018-07-07 MED ORDER — MELOXICAM 7.5 MG PO TABS: 7.5000 mg | ORAL_TABLET | Freq: Two times a day (BID) | ORAL | 1 refills | Status: DC

## 2018-07-08 ENCOUNTER — Other Ambulatory Visit: Payer: Self-pay | Admitting: Cardiovascular Disease

## 2018-07-10 ENCOUNTER — Other Ambulatory Visit: Payer: Self-pay

## 2018-07-10 DIAGNOSIS — M79662 Pain in left lower leg: Secondary | ICD-10-CM | POA: Diagnosis not present

## 2018-07-10 DIAGNOSIS — G894 Chronic pain syndrome: Secondary | ICD-10-CM | POA: Diagnosis not present

## 2018-07-10 DIAGNOSIS — M79661 Pain in right lower leg: Secondary | ICD-10-CM | POA: Diagnosis not present

## 2018-07-10 DIAGNOSIS — M25512 Pain in left shoulder: Secondary | ICD-10-CM | POA: Diagnosis not present

## 2018-07-10 DIAGNOSIS — M542 Cervicalgia: Secondary | ICD-10-CM | POA: Diagnosis not present

## 2018-07-10 DIAGNOSIS — M25511 Pain in right shoulder: Secondary | ICD-10-CM | POA: Diagnosis not present

## 2018-07-10 DIAGNOSIS — M5412 Radiculopathy, cervical region: Secondary | ICD-10-CM | POA: Diagnosis not present

## 2018-07-10 DIAGNOSIS — M545 Low back pain: Secondary | ICD-10-CM | POA: Diagnosis not present

## 2018-07-10 MED ORDER — CETIRIZINE HCL 10 MG PO TABS: 10.0000 mg | ORAL_TABLET | Freq: Every day | ORAL | 5 refills | Status: DC

## 2018-07-29 ENCOUNTER — Encounter: Payer: Self-pay | Admitting: Adult Health

## 2018-07-29 ENCOUNTER — Ambulatory Visit (INDEPENDENT_AMBULATORY_CARE_PROVIDER_SITE_OTHER): Payer: Medicare Other | Admitting: Adult Health

## 2018-07-29 VITALS — BP 133/83 | Temp 98.5°F | Resp 16 | Ht 66.0 in | Wt 244.0 lb

## 2018-07-29 DIAGNOSIS — J0110 Acute frontal sinusitis, unspecified: Secondary | ICD-10-CM

## 2018-07-29 DIAGNOSIS — I10 Essential (primary) hypertension: Secondary | ICD-10-CM

## 2018-07-29 DIAGNOSIS — M329 Systemic lupus erythematosus, unspecified: Secondary | ICD-10-CM

## 2018-07-29 MED ORDER — PREDNISONE 10 MG PO TABS: ORAL_TABLET | ORAL | 0 refills | Status: DC

## 2018-07-29 MED ORDER — CLINDAMYCIN HCL 300 MG PO CAPS: 300.0000 mg | ORAL_CAPSULE | Freq: Three times a day (TID) | ORAL | 0 refills | Status: DC

## 2018-07-29 NOTE — Progress Notes (Signed)
[facility] [place], [place] [address]  Internal MEDICINE  Office Visit Note  Patient Name: Morgan Kent  [id number]  [id number]  Date of Service: 08/04/2018  Chief Complaint  Patient presents with  . Hypertension  . Arthritis  . Anxiety  . Cough    going on few weeks   . Sinusitis   HPI Pt is here for a sick visit.  She reports facial pain, sinus pressure, runny nose and dry cough.  She reports post nasal drip and mildly sore throat in the morning.  She denies fever, sob.  She has a history of lupus.  She reports she has been fatigued, and sleeping a lot.  These symptoms have been going on for about a month.  She reports her child has been diagnosed with Strep this week.      Current Medication:  Outpatient Encounter Medications as of 07/29/2018  Medication Sig  . adapalene (DIFFERIN) 0.1 % gel Apply 1 application topically at bedtime. Apply to forehead and chin  . albuterol (PROVENTIL HFA;VENTOLIN HFA) 108 (90 Base) MCG/ACT inhaler Inhale 2 puffs into the lungs daily.  Marland Kitchen antiseptic oral rinse (BIOTENE) LIQD 15 mLs by Mouth Rinse route 3 (three) times daily.   . Budesonide (PULMICORT FLEXHALER) 90 MCG/ACT inhaler Inhale 2 puffs into the lungs 2 (two) times daily.  . carvedilol (COREG) 25 MG tablet TAKE 1 TABLET BY MOUTH TWICE A DAY  . cetirizine (ZYRTEC) 10 MG tablet Take 1 tablet (10 mg total) by mouth daily.  . clarithromycin (BIAXIN) 500 MG tablet Take 1 tablet (500 mg total) by mouth 2 (two) times daily.  . clindamycin (CLEOCIN T) 1 % lotion Apply 1 application topically 2 (two) times daily.   . clobetasol (TEMOVATE) 0.05 % external solution Apply 1 application topically 2 (two) times daily as needed. Apply to scalp  . diclofenac sodium (VOLTAREN) 1 % GEL Apply 1 application topically 2 (two) times daily as needed (for pain).  Marland Kitchen doxycycline (VIBRA-TABS) 100 MG tablet Take 1 tablet (100 mg total) by mouth 2 (two) times daily.  . fluticasone  (FLONASE) 50 MCG/ACT nasal spray Place 1 spray into both nostrils at bedtime.  . gabapentin (NEURONTIN) 300 MG capsule Take 1 capsule (300 mg total) by mouth 3 (three) times daily.  . hydrochlorothiazide (HYDRODIURIL) 25 MG tablet TAKE 1 TABLET BY MOUTH EVERY DAY  . hydroxychloroquine (PLAQUENIL) 200 MG tablet Take 200 mg by mouth daily.   Marland Kitchen KETOCONAZOLE, TOPICAL, 1 % SHAM Apply 1 application topically every other day.   . lisinopril (PRINIVIL,ZESTRIL) 5 MG tablet TAKE 1 TABLET BY MOUTH ONCE DAILY  . meloxicam (MOBIC) 7.5 MG tablet Take 1 tablet (7.5 mg total) by mouth 2 (two) times daily.  . methylPREDNISolone (MEDROL) 4 MG tablet 6 day dose pack - take as directed for 6 days  . mometasone (ELOCON) 0.1 % ointment Apply 1 application topically 2 (two) times daily as needed (lupus outbreaks).  . montelukast (SINGULAIR) 10 MG tablet Take 1 tablet (10 mg total) by mouth at bedtime.  . norethindrone (MICRONOR,CAMILA,ERRIN) 0.35 MG tablet Take 1 tablet (0.35 mg total) by mouth daily.  Marland Kitchen nystatin (MYCOSTATIN) 100000 UNIT/ML suspension Take 5 mLs by mouth 4 (four) times daily. 10 days dose started on 02-25-17  . oxyCODONE-acetaminophen (PERCOCET/ROXICET) 5-325 MG per tablet Take 1 tablet by mouth every 6 (six) hours as needed for moderate pain.   Marland Kitchen Phendimetrazine Tartrate 105 MG CP24 Take 105 mg by mouth daily.  Marland Kitchen  phentermine (ADIPEX-P) 37.5 MG tablet Take 1 tablet (37.5 mg total) by mouth daily before breakfast.  . Polyethyl Glycol-Propyl Glycol (SYSTANE ULTRA) 0.4-0.3 % SOLN Place 1 drop into both eyes daily as needed (for dry eyes).   . predniSONE (DELTASONE) 10 MG tablet Take 1 tablet (10 mg total) by mouth daily with breakfast.  . tacrolimus (PROTOPIC) 0.1 % ointment Apply 1 application topically daily.  Marland Kitchen tiZANidine (ZANAFLEX) 4 MG tablet Take 4 mg by mouth every 8 (eight) hours as needed for muscle spasms.  Marland Kitchen tretinoin (RETIN-A) 0.05 % cream Apply 1 application topically at bedtime.   .  triamcinolone cream (KENALOG) 0.1 % Apply 1 application topically 2 (two) times daily as needed (outbreaks).   . Vitamin D, Ergocalciferol, (DRISDOL) 50000 units CAPS capsule Take 50,000 Units by mouth every 7 (seven) days. On wednesdays  . clindamycin (CLEOCIN) 300 MG capsule Take 1 capsule (300 mg total) by mouth 3 (three) times daily.  . predniSONE (DELTASONE) 10 MG tablet Use per dose pack   No facility-administered encounter medications on file as of 07/29/2018.     Medical History: Past Medical History:  Diagnosis Date  . Anxiety   . Arthritis    rheumo possible  . Chiari malformation   . Depression   . Dizziness   . Headache(784.0)   . History of kidney stones   . Hypertension   . Lupus ([place])   . Lupus ([place])   . Nerve damage    NECK  . PONV (postoperative nausea and vomiting)    nausea only  . TMJ arthritis      Vital Signs: BP 133/83   Temp 98.5 F (36.9 C)   Resp 16   Ht 5\' 6"  (1.676 m)   Wt 244 lb (110.7 kg)   SpO2 96%   BMI 39.38 kg/m   Review of Systems  Constitutional: Negative for chills, fatigue and unexpected weight change.  HENT: Positive for postnasal drip, rhinorrhea, sinus pressure, sinus pain and sore throat. Negative for congestion and sneezing.   Eyes: Negative for photophobia, pain and redness.  Respiratory: Positive for cough. Negative for chest tightness and shortness of breath.   Cardiovascular: Negative for chest pain and palpitations.  Gastrointestinal: Negative for abdominal pain, constipation, diarrhea, nausea and vomiting.  Endocrine: Negative.   Genitourinary: Negative for dysuria and frequency.  Musculoskeletal: Negative for arthralgias, back pain, joint swelling and neck pain.  Skin: Negative for rash.  Allergic/Immunologic: Negative.   Neurological: Negative for tremors and numbness.  Hematological: Negative for adenopathy. Does not bruise/bleed easily.  Psychiatric/Behavioral: Negative for behavioral problems and sleep  disturbance. The patient is not nervous/anxious.     Physical Exam  Constitutional: She is oriented to person, place, and time. She appears well-developed and well-nourished. No distress.  HENT:  Head: Normocephalic and atraumatic.  Mouth/Throat: Oropharynx is clear and moist. No oropharyngeal exudate.  Eyes: Pupils are equal, round, and reactive to light. EOM are normal.  Neck: Normal range of motion. Neck supple. No JVD present. No tracheal deviation present. No thyromegaly present.  Cardiovascular: Normal rate, regular rhythm and normal heart sounds. Exam reveals no gallop and no friction rub.  No murmur heard. Pulmonary/Chest: Effort normal and breath sounds normal. No respiratory distress. She has no wheezes. She has no rales. She exhibits no tenderness.  Abdominal: Soft. There is no tenderness. There is no guarding.  Musculoskeletal: Normal range of motion.  Lymphadenopathy:    She has no cervical adenopathy.  Neurological: She is  alert and oriented to person, place, and time. No cranial nerve deficit.  Skin: Skin is warm and dry. She is not diaphoretic.  Psychiatric: She has a normal mood and affect. Her behavior is normal. Judgment and thought content normal.  Nursing note and vitals reviewed.  Assessment/Plan: 1. Acute non-recurrent frontal sinusitis Encouraged patient to continue using Flonase.   - predniSONE (DELTASONE) 10 MG tablet; Use per dose pack  Dispense: 21 tablet; Refill: 0 - clindamycin (CLEOCIN) 300 MG capsule; Take 1 capsule (300 mg total) by mouth 3 (three) times daily.  Dispense: 21 capsule; Refill: 0  2. Essential hypertension Stable, No issues.   3. Lupus ([place]) Pt has body pain, she reports this is normal for her when sick.  Steroids given to help with inflammation.   General Counseling: Destry verbalizes understanding of the findings of todays visit and agrees with plan of treatment. I have discussed any further diagnostic evaluation that may be needed or  ordered today. We also reviewed her medications today. she has been encouraged to call the office with any questions or concerns that should arise related to todays visit.  Meds ordered this encounter  Medications  . predniSONE (DELTASONE) 10 MG tablet    Sig: Use per dose pack    Dispense:  21 tablet    Refill:  0  . clindamycin (CLEOCIN) 300 MG capsule    Sig: Take 1 capsule (300 mg total) by mouth 3 (three) times daily.    Dispense:  21 capsule    Refill:  0    Time spent: 25 Minutes  This patient was seen by Orson Gear AGNP-C in Collaboration with Dr Lavera Guise as a part of collaborative care agreement

## 2018-07-29 NOTE — Patient Instructions (Signed)

## 2018-08-05 ENCOUNTER — Encounter: Payer: Self-pay | Admitting: Adult Health

## 2018-08-05 ENCOUNTER — Ambulatory Visit (INDEPENDENT_AMBULATORY_CARE_PROVIDER_SITE_OTHER): Payer: Medicare Other | Admitting: Adult Health

## 2018-08-05 VITALS — BP 118/82 | HR 97 | Temp 98.3°F | Resp 16 | Ht 66.0 in | Wt 245.0 lb

## 2018-08-05 DIAGNOSIS — M329 Systemic lupus erythematosus, unspecified: Secondary | ICD-10-CM

## 2018-08-05 DIAGNOSIS — J0191 Acute recurrent sinusitis, unspecified: Secondary | ICD-10-CM

## 2018-08-05 DIAGNOSIS — G935 Compression of brain: Secondary | ICD-10-CM | POA: Diagnosis not present

## 2018-08-05 NOTE — Patient Instructions (Signed)

## 2018-08-05 NOTE — Progress Notes (Signed)
[facility] [place], [place] [address]  Internal MEDICINE  Office Visit Note  Patient Name: Morgan Kent  [id number]  [id number]  Date of Service: 08/08/2018  Chief Complaint  Patient presents with  . Sinusitis    still having a dry cough in middle night ,pressure in face   . Ear Pain    left ear      HPI Pt is here for a sick visit.  Pt just completed course of clindamycin and a prednisone dose pack.  She reports she felt great on the antibiotics, and as soon as they were complete she started feeling face pressure, and headaches with night time cough again. She denies fever, or Sob.    Current Medication:  Outpatient Encounter Medications as of 08/05/2018  Medication Sig  . adapalene (DIFFERIN) 0.1 % gel Apply 1 application topically at bedtime. Apply to forehead and chin  . albuterol (PROVENTIL HFA;VENTOLIN HFA) 108 (90 Base) MCG/ACT inhaler Inhale 2 puffs into the lungs daily.  Marland Kitchen antiseptic oral rinse (BIOTENE) LIQD 15 mLs by Mouth Rinse route 3 (three) times daily.   . Budesonide (PULMICORT FLEXHALER) 90 MCG/ACT inhaler Inhale 2 puffs into the lungs 2 (two) times daily.  . carvedilol (COREG) 25 MG tablet TAKE 1 TABLET BY MOUTH TWICE A DAY  . cetirizine (ZYRTEC) 10 MG tablet Take 1 tablet (10 mg total) by mouth daily.  . clindamycin (CLEOCIN T) 1 % lotion Apply 1 application topically 2 (two) times daily.   . clobetasol (TEMOVATE) 0.05 % external solution Apply 1 application topically 2 (two) times daily as needed. Apply to scalp  . diclofenac sodium (VOLTAREN) 1 % GEL Apply 1 application topically 2 (two) times daily as needed (for pain).  . fluticasone (FLONASE) 50 MCG/ACT nasal spray Place 1 spray into both nostrils at bedtime.  . gabapentin (NEURONTIN) 300 MG capsule Take 1 capsule (300 mg total) by mouth 3 (three) times daily.  . hydrochlorothiazide (HYDRODIURIL) 25 MG tablet TAKE 1 TABLET BY MOUTH EVERY DAY  . hydroxychloroquine (PLAQUENIL)  200 MG tablet Take 200 mg by mouth daily.   Marland Kitchen KETOCONAZOLE, TOPICAL, 1 % SHAM Apply 1 application topically every other day.   . lisinopril (PRINIVIL,ZESTRIL) 5 MG tablet TAKE 1 TABLET BY MOUTH ONCE DAILY  . meloxicam (MOBIC) 7.5 MG tablet Take 1 tablet (7.5 mg total) by mouth 2 (two) times daily.  . mometasone (ELOCON) 0.1 % ointment Apply 1 application topically 2 (two) times daily as needed (lupus outbreaks).  . montelukast (SINGULAIR) 10 MG tablet Take 1 tablet (10 mg total) by mouth at bedtime.  . norethindrone (MICRONOR,CAMILA,ERRIN) 0.35 MG tablet Take 1 tablet (0.35 mg total) by mouth daily.  Marland Kitchen nystatin (MYCOSTATIN) 100000 UNIT/ML suspension Take 5 mLs by mouth 4 (four) times daily. 10 days dose started on 02-25-17  . oxyCODONE-acetaminophen (PERCOCET/ROXICET) 5-325 MG per tablet Take 1 tablet by mouth every 6 (six) hours as needed for moderate pain.   Vladimir Faster Glycol-Propyl Glycol (SYSTANE ULTRA) 0.4-0.3 % SOLN Place 1 drop into both eyes daily as needed (for dry eyes).   . tacrolimus (PROTOPIC) 0.1 % ointment Apply 1 application topically daily.  Marland Kitchen tiZANidine (ZANAFLEX) 4 MG tablet Take 4 mg by mouth every 8 (eight) hours as needed for muscle spasms.  Marland Kitchen tretinoin (RETIN-A) 0.05 % cream Apply 1 application topically at bedtime.   . triamcinolone cream (KENALOG) 0.1 % Apply 1 application topically 2 (two) times daily as needed (outbreaks).   Marland Kitchen  Vitamin D, Ergocalciferol, (DRISDOL) 50000 units CAPS capsule Take 50,000 Units by mouth every 7 (seven) days. On wednesdays  . clindamycin (CLEOCIN) 300 MG capsule Take 1 capsule (300 mg total) by mouth 3 (three) times daily. (Patient not taking: Reported on 08/05/2018)  . methylPREDNISolone (MEDROL) 4 MG tablet 6 day dose pack - take as directed for 6 days (Patient not taking: Reported on 08/05/2018)  . Phendimetrazine Tartrate 105 MG CP24 Take 105 mg by mouth daily.  . phentermine (ADIPEX-P) 37.5 MG tablet Take 1 tablet (37.5 mg total) by mouth  daily before breakfast. (Patient not taking: Reported on 08/05/2018)  . [DISCONTINUED] clarithromycin (BIAXIN) 500 MG tablet Take 1 tablet (500 mg total) by mouth 2 (two) times daily. (Patient not taking: Reported on 08/05/2018)  . [DISCONTINUED] doxycycline (VIBRA-TABS) 100 MG tablet Take 1 tablet (100 mg total) by mouth 2 (two) times daily. (Patient not taking: Reported on 08/05/2018)  . [DISCONTINUED] predniSONE (DELTASONE) 10 MG tablet Take 1 tablet (10 mg total) by mouth daily with breakfast. (Patient not taking: Reported on 08/05/2018)  . [DISCONTINUED] predniSONE (DELTASONE) 10 MG tablet Use per dose pack (Patient not taking: Reported on 08/05/2018)   No facility-administered encounter medications on file as of 08/05/2018.       Medical History: Past Medical History:  Diagnosis Date  . Anxiety   . Arthritis    rheumo possible  . Chiari malformation   . Depression   . Dizziness   . Headache(784.0)   . History of kidney stones   . Hypertension   . Lupus ([place])   . Lupus ([place])   . Nerve damage    NECK  . PONV (postoperative nausea and vomiting)    nausea only  . TMJ arthritis      Vital Signs: BP 118/82   Pulse 97   Temp 98.3 F (36.8 C)   Resp 16   Ht 5\' 6"  (1.676 m)   Wt 245 lb (111.1 kg)   SpO2 95%   BMI 39.54 kg/m    Review of Systems  Constitutional: Negative for chills, fatigue, fever and unexpected weight change.  HENT: Positive for ear pain, postnasal drip, sinus pressure, sinus pain and sore throat. Negative for congestion, rhinorrhea and sneezing.   Eyes: Negative for photophobia, pain and redness.  Respiratory: Negative for cough, chest tightness and shortness of breath.   Cardiovascular: Negative for chest pain and palpitations.  Gastrointestinal: Negative for abdominal pain, constipation, diarrhea, nausea and vomiting.  Endocrine: Negative.   Genitourinary: Negative for dysuria and frequency.  Musculoskeletal: Negative for arthralgias, back pain, joint  swelling and neck pain.  Skin: Negative for rash.  Allergic/Immunologic: Negative.   Neurological: Negative for tremors and numbness.  Hematological: Negative for adenopathy. Does not bruise/bleed easily.  Psychiatric/Behavioral: Negative for behavioral problems and sleep disturbance. The patient is not nervous/anxious.     Physical Exam  Constitutional: She is oriented to person, place, and time. She appears well-developed and well-nourished. No distress.  HENT:  Head: Normocephalic and atraumatic.  Mouth/Throat: Oropharynx is clear and moist. No oropharyngeal exudate.  Eyes: Pupils are equal, round, and reactive to light. EOM are normal.  Neck: Normal range of motion. Neck supple. No JVD present. No tracheal deviation present. No thyromegaly present.  Cardiovascular: Normal rate, regular rhythm and normal heart sounds. Exam reveals no gallop and no friction rub.  No murmur heard. Pulmonary/Chest: Effort normal and breath sounds normal. No respiratory distress. She has no wheezes. She has no rales. She  exhibits no tenderness.  Abdominal: Soft. There is no tenderness. There is no guarding.  Musculoskeletal: Normal range of motion.  Lymphadenopathy:    She has no cervical adenopathy.  Neurological: She is alert and oriented to person, place, and time. No cranial nerve deficit.  Skin: Skin is warm and dry. She is not diaphoretic.  Psychiatric: She has a normal mood and affect. Her behavior is normal. Judgment and thought content normal.  Nursing note and vitals reviewed.  Assessment/Plan: 1. Acute recurrent sinusitis, unspecified location Continue allergy medications as discussed.  Use OTC decongestant, once cleared by pain management.   2. Chiari malformation type I ([place]) Stable, sees neurology next month for follow up.  3. Lupus ([facility]) Stable, no exacerbations at this time.  General Counseling: Morgan Kent verbalizes understanding of the findings of todays visit and agrees with plan of  treatment. I have discussed any further diagnostic evaluation that may be needed or ordered today. We also reviewed her medications today. she has been encouraged to call the office with any questions or concerns that should arise related to todays visit.   No orders of the defined types were placed in this encounter.   No orders of the defined types were placed in this encounter.   Time spent: 25 Minutes  This patient was seen by Orson Gear AGNP-C in Collaboration with Dr Lavera Guise as a part of collaborative care agreement

## 2018-08-11 DIAGNOSIS — L930 Discoid lupus erythematosus: Secondary | ICD-10-CM | POA: Diagnosis not present

## 2018-08-11 DIAGNOSIS — E669 Obesity, unspecified: Secondary | ICD-10-CM | POA: Diagnosis not present

## 2018-08-11 DIAGNOSIS — Q0700 Arnold-Chiari syndrome without spina bifida or hydrocephalus: Secondary | ICD-10-CM | POA: Diagnosis not present

## 2018-09-07 ENCOUNTER — Encounter: Payer: Self-pay | Admitting: Nurse Practitioner

## 2018-09-07 ENCOUNTER — Ambulatory Visit (INDEPENDENT_AMBULATORY_CARE_PROVIDER_SITE_OTHER): Payer: Medicare Other | Admitting: Nurse Practitioner

## 2018-09-07 VITALS — BP 126/75 | HR 87 | Resp 16 | Ht 66.0 in | Wt 247.0 lb

## 2018-09-07 DIAGNOSIS — E559 Vitamin D deficiency, unspecified: Secondary | ICD-10-CM | POA: Diagnosis not present

## 2018-09-07 DIAGNOSIS — Z0001 Encounter for general adult medical examination with abnormal findings: Secondary | ICD-10-CM | POA: Diagnosis not present

## 2018-09-07 DIAGNOSIS — R7301 Impaired fasting glucose: Secondary | ICD-10-CM | POA: Diagnosis not present

## 2018-09-07 DIAGNOSIS — R5383 Other fatigue: Secondary | ICD-10-CM | POA: Insufficient documentation

## 2018-09-07 DIAGNOSIS — I10 Essential (primary) hypertension: Secondary | ICD-10-CM

## 2018-09-07 NOTE — Progress Notes (Signed)
[facility] [place], [place] [address]  Internal MEDICINE  Office Visit Note  Patient Name: Morgan Kent  [id number]  [id number]  Date of Service: 09/07/2018  Chief Complaint  Patient presents with  . Medical Management of Chronic Issues    4 month follow up.   . Pain    pt believes something flew inside of her ear on friday and have been having ear pain    The patient states she is having bilateral ear pain. Has improved since her last visit, but has not resolved. She states that she has appointment with Dr. Arnoldo Morale, who has previously done surgery to repair her Chiari Malformation. She continues to have headaches and neck pain which cannot be explained by xrays or other images. Continues to see her skin specialist as discoid lupus continues to be uncontrolled.  She has been off of weight loss medication because she has been sick, off and on, recently.  Has been treated with two rounds antibiotics. Has had a weight gain of five pounds over the past five months. Her pain clinic is aware of the recent weight gain and is concerned about her developing diabetes. She is due to have routine, fasting blood work done, which we can also determine diabetic status. Urine sample at her CPE in June, 2018 was negative for glucose. Her last BMP, non-fasting, was 95.       Current Medication: Outpatient Encounter Medications as of 09/07/2018  Medication Sig  . adapalene (DIFFERIN) 0.1 % gel Apply 1 application topically at bedtime. Apply to forehead and chin  . albuterol (PROVENTIL HFA;VENTOLIN HFA) 108 (90 Base) MCG/ACT inhaler Inhale 2 puffs into the lungs daily.  Marland Kitchen antiseptic oral rinse (BIOTENE) LIQD 15 mLs by Mouth Rinse route 3 (three) times daily.   . Budesonide (PULMICORT FLEXHALER) 90 MCG/ACT inhaler Inhale 2 puffs into the lungs 2 (two) times daily.  . carvedilol (COREG) 25 MG tablet TAKE 1 TABLET BY MOUTH TWICE A DAY  . cetirizine (ZYRTEC) 10 MG tablet Take 1  tablet (10 mg total) by mouth daily.  . clindamycin (CLEOCIN T) 1 % lotion Apply 1 application topically 2 (two) times daily.   . clobetasol (TEMOVATE) 0.05 % external solution Apply 1 application topically 2 (two) times daily as needed. Apply to scalp  . diclofenac sodium (VOLTAREN) 1 % GEL Apply 1 application topically 2 (two) times daily as needed (for pain).  . fluticasone (FLONASE) 50 MCG/ACT nasal spray Place 1 spray into both nostrils at bedtime.  . gabapentin (NEURONTIN) 300 MG capsule Take 1 capsule (300 mg total) by mouth 3 (three) times daily.  . hydrochlorothiazide (HYDRODIURIL) 25 MG tablet TAKE 1 TABLET BY MOUTH EVERY DAY  . hydroxychloroquine (PLAQUENIL) 200 MG tablet Take 200 mg by mouth daily.   Marland Kitchen KETOCONAZOLE, TOPICAL, 1 % SHAM Apply 1 application topically every other day.   . lisinopril (PRINIVIL,ZESTRIL) 5 MG tablet TAKE 1 TABLET BY MOUTH ONCE DAILY  . meloxicam (MOBIC) 7.5 MG tablet Take 1 tablet (7.5 mg total) by mouth 2 (two) times daily.  . mometasone (ELOCON) 0.1 % ointment Apply 1 application topically 2 (two) times daily as needed (lupus outbreaks).  . montelukast (SINGULAIR) 10 MG tablet Take 1 tablet (10 mg total) by mouth at bedtime.  . norethindrone (MICRONOR,CAMILA,ERRIN) 0.35 MG tablet Take 1 tablet (0.35 mg total) by mouth daily.  Marland Kitchen nystatin (MYCOSTATIN) 100000 UNIT/ML suspension Take 5 mLs by mouth 4 (four) times daily. 10 days dose started  on 02-25-17  . oxyCODONE-acetaminophen (PERCOCET/ROXICET) 5-325 MG per tablet Take 1 tablet by mouth every 6 (six) hours as needed for moderate pain.   Marland Kitchen Phendimetrazine Tartrate 105 MG CP24 Take 105 mg by mouth daily.  Vladimir Faster Glycol-Propyl Glycol (SYSTANE ULTRA) 0.4-0.3 % SOLN Place 1 drop into both eyes daily as needed (for dry eyes).   . tacrolimus (PROTOPIC) 0.1 % ointment Apply 1 application topically daily.  Marland Kitchen tiZANidine (ZANAFLEX) 4 MG tablet Take 4 mg by mouth every 8 (eight) hours as needed for muscle spasms.  Marland Kitchen  tretinoin (RETIN-A) 0.05 % cream Apply 1 application topically at bedtime.   . triamcinolone cream (KENALOG) 0.1 % Apply 1 application topically 2 (two) times daily as needed (outbreaks).   . Vitamin D, Ergocalciferol, (DRISDOL) 50000 units CAPS capsule Take 50,000 Units by mouth every 7 (seven) days. On wednesdays  . phentermine (ADIPEX-P) 37.5 MG tablet Take 1 tablet (37.5 mg total) by mouth daily before breakfast. (Patient not taking: Reported on 09/07/2018)  . [DISCONTINUED] clindamycin (CLEOCIN) 300 MG capsule Take 1 capsule (300 mg total) by mouth 3 (three) times daily. (Patient not taking: Reported on 08/05/2018)  . [DISCONTINUED] methylPREDNISolone (MEDROL) 4 MG tablet 6 day dose pack - take as directed for 6 days (Patient not taking: Reported on 08/05/2018)   No facility-administered encounter medications on file as of 09/07/2018.     Surgical History: Past Surgical History:  Procedure Laterality Date  . carpel tunnel  Left 2014  . CRANIOTOMY  14,06   chiari  . DENTAL SURGERY    . HERNIA REPAIR     umbilical  . PLACEMENT OF LUMBAR DRAIN N/A 07/07/2014   Procedure: PLACEMENT OF LUMBAR DRAIN AND CLOSURE OF CERVICAL INCISION;  Surgeon: Newman Pies, MD;  Location: [place] [facility];  Service: Neurosurgery;  Laterality: N/A;  . SUBOCCIPITAL CRANIECTOMY CERVICAL LAMINECTOMY N/A 09/29/2013   Procedure: SUBOCCIPITAL CRANIECTOMY CERVICAL LAMINECTOMY/DURAPLASTY;  Surgeon: Ophelia Charter, MD;  Location: [place] [facility];  Service: Neurosurgery;  Laterality: N/A;  posterior  . SUBOCCIPITAL CRANIECTOMY CERVICAL LAMINECTOMY N/A 06/23/2014   Procedure: SUBOCCIPITAL CRANIECTOMY CERVICAL LAMINECTOMY/DURAPLASTY;  Surgeon: Newman Pies, MD;  Location: [place] [facility];  Service: Neurosurgery;  Laterality: N/A;  suboccipital craniectomy with cervical laminectomy and duraplasty    Medical History: Past Medical History:  Diagnosis Date  . Anxiety   . Arthritis    rheumo possible  . Chiari malformation    . Depression   . Dizziness   . Headache(784.0)   . History of kidney stones   . Hypertension   . Lupus ([place])   . Lupus ([place])   . Nerve damage    NECK  . PONV (postoperative nausea and vomiting)    nausea only  . TMJ arthritis     Family History: Family History  Problem Relation Age of Onset  . Heart disease Father   . Hypertension Father   . Hypertension Mother   . Thyroid disease Mother   . Asthma Unknown   . Depression Unknown   . Diabetes Unknown   . Migraines Unknown   . Stroke Unknown     Social History   Socioeconomic History  . Marital status: Single    Spouse name: Not on file  . Number of children: Not on file  . Years of education: Not on file  . Highest education level: Not on file  Occupational History  . Not on file  Social Needs  . Financial resource strain: Not on file  .  Food insecurity:    Worry: Not on file    Inability: Not on file  . Transportation needs:    Medical: Not on file    Non-medical: Not on file  Tobacco Use  . Smoking status: Former Smoker    Types: Cigarettes  . Smokeless tobacco: Never Used  . Tobacco comment: stopped in 1999  Substance and Sexual Activity  . Alcohol use: No    Alcohol/week: 0.0 standard drinks  . Drug use: No  . Sexual activity: Not on file  Lifestyle  . Physical activity:    Days per week: Not on file    Minutes per session: Not on file  . Stress: Not on file  Relationships  . Social connections:    Talks on phone: Not on file    Gets together: Not on file    Attends religious service: Not on file    Active member of club or organization: Not on file    Attends meetings of clubs or organizations: Not on file    Relationship status: Not on file  . Intimate partner violence:    Fear of current or ex partner: Not on file    Emotionally abused: Not on file    Physically abused: Not on file    Forced sexual activity: Not on file  Other Topics Concern  . Not on file  Social History Narrative    Lives with 3 children in 2 story home.  Unemployed.  Trying to get disability.  Education: high school.      Review of Systems  Constitutional: Positive for fatigue. Negative for chills and unexpected weight change.       Weight gain 5 pounds over past 6 months.   HENT: Positive for ear pain. Negative for congestion, postnasal drip, rhinorrhea, sneezing and sore throat.   Eyes: Negative for redness.  Respiratory: Negative for cough, chest tightness, shortness of breath and wheezing.   Cardiovascular: Negative for chest pain and palpitations.  Gastrointestinal: Negative for abdominal pain, constipation, diarrhea, nausea and vomiting.  Endocrine: Negative for cold intolerance, heat intolerance, polydipsia, polyphagia and polyuria.  Musculoskeletal: Positive for arthralgias, back pain, myalgias and neck pain. Negative for joint swelling.       Regularly seeing pain management   Skin: Positive for rash.       Moderate discoid lupus.  Allergic/Immunologic: Positive for environmental allergies.  Neurological: Positive for headaches. Negative for tremors and numbness.  Hematological: Negative for adenopathy. Does not bruise/bleed easily.  Psychiatric/Behavioral: Negative for behavioral problems (Depression), sleep disturbance and suicidal ideas. The patient is not nervous/anxious.     Vital Signs: BP 126/75 (BP Location: Right Arm, Patient Position: Sitting, Cuff Size: Large)   Pulse 87   Resp 16   Ht 5\' 6"  (1.676 m)   Wt 247 lb (112 kg)   SpO2 97%   BMI 39.87 kg/m    Physical Exam  Constitutional: She is oriented to person, place, and time. She appears well-developed and well-nourished. No distress.  HENT:  Head: Normocephalic and atraumatic.  Right Ear: External ear normal.  Left Ear: External ear normal.  Nose: Nose normal.  Mouth/Throat: Oropharynx is clear and moist. No oropharyngeal exudate.  Eyes: Pupils are equal, round, and reactive to light. EOM are normal.  Neck:  Normal range of motion. Neck supple. No JVD present. No tracheal deviation present. No thyromegaly present.  Cardiovascular: Normal rate, regular rhythm and normal heart sounds. Exam reveals no gallop and no friction rub.  No  murmur heard. Pulmonary/Chest: Effort normal and breath sounds normal. No respiratory distress. She has no wheezes. She has no rales. She exhibits no tenderness.  Abdominal: Soft. Bowel sounds are normal. There is no tenderness.  Lymphadenopathy:    She has no cervical adenopathy.  Neurological: She is alert and oriented to person, place, and time. No cranial nerve deficit.  Skin: Skin is warm and dry. She is not diaphoretic.  Psychiatric: She has a normal mood and affect. Her behavior is normal. Judgment and thought content normal.  Nursing note and vitals reviewed.  Assessment/Plan: 1. Essential hypertension Stable. Continue bp medication as prescribed. Routine, fasting labs ordered.  - CBC with Differential/Platelet - Comprehensive metabolic panel - Lipid panel  2. Impaired fasting glucose - HgB A1c  3. Morbid obesity ([place]) Will continue to hold weight loss medication for now. Will address again at next visit.   4. Other fatigue - T4, free - TSH  5. Vitamin D deficiency - Vitamin D 1,25 dihydroxy   General Counseling: Sul[place] verbalizes understanding of the findings of todays visit and agrees with plan of treatment. I have discussed any further diagnostic evaluation that may be needed or ordered today. We also reviewed her medications today. she has been encouraged to call the office with any questions or concerns that should arise related to todays visit.  Hypertension Counseling:   The following hypertensive lifestyle modification were recommended and discussed:  1. Limiting alcohol intake to less than 1 oz/day of ethanol:(24 oz of beer or 8 oz of wine or 2 oz of 100-proof whiskey). 2. Take baby ASA 81 mg daily. 3. Importance of regular aerobic  exercise and losing weight. 4. Reduce dietary saturated fat and cholesterol intake for overall cardiovascular health. 5. Maintaining adequate dietary potassium, calcium, and magnesium intake. 6. Regular monitoring of the blood pressure. 7. Reduce sodium intake to less than 100 mmol/day (less than 2.3 gm of sodium or less than 6 gm of sodium choride)   This patient was seen by [place] with Dr Lavera Guise as a part of collaborative care agreement  Orders Placed This Encounter  Procedures  . CBC with Differential/Platelet  . Comprehensive metabolic panel  . T4, free  . TSH  . Lipid panel  . Vitamin D 1,25 dihydroxy  . HgB A1c     Time spent: 25 Minutes      Dr Lavera Guise Internal medicine

## 2018-09-08 DIAGNOSIS — Z6841 Body Mass Index (BMI) 40.0 and over, adult: Secondary | ICD-10-CM | POA: Diagnosis not present

## 2018-09-08 DIAGNOSIS — G935 Compression of brain: Secondary | ICD-10-CM | POA: Diagnosis not present

## 2018-09-11 ENCOUNTER — Other Ambulatory Visit: Payer: Self-pay | Admitting: Nurse Practitioner

## 2018-09-25 ENCOUNTER — Ambulatory Visit (INDEPENDENT_AMBULATORY_CARE_PROVIDER_SITE_OTHER): Payer: Medicare Other | Admitting: Adult Health

## 2018-09-25 ENCOUNTER — Encounter: Payer: Self-pay | Admitting: Adult Health

## 2018-09-25 ENCOUNTER — Telehealth: Payer: Self-pay | Admitting: Adult Health

## 2018-09-25 VITALS — BP 136/88 | HR 93 | Temp 98.6°F | Resp 16 | Ht 66.0 in | Wt 247.0 lb

## 2018-09-25 DIAGNOSIS — B379 Candidiasis, unspecified: Secondary | ICD-10-CM

## 2018-09-25 DIAGNOSIS — J0110 Acute frontal sinusitis, unspecified: Secondary | ICD-10-CM | POA: Diagnosis not present

## 2018-09-25 DIAGNOSIS — J029 Acute pharyngitis, unspecified: Secondary | ICD-10-CM

## 2018-09-25 DIAGNOSIS — I10 Essential (primary) hypertension: Secondary | ICD-10-CM

## 2018-09-25 LAB — POCT RAPID STREP A (OFFICE): RAPID STREP A SCREEN: NEGATIVE

## 2018-09-25 MED ORDER — CLARITHROMYCIN 250 MG PO TABS: 250.0000 mg | ORAL_TABLET | Freq: Two times a day (BID) | ORAL | 0 refills | Status: DC

## 2018-09-25 MED ORDER — FIRST-DUKES MOUTHWASH MT SUSP: OROMUCOSAL | 0 refills | Status: DC

## 2018-09-25 NOTE — Telephone Encounter (Signed)
Phone In magic mouthwash

## 2018-09-25 NOTE — Progress Notes (Signed)
[facility] [place], [place] [address]  Internal MEDICINE  Office Visit Note  Patient Name: Morgan Kent  [id number]  [id number]  Date of Service: 09/25/2018  Chief Complaint  Patient presents with  . Sore Throat  . Thrush     HPI Pt is here for a sick visit.  Pt here reporting sore throat x 4 days.  She also has runny nose, and sinus pressure.  She denies cough/fever chills.  She reports she is been using OTC medications with mild relief.  She denies any sick contacts.     Current Medication:  Outpatient Encounter Medications as of 09/25/2018  Medication Sig  . adapalene (DIFFERIN) 0.1 % gel Apply 1 application topically at bedtime. Apply to forehead and chin  . albuterol (PROVENTIL HFA;VENTOLIN HFA) 108 (90 Base) MCG/ACT inhaler Inhale 2 puffs into the lungs daily.  Marland Kitchen antiseptic oral rinse (BIOTENE) LIQD 15 mLs by Mouth Rinse route 3 (three) times daily.   . Budesonide (PULMICORT FLEXHALER) 90 MCG/ACT inhaler Inhale 2 puffs into the lungs 2 (two) times daily.  . carvedilol (COREG) 25 MG tablet TAKE 1 TABLET BY MOUTH TWICE A DAY  . cetirizine (ZYRTEC) 10 MG tablet Take 1 tablet (10 mg total) by mouth daily.  . clobetasol (TEMOVATE) 0.05 % external solution Apply 1 application topically 2 (two) times daily as needed. Apply to scalp  . diclofenac sodium (VOLTAREN) 1 % GEL Apply 1 application topically 2 (two) times daily as needed (for pain).  . fluticasone (FLONASE) 50 MCG/ACT nasal spray Place 1 spray into both nostrils at bedtime.  . gabapentin (NEURONTIN) 300 MG capsule TAKE 1 CAPSULE(300 MG) BY MOUTH THREE TIMES DAILY  . hydrochlorothiazide (HYDRODIURIL) 25 MG tablet TAKE 1 TABLET BY MOUTH EVERY DAY  . hydroxychloroquine (PLAQUENIL) 200 MG tablet Take 200 mg by mouth daily.   Marland Kitchen KETOCONAZOLE, TOPICAL, 1 % SHAM Apply 1 application topically every other day.   . lisinopril (PRINIVIL,ZESTRIL) 5 MG tablet TAKE 1 TABLET BY MOUTH ONCE DAILY  .  meloxicam (MOBIC) 7.5 MG tablet Take 1 tablet (7.5 mg total) by mouth 2 (two) times daily.  . mometasone (ELOCON) 0.1 % ointment Apply 1 application topically 2 (two) times daily as needed (lupus outbreaks).  . montelukast (SINGULAIR) 10 MG tablet Take 1 tablet (10 mg total) by mouth at bedtime.  . norethindrone (MICRONOR,CAMILA,ERRIN) 0.35 MG tablet Take 1 tablet (0.35 mg total) by mouth daily.  Marland Kitchen nystatin (MYCOSTATIN) 100000 UNIT/ML suspension Take 5 mLs by mouth 4 (four) times daily. 10 days dose started on 02-25-17  . oxyCODONE-acetaminophen (PERCOCET/ROXICET) 5-325 MG per tablet Take 1 tablet by mouth every 6 (six) hours as needed for moderate pain.   Marland Kitchen Phendimetrazine Tartrate 105 MG CP24 Take 105 mg by mouth daily.  . phentermine (ADIPEX-P) 37.5 MG tablet Take 1 tablet (37.5 mg total) by mouth daily before breakfast.  . Polyethyl Glycol-Propyl Glycol (SYSTANE ULTRA) 0.4-0.3 % SOLN Place 1 drop into both eyes daily as needed (for dry eyes).   . tacrolimus (PROTOPIC) 0.1 % ointment Apply 1 application topically daily.  Marland Kitchen tiZANidine (ZANAFLEX) 4 MG tablet Take 4 mg by mouth every 8 (eight) hours as needed for muscle spasms.  Marland Kitchen tretinoin (RETIN-A) 0.05 % cream Apply 1 application topically at bedtime.   . triamcinolone cream (KENALOG) 0.1 % Apply 1 application topically 2 (two) times daily as needed (outbreaks).   . Vitamin D, Ergocalciferol, (DRISDOL) 50000 units CAPS capsule Take 50,000 Units by mouth  every 7 (seven) days. On wednesdays  . clarithromycin (BIAXIN) 250 MG tablet Take 1 tablet (250 mg total) by mouth 2 (two) times daily.  . [DISCONTINUED] clindamycin (CLEOCIN T) 1 % lotion Apply 1 application topically 2 (two) times daily.    No facility-administered encounter medications on file as of 09/25/2018.       Medical History: Past Medical History:  Diagnosis Date  . Anxiety   . Arthritis    rheumo possible  . Chiari malformation   . Depression   . Dizziness   .  Headache(784.0)   . History of kidney stones   . Hypertension   . Lupus ([employer])   . Lupus ([place])   . Nerve damage    NECK  . PONV (postoperative nausea and vomiting)    nausea only  . TMJ arthritis      Vital Signs: BP 136/88 (BP Location: Left Arm, Patient Position: Sitting, Cuff Size: Normal)   Pulse 93   Temp 98.6 F (37 C) (Oral)   Resp 16   Ht 5\' 6"  (1.676 m)   Wt 247 lb (112 kg)   SpO2 99%   BMI 39.87 kg/m    Review of Systems  Constitutional: Negative for chills, fatigue, fever and unexpected weight change.  HENT: Positive for postnasal drip, rhinorrhea, sinus pressure, sinus pain and sore throat. Negative for congestion and sneezing.   Eyes: Negative for photophobia, pain and redness.  Respiratory: Negative for cough, chest tightness and shortness of breath.   Cardiovascular: Negative for chest pain and palpitations.  Gastrointestinal: Negative for abdominal pain, constipation, diarrhea, nausea and vomiting.  Endocrine: Negative.   Genitourinary: Negative for dysuria and frequency.  Musculoskeletal: Negative for arthralgias, back pain, joint swelling and neck pain.  Skin: Negative for rash.  Allergic/Immunologic: Negative.   Neurological: Negative for tremors and numbness.  Hematological: Negative for adenopathy. Does not bruise/bleed easily.  Psychiatric/Behavioral: Negative for behavioral problems and sleep disturbance. The patient is not nervous/anxious.     Physical Exam  Constitutional: She is oriented to person, place, and time. She appears well-developed and well-nourished. No distress.  HENT:  Head: Normocephalic and atraumatic.  Mouth/Throat: Uvula is midline and oropharynx is clear and moist. No oropharyngeal exudate or tonsillar abscesses.  Erythema to posterior oral pharynx.  Eyes: Pupils are equal, round, and reactive to light. EOM are normal.  Neck: Normal range of motion. Neck supple. No JVD present. No tracheal deviation present. No thyromegaly  present.  Cardiovascular: Normal rate, regular rhythm and normal heart sounds. Exam reveals no gallop and no friction rub.  No murmur heard. Pulmonary/Chest: Effort normal and breath sounds normal. No respiratory distress. She has no wheezes. She has no rales. She exhibits no tenderness.  Abdominal: Soft. There is no tenderness. There is no guarding.  Musculoskeletal: Normal range of motion.  Lymphadenopathy:    She has no cervical adenopathy.  Neurological: She is alert and oriented to person, place, and time. No cranial nerve deficit.  Skin: Skin is warm and dry. She is not diaphoretic.  Psychiatric: She has a normal mood and affect. Her behavior is normal. Judgment and thought content normal.  Nursing note and vitals reviewed.  Assessment/Plan: 1. Acute non-recurrent frontal sinusitis Patient provided with Biaxin prescription.  Instructed her to return to clinic if symptoms do not resolve with antibiotic. - clarithromycin (BIAXIN) 250 MG tablet; Take 1 tablet (250 mg total) by mouth 2 (two) times daily.  Dispense: 20 tablet; Refill: 0  2. Sore throat  Rapid strep is negative at today's visit. - POCT rapid strep A  3. Essential hypertension Stable, continue current medications as prescribed.  4. Candidiasis Patient provided with prescription for [employer]'s Magic mouthwash as she has oral thrush.  General Counseling: Shelie verbalizes understanding of the findings of todays visit and agrees with plan of treatment. I have discussed any further diagnostic evaluation that may be needed or ordered today. We also reviewed her medications today. she has been encouraged to call the office with any questions or concerns that should arise related to todays visit.   Orders Placed This Encounter  Procedures  . POCT rapid strep A    Meds ordered this encounter  Medications  . clarithromycin (BIAXIN) 250 MG tablet    Sig: Take 1 tablet (250 mg total) by mouth 2 (two) times daily.    Dispense:   20 tablet    Refill:  0    Time spent: 25 Minutes  This patient was seen by Orson Gear AGNP-C in Collaboration with Dr Lavera Guise as a part of collaborative care agreement.  Kendell Bane AGNP-C Internal Medicine

## 2018-09-25 NOTE — Patient Instructions (Signed)

## 2018-09-30 ENCOUNTER — Other Ambulatory Visit: Payer: Self-pay | Admitting: Nurse Practitioner

## 2018-09-30 DIAGNOSIS — Z0001 Encounter for general adult medical examination with abnormal findings: Secondary | ICD-10-CM | POA: Diagnosis not present

## 2018-09-30 DIAGNOSIS — I10 Essential (primary) hypertension: Secondary | ICD-10-CM | POA: Diagnosis not present

## 2018-09-30 DIAGNOSIS — E559 Vitamin D deficiency, unspecified: Secondary | ICD-10-CM | POA: Diagnosis not present

## 2018-09-30 DIAGNOSIS — R7301 Impaired fasting glucose: Secondary | ICD-10-CM | POA: Diagnosis not present

## 2018-09-30 DIAGNOSIS — E860 Dehydration: Secondary | ICD-10-CM | POA: Diagnosis not present

## 2018-10-01 LAB — COMPREHENSIVE METABOLIC PANEL
A/G RATIO: 1.2 (ref 1.2–2.2)
ALT: 7 IU/L (ref 0–32)
AST: 12 IU/L (ref 0–40)
Albumin: 4.4 g/dL (ref 3.5–5.5)
Alkaline Phosphatase: 62 IU/L (ref 39–117)
BILIRUBIN TOTAL: 0.3 mg/dL (ref 0.0–1.2)
BUN/Creatinine Ratio: 13 (ref 9–23)
BUN: 16 mg/dL (ref 6–20)
CHLORIDE: 100 mmol/L (ref 96–106)
CO2: 24 mmol/L (ref 20–29)
Calcium: 9.9 mg/dL (ref 8.7–10.2)
Creatinine, Ser: 1.23 mg/dL — ABNORMAL HIGH (ref 0.57–1.00)
GFR calc Af Amer: 64 mL/min/{1.73_m2} (ref >59)
GFR calc non Af Amer: 56 mL/min/{1.73_m2} — ABNORMAL LOW (ref >59)
GLUCOSE: 96 mg/dL (ref 65–99)
Globulin, Total: 3.7 g/dL (ref 1.5–4.5)
POTASSIUM: 4.5 mmol/L (ref 3.5–5.2)
Sodium: 140 mmol/L (ref 134–144)
TOTAL PROTEIN: 8.1 g/dL (ref 6.0–8.5)

## 2018-10-01 LAB — HGB A1C W/O EAG: HEMOGLOBIN A1C: 5.9 % — AB (ref 4.8–5.6)

## 2018-10-01 LAB — CBC
HEMATOCRIT: 32.7 % — AB (ref 34.0–46.6)
HEMOGLOBIN: 11.1 g/dL (ref 11.1–15.9)
MCH: 28.9 pg (ref 26.6–33.0)
MCHC: 33.9 g/dL (ref 31.5–35.7)
MCV: 85 fL (ref 79–97)
Platelets: 160 10*3/uL (ref 150–450)
RBC: 3.84 x10E6/uL (ref 3.77–5.28)
RDW: 13.9 % (ref 12.3–15.4)
WBC: 6.1 10*3/uL (ref 3.4–10.8)

## 2018-10-01 LAB — LIPID PANEL W/O CHOL/HDL RATIO
Cholesterol, Total: 218 mg/dL — ABNORMAL HIGH (ref 100–199)
HDL: 41 mg/dL (ref >39)
LDL Calculated: 152 mg/dL — ABNORMAL HIGH (ref 0–99)
TRIGLYCERIDES: 127 mg/dL (ref 0–149)
VLDL Cholesterol Cal: 25 mg/dL (ref 5–40)

## 2018-10-01 LAB — VITAMIN D 25 HYDROXY (VIT D DEFICIENCY, FRACTURES): Vit D, 25-Hydroxy: 24.8 ng/mL — ABNORMAL LOW (ref 30.0–100.0)

## 2018-10-01 LAB — TSH: TSH: 0.646 u[IU]/mL (ref 0.450–4.500)

## 2018-10-01 LAB — T4, FREE: Free T4: 1.19 ng/dL (ref 0.82–1.77)

## 2018-10-01 LAB — T3: T3 TOTAL: 126 ng/dL (ref 71–180)

## 2018-10-02 ENCOUNTER — Other Ambulatory Visit: Payer: Self-pay | Admitting: Cardiovascular Disease

## 2018-10-06 DIAGNOSIS — M542 Cervicalgia: Secondary | ICD-10-CM | POA: Diagnosis not present

## 2018-10-06 DIAGNOSIS — M545 Low back pain: Secondary | ICD-10-CM | POA: Diagnosis not present

## 2018-10-06 DIAGNOSIS — I10 Essential (primary) hypertension: Secondary | ICD-10-CM | POA: Diagnosis not present

## 2018-10-06 DIAGNOSIS — M79669 Pain in unspecified lower leg: Secondary | ICD-10-CM | POA: Diagnosis not present

## 2018-10-06 DIAGNOSIS — Z6839 Body mass index (BMI) 39.0-39.9, adult: Secondary | ICD-10-CM | POA: Diagnosis not present

## 2018-10-06 DIAGNOSIS — G9009 Other idiopathic peripheral autonomic neuropathy: Secondary | ICD-10-CM | POA: Diagnosis not present

## 2018-10-06 DIAGNOSIS — E6609 Other obesity due to excess calories: Secondary | ICD-10-CM | POA: Diagnosis not present

## 2018-10-07 DIAGNOSIS — M79661 Pain in right lower leg: Secondary | ICD-10-CM | POA: Diagnosis not present

## 2018-10-07 DIAGNOSIS — M25512 Pain in left shoulder: Secondary | ICD-10-CM | POA: Diagnosis not present

## 2018-10-07 DIAGNOSIS — M79662 Pain in left lower leg: Secondary | ICD-10-CM | POA: Diagnosis not present

## 2018-10-07 DIAGNOSIS — M25511 Pain in right shoulder: Secondary | ICD-10-CM | POA: Diagnosis not present

## 2018-10-07 DIAGNOSIS — G894 Chronic pain syndrome: Secondary | ICD-10-CM | POA: Diagnosis not present

## 2018-10-07 DIAGNOSIS — M542 Cervicalgia: Secondary | ICD-10-CM | POA: Diagnosis not present

## 2018-10-11 ENCOUNTER — Other Ambulatory Visit: Payer: Self-pay | Admitting: Cardiovascular Disease

## 2018-10-11 NOTE — Progress Notes (Signed)
Cardiology Office Note  Date:  10/12/2018   ID:  Morgan Kent, DOB Mar 05, 1981, MRN [id number]  PCP:  Ronnell Freshwater, NP   Chief Complaint  Patient presents with  . other    12 month follow up. Meds reviewed by the pt. verbally. "doing well."    HPI:  Morgan Kent is a pleasant 37 year old woman with  morbid obesity, lupus, dx in 2013 neck surgery With chronic pain down her arms bilaterally,  sleep apnea on nasal CPAP,   EF 60 to 65% in 11/2015, up from 45% in 2016 who presents for follow-up of her symptoms of shortness of breath, cardiomyopathy  Lupus flare up, <1 yr Has skin cream, Skin hurts Periodically on prednisone  Periodic sinus infection Recently treated with ABX 2 weeks, also with thrush  "pain all year round, on and off" Neck, arms, legs Hurts to walk Followed by the pain clinic in [place] Weight continues to run high  Denies any shortness breath or chest pain No leg swelling, no PND or orthopnea  HBA1C 5.9 TOTAL CHOL 218 Cr 1.23  EKG personally reviewed by myself on todays visit Shows normal sinus rhythm rate 84 bpm no significant ST or T wave changes  PMH:   has a past medical history of Anxiety, Arthritis, Chiari malformation, Depression, Dizziness, Headache(784.0), History of kidney stones, Hypertension, Lupus ([place]), Lupus ([place]), Nerve damage, PONV (postoperative nausea and vomiting), and TMJ arthritis.  PSH:    Past Surgical History:  Procedure Laterality Date  . carpel tunnel  Left 2014  . CRANIOTOMY  14,06   chiari  . DENTAL SURGERY    . HERNIA REPAIR     umbilical  . PLACEMENT OF LUMBAR DRAIN N/A 07/07/2014   Procedure: PLACEMENT OF LUMBAR DRAIN AND CLOSURE OF CERVICAL INCISION;  Surgeon: Newman Pies, MD;  Location: [facility];  Service: Neurosurgery;  Laterality: N/A;  . SUBOCCIPITAL CRANIECTOMY CERVICAL LAMINECTOMY N/A 09/29/2013   Procedure: SUBOCCIPITAL CRANIECTOMY CERVICAL LAMINECTOMY/DURAPLASTY;  Surgeon: Ophelia Charter, MD;   Location: [facility];  Service: Neurosurgery;  Laterality: N/A;  posterior  . SUBOCCIPITAL CRANIECTOMY CERVICAL LAMINECTOMY N/A 06/23/2014   Procedure: SUBOCCIPITAL CRANIECTOMY CERVICAL LAMINECTOMY/DURAPLASTY;  Surgeon: Newman Pies, MD;  Location: [facility];  Service: Neurosurgery;  Laterality: N/A;  suboccipital craniectomy with cervical laminectomy and duraplasty    Current Outpatient Medications  Medication Sig Dispense Refill  . adapalene (DIFFERIN) 0.1 % gel Apply 1 application topically at bedtime. Apply to forehead and chin    . albuterol (PROVENTIL HFA;VENTOLIN HFA) 108 (90 Base) MCG/ACT inhaler Inhale 2 puffs into the lungs daily. 3 Inhaler 1  . antiseptic oral rinse (BIOTENE) LIQD 15 mLs by Mouth Rinse route 3 (three) times daily.     . Budesonide (PULMICORT FLEXHALER) 90 MCG/ACT inhaler Inhale 2 puffs into the lungs 2 (two) times daily. 1 Inhaler 3  . carvedilol (COREG) 25 MG tablet TAKE 1 TABLET BY MOUTH TWICE A DAY 180 tablet 0  . cetirizine (ZYRTEC) 10 MG tablet Take 1 tablet (10 mg total) by mouth daily. 30 tablet 5  . clobetasol (TEMOVATE) 0.05 % external solution Apply 1 application topically 2 (two) times daily as needed. Apply to scalp    . diclofenac sodium (VOLTAREN) 1 % GEL Apply 1 application topically 2 (two) times daily as needed (for pain). 300 g 3  . fluticasone (FLONASE) 50 MCG/ACT nasal spray Place 1 spray into both nostrils at bedtime. 48 g 1  . gabapentin (NEURONTIN) 300 MG capsule  TAKE 1 CAPSULE(300 MG) BY MOUTH THREE TIMES DAILY 270 capsule 0  . hydrochlorothiazide (HYDRODIURIL) 25 MG tablet TAKE 1 TABLET BY MOUTH EVERY DAY 90 tablet 0  . hydroxychloroquine (PLAQUENIL) 200 MG tablet Take 200 mg by mouth daily.     Marland Kitchen KETOCONAZOLE, TOPICAL, 1 % SHAM Apply 1 application topically every other day.     . lisinopril (PRINIVIL,ZESTRIL) 5 MG tablet TAKE 1 TABLET BY MOUTH ONCE DAILY 90 tablet 0  . meloxicam (MOBIC) 7.5 MG tablet Take 1 tablet (7.5 mg total) by  mouth 2 (two) times daily. 180 tablet 1  . mometasone (ELOCON) 0.1 % ointment Apply 1 application topically 2 (two) times daily as needed (lupus outbreaks).    . montelukast (SINGULAIR) 10 MG tablet Take 1 tablet (10 mg total) by mouth at bedtime. 90 tablet 3  . norethindrone (MICRONOR,CAMILA,ERRIN) 0.35 MG tablet Take 1 tablet (0.35 mg total) by mouth daily. 3 Package 3  . nystatin (MYCOSTATIN) 100000 UNIT/ML suspension Take 5 mLs by mouth 4 (four) times daily. 10 days dose started on 02-25-17    . oxyCODONE-acetaminophen (PERCOCET/ROXICET) 5-325 MG per tablet Take 1 tablet by mouth every 6 (six) hours as needed for moderate pain.     Vladimir Faster Glycol-Propyl Glycol (SYSTANE ULTRA) 0.4-0.3 % SOLN Place 1 drop into both eyes daily as needed (for dry eyes).     . tacrolimus (PROTOPIC) 0.1 % ointment Apply 1 application topically daily.    Marland Kitchen tiZANidine (ZANAFLEX) 4 MG tablet Take 4 mg by mouth every 8 (eight) hours as needed for muscle spasms.    Marland Kitchen tretinoin (RETIN-A) 0.05 % cream Apply 1 application topically at bedtime.   0  . triamcinolone cream (KENALOG) 0.1 % Apply 1 application topically 2 (two) times daily as needed (outbreaks).   0  . Vitamin D, Ergocalciferol, (DRISDOL) 50000 units CAPS capsule Take 50,000 Units by mouth every 7 (seven) days. On wednesdays  0   No current facility-administered medications for this visit.      Allergies:   Other and Cephalexin   Social History:  The patient  reports that she has quit smoking. Her smoking use included cigarettes. She has never used smokeless tobacco. She reports that she does not drink alcohol or use drugs.   Family History:   family history includes Asthma in her unknown relative; Depression in her unknown relative; Diabetes in her unknown relative; Heart disease in her father; Hypertension in her father and mother; Migraines in her unknown relative; Stroke in her unknown relative; Thyroid disease in her mother.    Review of  Systems: Review of Systems  Constitutional: Negative.   HENT: Negative.   Respiratory: Negative.   Cardiovascular: Negative.   Gastrointestinal: Negative.   Musculoskeletal: Negative.        Chronic arm, neck, leg pain  Neurological: Negative.   Psychiatric/Behavioral: Negative.   All other systems reviewed and are negative.   PHYSICAL EXAM: VS:  BP 120/80 (BP Location: Left Arm, Patient Position: Sitting, Cuff Size: Normal)   Pulse 84   Ht 5\' 6"  (1.676 m)   Wt 251 lb 12 oz (114.2 kg)   BMI 40.63 kg/m  , BMI Body mass index is 40.63 kg/m.  Constitutional:  oriented to person, place, and time. No distress.  Obese HENT:  Head: Grossly normal Eyes:  no discharge. No scleral icterus.  Neck: No JVD, no carotid bruits  Cardiovascular: Regular rate and rhythm, no murmurs appreciated Pulmonary/Chest: Clear to auscultation bilaterally, no wheezes  or rails Abdominal: Soft.  no distension.  no tenderness.  Musculoskeletal: Normal range of motion Neurological:  normal muscle tone. Coordination normal. No atrophy Skin: Skin warm and dry Psychiatric: normal affect, pleasant   Recent Labs: 09/30/2018: ALT 7; BUN 16; Creatinine, Ser 1.23; Hemoglobin 11.1; Platelets 160; Potassium 4.5; Sodium 140; TSH 0.646    Lipid Panel Lab Results  Component Value Date   CHOL 218 (H) 09/30/2018   HDL 41 09/30/2018   LDLCALC 152 (H) 09/30/2018   TRIG 127 09/30/2018      Wt Readings from Last 3 Encounters:  10/12/18 251 lb 12 oz (114.2 kg)  09/25/18 247 lb (112 kg)  09/07/18 247 lb (112 kg)      ASSESSMENT AND PLAN:   Morbid obesity ([place]) Discussed diet with her, recommended low carbohydrate intake She reports unable to exercise secondary to chronic pain  Essential hypertension Blood pressure is well controlled on today's visit. No changes made to the medications.  Obstructive sleep apnea on CPAP "they came and got machine" PMD did a test, "does not need it"  Shortness of  breath Deconditioned, weight is elevated Stable symptoms  Tachycardia Stable on beta-blocker, no changes to her medications  Chronic pain, arms Chronic neck, shoulder, back, arm pain  on Neurontin,   Chronic issue, seen in the pain clinic Also with tingling nerve pain arms legs   Total encounter time more than 25 minutes  Greater than 50% was spent in counseling and coordination of care with the patient   Disposition:   F/U as needed   Orders Placed This Encounter  Procedures  . EKG 12-Lead     Signed, Esmond Plants, M.D., Ph.D. 10/12/2018  [place], [place]

## 2018-10-12 ENCOUNTER — Encounter: Payer: Self-pay | Admitting: Cardiovascular Disease

## 2018-10-12 ENCOUNTER — Ambulatory Visit (INDEPENDENT_AMBULATORY_CARE_PROVIDER_SITE_OTHER): Payer: Medicare Other | Admitting: Cardiovascular Disease

## 2018-10-12 VITALS — BP 120/80 | HR 84 | Ht 66.0 in | Wt 251.8 lb

## 2018-10-12 DIAGNOSIS — G4733 Obstructive sleep apnea (adult) (pediatric): Secondary | ICD-10-CM

## 2018-10-12 DIAGNOSIS — I420 Dilated cardiomyopathy: Secondary | ICD-10-CM

## 2018-10-12 DIAGNOSIS — L930 Discoid lupus erythematosus: Secondary | ICD-10-CM

## 2018-10-12 DIAGNOSIS — I5022 Chronic systolic (congestive) heart failure: Secondary | ICD-10-CM | POA: Diagnosis not present

## 2018-10-12 NOTE — Patient Instructions (Addendum)
Ask PMD about anemia and low Vit D   Medication Instructions:  No changes  If you need a refill on your cardiac medications before your next appointment, please call your pharmacy.    Lab work: No new labs needed   If you have labs (blood work) drawn today and your tests are completely normal, you will receive your results only by: Marland Kitchen MyChart Message (if you have MyChart) OR . A paper copy in the mail If you have any lab test that is abnormal or we need to change your treatment, we will call you to review the results.   Testing/Procedures: No new testing needed   Follow-Up: At [facility], you and your health needs are our priority.  As part of our continuing mission to provide you with exceptional heart care, we have created designated Provider Care Teams.  These Care Teams include your primary Cardiologist (physician) and Advanced Practice Providers (APPs -  Physician Assistants and Nurse Practitioners) who all work together to provide you with the care you need, when you need it.  . You will need a follow up appointment as needed  . Providers on your designated Care Team:   . Murray Hodgkins, NP . Christell Faith, PA-C . Marrianne Mood, PA-C  Any Other Special Instructions Will Be Listed Below (If Applicable).  For educational health videos Log in to : [email or web address] Or : [email or web address], password : [email or web address]

## 2018-10-29 DIAGNOSIS — R21 Rash and other nonspecific skin eruption: Secondary | ICD-10-CM | POA: Diagnosis not present

## 2018-10-29 DIAGNOSIS — M329 Systemic lupus erythematosus, unspecified: Secondary | ICD-10-CM | POA: Diagnosis not present

## 2018-10-29 DIAGNOSIS — K130 Diseases of lips: Secondary | ICD-10-CM | POA: Diagnosis not present

## 2018-10-29 DIAGNOSIS — L819 Disorder of pigmentation, unspecified: Secondary | ICD-10-CM | POA: Diagnosis not present

## 2018-10-29 DIAGNOSIS — L700 Acne vulgaris: Secondary | ICD-10-CM | POA: Diagnosis not present

## 2018-11-09 ENCOUNTER — Other Ambulatory Visit: Payer: Self-pay

## 2018-11-09 MED ORDER — FLUTICASONE PROPIONATE 50 MCG/ACT NA SUSP: 1.0000 | Freq: Every day | NASAL | 3 refills | Status: DC

## 2018-12-08 ENCOUNTER — Encounter: Payer: Self-pay | Admitting: Nurse Practitioner

## 2018-12-08 ENCOUNTER — Ambulatory Visit (INDEPENDENT_AMBULATORY_CARE_PROVIDER_SITE_OTHER): Payer: Medicare Other | Admitting: Nurse Practitioner

## 2018-12-08 VITALS — BP 126/86 | HR 84 | Resp 16 | Ht 66.0 in | Wt 248.0 lb

## 2018-12-08 DIAGNOSIS — R200 Anesthesia of skin: Secondary | ICD-10-CM

## 2018-12-08 DIAGNOSIS — Q0700 Arnold-Chiari syndrome without spina bifida or hydrocephalus: Secondary | ICD-10-CM | POA: Insufficient documentation

## 2018-12-08 DIAGNOSIS — R202 Paresthesia of skin: Secondary | ICD-10-CM | POA: Diagnosis not present

## 2018-12-08 DIAGNOSIS — E559 Vitamin D deficiency, unspecified: Secondary | ICD-10-CM | POA: Diagnosis not present

## 2018-12-08 DIAGNOSIS — M792 Neuralgia and neuritis, unspecified: Secondary | ICD-10-CM | POA: Diagnosis not present

## 2018-12-08 DIAGNOSIS — B379 Candidiasis, unspecified: Secondary | ICD-10-CM | POA: Diagnosis not present

## 2018-12-08 DIAGNOSIS — Z6841 Body Mass Index (BMI) 40.0 and over, adult: Secondary | ICD-10-CM

## 2018-12-08 MED ORDER — ERGOCALCIFEROL 1.25 MG (50000 UT) PO CAPS: 50000.0000 [IU] | ORAL_CAPSULE | ORAL | 5 refills | Status: DC

## 2018-12-08 MED ORDER — NYSTATIN 100000 UNIT/ML MT SUSP: 5.0000 mL | Freq: Four times a day (QID) | OROMUCOSAL | 2 refills | Status: DC

## 2018-12-08 MED ORDER — GABAPENTIN 300 MG PO CAPS: 300.0000 mg | ORAL_CAPSULE | Freq: Three times a day (TID) | ORAL | 1 refills | Status: DC

## 2018-12-08 MED ORDER — PHENTERMINE HCL 37.5 MG PO TABS: 37.5000 mg | ORAL_TABLET | Freq: Every day | ORAL | 1 refills | Status: DC

## 2018-12-08 NOTE — Progress Notes (Signed)
[facility] [place], [place] [address]  Internal MEDICINE  Office Visit Note  Patient Name: Morgan Kent  [id number]  [id number]  Date of Service: 12/08/2018  Chief Complaint  Patient presents with  . Medical Management of Chronic Issues    3 month follow up, medication refills  . Labs Only    review labs  . Nasal Congestion    started 8 days ago feel like its getting a little better, use OTC nasal strips to help with breathing, and stayed hydrated  . Rash    pt is concerned that she still have thrush    The patient is here for routine follow up. Overall, she is feeling well. Had congestion, cough, sore throat last week. She has felt better this week. She also gets rash, dry cracked skin in the corner of her lips. Using nystatin suspension on these areas, has helped more than prescription creams from dermatologist.  She would like to restart phentermine to help with weight management. Has been off of these for several months due to frequent sinus infections and fatigue. She has maintained her weight. Blood pressure is well controlled. Has not had negative side effects related to taking this medication in the past.  Did have labs done in November. Cholesterol was mildly elevated and vitamin d level was low.       Current Medication: Outpatient Encounter Medications as of 12/08/2018  Medication Sig  . adapalene (DIFFERIN) 0.1 % gel Apply 1 application topically at bedtime. Apply to forehead and chin  . albuterol (PROVENTIL HFA;VENTOLIN HFA) 108 (90 Base) MCG/ACT inhaler Inhale 2 puffs into the lungs daily.  Marland Kitchen antiseptic oral rinse (BIOTENE) LIQD 15 mLs by Mouth Rinse route 3 (three) times daily.   . Budesonide (PULMICORT FLEXHALER) 90 MCG/ACT inhaler Inhale 2 puffs into the lungs 2 (two) times daily.  . carvedilol (COREG) 25 MG tablet TAKE 1 TABLET BY MOUTH TWICE A DAY  . cetirizine (ZYRTEC) 10 MG tablet Take 1 tablet (10 mg total) by mouth daily.  .  clobetasol (TEMOVATE) 0.05 % external solution Apply 1 application topically 2 (two) times daily as needed. Apply to scalp  . diclofenac sodium (VOLTAREN) 1 % GEL Apply 1 application topically 2 (two) times daily as needed (for pain).  . fluticasone (FLONASE) 50 MCG/ACT nasal spray Place 1 spray into both nostrils at bedtime.  . gabapentin (NEURONTIN) 300 MG capsule Take 1 capsule (300 mg total) by mouth 3 (three) times daily.  . hydrochlorothiazide (HYDRODIURIL) 25 MG tablet TAKE 1 TABLET BY MOUTH EVERY DAY  . hydroxychloroquine (PLAQUENIL) 200 MG tablet Take 200 mg by mouth daily.   Marland Kitchen KETOCONAZOLE, TOPICAL, 1 % SHAM Apply 1 application topically every other day.   . lisinopril (PRINIVIL,ZESTRIL) 5 MG tablet TAKE 1 TABLET BY MOUTH ONCE DAILY  . meloxicam (MOBIC) 7.5 MG tablet Take 1 tablet (7.5 mg total) by mouth 2 (two) times daily.  . mometasone (ELOCON) 0.1 % ointment Apply 1 application topically 2 (two) times daily as needed (lupus outbreaks).  . montelukast (SINGULAIR) 10 MG tablet Take 1 tablet (10 mg total) by mouth at bedtime.  . norethindrone (MICRONOR,CAMILA,ERRIN) 0.35 MG tablet Take 1 tablet (0.35 mg total) by mouth daily.  Marland Kitchen nystatin (MYCOSTATIN) 100000 UNIT/ML suspension Take 5 mLs (500,000 Units total) by mouth 4 (four) times daily. 10 days dose started on 02-25-17  . oxyCODONE-acetaminophen (PERCOCET/ROXICET) 5-325 MG per tablet Take 1 tablet by mouth every 6 (six) hours as needed  for moderate pain.   Vladimir Faster Glycol-Propyl Glycol (SYSTANE ULTRA) 0.4-0.3 % SOLN Place 1 drop into both eyes daily as needed (for dry eyes).   . tacrolimus (PROTOPIC) 0.1 % ointment Apply 1 application topically daily.  Marland Kitchen tiZANidine (ZANAFLEX) 4 MG tablet Take 4 mg by mouth every 8 (eight) hours as needed for muscle spasms.  Marland Kitchen tretinoin (RETIN-A) 0.05 % cream Apply 1 application topically at bedtime.   . triamcinolone cream (KENALOG) 0.1 % Apply 1 application topically 2 (two) times daily as needed  (outbreaks).   . [DISCONTINUED] gabapentin (NEURONTIN) 300 MG capsule TAKE 1 CAPSULE(300 MG) BY MOUTH THREE TIMES DAILY  . [DISCONTINUED] nystatin (MYCOSTATIN) 100000 UNIT/ML suspension Take 5 mLs by mouth 4 (four) times daily. 10 days dose started on 02-25-17  . [DISCONTINUED] Vitamin D, Ergocalciferol, (DRISDOL) 50000 units CAPS capsule Take 50,000 Units by mouth every 7 (seven) days. On wednesdays  . ergocalciferol (DRISDOL) 1.25 MG (50000 UT) capsule Take 1 capsule (50,000 Units total) by mouth once a week.  . phentermine (ADIPEX-P) 37.5 MG tablet Take 1 tablet (37.5 mg total) by mouth daily before breakfast.   No facility-administered encounter medications on file as of 12/08/2018.     Surgical History: Past Surgical History:  Procedure Laterality Date  . carpel tunnel  Left 2014  . CRANIOTOMY  14,06   chiari  . DENTAL SURGERY    . HERNIA REPAIR     umbilical  . PLACEMENT OF LUMBAR DRAIN N/A 07/07/2014   Procedure: PLACEMENT OF LUMBAR DRAIN AND CLOSURE OF CERVICAL INCISION;  Surgeon: Newman Pies, MD;  Location: [facility];  Service: Neurosurgery;  Laterality: N/A;  . SUBOCCIPITAL CRANIECTOMY CERVICAL LAMINECTOMY N/A 09/29/2013   Procedure: SUBOCCIPITAL CRANIECTOMY CERVICAL LAMINECTOMY/DURAPLASTY;  Surgeon: Ophelia Charter, MD;  Location: [facility];  Service: Neurosurgery;  Laterality: N/A;  posterior  . SUBOCCIPITAL CRANIECTOMY CERVICAL LAMINECTOMY N/A 06/23/2014   Procedure: SUBOCCIPITAL CRANIECTOMY CERVICAL LAMINECTOMY/DURAPLASTY;  Surgeon: Newman Pies, MD;  Location: [facility];  Service: Neurosurgery;  Laterality: N/A;  suboccipital craniectomy with cervical laminectomy and duraplasty    Medical History: Past Medical History:  Diagnosis Date  . Anxiety   . Arthritis    rheumo possible  . Chiari malformation   . Depression   . Dizziness   . Headache(784.0)   . History of kidney stones   . Hypertension   . Lupus ([place])   . Lupus ([place])   . Nerve damage     NECK  . PONV (postoperative nausea and vomiting)    nausea only  . TMJ arthritis     Family History: Family History  Problem Relation Age of Onset  . Heart disease Father   . Hypertension Father   . Hypertension Mother   . Thyroid disease Mother   . Asthma Other   . Depression Other   . Diabetes Other   . Migraines Other   . Stroke Other     Social History   Socioeconomic History  . Marital status: Single    Spouse name: Not on file  . Number of children: Not on file  . Years of education: Not on file  . Highest education level: Not on file  Occupational History  . Not on file  Social Needs  . Financial resource strain: Not on file  . Food insecurity:    Worry: Not on file    Inability: Not on file  . Transportation needs:    Medical: Not on file  Non-medical: Not on file  Tobacco Use  . Smoking status: Former Smoker    Types: Cigarettes  . Smokeless tobacco: Never Used  . Tobacco comment: stopped in 1999  Substance and Sexual Activity  . Alcohol use: No    Alcohol/week: 0.0 standard drinks  . Drug use: No  . Sexual activity: Not on file  Lifestyle  . Physical activity:    Days per week: Not on file    Minutes per session: Not on file  . Stress: Not on file  Relationships  . Social connections:    Talks on phone: Not on file    Gets together: Not on file    Attends religious service: Not on file    Active member of club or organization: Not on file    Attends meetings of clubs or organizations: Not on file    Relationship status: Not on file  . Intimate partner violence:    Fear of current or ex partner: Not on file    Emotionally abused: Not on file    Physically abused: Not on file    Forced sexual activity: Not on file  Other Topics Concern  . Not on file  Social History Narrative   Lives with 3 children in 2 story home.  Unemployed.  Trying to get disability.  Education: high school.      Review of Systems  Constitutional: Positive for  fatigue. Negative for chills and unexpected weight change.       Weight maintenance over past few months.   HENT: Negative for congestion, ear pain, postnasal drip, rhinorrhea, sneezing and sore throat.   Respiratory: Negative for cough, chest tightness, shortness of breath and wheezing.   Cardiovascular: Negative for chest pain and palpitations.  Gastrointestinal: Negative for abdominal pain, constipation, diarrhea, nausea and vomiting.  Endocrine: Negative for cold intolerance, heat intolerance, polydipsia and polyuria.  Musculoskeletal: Positive for arthralgias, back pain, myalgias and neck pain. Negative for joint swelling.       Regularly seeing pain management   Skin: Positive for rash.       Moderate discoid lupus.  Allergic/Immunologic: Positive for environmental allergies.  Neurological: Positive for headaches. Negative for tremors and numbness.  Hematological: Negative for adenopathy. Does not bruise/bleed easily.  Psychiatric/Behavioral: Negative for behavioral problems (Depression), sleep disturbance and suicidal ideas. The patient is not nervous/anxious.     Today's Vitals   12/08/18 0845  BP: 126/86  Pulse: 84  Resp: 16  SpO2: 98%  Weight: 248 lb (112.5 kg)  Height: 5\' 6"  (1.676 m)   Body mass index is 40.03 kg/m.  Physical Exam Vitals signs and nursing note reviewed.  Constitutional:      General: She is not in acute distress.    Appearance: She is well-developed. She is obese. She is not diaphoretic.  HENT:     Head: Normocephalic and atraumatic.     Right Ear: External ear normal.     Left Ear: External ear normal.     Nose: Nose normal.     Mouth/Throat:     Pharynx: No oropharyngeal exudate.  Eyes:     Pupils: Pupils are equal, round, and reactive to light.  Neck:     Musculoskeletal: Normal range of motion and neck supple.     Thyroid: No thyromegaly.     Vascular: No JVD.     Trachea: No tracheal deviation.  Cardiovascular:     Rate and Rhythm:  Normal rate and regular rhythm.  Heart sounds: Normal heart sounds. No murmur. No friction rub. No gallop.   Pulmonary:     Effort: Pulmonary effort is normal. No respiratory distress.     Breath sounds: Normal breath sounds. No wheezing or rales.  Chest:     Chest wall: No tenderness.  Abdominal:     General: Bowel sounds are normal.     Palpations: Abdomen is soft.     Tenderness: There is no abdominal tenderness.  Lymphadenopathy:     Cervical: No cervical adenopathy.  Skin:    General: Skin is warm and dry.  Neurological:     Mental Status: She is alert and oriented to person, place, and time. Mental status is at baseline.     Cranial Nerves: No cranial nerve deficit.  Psychiatric:        Behavior: Behavior normal.        Thought Content: Thought content normal.        Judgment: Judgment normal.   Assessment/Plan: 1. Neuralgia May continue gabapentin 300mg  TID as needed. Refills provided today.  - gabapentin (NEURONTIN) 300 MG capsule; Take 1 capsule (300 mg total) by mouth 3 (three) times daily.  Dispense: 270 capsule; Refill: 1  2. Numbness and tingling May continue gabapentin 300mg  TID as needed. Refills provided today.  - gabapentin (NEURONTIN) 300 MG capsule; Take 1 capsule (300 mg total) by mouth 3 (three) times daily.  Dispense: 270 capsule; Refill: 1  3. Candidiasis Mostly on corners of mouth. Intermittent. Nystatin suspension should be used four times daily as needed.  - nystatin (MYCOSTATIN) 100000 UNIT/ML suspension; Take 5 mLs (500,000 Units total) by mouth 4 (four) times daily. 10 days dose started on 02-25-17  Dispense: 200 mL; Refill: 2  4. Body mass index 40.0-44.9, adult (HCC) Restart phentermine 37.5mg  daily. Limit calorie intake to 1500 calories per day. Advised her to gradually incorporate exercise into daily routine.  - phentermine (ADIPEX-P) 37.5 MG tablet; Take 1 tablet (37.5 mg total) by mouth daily before breakfast.  Dispense: 30 tablet; Refill:  1  5. Vitamin D deficiency Drisdol weekly.  - ergocalciferol (DRISDOL) 1.25 MG (50000 UT) capsule; Take 1 capsule (50,000 Units total) by mouth once a week.  Dispense: 4 capsule; Refill: 5  General Counseling: Yuvonne verbalizes understanding of the findings of todays visit and agrees with plan of treatment. I have discussed any further diagnostic evaluation that may be needed or ordered today. We also reviewed her medications today. she has been encouraged to call the office with any questions or concerns that should arise related to todays visit.    There is a liability release in patients' chart. There has been a 10 minute discussion about the side effects including but not limited to elevated blood pressure, anxiety, lack of sleep and dry mouth. Pt understands and will like to start/continue on appetite suppressant at this time. There will be one month RX given at the time of visit with proper follow up. Nova diet plan with restricted calories is given to the pt. Pt understands and agrees with  plan of treatment  This patient was seen by Leretha Pol FNP Collaboration with Dr Lavera Guise as a part of collaborative care agreement  Meds ordered this encounter  Medications  . gabapentin (NEURONTIN) 300 MG capsule    Sig: Take 1 capsule (300 mg total) by mouth 3 (three) times daily.    Dispense:  270 capsule    Refill:  1    Order Specific Question:  Supervising Provider    Answer:   Lavera Guise [[id number]]  . nystatin (MYCOSTATIN) 100000 UNIT/ML suspension    Sig: Take 5 mLs (500,000 Units total) by mouth 4 (four) times daily. 10 days dose started on 02-25-17    Dispense:  200 mL    Refill:  2    Order Specific Question:   Supervising Provider    Answer:   Lavera Guise [place]  . phentermine (ADIPEX-P) 37.5 MG tablet    Sig: Take 1 tablet (37.5 mg total) by mouth daily before breakfast.    Dispense:  30 tablet    Refill:  1    Order Specific Question:   Supervising Provider     Answer:   Lavera Guise [place]  . ergocalciferol (DRISDOL) 1.25 MG (50000 UT) capsule    Sig: Take 1 capsule (50,000 Units total) by mouth once a week.    Dispense:  4 capsule    Refill:  5    Order Specific Question:   Supervising Provider    Answer:   Lavera Guise [[id number]]    Time spent: 21 Minutes      Dr Lavera Guise Internal medicine

## 2018-12-10 ENCOUNTER — Other Ambulatory Visit: Payer: Self-pay | Admitting: Cardiovascular Disease

## 2018-12-10 ENCOUNTER — Other Ambulatory Visit: Payer: Self-pay

## 2018-12-10 MED ORDER — MELOXICAM 7.5 MG PO TABS: 7.5000 mg | ORAL_TABLET | Freq: Two times a day (BID) | ORAL | 1 refills | Status: DC

## 2018-12-18 ENCOUNTER — Encounter: Payer: Self-pay | Admitting: Nurse Practitioner

## 2018-12-18 ENCOUNTER — Ambulatory Visit (INDEPENDENT_AMBULATORY_CARE_PROVIDER_SITE_OTHER): Payer: Medicare Other | Admitting: Nurse Practitioner

## 2018-12-18 VITALS — BP 112/78 | HR 102 | Temp 99.1°F | Resp 16 | Ht 66.0 in | Wt 250.4 lb

## 2018-12-18 DIAGNOSIS — J0191 Acute recurrent sinusitis, unspecified: Secondary | ICD-10-CM

## 2018-12-18 DIAGNOSIS — R6889 Other general symptoms and signs: Secondary | ICD-10-CM | POA: Diagnosis not present

## 2018-12-18 DIAGNOSIS — J029 Acute pharyngitis, unspecified: Secondary | ICD-10-CM

## 2018-12-18 LAB — POC INFLUENZA TEST
Negative: NEGATIVE
Positive: NEGATIVE

## 2018-12-18 LAB — POCT RAPID STREP A (OFFICE): Rapid Strep A Screen: NEGATIVE

## 2018-12-18 MED ORDER — CLARITHROMYCIN 500 MG PO TABS: 500.0000 mg | ORAL_TABLET | Freq: Two times a day (BID) | ORAL | 0 refills | Status: DC

## 2018-12-18 NOTE — Progress Notes (Signed)
[facility] [place], [place] [address]  Internal MEDICINE  Office Visit Note  Patient Name: Morgan Kent  [id number]  [id number]  Date of Service: 12/23/2018  Chief Complaint  Patient presents with  . Nasal Congestion    7 days ago, runny nose and stuffy nose, pt has pain around eyes and nose  . Sore Throat    no fever or chills, pt has not been around anybody sick  . Cough    dry cough, when pt talk it hurts, pt does not have any headaches, some vomiting with cough     Sore Throat   This is a new problem. The current episode started in the past 7 days. The problem has been unchanged. There has been no fever. The pain is mild. Associated symptoms include congestion, coughing, ear pain, headaches, a hoarse voice and vomiting. She has tried cool liquids (takes prescribed pain medication which keeps her from having pain) for the symptoms.  Cough  This is a new problem. The current episode started in the past 7 days. The problem has been unchanged. The problem occurs every few minutes. The cough is non-productive. Associated symptoms include chills, ear congestion, ear pain, headaches, myalgias, nasal congestion, postnasal drip, rhinorrhea, a sore throat and wheezing. Pertinent negatives include no chest pain or fever. She has tried a beta-agonist inhaler for the symptoms. Her past medical history is significant for environmental allergies.   Pt is here for a sick visit.     Current Medication:  Outpatient Encounter Medications as of 12/18/2018  Medication Sig  . adapalene (DIFFERIN) 0.1 % gel Apply 1 application topically at bedtime. Apply to forehead and chin  . albuterol (PROVENTIL HFA;VENTOLIN HFA) 108 (90 Base) MCG/ACT inhaler Inhale 2 puffs into the lungs daily.  Marland Kitchen antiseptic oral rinse (BIOTENE) LIQD 15 mLs by Mouth Rinse route 3 (three) times daily.   . Budesonide (PULMICORT FLEXHALER) 90 MCG/ACT inhaler Inhale 2 puffs into the lungs 2 (two) times  daily.  . carvedilol (COREG) 25 MG tablet TAKE 1 TABLET BY MOUTH TWICE A DAY  . cetirizine (ZYRTEC) 10 MG tablet Take 1 tablet (10 mg total) by mouth daily.  . clobetasol (TEMOVATE) 0.05 % external solution Apply 1 application topically 2 (two) times daily as needed. Apply to scalp  . diclofenac sodium (VOLTAREN) 1 % GEL Apply 1 application topically 2 (two) times daily as needed (for pain).  Marland Kitchen ergocalciferol (DRISDOL) 1.25 MG (50000 UT) capsule Take 1 capsule (50,000 Units total) by mouth once a week.  . fluticasone (FLONASE) 50 MCG/ACT nasal spray Place 1 spray into both nostrils at bedtime.  . gabapentin (NEURONTIN) 300 MG capsule Take 1 capsule (300 mg total) by mouth 3 (three) times daily.  . hydrochlorothiazide (HYDRODIURIL) 25 MG tablet Take 1 tablet (25 mg total) by mouth daily.  . hydroxychloroquine (PLAQUENIL) 200 MG tablet Take 200 mg by mouth daily.   Marland Kitchen KETOCONAZOLE, TOPICAL, 1 % SHAM Apply 1 application topically every other day.   . lisinopril (PRINIVIL,ZESTRIL) 5 MG tablet TAKE 1 TABLET BY MOUTH ONCE DAILY  . meloxicam (MOBIC) 7.5 MG tablet Take 1 tablet (7.5 mg total) by mouth 2 (two) times daily.  . mometasone (ELOCON) 0.1 % ointment Apply 1 application topically 2 (two) times daily as needed (lupus outbreaks).  . montelukast (SINGULAIR) 10 MG tablet Take 1 tablet (10 mg total) by mouth at bedtime.  . norethindrone (MICRONOR,CAMILA,ERRIN) 0.35 MG tablet Take 1 tablet (0.35 mg total) by  mouth daily.  Marland Kitchen nystatin (MYCOSTATIN) 100000 UNIT/ML suspension Take 5 mLs (500,000 Units total) by mouth 4 (four) times daily. 10 days dose started on 02-25-17  . oxyCODONE-acetaminophen (PERCOCET/ROXICET) 5-325 MG per tablet Take 1 tablet by mouth every 6 (six) hours as needed for moderate pain.   . phentermine (ADIPEX-P) 37.5 MG tablet Take 1 tablet (37.5 mg total) by mouth daily before breakfast.  . Polyethyl Glycol-Propyl Glycol (SYSTANE ULTRA) 0.4-0.3 % SOLN Place 1 drop into both eyes daily as  needed (for dry eyes).   . tacrolimus (PROTOPIC) 0.1 % ointment Apply 1 application topically daily.  Marland Kitchen tiZANidine (ZANAFLEX) 4 MG tablet Take 4 mg by mouth every 8 (eight) hours as needed for muscle spasms.  Marland Kitchen tretinoin (RETIN-A) 0.05 % cream Apply 1 application topically at bedtime.   . triamcinolone cream (KENALOG) 0.1 % Apply 1 application topically 2 (two) times daily as needed (outbreaks).   . clarithromycin (BIAXIN) 500 MG tablet Take 1 tablet (500 mg total) by mouth 2 (two) times daily.   No facility-administered encounter medications on file as of 12/18/2018.       Medical History: Past Medical History:  Diagnosis Date  . Anxiety   . Arthritis    rheumo possible  . Chiari malformation   . Depression   . Dizziness   . Headache(784.0)   . History of kidney stones   . Hypertension   . Lupus ([place])   . Lupus ([place])   . Nerve damage    NECK  . PONV (postoperative nausea and vomiting)    nausea only  . TMJ arthritis     Today's Vitals   12/18/18 1141  BP: 112/78  Pulse: (!) 102  Resp: 16  Temp: 99.1 F (37.3 C)  SpO2: 98%  Weight: 250 lb 6.4 oz (113.6 kg)  Height: 5\' 6"  (1.676 m)   Body mass index is 40.42 kg/m.     Review of Systems  Constitutional: Positive for chills and fatigue. Negative for fever.  HENT: Positive for congestion, ear pain, hoarse voice, postnasal drip, rhinorrhea, sinus pain, sore throat and voice change.   Respiratory: Positive for cough and wheezing.   Cardiovascular: Negative for chest pain and palpitations.  Gastrointestinal: Positive for nausea and vomiting.  Musculoskeletal: Positive for arthralgias and myalgias.  Skin: Negative.   Allergic/Immunologic: Positive for environmental allergies.  Neurological: Positive for headaches.  Hematological: Positive for adenopathy.    Physical Exam Vitals signs and nursing note reviewed.  Constitutional:      General: She is not in acute distress.    Appearance: She is well-developed. She  is ill-appearing. She is not diaphoretic.  HENT:     Head: Normocephalic and atraumatic.     Right Ear: Tympanic membrane is erythematous and bulging.     Left Ear: Tympanic membrane is erythematous and bulging.     Nose: Congestion present.     Right Sinus: Maxillary sinus tenderness present.     Left Sinus: Maxillary sinus tenderness present.     Mouth/Throat:     Pharynx: Posterior oropharyngeal erythema present. No oropharyngeal exudate.  Eyes:     Pupils: Pupils are equal, round, and reactive to light.  Neck:     Musculoskeletal: Normal range of motion and neck supple.     Thyroid: No thyromegaly.     Vascular: No JVD.     Trachea: No tracheal deviation.  Cardiovascular:     Rate and Rhythm: Normal rate and regular rhythm.  Heart sounds: Normal heart sounds. No murmur. No friction rub. No gallop.   Pulmonary:     Effort: Pulmonary effort is normal. No respiratory distress.     Breath sounds: Normal breath sounds. No wheezing or rales.  Chest:     Chest wall: No tenderness.  Abdominal:     General: Bowel sounds are normal.     Palpations: Abdomen is soft.  Musculoskeletal: Normal range of motion.  Lymphadenopathy:     Cervical: Cervical adenopathy present.  Skin:    General: Skin is warm and dry.  Neurological:     Mental Status: She is alert and oriented to person, place, and time.     Cranial Nerves: No cranial nerve deficit.  Psychiatric:        Behavior: Behavior normal.        Thought Content: Thought content normal.        Judgment: Judgment normal.    Assessment/Plan:  1. Acute recurrent sinusitis, unspecified location Flu and strep tests both negative. Start biaxin 500mg  twice daily twice daily for 10 days. Rest and increase fluids. Recommend use of OTC medications to improve symptoms.  - clarithromycin (BIAXIN) 500 MG tablet; Take 1 tablet (500 mg total) by mouth 2 (two) times daily.  Dispense: 20 tablet; Refill: 0  2. Sore throat - POCT rapid strep A  negative. Recommend warm salt water gargles as needed to relieve sore throat.   3. Flu-like symptoms - POC INFLUENZA TEST - negative. Treat as recurrant sinusitis.   General Counseling: Morgan Kent verbalizes understanding of the findings of todays visit and agrees with plan of treatment. I have discussed any further diagnostic evaluation that may be needed or ordered today. We also reviewed her medications today. she has been encouraged to call the office with any questions or concerns that should arise related to todays visit.    Counseling:  Rest and increase fluids. Continue using OTC medication to control symptoms.   This patient was seen by Leretha Pol FNP Collaboration with Dr Lavera Guise as a part of collaborative care agreement  Orders Placed This Encounter  Procedures  . POC INFLUENZA TEST  . POCT rapid strep A    Meds ordered this encounter  Medications  . clarithromycin (BIAXIN) 500 MG tablet    Sig: Take 1 tablet (500 mg total) by mouth 2 (two) times daily.    Dispense:  20 tablet    Refill:  0    Order Specific Question:   Supervising Provider    Answer:   Lavera Guise [[id number]]    Time spent: 25 Minutes

## 2018-12-23 DIAGNOSIS — R6889 Other general symptoms and signs: Secondary | ICD-10-CM | POA: Insufficient documentation

## 2018-12-23 DIAGNOSIS — J029 Acute pharyngitis, unspecified: Secondary | ICD-10-CM | POA: Insufficient documentation

## 2019-01-08 ENCOUNTER — Encounter: Payer: Self-pay | Admitting: Nurse Practitioner

## 2019-01-08 ENCOUNTER — Ambulatory Visit (INDEPENDENT_AMBULATORY_CARE_PROVIDER_SITE_OTHER): Payer: Medicare Other | Admitting: Nurse Practitioner

## 2019-01-08 VITALS — BP 112/75 | HR 96 | Resp 16 | Ht 66.0 in | Wt 241.8 lb

## 2019-01-08 DIAGNOSIS — I10 Essential (primary) hypertension: Secondary | ICD-10-CM

## 2019-01-08 DIAGNOSIS — E668 Other obesity: Secondary | ICD-10-CM | POA: Diagnosis not present

## 2019-01-08 NOTE — Progress Notes (Signed)
[facility] [place], [place] [address]  Internal MEDICINE  Office Visit Note  Patient Name: Morgan Kent  [id number]  [id number]  Date of Service: 01/08/2019  Chief Complaint  Patient presents with  . Medical Management of Chronic Issues    weight Management    The patient is here for follow up of weight management. She is currently taking phentermine daily to help suppress the appetite. She has lost 9 pounds since her last visit. She has been making better dietary choices and is drinking more water. Has cut sodas out of her diet completely. Doing well with the medication. No negative side effects to report today.  She states that her pain management provider has asked for reports of most recent MRIs of cervical and lumbar spine, as well as the brain. Will print these out at end of visit for her to take to them.       Current Medication: Outpatient Encounter Medications as of 01/08/2019  Medication Sig  . adapalene (DIFFERIN) 0.1 % gel Apply 1 application topically at bedtime. Apply to forehead and chin  . albuterol (PROVENTIL HFA;VENTOLIN HFA) 108 (90 Base) MCG/ACT inhaler Inhale 2 puffs into the lungs daily.  Marland Kitchen antiseptic oral rinse (BIOTENE) LIQD 15 mLs by Mouth Rinse route 3 (three) times daily.   . Budesonide (PULMICORT FLEXHALER) 90 MCG/ACT inhaler Inhale 2 puffs into the lungs 2 (two) times daily.  . carvedilol (COREG) 25 MG tablet TAKE 1 TABLET BY MOUTH TWICE A DAY  . cetirizine (ZYRTEC) 10 MG tablet Take 1 tablet (10 mg total) by mouth daily.  . clarithromycin (BIAXIN) 500 MG tablet Take 1 tablet (500 mg total) by mouth 2 (two) times daily.  . clobetasol (TEMOVATE) 0.05 % external solution Apply 1 application topically 2 (two) times daily as needed. Apply to scalp  . diclofenac sodium (VOLTAREN) 1 % GEL Apply 1 application topically 2 (two) times daily as needed (for pain).  Marland Kitchen ergocalciferol (DRISDOL) 1.25 MG (50000 UT) capsule Take 1 capsule  (50,000 Units total) by mouth once a week.  . fluticasone (FLONASE) 50 MCG/ACT nasal spray Place 1 spray into both nostrils at bedtime.  . gabapentin (NEURONTIN) 300 MG capsule Take 1 capsule (300 mg total) by mouth 3 (three) times daily.  . hydrochlorothiazide (HYDRODIURIL) 25 MG tablet Take 1 tablet (25 mg total) by mouth daily.  . hydroxychloroquine (PLAQUENIL) 200 MG tablet Take 200 mg by mouth daily.   Marland Kitchen KETOCONAZOLE, TOPICAL, 1 % SHAM Apply 1 application topically every other day.   . lisinopril (PRINIVIL,ZESTRIL) 5 MG tablet TAKE 1 TABLET BY MOUTH ONCE DAILY  . meloxicam (MOBIC) 7.5 MG tablet Take 1 tablet (7.5 mg total) by mouth 2 (two) times daily.  . mometasone (ELOCON) 0.1 % ointment Apply 1 application topically 2 (two) times daily as needed (lupus outbreaks).  . montelukast (SINGULAIR) 10 MG tablet Take 1 tablet (10 mg total) by mouth at bedtime.  . norethindrone (MICRONOR,CAMILA,ERRIN) 0.35 MG tablet Take 1 tablet (0.35 mg total) by mouth daily.  Marland Kitchen nystatin (MYCOSTATIN) 100000 UNIT/ML suspension Take 5 mLs (500,000 Units total) by mouth 4 (four) times daily. 10 days dose started on 02-25-17  . oxyCODONE-acetaminophen (PERCOCET/ROXICET) 5-325 MG per tablet Take 1 tablet by mouth every 6 (six) hours as needed for moderate pain.   . phentermine (ADIPEX-P) 37.5 MG tablet Take 1 tablet (37.5 mg total) by mouth daily before breakfast.  . Polyethyl Glycol-Propyl Glycol (SYSTANE ULTRA) 0.4-0.3 % SOLN Place 1  drop into both eyes daily as needed (for dry eyes).   . tacrolimus (PROTOPIC) 0.1 % ointment Apply 1 application topically daily.  Marland Kitchen tiZANidine (ZANAFLEX) 4 MG tablet Take 4 mg by mouth every 8 (eight) hours as needed for muscle spasms.  Marland Kitchen tretinoin (RETIN-A) 0.05 % cream Apply 1 application topically at bedtime.   . triamcinolone cream (KENALOG) 0.1 % Apply 1 application topically 2 (two) times daily as needed (outbreaks).    No facility-administered encounter medications on file as of  01/08/2019.     Surgical History: Past Surgical History:  Procedure Laterality Date  . carpel tunnel  Left 2014  . CRANIOTOMY  14,06   chiari  . DENTAL SURGERY    . HERNIA REPAIR     umbilical  . PLACEMENT OF LUMBAR DRAIN N/A 07/07/2014   Procedure: PLACEMENT OF LUMBAR DRAIN AND CLOSURE OF CERVICAL INCISION;  Surgeon: Newman Pies, MD;  Location: [facility];  Service: Neurosurgery;  Laterality: N/A;  . SUBOCCIPITAL CRANIECTOMY CERVICAL LAMINECTOMY N/A 09/29/2013   Procedure: SUBOCCIPITAL CRANIECTOMY CERVICAL LAMINECTOMY/DURAPLASTY;  Surgeon: Ophelia Charter, MD;  Location: [facility];  Service: Neurosurgery;  Laterality: N/A;  posterior  . SUBOCCIPITAL CRANIECTOMY CERVICAL LAMINECTOMY N/A 06/23/2014   Procedure: SUBOCCIPITAL CRANIECTOMY CERVICAL LAMINECTOMY/DURAPLASTY;  Surgeon: Newman Pies, MD;  Location: [facility];  Service: Neurosurgery;  Laterality: N/A;  suboccipital craniectomy with cervical laminectomy and duraplasty    Medical History: Past Medical History:  Diagnosis Date  . Anxiety   . Arthritis    rheumo possible  . Chiari malformation   . Depression   . Dizziness   . Headache(784.0)   . History of kidney stones   . Hypertension   . Lupus ([place])   . Lupus ([place])   . Nerve damage    NECK  . PONV (postoperative nausea and vomiting)    nausea only  . TMJ arthritis     Family History: Family History  Problem Relation Age of Onset  . Heart disease Father   . Hypertension Father   . Hypertension Mother   . Thyroid disease Mother   . Asthma Other   . Depression Other   . Diabetes Other   . Migraines Other   . Stroke Other     Social History   Socioeconomic History  . Marital status: Single    Spouse name: Not on file  . Number of children: Not on file  . Years of education: Not on file  . Highest education level: Not on file  Occupational History  . Not on file  Social Needs  . Financial resource strain: Not on file  . Food insecurity:     Worry: Not on file    Inability: Not on file  . Transportation needs:    Medical: Not on file    Non-medical: Not on file  Tobacco Use  . Smoking status: Former Smoker    Types: Cigarettes  . Smokeless tobacco: Never Used  . Tobacco comment: stopped in 1999  Substance and Sexual Activity  . Alcohol use: No    Alcohol/week: 0.0 standard drinks  . Drug use: No  . Sexual activity: Not on file  Lifestyle  . Physical activity:    Days per week: Not on file    Minutes per session: Not on file  . Stress: Not on file  Relationships  . Social connections:    Talks on phone: Not on file    Gets together: Not on file  Attends religious service: Not on file    Active member of club or organization: Not on file    Attends meetings of clubs or organizations: Not on file    Relationship status: Not on file  . Intimate partner violence:    Fear of current or ex partner: Not on file    Emotionally abused: Not on file    Physically abused: Not on file    Forced sexual activity: Not on file  Other Topics Concern  . Not on file  Social History Narrative   Lives with 3 children in 2 story home.  Unemployed.  Trying to get disability.  Education: high school.      Review of Systems  Constitutional: Negative for activity change, chills, fatigue and unexpected weight change.       Nine pound weight loss since her last visit   HENT: Negative for congestion, postnasal drip, rhinorrhea, sneezing and sore throat.   Respiratory: Negative for cough, chest tightness and shortness of breath.   Cardiovascular: Negative for chest pain and palpitations.  Gastrointestinal: Negative for abdominal pain, constipation, diarrhea, nausea and vomiting.  Endocrine: Negative for cold intolerance, heat intolerance, polydipsia and polyuria.  Musculoskeletal: Positive for back pain and myalgias. Negative for arthralgias, joint swelling and neck pain.       Sees pain management provider   Skin: Negative for  rash.  Allergic/Immunologic: Positive for environmental allergies.  Neurological: Positive for headaches. Negative for tremors and numbness.  Hematological: Negative for adenopathy. Does not bruise/bleed easily.  Psychiatric/Behavioral: Negative for behavioral problems (Depression), sleep disturbance and suicidal ideas. The patient is not nervous/anxious.     Today's Vitals   01/08/19 0951  BP: 112/75  Pulse: 96  Resp: 16  SpO2: 95%  Weight: 241 lb 12.8 oz (109.7 kg)  Height: 5\' 6"  (1.676 m)   Body mass index is 39.03 kg/m.  Physical Exam Vitals signs and nursing note reviewed.  Constitutional:      General: She is not in acute distress.    Appearance: She is well-developed. She is obese. She is not diaphoretic.  HENT:     Head: Normocephalic and atraumatic.     Right Ear: Ear canal and external ear normal.     Left Ear: Ear canal and external ear normal.     Nose: Nose normal.     Mouth/Throat:     Pharynx: No oropharyngeal exudate.  Eyes:     Pupils: Pupils are equal, round, and reactive to light.  Neck:     Musculoskeletal: Normal range of motion and neck supple.     Thyroid: No thyromegaly.     Vascular: No JVD.     Trachea: No tracheal deviation.  Cardiovascular:     Rate and Rhythm: Normal rate and regular rhythm.     Heart sounds: Normal heart sounds. No murmur. No friction rub. No gallop.   Pulmonary:     Effort: Pulmonary effort is normal. No respiratory distress.     Breath sounds: Normal breath sounds. No wheezing or rales.  Chest:     Chest wall: No tenderness.  Abdominal:     General: Bowel sounds are normal.     Palpations: Abdomen is soft.     Tenderness: There is no abdominal tenderness.  Lymphadenopathy:     Cervical: No cervical adenopathy.  Skin:    General: Skin is warm and dry.  Neurological:     Mental Status: She is alert and oriented to person, place, and  time. Mental status is at baseline.     Cranial Nerves: No cranial nerve deficit.   Psychiatric:        Behavior: Behavior normal.        Thought Content: Thought content normal.        Judgment: Judgment normal.    Assessment/Plan: 1. Essential hypertension Stable. Continue bp medication as prescribed   2. Moderate obesity Improving. May continue phentermine daily. Limit calorie intake to 1500 calories per day and maintain healthy lifestyle changes.  General Counseling: Chenita verbalizes understanding of the findings of todays visit and agrees with plan of treatment. I have discussed any further diagnostic evaluation that may be needed or ordered today. We also reviewed her medications today. she has been encouraged to call the office with any questions or concerns that should arise related to todays visit.   There is a liability release in patients' chart. There has been a 10 minute discussion about the side effects including but not limited to elevated blood pressure, anxiety, lack of sleep and dry mouth. Pt understands and will like to start/continue on appetite suppressant at this time. There will be one month RX given at the time of visit with proper follow up. Nova diet plan with restricted calories is given to the pt. Pt understands and agrees with  plan of treatment  This patient was seen by Leretha Pol FNP Collaboration with Dr Lavera Guise as a part of collaborative care agreement  Time spent: 58 Minutes      Dr Lavera Guise Internal medicine

## 2019-01-15 ENCOUNTER — Other Ambulatory Visit: Payer: Self-pay

## 2019-01-15 MED ORDER — NORETHINDRONE 0.35 MG PO TABS: 1.0000 | ORAL_TABLET | Freq: Every day | ORAL | 3 refills | Status: DC

## 2019-01-21 ENCOUNTER — Other Ambulatory Visit: Payer: Self-pay

## 2019-01-21 ENCOUNTER — Encounter: Payer: Self-pay | Admitting: Nurse Practitioner

## 2019-01-21 ENCOUNTER — Ambulatory Visit (INDEPENDENT_AMBULATORY_CARE_PROVIDER_SITE_OTHER): Payer: Medicare Other | Admitting: Nurse Practitioner

## 2019-01-21 VITALS — BP 121/83 | HR 103 | Temp 98.2°F | Resp 16 | Ht 66.0 in | Wt 242.0 lb

## 2019-01-21 DIAGNOSIS — I10 Essential (primary) hypertension: Secondary | ICD-10-CM

## 2019-01-21 DIAGNOSIS — J0191 Acute recurrent sinusitis, unspecified: Secondary | ICD-10-CM

## 2019-01-21 MED ORDER — CLARITHROMYCIN 500 MG PO TABS: 500.0000 mg | ORAL_TABLET | Freq: Two times a day (BID) | ORAL | 0 refills | Status: DC

## 2019-01-21 MED ORDER — PREDNISONE 10 MG (21) PO TBPK: ORAL_TABLET | ORAL | 0 refills | Status: DC

## 2019-01-21 NOTE — Progress Notes (Signed)
[facility] [place], [place] [address]  Internal MEDICINE  Office Visit Note  Patient Name: Morgan Kent  [id number]  [id number]  Date of Service: 02/17/2019   Pt is here for a sick visit.   Chief Complaint  Patient presents with  . Sinusitis    possible sinus infection, pt was last seen at the beginning of the moth for sinus infection and seems to not be getting any better, no vomitting, no diarrhea  . Cough    dry cough only at night, two days ago she coughed hard and felt nauseous but just that day, pt states that her face hurt and her skin on her face feels like its hot  . Nasal Congestion    congestion during the day at night it turns into a runny nose  . Headache  . Pain    ear pain two days ago, both ears     Sinusitis  This is a recurrent problem. The current episode started in the past 7 days. The problem has been gradually worsening since onset. There has been no fever. Associated symptoms include chills, congestion, coughing, ear pain, headaches, a hoarse voice and a sore throat. Past treatments include nothing.       Current Medication:  Outpatient Encounter Medications as of 01/21/2019  Medication Sig  . adapalene (DIFFERIN) 0.1 % gel Apply 1 application topically at bedtime. Apply to forehead and chin  . Budesonide (PULMICORT FLEXHALER) 90 MCG/ACT inhaler Inhale 2 puffs into the lungs 2 (two) times daily.  . carvedilol (COREG) 25 MG tablet TAKE 1 TABLET BY MOUTH TWICE A DAY  . clarithromycin (BIAXIN) 500 MG tablet Take 1 tablet (500 mg total) by mouth 2 (two) times daily.  . clobetasol (TEMOVATE) 0.05 % external solution Apply 1 application topically 2 (two) times daily as needed. Apply to scalp  . ergocalciferol (DRISDOL) 1.25 MG (50000 UT) capsule Take 1 capsule (50,000 Units total) by mouth once a week.  . fluticasone (FLONASE) 50 MCG/ACT nasal spray Place 1 spray into both nostrils at bedtime.  . hydrochlorothiazide  (HYDRODIURIL) 25 MG tablet Take 1 tablet (25 mg total) by mouth daily.  . hydroxychloroquine (PLAQUENIL) 200 MG tablet Take 200 mg by mouth daily.   Marland Kitchen KETOCONAZOLE, TOPICAL, 1 % SHAM Apply 1 application topically every other day.   . lisinopril (PRINIVIL,ZESTRIL) 5 MG tablet TAKE 1 TABLET BY MOUTH ONCE DAILY  . meloxicam (MOBIC) 7.5 MG tablet Take 1 tablet (7.5 mg total) by mouth 2 (two) times daily.  . mometasone (ELOCON) 0.1 % ointment Apply 1 application topically 2 (two) times daily as needed (lupus outbreaks).  . nystatin (MYCOSTATIN) 100000 UNIT/ML suspension Take 5 mLs (500,000 Units total) by mouth 4 (four) times daily. 10 days dose started on 02-25-17  . oxyCODONE-acetaminophen (PERCOCET/ROXICET) 5-325 MG per tablet Take 1 tablet by mouth every 6 (six) hours as needed for moderate pain.   Vladimir Faster Glycol-Propyl Glycol (SYSTANE ULTRA) 0.4-0.3 % SOLN Place 1 drop into both eyes daily as needed (for dry eyes).   . tacrolimus (PROTOPIC) 0.1 % ointment Apply 1 application topically daily.  Marland Kitchen tiZANidine (ZANAFLEX) 4 MG tablet Take 4 mg by mouth every 8 (eight) hours as needed for muscle spasms.  Marland Kitchen tretinoin (RETIN-A) 0.05 % cream Apply 1 application topically at bedtime.   . triamcinolone cream (KENALOG) 0.1 % Apply 1 application topically 2 (two) times daily as needed (outbreaks).   . [DISCONTINUED] albuterol (PROVENTIL HFA;VENTOLIN HFA) 108 (90  Base) MCG/ACT inhaler Inhale 2 puffs into the lungs daily.  . [DISCONTINUED] cetirizine (ZYRTEC) 10 MG tablet Take 1 tablet (10 mg total) by mouth daily.  . [DISCONTINUED] clarithromycin (BIAXIN) 500 MG tablet Take 1 tablet (500 mg total) by mouth 2 (two) times daily.  . [DISCONTINUED] diclofenac sodium (VOLTAREN) 1 % GEL Apply 1 application topically 2 (two) times daily as needed (for pain).  . [DISCONTINUED] gabapentin (NEURONTIN) 300 MG capsule Take 1 capsule (300 mg total) by mouth 3 (three) times daily.  . [DISCONTINUED] montelukast (SINGULAIR)  10 MG tablet Take 1 tablet (10 mg total) by mouth at bedtime.  . [DISCONTINUED] norethindrone (MICRONOR,CAMILA,ERRIN) 0.35 MG tablet Take 1 tablet (0.35 mg total) by mouth daily.  . [DISCONTINUED] phentermine (ADIPEX-P) 37.5 MG tablet Take 1 tablet (37.5 mg total) by mouth daily before breakfast.  . [DISCONTINUED] antiseptic oral rinse (BIOTENE) LIQD 15 mLs by Mouth Rinse route 3 (three) times daily.   . [DISCONTINUED] predniSONE (STERAPRED UNI-PAK 21 TAB) 10 MG (21) TBPK tablet 6 day taper - take by mouth as directed for 6 days   No facility-administered encounter medications on file as of 01/21/2019.       Medical History: Past Medical History:  Diagnosis Date  . Anxiety   . Arthritis    rheumo possible  . Chiari malformation   . Depression   . Dizziness   . Headache(784.0)   . History of kidney stones   . Hypertension   . Lupus ([place])   . Lupus ([place])   . Nerve damage    NECK  . PONV (postoperative nausea and vomiting)    nausea only  . TMJ arthritis     Today's Vitals   01/21/19 1642  BP: 121/83  Pulse: (!) 103  Resp: 16  Temp: 98.2 F (36.8 C)  SpO2: 100%  Weight: 242 lb (109.8 kg)  Height: 5\' 6"  (1.676 m)   Body mass index is 39.06 kg/m.   Review of Systems  Constitutional: Positive for chills and fatigue.  HENT: Positive for congestion, ear pain, hoarse voice, postnasal drip, rhinorrhea, sinus pain, sore throat and voice change.   Respiratory: Positive for cough and wheezing.   Cardiovascular: Negative for chest pain and palpitations.  Allergic/Immunologic: Positive for environmental allergies.  Neurological: Positive for headaches.    Physical Exam Vitals signs and nursing note reviewed.  Constitutional:      General: She is not in acute distress.    Appearance: She is well-developed. She is ill-appearing. She is not diaphoretic.  HENT:     Head: Normocephalic and atraumatic.     Right Ear: Tympanic membrane is erythematous and bulging.     Left  Ear: Tympanic membrane is erythematous and bulging.     Nose: Congestion present.     Right Sinus: Maxillary sinus tenderness present.     Left Sinus: Maxillary sinus tenderness present.     Mouth/Throat:     Pharynx: Posterior oropharyngeal erythema present. No oropharyngeal exudate.  Eyes:     Pupils: Pupils are equal, round, and reactive to light.  Neck:     Musculoskeletal: Normal range of motion and neck supple.     Thyroid: No thyromegaly.     Vascular: No JVD.     Trachea: No tracheal deviation.  Cardiovascular:     Rate and Rhythm: Normal rate and regular rhythm.     Heart sounds: Normal heart sounds. No murmur. No friction rub. No gallop.   Pulmonary:  Effort: Pulmonary effort is normal. No respiratory distress.     Breath sounds: Normal breath sounds. No wheezing or rales.  Chest:     Chest wall: No tenderness.  Abdominal:     General: Bowel sounds are normal.     Palpations: Abdomen is soft.  Musculoskeletal: Normal range of motion.  Lymphadenopathy:     Cervical: Cervical adenopathy present.  Skin:    General: Skin is warm and dry.  Neurological:     Mental Status: She is alert and oriented to person, place, and time.     Cranial Nerves: No cranial nerve deficit.  Psychiatric:        Behavior: Behavior normal.        Thought Content: Thought content normal.        Judgment: Judgment normal.   Assessment/Plan: 1. Acute recurrent sinusitis, unspecified location Start biaxin 500mg  twice daily for 10 days. Rest and increase fluids. Use OTC medication to alleviate symptoms. Add medrol dose pack. Take as directed for 6 days.  - clarithromycin (BIAXIN) 500 MG tablet; Take 1 tablet (500 mg total) by mouth 2 (two) times daily.  Dispense: 20 tablet; Refill: 0  2. Essential hypertension Continue blood pressure medication as prescribed   General Counseling: Karly verbalizes understanding of the findings of todays visit and agrees with plan of treatment. I have  discussed any further diagnostic evaluation that may be needed or ordered today. We also reviewed her medications today. she has been encouraged to call the office with any questions or concerns that should arise related to todays visit.    Counseling:  Rest and increase fluids. Continue using OTC medication to control symptoms.   This patient was seen by Barney with Dr Lavera Guise as a part of collaborative care agreement  Meds ordered this encounter  Medications  . clarithromycin (BIAXIN) 500 MG tablet    Sig: Take 1 tablet (500 mg total) by mouth 2 (two) times daily.    Dispense:  20 tablet    Refill:  0    Order Specific Question:   Supervising Provider    Answer:   Lavera Guise [[id number]]  . DISCONTD: predniSONE (STERAPRED UNI-PAK 21 TAB) 10 MG (21) TBPK tablet    Sig: 6 day taper - take by mouth as directed for 6 days    Dispense:  21 tablet    Refill:  0    Order Specific Question:   Supervising Provider    Answer:   Lavera Guise [[id number]]    Time spent: 25 Minutes

## 2019-01-28 ENCOUNTER — Other Ambulatory Visit: Payer: Self-pay

## 2019-01-28 MED ORDER — DICLOFENAC SODIUM 1 % TD GEL: 1.0000 "application " | Freq: Two times a day (BID) | TRANSDERMAL | 3 refills | Status: DC | PRN

## 2019-02-03 ENCOUNTER — Telehealth (INDEPENDENT_AMBULATORY_CARE_PROVIDER_SITE_OTHER): Payer: Medicare Other

## 2019-02-03 DIAGNOSIS — J0191 Acute recurrent sinusitis, unspecified: Secondary | ICD-10-CM

## 2019-02-03 DIAGNOSIS — Z6841 Body Mass Index (BMI) 40.0 and over, adult: Secondary | ICD-10-CM

## 2019-02-03 MED ORDER — PHENTERMINE HCL 37.5 MG PO TABS: 37.5000 mg | ORAL_TABLET | Freq: Every day | ORAL | 1 refills | Status: DC

## 2019-02-03 MED ORDER — PREDNISONE 10 MG (21) PO TBPK: ORAL_TABLET | ORAL | 0 refills | Status: DC

## 2019-02-03 NOTE — Telephone Encounter (Signed)
Also refilled weight loss medication for this time only. Should not take this while taking prednisone, but may start when feeling better. Both prescriptions were sent to [facility] in [place]. She should have follow up scheduled.

## 2019-02-03 NOTE — Telephone Encounter (Signed)
Pt advised we send hold weight loss med while taking prednisone

## 2019-02-05 ENCOUNTER — Ambulatory Visit: Payer: Medicare Other | Admitting: Nurse Practitioner

## 2019-02-09 ENCOUNTER — Other Ambulatory Visit: Payer: Self-pay | Admitting: Nurse Practitioner

## 2019-02-09 DIAGNOSIS — R200 Anesthesia of skin: Secondary | ICD-10-CM

## 2019-02-09 DIAGNOSIS — R202 Paresthesia of skin: Secondary | ICD-10-CM

## 2019-02-09 DIAGNOSIS — M792 Neuralgia and neuritis, unspecified: Secondary | ICD-10-CM

## 2019-02-09 MED ORDER — CETIRIZINE HCL 10 MG PO TABS: 10.0000 mg | ORAL_TABLET | Freq: Every day | ORAL | 5 refills | Status: DC

## 2019-02-09 MED ORDER — GABAPENTIN 300 MG PO CAPS: 300.0000 mg | ORAL_CAPSULE | Freq: Three times a day (TID) | ORAL | 1 refills | Status: DC

## 2019-02-09 MED ORDER — NORETHINDRONE 0.35 MG PO TABS: 1.0000 | ORAL_TABLET | Freq: Every day | ORAL | 3 refills | Status: DC

## 2019-02-09 MED ORDER — MONTELUKAST SODIUM 10 MG PO TABS: 10.0000 mg | ORAL_TABLET | Freq: Every day | ORAL | 3 refills | Status: DC

## 2019-02-09 MED ORDER — ALBUTEROL SULFATE HFA 108 (90 BASE) MCG/ACT IN AERS: 2.0000 | INHALATION_SPRAY | Freq: Every day | RESPIRATORY_TRACT | 1 refills | Status: DC

## 2019-02-18 ENCOUNTER — Other Ambulatory Visit: Payer: Self-pay

## 2019-02-23 ENCOUNTER — Ambulatory Visit: Payer: Self-pay | Admitting: Nurse Practitioner

## 2019-03-12 ENCOUNTER — Other Ambulatory Visit: Payer: Self-pay | Admitting: Nurse Practitioner

## 2019-03-12 DIAGNOSIS — M329 Systemic lupus erythematosus, unspecified: Secondary | ICD-10-CM

## 2019-03-12 MED ORDER — PREDNISONE 10 MG (48) PO TBPK: ORAL_TABLET | ORAL | 0 refills | Status: DC

## 2019-03-12 NOTE — Progress Notes (Signed)
Patient calling due to flare lupus. Unable to get hold of rheumatologist. Sent prednisone taper for 12 days. Advised she contact rheumatologist on Monday for further evaluation and treatment.

## 2019-03-15 DIAGNOSIS — M329 Systemic lupus erythematosus, unspecified: Secondary | ICD-10-CM | POA: Diagnosis not present

## 2019-03-15 DIAGNOSIS — K120 Recurrent oral aphthae: Secondary | ICD-10-CM | POA: Diagnosis not present

## 2019-03-15 DIAGNOSIS — L930 Discoid lupus erythematosus: Secondary | ICD-10-CM | POA: Diagnosis not present

## 2019-04-26 DIAGNOSIS — L819 Disorder of pigmentation, unspecified: Secondary | ICD-10-CM | POA: Diagnosis not present

## 2019-04-26 DIAGNOSIS — M329 Systemic lupus erythematosus, unspecified: Secondary | ICD-10-CM | POA: Diagnosis not present

## 2019-04-26 DIAGNOSIS — L98499 Non-pressure chronic ulcer of skin of other sites with unspecified severity: Secondary | ICD-10-CM | POA: Diagnosis not present

## 2019-04-26 DIAGNOSIS — K130 Diseases of lips: Secondary | ICD-10-CM | POA: Diagnosis not present

## 2019-04-29 DIAGNOSIS — H40003 Preglaucoma, unspecified, bilateral: Secondary | ICD-10-CM | POA: Diagnosis not present

## 2019-04-29 DIAGNOSIS — M329 Systemic lupus erythematosus, unspecified: Secondary | ICD-10-CM | POA: Diagnosis not present

## 2019-04-29 DIAGNOSIS — Z79899 Other long term (current) drug therapy: Secondary | ICD-10-CM | POA: Diagnosis not present

## 2019-05-04 ENCOUNTER — Other Ambulatory Visit: Payer: Self-pay | Admitting: Cardiovascular Disease

## 2019-05-11 ENCOUNTER — Ambulatory Visit: Payer: Medicare Other | Admitting: Nurse Practitioner

## 2019-06-04 ENCOUNTER — Other Ambulatory Visit: Payer: Self-pay

## 2019-06-04 ENCOUNTER — Ambulatory Visit (INDEPENDENT_AMBULATORY_CARE_PROVIDER_SITE_OTHER): Payer: Medicare Other | Admitting: Nurse Practitioner

## 2019-06-04 ENCOUNTER — Encounter: Payer: Self-pay | Admitting: Nurse Practitioner

## 2019-06-04 VITALS — BP 131/85 | HR 100 | Resp 16 | Ht 66.0 in | Wt 245.4 lb

## 2019-06-04 DIAGNOSIS — M329 Systemic lupus erythematosus, unspecified: Secondary | ICD-10-CM

## 2019-06-04 DIAGNOSIS — Z0001 Encounter for general adult medical examination with abnormal findings: Secondary | ICD-10-CM

## 2019-06-04 DIAGNOSIS — R300 Dysuria: Secondary | ICD-10-CM | POA: Diagnosis not present

## 2019-06-04 DIAGNOSIS — J4520 Mild intermittent asthma, uncomplicated: Secondary | ICD-10-CM | POA: Diagnosis not present

## 2019-06-04 DIAGNOSIS — Z6839 Body mass index (BMI) 39.0-39.9, adult: Secondary | ICD-10-CM | POA: Diagnosis not present

## 2019-06-04 MED ORDER — PULMICORT FLEXHALER 90 MCG/ACT IN AEPB: 2.0000 | INHALATION_SPRAY | Freq: Two times a day (BID) | RESPIRATORY_TRACT | 5 refills | Status: DC

## 2019-06-04 MED ORDER — PHENTERMINE HCL 37.5 MG PO TABS: 37.5000 mg | ORAL_TABLET | Freq: Every day | ORAL | 1 refills | Status: DC

## 2019-06-04 MED ORDER — ALBUTEROL SULFATE HFA 108 (90 BASE) MCG/ACT IN AERS: 2.0000 | INHALATION_SPRAY | Freq: Every day | RESPIRATORY_TRACT | 5 refills | Status: DC

## 2019-06-04 NOTE — Progress Notes (Signed)
[facility] [place], [place] [address]  Internal MEDICINE  Office Visit Note  Patient Name: Morgan Kent  [id number]  [id number]  Date of Service: 06/19/2019   Pt is here for routine health maintenance examination   Chief Complaint  Patient presents with  . Medicare Wellness    medication refills, pt inquiring about losing weight  . Hypertension  . Depression  . Anxiety     The patient is here for health maintenance exam. She feels like her allergies may be messing with her, however, does not feel congested. She is taking her allergy medication. Does need a refill for her rescue inhaler. She had routine, fasting labs done 09/2018. Cholesterol was mildly elevated and vitamin d was a little low. Labs were otherwise good. She has pap smears done per GYN provider due to history of abnormal pap smears. She is concerned about recent weight gain. Has gained three pounds since her last visit.  She continues to see specialist for her lupus. Has had a few medication changes to keep up with flares of lupus. This has been reviewed and updated in her medication list.     Current Medication: Outpatient Encounter Medications as of 06/04/2019  Medication Sig Note  . adapalene (DIFFERIN) 0.1 % gel Apply 1 application topically at bedtime. Apply to forehead and chin   . albuterol (VENTOLIN HFA) 108 (90 Base) MCG/ACT inhaler Inhale 2 puffs into the lungs daily.   . Budesonide (PULMICORT FLEXHALER) 90 MCG/ACT inhaler Inhale 2 puffs into the lungs 2 (two) times daily.   . cetirizine (ZYRTEC) 10 MG tablet Take 1 tablet (10 mg total) by mouth daily.   . clobetasol (TEMOVATE) 0.05 % external solution Apply 1 application topically 2 (two) times daily as needed. Apply to scalp   . diclofenac sodium (VOLTAREN) 1 % GEL Apply 1 application topically 2 (two) times daily as needed (for pain).   Marland Kitchen ergocalciferol (DRISDOL) 1.25 MG (50000 UT) capsule Take 1 capsule (50,000 Units total) by  mouth once a week.   . fluticasone (FLONASE) 50 MCG/ACT nasal spray Place 1 spray into both nostrils at bedtime.   . hydrochlorothiazide (HYDRODIURIL) 25 MG tablet TAKE 1 TABLET(25 MG) BY MOUTH DAILY   . hydroxychloroquine (PLAQUENIL) 200 MG tablet Take 200 mg by mouth daily.    Marland Kitchen KETOCONAZOLE, TOPICAL, 1 % SHAM Apply 1 application topically every other day.    . lisinopril (ZESTRIL) 5 MG tablet TAKE 1 TABLET BY MOUTH EVERY DAY   . meloxicam (MOBIC) 7.5 MG tablet Take 1 tablet (7.5 mg total) by mouth 2 (two) times daily.   . mometasone (ELOCON) 0.1 % ointment Apply 1 application topically 2 (two) times daily as needed (lupus outbreaks).   . montelukast (SINGULAIR) 10 MG tablet Take 1 tablet (10 mg total) by mouth at bedtime.   . norethindrone (MICRONOR,CAMILA,ERRIN) 0.35 MG tablet Take 1 tablet (0.35 mg total) by mouth daily.   Marland Kitchen nystatin (MYCOSTATIN) 100000 UNIT/ML suspension Take 5 mLs (500,000 Units total) by mouth 4 (four) times daily. 10 days dose started on 02-25-17   . oxyCODONE-acetaminophen (PERCOCET/ROXICET) 5-325 MG per tablet Take 1 tablet by mouth every 6 (six) hours as needed for moderate pain.    Vladimir Faster Glycol-Propyl Glycol (SYSTANE ULTRA) 0.4-0.3 % SOLN Place 1 drop into both eyes daily as needed (for dry eyes).    . predniSONE (STERAPRED UNI-PAK 48 TAB) 10 MG (48) TBPK tablet 12 day taper - take by mouth as directed  for 12 days   . tacrolimus (PROTOPIC) 0.1 % ointment Apply 1 application topically daily.   Marland Kitchen tiZANidine (ZANAFLEX) 4 MG tablet Take 4 mg by mouth every 8 (eight) hours as needed for muscle spasms.   Marland Kitchen tretinoin (RETIN-A) 0.05 % cream Apply 1 application topically at bedtime.    . triamcinolone cream (KENALOG) 0.1 % Apply 1 application topically 2 (two) times daily as needed (outbreaks).    . [DISCONTINUED] albuterol (PROVENTIL HFA;VENTOLIN HFA) 108 (90 Base) MCG/ACT inhaler Inhale 2 puffs into the lungs daily.   . [DISCONTINUED] Budesonide (PULMICORT FLEXHALER) 90  MCG/ACT inhaler Inhale 2 puffs into the lungs 2 (two) times daily.   . [DISCONTINUED] carvedilol (COREG) 25 MG tablet TAKE 1 TABLET BY MOUTH TWICE A DAY   . [DISCONTINUED] clarithromycin (BIAXIN) 500 MG tablet Take 1 tablet (500 mg total) by mouth 2 (two) times daily.   . [DISCONTINUED] gabapentin (NEURONTIN) 300 MG capsule Take 1 capsule (300 mg total) by mouth 3 (three) times daily.   . [DISCONTINUED] phentermine (ADIPEX-P) 37.5 MG tablet Take 1 tablet (37.5 mg total) by mouth daily before breakfast. 06/04/2019: currently not aking .  . phentermine (ADIPEX-P) 37.5 MG tablet Take 1 tablet (37.5 mg total) by mouth daily before breakfast.    No facility-administered encounter medications on file as of 06/04/2019.     Surgical History: Past Surgical History:  Procedure Laterality Date  . carpel tunnel  Left 2014  . CRANIOTOMY  14,06   chiari  . DENTAL SURGERY    . HERNIA REPAIR     umbilical  . PLACEMENT OF LUMBAR DRAIN N/A 07/07/2014   Procedure: PLACEMENT OF LUMBAR DRAIN AND CLOSURE OF CERVICAL INCISION;  Surgeon: Newman Pies, MD;  Location: [facility];  Service: Neurosurgery;  Laterality: N/A;  . SUBOCCIPITAL CRANIECTOMY CERVICAL LAMINECTOMY N/A 09/29/2013   Procedure: SUBOCCIPITAL CRANIECTOMY CERVICAL LAMINECTOMY/DURAPLASTY;  Surgeon: Ophelia Charter, MD;  Location: [place] [facility];  Service: Neurosurgery;  Laterality: N/A;  posterior  . SUBOCCIPITAL CRANIECTOMY CERVICAL LAMINECTOMY N/A 06/23/2014   Procedure: SUBOCCIPITAL CRANIECTOMY CERVICAL LAMINECTOMY/DURAPLASTY;  Surgeon: Newman Pies, MD;  Location: [facility];  Service: Neurosurgery;  Laterality: N/A;  suboccipital craniectomy with cervical laminectomy and duraplasty    Medical History: Past Medical History:  Diagnosis Date  . Anxiety   . Arthritis    rheumo possible  . Chiari malformation   . Depression   . Dizziness   . Headache(784.0)   . History of kidney stones   . Hypertension   . Lupus ([place])   . Lupus  ([place])   . Nerve damage    NECK  . PONV (postoperative nausea and vomiting)    nausea only  . TMJ arthritis     Family History: Family History  Problem Relation Age of Onset  . Heart disease Father   . Hypertension Father   . Hypertension Mother   . Thyroid disease Mother   . Asthma Other   . Depression Other   . Diabetes Other   . Migraines Other   . Stroke Other       Review of Systems  Constitutional: Negative for activity change, chills, fatigue and unexpected weight change.       Three pound weight gain since most recent visit.   HENT: Negative for congestion, postnasal drip, rhinorrhea, sneezing and sore throat.   Respiratory: Negative for cough, chest tightness and shortness of breath.   Cardiovascular: Negative for chest pain and palpitations.  Gastrointestinal: Negative for abdominal  pain, constipation, diarrhea, nausea and vomiting.  Endocrine: Negative for cold intolerance, heat intolerance, polydipsia and polyuria.  Genitourinary: Negative for menstrual problem.  Musculoskeletal: Positive for back pain and myalgias. Negative for arthralgias, joint swelling and neck pain.       Sees pain management provider   Skin: Negative for rash.       Skin changes related to lupus.   Allergic/Immunologic: Positive for environmental allergies.  Neurological: Positive for headaches. Negative for tremors and numbness.  Hematological: Negative for adenopathy. Does not bruise/bleed easily.  Psychiatric/Behavioral: Negative for behavioral problems (Depression), sleep disturbance and suicidal ideas. The patient is not nervous/anxious.     Today's Vitals   06/04/19 1444  BP: 131/85  Pulse: 100  Resp: 16  SpO2: 98%  Weight: 245 lb 6.4 oz (111.3 kg)  Height: 5\' 6"  (1.676 m)   Body mass index is 39.61 kg/m.  Physical Exam Vitals signs and nursing note reviewed.  Constitutional:      General: She is not in acute distress.    Appearance: She is well-developed. She is  obese. She is not diaphoretic.  HENT:     Head: Normocephalic and atraumatic.     Right Ear: External ear normal.     Left Ear: External ear normal.     Nose: Nose normal.     Mouth/Throat:     Pharynx: No oropharyngeal exudate.  Eyes:     Pupils: Pupils are equal, round, and reactive to light.  Neck:     Musculoskeletal: Normal range of motion and neck supple.     Thyroid: No thyromegaly.     Vascular: No JVD.     Trachea: No tracheal deviation.  Cardiovascular:     Rate and Rhythm: Normal rate and regular rhythm.     Pulses: Normal pulses.     Heart sounds: Normal heart sounds. No murmur. No friction rub. No gallop.   Pulmonary:     Effort: Pulmonary effort is normal. No respiratory distress.     Breath sounds: Normal breath sounds. No wheezing or rales.  Chest:     Chest wall: No tenderness.  Abdominal:     General: Bowel sounds are normal.     Palpations: Abdomen is soft.     Tenderness: There is no abdominal tenderness.  Lymphadenopathy:     Cervical: No cervical adenopathy.  Skin:    General: Skin is warm and dry.  Neurological:     Mental Status: She is alert and oriented to person, place, and time. Mental status is at baseline.     Cranial Nerves: No cranial nerve deficit.  Psychiatric:        Behavior: Behavior normal.        Thought Content: Thought content normal.        Judgment: Judgment normal.    Depression screen [facility] 2/9 06/04/2019 01/21/2019 12/18/2018 12/08/2018 09/07/2018  Decreased Interest 0 0 0 0 0  Down, Depressed, Hopeless 0 0 0 0 0  PHQ - 2 Score 0 0 0 0 0    Functional Status Survey: Is the patient deaf or have difficulty hearing?: No Does the patient have difficulty seeing, even when wearing glasses/contacts?: No Does the patient have difficulty concentrating, remembering, or making decisions?: No Does the patient have difficulty walking or climbing stairs?: Yes(pt avoid stairs due to pain) Does the patient have difficulty dressing or  bathing?: No Does the patient have difficulty doing errands alone such as visiting a doctor's office or shopping?: No  MMSE - Mini Mental State Exam 06/04/2019  Orientation to time 5  Orientation to Place 5  Registration 3  Attention/ Calculation 5  Recall 3  Language- name 2 objects 2  Language- repeat 1  Language- follow 3 step command 3  Language- read & follow direction 1  Write a sentence 1  Copy design 1  Total score 30    Fall Risk  06/04/2019 01/21/2019 12/18/2018 12/08/2018 09/07/2018  Falls in the past year? 0 0 0 0 No      LABS: Recent Results (from the past 2160 hour(s))  UA/M w/rflx Culture, Routine     Status: None   Collection Time: 06/04/19  5:00 PM   Specimen: Urine   URINE  Result Value Ref Range   Specific Gravity, UA 1.020 1.005 - 1.030   pH, UA 6.0 5.0 - 7.5   Color, UA Yellow Yellow   Appearance Ur Clear Clear   Leukocytes,UA Negative Negative   Protein,UA Negative Negative/Trace   Glucose, UA Negative Negative   Ketones, UA Negative Negative   RBC, UA Negative Negative   Bilirubin, UA Negative Negative   Urobilinogen, Ur 0.2 0.2 - 1.0 mg/dL   Nitrite, UA Negative Negative   Microscopic Examination Comment     Comment: Microscopic follows if indicated.   Microscopic Examination See below:     Comment: Microscopic was indicated and was performed.   Urinalysis Reflex Comment     Comment: This specimen will not reflex to a Urine Culture.  Microscopic Examination     Status: None   Collection Time: 06/04/19  5:00 PM   URINE  Result Value Ref Range   WBC, UA 0-5 0 - 5 /hpf   RBC 0-2 0 - 2 /hpf   Epithelial Cells (non renal) 0-10 0 - 10 /hpf   Casts None seen None seen /lpf   Bacteria, UA Few None seen/Few   Assessment/Plan: 1. Encounter for annual general medical examination with abnormal findings in adult Annual health maintenance exam  2. Mild intermittent asthma without complication Continue pulmicort twice daily. Use rescue inhaler as  needed and as prescribed. Refills provided for both today.  - albuterol (VENTOLIN HFA) 108 (90 Base) MCG/ACT inhaler; Inhale 2 puffs into the lungs daily.  Dispense: 18 g; Refill: 5 - Budesonide (PULMICORT FLEXHALER) 90 MCG/ACT inhaler; Inhale 2 puffs into the lungs 2 (two) times daily.  Dispense: 1 each; Refill: 5  3. Systemic lupus erythematosus, unspecified SLE type, unspecified organ involvement status ([place]) Continue regular visits with rheumatologist and dermatologist as scheduled.   4. BMI 39.0-39.9,adult Restart phentermine 37.5mg  tablets. Limit calorie intake to 1200-1500 caloris per day. Incorporate physical activity into daily routine.  - phentermine (ADIPEX-P) 37.5 MG tablet; Take 1 tablet (37.5 mg total) by mouth daily before breakfast.  Dispense: 30 tablet; Refill: 1  5. Dysuria - UA/M w/rflx Culture, Routine  General Counseling: Louna verbalizes understanding of the findings of todays visit and agrees with plan of treatment. I have discussed any further diagnostic evaluation that may be needed or ordered today. We also reviewed her medications today. she has been encouraged to call the office with any questions or concerns that should arise related to todays visit.    Counseling:   There is a liability release in patients' chart. There has been a 10 minute discussion about the side effects including but not limited to elevated blood pressure, anxiety, lack of sleep and dry mouth. Pt understands and will like to start/continue on appetite  suppressant at this time. There will be one month RX given at the time of visit with proper follow up. Nova diet plan with restricted calories is given to the pt. Pt understands and agrees with  plan of treatment  This patient was seen by Leretha Pol FNP Collaboration with Dr Lavera Guise as a part of collaborative care agreement  Orders Placed This Encounter  Procedures  . Microscopic Examination  . UA/M w/rflx Culture, Routine     Meds ordered this encounter  Medications  . albuterol (VENTOLIN HFA) 108 (90 Base) MCG/ACT inhaler    Sig: Inhale 2 puffs into the lungs daily.    Dispense:  18 g    Refill:  5    Order Specific Question:   Supervising Provider    Answer:   Lavera Guise [[id number]]  . Budesonide (PULMICORT FLEXHALER) 90 MCG/ACT inhaler    Sig: Inhale 2 puffs into the lungs 2 (two) times daily.    Dispense:  1 each    Refill:  5    Order Specific Question:   Supervising Provider    Answer:   Lavera Guise [[id number]]  . phentermine (ADIPEX-P) 37.5 MG tablet    Sig: Take 1 tablet (37.5 mg total) by mouth daily before breakfast.    Dispense:  30 tablet    Refill:  1    Order Specific Question:   Supervising Provider    Answer:   Lavera Guise [[id number]]    Time spent: Carmen, MD  Internal Medicine

## 2019-06-05 LAB — UA/M W/RFLX CULTURE, ROUTINE
Bilirubin, UA: NEGATIVE
Glucose, UA: NEGATIVE
Ketones, UA: NEGATIVE
Leukocytes,UA: NEGATIVE
Nitrite, UA: NEGATIVE
Protein,UA: NEGATIVE
RBC, UA: NEGATIVE
Specific Gravity, UA: 1.02 (ref 1.005–1.030)
Urobilinogen, Ur: 0.2 mg/dL (ref 0.2–1.0)
pH, UA: 6 (ref 5.0–7.5)

## 2019-06-05 LAB — MICROSCOPIC EXAMINATION: Casts: NONE SEEN /lpf

## 2019-06-12 ENCOUNTER — Other Ambulatory Visit: Payer: Self-pay | Admitting: Cardiovascular Disease

## 2019-06-14 ENCOUNTER — Other Ambulatory Visit: Payer: Self-pay

## 2019-06-14 DIAGNOSIS — R200 Anesthesia of skin: Secondary | ICD-10-CM

## 2019-06-14 DIAGNOSIS — M792 Neuralgia and neuritis, unspecified: Secondary | ICD-10-CM

## 2019-06-14 DIAGNOSIS — R202 Paresthesia of skin: Secondary | ICD-10-CM

## 2019-06-14 MED ORDER — GABAPENTIN 300 MG PO CAPS: 300.0000 mg | ORAL_CAPSULE | Freq: Three times a day (TID) | ORAL | 1 refills | Status: DC

## 2019-06-19 DIAGNOSIS — J4520 Mild intermittent asthma, uncomplicated: Secondary | ICD-10-CM | POA: Insufficient documentation

## 2019-06-19 DIAGNOSIS — Z6839 Body mass index (BMI) 39.0-39.9, adult: Secondary | ICD-10-CM | POA: Insufficient documentation

## 2019-07-02 ENCOUNTER — Encounter: Payer: Self-pay | Admitting: Nurse Practitioner

## 2019-07-02 ENCOUNTER — Ambulatory Visit (INDEPENDENT_AMBULATORY_CARE_PROVIDER_SITE_OTHER): Payer: Medicare Other | Admitting: Nurse Practitioner

## 2019-07-02 ENCOUNTER — Other Ambulatory Visit: Payer: Self-pay

## 2019-07-02 VITALS — BP 125/85 | HR 89 | Resp 16 | Ht 66.0 in | Wt 245.4 lb

## 2019-07-02 DIAGNOSIS — Z6839 Body mass index (BMI) 39.0-39.9, adult: Secondary | ICD-10-CM

## 2019-07-02 DIAGNOSIS — M329 Systemic lupus erythematosus, unspecified: Secondary | ICD-10-CM

## 2019-07-02 DIAGNOSIS — Q0700 Arnold-Chiari syndrome without spina bifida or hydrocephalus: Secondary | ICD-10-CM | POA: Diagnosis not present

## 2019-07-02 MED ORDER — PHENTERMINE HCL 37.5 MG PO TABS: 37.5000 mg | ORAL_TABLET | Freq: Every day | ORAL | 1 refills | Status: DC

## 2019-07-02 NOTE — Progress Notes (Signed)
[facility] [place], [place] [address]  Internal MEDICINE  Office Visit Note  Patient Name: Morgan Kent  [id number]  [id number]  Date of Service: 07/02/2019  Chief Complaint  Patient presents with  . Follow-up    weight management and urine culture results  . Medication Management    pt is prescribed tizanidine by neurologist, but would like to know if you can take the medication over. also patient wants more nystatin supension.  . Hand Pain    numb, cold, blue tips of fingers.    The patient is here for routine follow up. Today, she asks if I will take over prescribing tizanidine. She does have chiari malformation of the neck/skull. She has had multiple surgeries to correct this, however, still has fluid accumulation around the base of the skull. The last time she saw the neurosurgeon, he told her she still had fluid accumulation, but was unwilling to do any further surgical intervention. She is not willing to have any further surgery at this time. She will need to continue to see the neurosurgeon for annual check ups.  She does have lupus. She sees dermatology and has had neurosurgery. She has been experiencing increased numbness of her fingertips of both hands. Has also noted blue coloring to the skin of the fingertips. She states that she has never experienced this in the past.  The patient was restarted on phentermine at her last visit. She had been gaining approximately three pounds each month. Since starting back, she has not gained more weight, however, she has not lost weight. She has increased thirst, but no other negative side effects from this medication.       Current Medication: Outpatient Encounter Medications as of 07/02/2019  Medication Sig  . adapalene (DIFFERIN) 0.1 % gel Apply 1 application topically at bedtime. Apply to forehead and chin  . albuterol (VENTOLIN HFA) 108 (90 Base) MCG/ACT inhaler Inhale 2 puffs into the lungs daily.  .  Budesonide (PULMICORT FLEXHALER) 90 MCG/ACT inhaler Inhale 2 puffs into the lungs 2 (two) times daily.  . carvedilol (COREG) 25 MG tablet TAKE 1 TABLET BY MOUTH TWICE A DAY  . cetirizine (ZYRTEC) 10 MG tablet Take 1 tablet (10 mg total) by mouth daily.  . clobetasol (TEMOVATE) 0.05 % external solution Apply 1 application topically 2 (two) times daily as needed. Apply to scalp  . diclofenac sodium (VOLTAREN) 1 % GEL Apply 1 application topically 2 (two) times daily as needed (for pain).  Marland Kitchen ergocalciferol (DRISDOL) 1.25 MG (50000 UT) capsule Take 1 capsule (50,000 Units total) by mouth once a week.  . fluticasone (FLONASE) 50 MCG/ACT nasal spray Place 1 spray into both nostrils at bedtime.  . gabapentin (NEURONTIN) 300 MG capsule Take 1 capsule (300 mg total) by mouth 3 (three) times daily.  . hydrochlorothiazide (HYDRODIURIL) 25 MG tablet TAKE 1 TABLET(25 MG) BY MOUTH DAILY  . hydroxychloroquine (PLAQUENIL) 200 MG tablet Take 200 mg by mouth daily.   Marland Kitchen KETOCONAZOLE, TOPICAL, 1 % SHAM Apply 1 application topically every other day.   . lisinopril (ZESTRIL) 5 MG tablet TAKE 1 TABLET BY MOUTH EVERY DAY  . meloxicam (MOBIC) 7.5 MG tablet Take 1 tablet (7.5 mg total) by mouth 2 (two) times daily.  . mometasone (ELOCON) 0.1 % ointment Apply 1 application topically 2 (two) times daily as needed (lupus outbreaks).  . montelukast (SINGULAIR) 10 MG tablet Take 1 tablet (10 mg total) by mouth at bedtime.  . norethindrone (MICRONOR,CAMILA,ERRIN)  0.35 MG tablet Take 1 tablet (0.35 mg total) by mouth daily.  Marland Kitchen nystatin (MYCOSTATIN) 100000 UNIT/ML suspension Take 5 mLs (500,000 Units total) by mouth 4 (four) times daily. 10 days dose started on 02-25-17  . oxyCODONE-acetaminophen (PERCOCET/ROXICET) 5-325 MG per tablet Take 1 tablet by mouth every 6 (six) hours as needed for moderate pain.   . phentermine (ADIPEX-P) 37.5 MG tablet Take 1 tablet (37.5 mg total) by mouth daily before breakfast.  . Polyethyl  Glycol-Propyl Glycol (SYSTANE ULTRA) 0.4-0.3 % SOLN Place 1 drop into both eyes daily as needed (for dry eyes).   . tacrolimus (PROTOPIC) 0.1 % ointment Apply 1 application topically daily.  Marland Kitchen tiZANidine (ZANAFLEX) 4 MG tablet Take 4 mg by mouth every 8 (eight) hours as needed for muscle spasms.  Marland Kitchen tretinoin (RETIN-A) 0.05 % cream Apply 1 application topically at bedtime.   . triamcinolone cream (KENALOG) 0.1 % Apply 1 application topically 2 (two) times daily as needed (outbreaks).   . [DISCONTINUED] phentermine (ADIPEX-P) 37.5 MG tablet Take 1 tablet (37.5 mg total) by mouth daily before breakfast.  . [DISCONTINUED] predniSONE (STERAPRED UNI-PAK 48 TAB) 10 MG (48) TBPK tablet 12 day taper - take by mouth as directed for 12 days (Patient not taking: Reported on 07/02/2019)   No facility-administered encounter medications on file as of 07/02/2019.     Surgical History: Past Surgical History:  Procedure Laterality Date  . carpel tunnel  Left 2014  . CRANIOTOMY  14,06   chiari  . DENTAL SURGERY    . HERNIA REPAIR     umbilical  . PLACEMENT OF LUMBAR DRAIN N/A 07/07/2014   Procedure: PLACEMENT OF LUMBAR DRAIN AND CLOSURE OF CERVICAL INCISION;  Surgeon: Newman Pies, MD;  Location: [place] [facility];  Service: Neurosurgery;  Laterality: N/A;  . SUBOCCIPITAL CRANIECTOMY CERVICAL LAMINECTOMY N/A 09/29/2013   Procedure: SUBOCCIPITAL CRANIECTOMY CERVICAL LAMINECTOMY/DURAPLASTY;  Surgeon: Ophelia Charter, MD;  Location: [place] [facility];  Service: Neurosurgery;  Laterality: N/A;  posterior  . SUBOCCIPITAL CRANIECTOMY CERVICAL LAMINECTOMY N/A 06/23/2014   Procedure: SUBOCCIPITAL CRANIECTOMY CERVICAL LAMINECTOMY/DURAPLASTY;  Surgeon: Newman Pies, MD;  Location: [place] [facility];  Service: Neurosurgery;  Laterality: N/A;  suboccipital craniectomy with cervical laminectomy and duraplasty    Medical History: Past Medical History:  Diagnosis Date  . Anxiety   . Arthritis    rheumo possible  . Chiari  malformation   . Depression   . Dizziness   . Headache(784.0)   . History of kidney stones   . Hypertension   . Lupus ([place])   . Lupus ([place])   . Nerve damage    NECK  . PONV (postoperative nausea and vomiting)    nausea only  . TMJ arthritis     Family History: Family History  Problem Relation Age of Onset  . Heart disease Father   . Hypertension Father   . Hypertension Mother   . Thyroid disease Mother   . Asthma Other   . Depression Other   . Diabetes Other   . Migraines Other   . Stroke Other     Social History   Socioeconomic History  . Marital status: Single    Spouse name: Not on file  . Number of children: Not on file  . Years of education: Not on file  . Highest education level: Not on file  Occupational History  . Not on file  Social Needs  . Financial resource strain: Not on file  . Food insecurity  Worry: Not on file    Inability: Not on file  . Transportation needs    Medical: Not on file    Non-medical: Not on file  Tobacco Use  . Smoking status: Never Smoker  . Smokeless tobacco: Never Used  Substance and Sexual Activity  . Alcohol use: No    Alcohol/week: 0.0 standard drinks  . Drug use: No  . Sexual activity: Not on file  Lifestyle  . Physical activity    Days per week: Not on file    Minutes per session: Not on file  . Stress: Not on file  Relationships  . Social Herbalist on phone: Not on file    Gets together: Not on file    Attends religious service: Not on file    Active member of club or organization: Not on file    Attends meetings of clubs or organizations: Not on file    Relationship status: Not on file  . Intimate partner violence    Fear of current or ex partner: Not on file    Emotionally abused: Not on file    Physically abused: Not on file    Forced sexual activity: Not on file  Other Topics Concern  . Not on file  Social History Narrative   Lives with 3 children in 2 story home.  Unemployed.  Trying  to get disability.  Education: high school.      Review of Systems  Constitutional: Positive for fatigue. Negative for activity change, chills and unexpected weight change.       No weight gain or weight loss since her last visit.   HENT: Negative for congestion, postnasal drip, rhinorrhea, sneezing and sore throat.   Respiratory: Negative for cough, chest tightness and shortness of breath.   Cardiovascular: Negative for chest pain and palpitations.  Gastrointestinal: Negative for abdominal pain, constipation, diarrhea, nausea and vomiting.  Endocrine: Negative for cold intolerance, heat intolerance, polydipsia and polyuria.  Genitourinary: Negative for menstrual problem.  Musculoskeletal: Positive for back pain and myalgias. Negative for arthralgias, joint swelling and neck pain.       Sees pain management provider   Skin: Negative for rash.       Skin changes related to lupus.   Allergic/Immunologic: Positive for environmental allergies.  Neurological: Positive for weakness, numbness and headaches. Negative for tremors.       Has noted numbness and burning to the tips of her fingers. Has also noted blue discoloration and cold feeling to the fingertips.   Hematological: Negative for adenopathy. Does not bruise/bleed easily.  Psychiatric/Behavioral: Negative for behavioral problems (Depression), sleep disturbance and suicidal ideas. The patient is nervous/anxious.     Today's Vitals   07/02/19 0849  BP: 125/85  Pulse: 89  Resp: 16  SpO2: 98%  Weight: 245 lb 6.4 oz (111.3 kg)  Height: 5\' 6"  (1.676 m)   Body mass index is 39.61 kg/m.  Physical Exam Vitals signs and nursing note reviewed.  Constitutional:      General: She is not in acute distress.    Appearance: She is well-developed. She is obese. She is not diaphoretic.  HENT:     Head: Normocephalic and atraumatic.     Right Ear: External ear normal.     Left Ear: External ear normal.     Nose: Nose normal.      Mouth/Throat:     Pharynx: No oropharyngeal exudate.  Eyes:     Pupils: Pupils are equal, round,  and reactive to light.  Neck:     Musculoskeletal: Normal range of motion and neck supple.     Thyroid: No thyromegaly.     Vascular: No JVD.     Trachea: No tracheal deviation.  Cardiovascular:     Rate and Rhythm: Normal rate and regular rhythm.     Pulses: Normal pulses.     Heart sounds: Normal heart sounds. No murmur. No friction rub. No gallop.   Pulmonary:     Effort: Pulmonary effort is normal. No respiratory distress.     Breath sounds: Normal breath sounds. No wheezing or rales.  Chest:     Chest wall: No tenderness.  Abdominal:     General: Bowel sounds are normal.     Palpations: Abdomen is soft.     Tenderness: There is no abdominal tenderness.  Lymphadenopathy:     Cervical: No cervical adenopathy.  Skin:    General: Skin is warm and dry.  Neurological:     Mental Status: She is alert and oriented to person, place, and time. Mental status is at baseline.     Cranial Nerves: No cranial nerve deficit.  Psychiatric:        Behavior: Behavior normal.        Thought Content: Thought content normal.        Judgment: Judgment normal.    Assessment/Plan: 1. Arnold-Chiari syndrome ([place]) Discussed d/c follow up with neurosurgeon. Advised her to keep regular, annual visits with neurosurgeon due to complicated disease process.  2. Systemic lupus erythematosus, unspecified SLE type, unspecified organ involvement status ([place]) Continue regular visits with dermatology and rheumatology.  3. BMI 39.0-39.9,adult May take phentermine 37.5mg  daily. Limit calorie intake to 1500 calories per day and recommend low-impact exercise on daily basis.  - phentermine (ADIPEX-P) 37.5 MG tablet; Take 1 tablet (37.5 mg total) by mouth daily before breakfast.  Dispense: 30 tablet; Refill: 1  General Counseling: Wrenley verbalizes understanding of the findings of todays visit and agrees with plan  of treatment. I have discussed any further diagnostic evaluation that may be needed or ordered today. We also reviewed her medications today. she has been encouraged to call the office with any questions or concerns that should arise related to todays visit.   There is a liability release in patients' chart. There has been a 10 minute discussion about the side effects including but not limited to elevated blood pressure, anxiety, lack of sleep and dry mouth. Pt understands and will like to start/continue on appetite suppressant at this time. There will be one month RX given at the time of visit with proper follow up. [place] diet plan with restricted calories is given to the pt. Pt understands and agrees with  plan of treatment  This patient was seen by Leretha Pol FNP Collaboration with Dr Lavera Guise as a part of collaborative care agreement  Meds ordered this encounter  Medications  . phentermine (ADIPEX-P) 37.5 MG tablet    Sig: Take 1 tablet (37.5 mg total) by mouth daily before breakfast.    Dispense:  30 tablet    Refill:  1    Order Specific Question:   Supervising Provider    Answer:   Lavera Guise [id number]    Time spent: 52 Minutes      Dr Lavera Guise Internal medicine

## 2019-07-02 NOTE — Patient Instructions (Signed)
Calorie Counting for Weight Loss Calories are units of energy. Your body needs a certain amount of calories from food to keep you going throughout the day. When you eat more calories than your body needs, your body stores the extra calories as fat. When you eat fewer calories than your body needs, your body burns fat to get the energy it needs. Calorie counting means keeping track of how many calories you eat and drink each day. Calorie counting can be helpful if you need to lose weight. If you make sure to eat fewer calories than your body needs, you should lose weight. Ask your health care provider what a healthy weight is for you. For calorie counting to work, you will need to eat the right number of calories in a day in order to lose a healthy amount of weight per week. A dietitian can help you determine how many calories you need in a day and will give you suggestions on how to reach your calorie goal.  A healthy amount of weight to lose per week is usually 1-2 lb (0.5-0.9 kg). This usually means that your daily calorie intake should be reduced by 500-750 calories.  Eating 1,200 - 1,500 calories per day can help most women lose weight.  Eating 1,500 - 1,800 calories per day can help most men lose weight. What is my plan? My goal is to have __________ calories per day. If I have this many calories per day, I should lose around __________ pounds per week. What do I need to know about calorie counting? In order to meet your daily calorie goal, you will need to:  Find out how many calories are in each food you would like to eat. Try to do this before you eat.  Decide how much of the food you plan to eat.  Write down what you ate and how many calories it had. Doing this is called keeping a food log. To successfully lose weight, it is important to balance calorie counting with a healthy lifestyle that includes regular activity. Aim for 150 minutes of moderate exercise (such as walking) or 75  minutes of vigorous exercise (such as running) each week. Where do I find calorie information?  The number of calories in a food can be found on a Nutrition Facts label. If a food does not have a Nutrition Facts label, try to look up the calories online or ask your dietitian for help. Remember that calories are listed per serving. If you choose to have more than one serving of a food, you will have to multiply the calories per serving by the amount of servings you plan to eat. For example, the label on a package of bread might say that a serving size is 1 slice and that there are 90 calories in a serving. If you eat 1 slice, you will have eaten 90 calories. If you eat 2 slices, you will have eaten 180 calories. How do I keep a food log? Immediately after each meal, record the following information in your food log:  What you ate. Don't forget to include toppings, sauces, and other extras on the food.  How much you ate. This can be measured in cups, ounces, or number of items.  How many calories each food and drink had.  The total number of calories in the meal. Keep your food log near you, such as in a small notebook in your pocket, or use a mobile app or website. Some programs will calculate   calories for you and show you how many calories you have left for the day to meet your goal. What are some calorie counting tips?   Use your calories on foods and drinks that will fill you up and not leave you hungry: ? Some examples of foods that fill you up are nuts and nut butters, vegetables, lean proteins, and high-fiber foods like whole grains. High-fiber foods are foods with more than 5 g fiber per serving. ? Drinks such as sodas, specialty coffee drinks, alcohol, and juices have a lot of calories, yet do not fill you up.  Eat nutritious foods and avoid empty calories. Empty calories are calories you get from foods or beverages that do not have many vitamins or protein, such as candy, sweets, and  soda. It is better to have a nutritious high-calorie food (such as an avocado) than a food with few nutrients (such as a bag of chips).  Know how many calories are in the foods you eat most often. This will help you calculate calorie counts faster.  Pay attention to calories in drinks. Low-calorie drinks include water and unsweetened drinks.  Pay attention to nutrition labels for "low fat" or "fat free" foods. These foods sometimes have the same amount of calories or more calories than the full fat versions. They also often have added sugar, starch, or salt, to make up for flavor that was removed with the fat.  Find a way of tracking calories that works for you. Get creative. Try different apps or programs if writing down calories does not work for you. What are some portion control tips?  Know how many calories are in a serving. This will help you know how many servings of a certain food you can have.  Use a measuring cup to measure serving sizes. You could also try weighing out portions on a kitchen scale. With time, you will be able to estimate serving sizes for some foods.  Take some time to put servings of different foods on your favorite plates, bowls, and cups so you know what a serving looks like.  Try not to eat straight from a bag or box. Doing this can lead to overeating. Put the amount you would like to eat in a cup or on a plate to make sure you are eating the right portion.  Use smaller plates, glasses, and bowls to prevent overeating.  Try not to multitask (for example, watch TV or use your computer) while eating. If it is time to eat, sit down at a table and enjoy your food. This will help you to know when you are full. It will also help you to be aware of what you are eating and how much you are eating. What are tips for following this plan? Reading food labels  Check the calorie count compared to the serving size. The serving size may be smaller than what you are used to  eating.  Check the source of the calories. Make sure the food you are eating is high in vitamins and protein and low in saturated and trans fats. Shopping  Read nutrition labels while you shop. This will help you make healthy decisions before you decide to purchase your food.  Make a grocery list and stick to it. Cooking  Try to cook your favorite foods in a healthier way. For example, try baking instead of frying.  Use low-fat dairy products. Meal planning  Use more fruits and vegetables. Half of your plate should be fruits   and vegetables.  Include lean proteins like poultry and fish. How do I count calories when eating out?  Ask for smaller portion sizes.  Consider sharing an entree and sides instead of getting your own entree.  If you get your own entree, eat only half. Ask for a box at the beginning of your meal and put the rest of your entree in it so you are not tempted to eat it.  If calories are listed on the menu, choose the lower calorie options.  Choose dishes that include vegetables, fruits, whole grains, low-fat dairy products, and lean protein.  Choose items that are boiled, broiled, grilled, or steamed. Stay away from items that are buttered, battered, fried, or served with cream sauce. Items labeled "crispy" are usually fried, unless stated otherwise.  Choose water, low-fat milk, unsweetened iced tea, or other drinks without added sugar. If you want an alcoholic beverage, choose a lower calorie option such as a glass of wine or light beer.  Ask for dressings, sauces, and syrups on the side. These are usually high in calories, so you should limit the amount you eat.  If you want a salad, choose a garden salad and ask for grilled meats. Avoid extra toppings like bacon, cheese, or fried items. Ask for the dressing on the side, or ask for olive oil and vinegar or lemon to use as dressing.  Estimate how many servings of a food you are given. For example, a serving of  cooked rice is  cup or about the size of half a baseball. Knowing serving sizes will help you be aware of how much food you are eating at restaurants. The list below tells you how big or small some common portion sizes are based on everyday objects: ? 1 oz-4 stacked dice. ? 3 oz-1 deck of cards. ? 1 tsp-1 die. ? 1 Tbsp- a ping-pong ball. ? 2 Tbsp-1 ping-pong ball. ?  cup- baseball. ? 1 cup-1 baseball. Summary  Calorie counting means keeping track of how many calories you eat and drink each day. If you eat fewer calories than your body needs, you should lose weight.  A healthy amount of weight to lose per week is usually 1-2 lb (0.5-0.9 kg). This usually means reducing your daily calorie intake by 500-750 calories.  The number of calories in a food can be found on a Nutrition Facts label. If a food does not have a Nutrition Facts label, try to look up the calories online or ask your dietitian for help.  Use your calories on foods and drinks that will fill you up, and not on foods and drinks that will leave you hungry.  Use smaller plates, glasses, and bowls to prevent overeating. This information is not intended to replace advice given to you by your health care provider. Make sure you discuss any questions you have with your health care provider. Document Released: 10/28/2005 Document Revised: 07/17/2018 Document Reviewed: 09/27/2016 Elsevier Patient Education  2020 [employer].  Exercising to Stay Healthy To become healthy and stay healthy, it is recommended that you do moderate-intensity and vigorous-intensity exercise. You can tell that you are exercising at a moderate intensity if your heart starts beating faster and you start breathing faster but can still hold a conversation. You can tell that you are exercising at a vigorous intensity if you are breathing much harder and faster and cannot hold a conversation while exercising. Exercising regularly is important. It has many health  benefits, such as:  Improving  overall fitness, flexibility, and endurance.  Increasing bone density.  Helping with weight control.  Decreasing body fat.  Increasing muscle strength.  Reducing stress and tension.  Improving overall health. How often should I exercise? Choose an activity that you enjoy, and set realistic goals. Your health care provider can help you make an activity plan that works for you. Exercise regularly as told by your health care provider. This may include:  Doing strength training two times a week, such as: ? Lifting weights. ? Using resistance bands. ? Push-ups. ? Sit-ups. ? Yoga.  Doing a certain intensity of exercise for a given amount of time. Choose from these options: ? A total of 150 minutes of moderate-intensity exercise every week. ? A total of 75 minutes of vigorous-intensity exercise every week. ? A mix of moderate-intensity and vigorous-intensity exercise every week. Children, pregnant women, people who have not exercised regularly, people who are overweight, and older adults may need to talk with a health care provider about what activities are safe to do. If you have a medical condition, be sure to talk with your health care provider before you start a new exercise program. What are some exercise ideas? Moderate-intensity exercise ideas include:  Walking 1 mile (1.6 km) in about 15 minutes.  Biking.  Hiking.  Golfing.  Dancing.  Water aerobics. Vigorous-intensity exercise ideas include:  Walking 4.5 miles (7.2 km) or more in about 1 hour.  Jogging or running 5 miles (8 km) in about 1 hour.  Biking 10 miles (16.1 km) or more in about 1 hour.  Lap swimming.  Roller-skating or in-line skating.  Cross-country skiing.  Vigorous competitive sports, such as football, basketball, and soccer.  Jumping rope.  Aerobic dancing. What are some everyday activities that can help me to get exercise?  [place] work, such as: ? Pushing  a Conservation officer, nature. ? Raking and bagging leaves.  Washing your car.  Pushing a stroller.  Shoveling snow.  Gardening.  Washing windows or floors. How can I be more active in my day-to-day activities?  Use stairs instead of an elevator.  Take a walk during your lunch break.  If you drive, park your car farther away from your work or school.  If you take public transportation, get off one stop early and walk the rest of the way.  Stand up or walk around during all of your indoor phone calls.  Get up, stretch, and walk around every 30 minutes throughout the day.  Enjoy exercise with a friend. Support to continue exercising will help you keep a regular routine of activity. What guidelines can I follow while exercising?  Before you start a new exercise program, talk with your health care provider.  Do not exercise so much that you hurt yourself, feel dizzy, or get very short of breath.  Wear comfortable clothes and wear shoes with good support.  Drink plenty of water while you exercise to prevent dehydration or heat stroke.  Work out until your breathing and your heartbeat get faster. Where to find more information  U.S. Department of Health and Human Services: [employer].at  Centers for Disease Control and Prevention (CDC): http://www.wolf.info/ Summary  Exercising regularly is important. It will improve your overall fitness, flexibility, and endurance.  Regular exercise also will improve your overall health. It can help you control your weight, reduce stress, and improve your bone density.  Do not exercise so much that you hurt yourself, feel dizzy, or get very short of breath.  Before you  start a new exercise program, talk with your health care provider. This information is not intended to replace advice given to you by your health care provider. Make sure you discuss any questions you have with your health care provider. Document Released: 11/30/2010 Document Revised: 10/10/2017  Document Reviewed: 09/18/2017 Elsevier Patient Education  2020 [employer].

## 2019-07-22 ENCOUNTER — Ambulatory Visit: Payer: Medicare Other | Admitting: Nurse Practitioner

## 2019-07-22 ENCOUNTER — Other Ambulatory Visit: Payer: Self-pay

## 2019-07-22 ENCOUNTER — Encounter: Payer: Self-pay | Admitting: Nurse Practitioner

## 2019-07-22 VITALS — Temp 98.5°F | Ht 66.0 in

## 2019-07-22 DIAGNOSIS — J4520 Mild intermittent asthma, uncomplicated: Secondary | ICD-10-CM

## 2019-07-22 DIAGNOSIS — J0191 Acute recurrent sinusitis, unspecified: Secondary | ICD-10-CM | POA: Diagnosis not present

## 2019-07-22 DIAGNOSIS — M329 Systemic lupus erythematosus, unspecified: Secondary | ICD-10-CM | POA: Diagnosis not present

## 2019-07-22 MED ORDER — AZITHROMYCIN 250 MG PO TABS: ORAL_TABLET | ORAL | 0 refills | Status: DC

## 2019-07-22 MED ORDER — PREDNISONE 10 MG (21) PO TBPK: ORAL_TABLET | ORAL | 0 refills | Status: DC

## 2019-07-22 NOTE — Progress Notes (Signed)
[facility] [place], [place] [address]  Internal MEDICINE  Telephone Visit  Patient Name: Morgan Kent  [id number]  [id number]  Date of Service: 07/31/2019  I connected with the patient at 12:22pm by webcam and verified the patients identity using two identifiers.   I discussed the limitations, risks, security and privacy concerns of performing an evaluation and management service by webcam and the availability of in person appointments. I also discussed with the patient that there may be a patient responsible charge related to the service.  The patient expressed understanding and agrees to proceed.    Chief Complaint  Patient presents with  . Telephone Assessment  . Telephone Screen  . Nasal Congestion    stuffy nose  . Headache  . Cough    dry cough at nighttime    The patient has been contacted via webcam for follow up visit due to concerns for spread of novel coronavirus. The patient is complaining of nasal congestion and sinus pain and pressure. She has moderate headache. She hsa had symptoms for about three days. She has been drinking lemon ginger tea and increased water intake. Today, she is congested in the chest. She deneis fever. Does not believe she has been exposed to anyone who has COVID 19.       Current Medication: Outpatient Encounter Medications as of 07/22/2019  Medication Sig  . adapalene (DIFFERIN) 0.1 % gel Apply 1 application topically at bedtime. Apply to forehead and chin  . albuterol (VENTOLIN HFA) 108 (90 Base) MCG/ACT inhaler Inhale 2 puffs into the lungs daily.  . Budesonide (PULMICORT FLEXHALER) 90 MCG/ACT inhaler Inhale 2 puffs into the lungs 2 (two) times daily.  . carvedilol (COREG) 25 MG tablet TAKE 1 TABLET BY MOUTH TWICE A DAY  . cetirizine (ZYRTEC) 10 MG tablet Take 1 tablet (10 mg total) by mouth daily.  . clobetasol (TEMOVATE) 0.05 % external solution Apply 1 application topically 2 (two) times daily as needed.  Apply to scalp  . diclofenac sodium (VOLTAREN) 1 % GEL Apply 1 application topically 2 (two) times daily as needed (for pain).  Marland Kitchen ergocalciferol (DRISDOL) 1.25 MG (50000 UT) capsule Take 1 capsule (50,000 Units total) by mouth once a week.  . fluticasone (FLONASE) 50 MCG/ACT nasal spray Place 1 spray into both nostrils at bedtime.  . gabapentin (NEURONTIN) 300 MG capsule Take 1 capsule (300 mg total) by mouth 3 (three) times daily.  . hydrochlorothiazide (HYDRODIURIL) 25 MG tablet TAKE 1 TABLET(25 MG) BY MOUTH DAILY  . hydroxychloroquine (PLAQUENIL) 200 MG tablet Take 200 mg by mouth daily.   Marland Kitchen KETOCONAZOLE, TOPICAL, 1 % SHAM Apply 1 application topically every other day.   . lisinopril (ZESTRIL) 5 MG tablet TAKE 1 TABLET BY MOUTH EVERY DAY  . meloxicam (MOBIC) 7.5 MG tablet Take 1 tablet (7.5 mg total) by mouth 2 (two) times daily.  . mometasone (ELOCON) 0.1 % ointment Apply 1 application topically 2 (two) times daily as needed (lupus outbreaks).  . montelukast (SINGULAIR) 10 MG tablet Take 1 tablet (10 mg total) by mouth at bedtime.  . norethindrone (MICRONOR,CAMILA,ERRIN) 0.35 MG tablet Take 1 tablet (0.35 mg total) by mouth daily.  Marland Kitchen nystatin (MYCOSTATIN) 100000 UNIT/ML suspension Take 5 mLs (500,000 Units total) by mouth 4 (four) times daily. 10 days dose started on 02-25-17  . oxyCODONE-acetaminophen (PERCOCET/ROXICET) 5-325 MG per tablet Take 1 tablet by mouth every 6 (six) hours as needed for moderate pain.   . phentermine (ADIPEX-P)  37.5 MG tablet Take 1 tablet (37.5 mg total) by mouth daily before breakfast.  . Polyethyl Glycol-Propyl Glycol (SYSTANE ULTRA) 0.4-0.3 % SOLN Place 1 drop into both eyes daily as needed (for dry eyes).   . tacrolimus (PROTOPIC) 0.1 % ointment Apply 1 application topically daily.  Marland Kitchen tiZANidine (ZANAFLEX) 4 MG tablet Take 4 mg by mouth every 8 (eight) hours as needed for muscle spasms.  Marland Kitchen tretinoin (RETIN-A) 0.05 % cream Apply 1 application topically at bedtime.    . triamcinolone cream (KENALOG) 0.1 % Apply 1 application topically 2 (two) times daily as needed (outbreaks).   Marland Kitchen azithromycin (ZITHROMAX) 250 MG tablet z-pack - take as directed for 5 days for sinus infection  . predniSONE (STERAPRED UNI-PAK 21 TAB) 10 MG (21) TBPK tablet 6 day taper - take by mouth as directed for 6 days   No facility-administered encounter medications on file as of 07/22/2019.     Surgical History: Past Surgical History:  Procedure Laterality Date  . carpel tunnel  Left 2014  . CRANIOTOMY  14,06   chiari  . DENTAL SURGERY    . HERNIA REPAIR     umbilical  . PLACEMENT OF LUMBAR DRAIN N/A 07/07/2014   Procedure: PLACEMENT OF LUMBAR DRAIN AND CLOSURE OF CERVICAL INCISION;  Surgeon: Newman Pies, MD;  Location: [facility];  Service: Neurosurgery;  Laterality: N/A;  . SUBOCCIPITAL CRANIECTOMY CERVICAL LAMINECTOMY N/A 09/29/2013   Procedure: SUBOCCIPITAL CRANIECTOMY CERVICAL LAMINECTOMY/DURAPLASTY;  Surgeon: Ophelia Charter, MD;  Location: [facility];  Service: Neurosurgery;  Laterality: N/A;  posterior  . SUBOCCIPITAL CRANIECTOMY CERVICAL LAMINECTOMY N/A 06/23/2014   Procedure: SUBOCCIPITAL CRANIECTOMY CERVICAL LAMINECTOMY/DURAPLASTY;  Surgeon: Newman Pies, MD;  Location: [facility];  Service: Neurosurgery;  Laterality: N/A;  suboccipital craniectomy with cervical laminectomy and duraplasty    Medical History: Past Medical History:  Diagnosis Date  . Anxiety   . Arthritis    rheumo possible  . Chiari malformation   . Depression   . Dizziness   . Headache(784.0)   . History of kidney stones   . Hypertension   . Lupus ([place])   . Lupus ([place])   . Nerve damage    NECK  . PONV (postoperative nausea and vomiting)    nausea only  . TMJ arthritis     Family History: Family History  Problem Relation Age of Onset  . Heart disease Father   . Hypertension Father   . Hypertension Mother   . Thyroid disease Mother   . Asthma Other   . Depression  Other   . Diabetes Other   . Migraines Other   . Stroke Other     Social History   Socioeconomic History  . Marital status: Single    Spouse name: Not on file  . Number of children: Not on file  . Years of education: Not on file  . Highest education level: Not on file  Occupational History  . Not on file  Social Needs  . Financial resource strain: Not on file  . Food insecurity    Worry: Not on file    Inability: Not on file  . Transportation needs    Medical: Not on file    Non-medical: Not on file  Tobacco Use  . Smoking status: Never Smoker  . Smokeless tobacco: Never Used  Substance and Sexual Activity  . Alcohol use: No    Alcohol/week: 0.0 standard drinks  . Drug use: No  . Sexual activity: Not on  file  Lifestyle  . Physical activity    Days per week: Not on file    Minutes per session: Not on file  . Stress: Not on file  Relationships  . Social Herbalist on phone: Not on file    Gets together: Not on file    Attends religious service: Not on file    Active member of club or organization: Not on file    Attends meetings of clubs or organizations: Not on file    Relationship status: Not on file  . Intimate partner violence    Fear of current or ex partner: Not on file    Emotionally abused: Not on file    Physically abused: Not on file    Forced sexual activity: Not on file  Other Topics Concern  . Not on file  Social History Narrative   Lives with 3 children in 2 story home.  Unemployed.  Trying to get disability.  Education: high school.      Review of Systems  Constitutional: Positive for fatigue and fever.  HENT: Positive for congestion, ear pain, postnasal drip, rhinorrhea, sinus pain and sore throat. Negative for voice change.   Respiratory: Positive for cough and wheezing.   Cardiovascular: Negative for chest pain and palpitations.  Gastrointestinal: Positive for nausea.  Musculoskeletal: Positive for arthralgias and myalgias.   Allergic/Immunologic: Positive for environmental allergies.  Neurological: Positive for headaches.    Today's Vitals   07/22/19 1135  Temp: 98.5 F (36.9 C)  Height: 5\' 6"  (1.676 m)   Body mass index is 39.61 kg/m.  Observation/Objective:  The patient is alert and oriented. She is pleasant and answers all questions appropriately. Breathing is non-labored. She is in no acute distress at this time. The patient is nasally congested and appears to feel ill.     Assessment/Plan: 1. Acute recurrent sinusitis, unspecified location Start z-pack. Take as directed for 5 days. Rest and increase fluids. Use prescribed pain medication to alleviate pain and fever. Also add prednisone taper. Take as directed for 5 days.  - predniSONE (STERAPRED UNI-PAK 21 TAB) 10 MG (21) TBPK tablet; 6 day taper - take by mouth as directed for 6 days  Dispense: 21 tablet; Refill: 0 - azithromycin (ZITHROMAX) 250 MG tablet; z-pack - take as directed for 5 days for sinus infection  Dispense: 6 tablet; Refill: 0  2. Mild intermittent asthma without complication Use inhalers as needed and as prescribed.   3. Systemic lupus erythematosus, unspecified SLE type, unspecified organ involvement status ([place]) Continue regular visits with dermatology and rheumatology as scheduled.   General Counseling: Desiraye verbalizes understanding of the findings of today's phone visit and agrees with plan of treatment. I have discussed any further diagnostic evaluation that may be needed or ordered today. We also reviewed her medications today. she has been encouraged to call the office with any questions or concerns that should arise related to todays visit.   This patient was seen by [place] with Dr Lavera Guise as a part of collaborative care agreement  Meds ordered this encounter  Medications  . predniSONE (STERAPRED UNI-PAK 21 TAB) 10 MG (21) TBPK tablet    Sig: 6 day taper - take by mouth as directed  for 6 days    Dispense:  21 tablet    Refill:  0    Order Specific Question:   Supervising Provider    Answer:   Lavera Guise [place]  .  azithromycin (ZITHROMAX) 250 MG tablet    Sig: z-pack - take as directed for 5 days for sinus infection    Dispense:  6 tablet    Refill:  0    Order Specific Question:   Supervising Provider    Answer:   Lavera Guise [id number]    Time spent: 37 Minutes    Dr Lavera Guise Internal medicine

## 2019-07-23 ENCOUNTER — Other Ambulatory Visit: Payer: Self-pay | Admitting: Neurosurgery

## 2019-07-23 DIAGNOSIS — R200 Anesthesia of skin: Secondary | ICD-10-CM | POA: Diagnosis not present

## 2019-07-23 DIAGNOSIS — M542 Cervicalgia: Secondary | ICD-10-CM | POA: Diagnosis not present

## 2019-07-23 DIAGNOSIS — G950 Syringomyelia and syringobulbia: Secondary | ICD-10-CM | POA: Diagnosis not present

## 2019-07-23 DIAGNOSIS — G935 Compression of brain: Secondary | ICD-10-CM

## 2019-07-23 DIAGNOSIS — Z6838 Body mass index (BMI) 38.0-38.9, adult: Secondary | ICD-10-CM | POA: Diagnosis not present

## 2019-07-23 DIAGNOSIS — G5603 Carpal tunnel syndrome, bilateral upper limbs: Secondary | ICD-10-CM | POA: Diagnosis not present

## 2019-07-23 DIAGNOSIS — I10 Essential (primary) hypertension: Secondary | ICD-10-CM | POA: Diagnosis not present

## 2019-08-03 ENCOUNTER — Ambulatory Visit (INDEPENDENT_AMBULATORY_CARE_PROVIDER_SITE_OTHER): Payer: Medicare Other | Admitting: Nurse Practitioner

## 2019-08-03 ENCOUNTER — Other Ambulatory Visit: Payer: Self-pay

## 2019-08-03 ENCOUNTER — Encounter: Payer: Self-pay | Admitting: Nurse Practitioner

## 2019-08-03 VITALS — BP 110/77 | Temp 98.3°F | Resp 16 | Ht 66.0 in | Wt 245.0 lb

## 2019-08-03 DIAGNOSIS — E559 Vitamin D deficiency, unspecified: Secondary | ICD-10-CM | POA: Diagnosis not present

## 2019-08-03 DIAGNOSIS — R5383 Other fatigue: Secondary | ICD-10-CM

## 2019-08-03 DIAGNOSIS — M329 Systemic lupus erythematosus, unspecified: Secondary | ICD-10-CM | POA: Diagnosis not present

## 2019-08-03 DIAGNOSIS — J0191 Acute recurrent sinusitis, unspecified: Secondary | ICD-10-CM

## 2019-08-03 DIAGNOSIS — Q0700 Arnold-Chiari syndrome without spina bifida or hydrocephalus: Secondary | ICD-10-CM | POA: Diagnosis not present

## 2019-08-03 DIAGNOSIS — I7300 Raynaud's syndrome without gangrene: Secondary | ICD-10-CM

## 2019-08-03 DIAGNOSIS — E538 Deficiency of other specified B group vitamins: Secondary | ICD-10-CM

## 2019-08-03 DIAGNOSIS — J4520 Mild intermittent asthma, uncomplicated: Secondary | ICD-10-CM | POA: Diagnosis not present

## 2019-08-03 MED ORDER — SULFAMETHOXAZOLE-TRIMETHOPRIM 800-160 MG PO TABS: 1.0000 | ORAL_TABLET | Freq: Two times a day (BID) | ORAL | 0 refills | Status: DC

## 2019-08-03 MED ORDER — PREDNISONE 10 MG (21) PO TBPK: ORAL_TABLET | ORAL | 0 refills | Status: DC

## 2019-08-03 NOTE — Progress Notes (Signed)
[facility] [place], [place] [address]  Internal MEDICINE  Office Visit Note  Patient Name: Morgan Kent  [id number]  [id number]  Date of Service: 08/03/2019  Chief Complaint  Patient presents with  . Follow-up    weight management    The patient is here for follow up visit. She is complaining of sinus headache and generalized headaches. She has some congestion and phlegm in her throat. She was treated for sinusitis at her last visit. Symptoms got better but then came back as soon as she finished the antibiotics. In addition to this, she is having cold, painful feelings in her hands and feet. She does have lupus. She mentioned to her rheumatologist that this sensation was starting. She was told to continue with plaquenil and this should improve.  She has seen her neurosurgeon, Dr. Arnoldo Morale, due to headache and neck pain, along with the numbness and tingling in her fingers. They have ordered an MRI of her neck and head. She does have history of Chiari malformation. Has had two or three surgeries to correct this in the past.       Current Medication: Outpatient Encounter Medications as of 08/03/2019  Medication Sig Note  . adapalene (DIFFERIN) 0.1 % gel Apply 1 application topically at bedtime. Apply to forehead and chin   . albuterol (VENTOLIN HFA) 108 (90 Base) MCG/ACT inhaler Inhale 2 puffs into the lungs daily.   . Budesonide (PULMICORT FLEXHALER) 90 MCG/ACT inhaler Inhale 2 puffs into the lungs 2 (two) times daily.   . carvedilol (COREG) 25 MG tablet TAKE 1 TABLET BY MOUTH TWICE A DAY   . cetirizine (ZYRTEC) 10 MG tablet Take 1 tablet (10 mg total) by mouth daily.   . clobetasol (TEMOVATE) 0.05 % external solution Apply 1 application topically 2 (two) times daily as needed. Apply to scalp   . diclofenac sodium (VOLTAREN) 1 % GEL Apply 1 application topically 2 (two) times daily as needed (for pain).   Marland Kitchen ergocalciferol (DRISDOL) 1.25 MG (50000 UT) capsule  Take 1 capsule (50,000 Units total) by mouth once a week.   . fluticasone (FLONASE) 50 MCG/ACT nasal spray Place 1 spray into both nostrils at bedtime.   . gabapentin (NEURONTIN) 300 MG capsule Take 1 capsule (300 mg total) by mouth 3 (three) times daily.   . hydrochlorothiazide (HYDRODIURIL) 25 MG tablet TAKE 1 TABLET(25 MG) BY MOUTH DAILY   . hydroxychloroquine (PLAQUENIL) 200 MG tablet Take 200 mg by mouth daily.    Marland Kitchen KETOCONAZOLE, TOPICAL, 1 % SHAM Apply 1 application topically every other day.    . lisinopril (ZESTRIL) 5 MG tablet TAKE 1 TABLET BY MOUTH EVERY DAY   . meloxicam (MOBIC) 7.5 MG tablet Take 1 tablet (7.5 mg total) by mouth 2 (two) times daily.   . mometasone (ELOCON) 0.1 % ointment Apply 1 application topically 2 (two) times daily as needed (lupus outbreaks).   . norethindrone (MICRONOR,CAMILA,ERRIN) 0.35 MG tablet Take 1 tablet (0.35 mg total) by mouth daily.   Marland Kitchen oxyCODONE-acetaminophen (PERCOCET/ROXICET) 5-325 MG per tablet Take 1 tablet by mouth every 6 (six) hours as needed for moderate pain.    Vladimir Faster Glycol-Propyl Glycol (SYSTANE ULTRA) 0.4-0.3 % SOLN Place 1 drop into both eyes daily as needed (for dry eyes).    . tacrolimus (PROTOPIC) 0.1 % ointment Apply 1 application topically daily.   Marland Kitchen tiZANidine (ZANAFLEX) 4 MG tablet Take 4 mg by mouth every 8 (eight) hours as needed for muscle spasms.   Marland Kitchen  tretinoin (RETIN-A) 0.05 % cream Apply 1 application topically at bedtime.    . triamcinolone cream (KENALOG) 0.1 % Apply 1 application topically 2 (two) times daily as needed (outbreaks).    . [DISCONTINUED] phentermine (ADIPEX-P) 37.5 MG tablet Take 1 tablet (37.5 mg total) by mouth daily before breakfast. 08/03/2019: will hold off on this for now.   . montelukast (SINGULAIR) 10 MG tablet Take 1 tablet (10 mg total) by mouth at bedtime.   . predniSONE (STERAPRED UNI-PAK 21 TAB) 10 MG (21) TBPK tablet 6 day taper - take by mouth as directed for 6 days   .  sulfamethoxazole-trimethoprim (BACTRIM DS) 800-160 MG tablet Take 1 tablet by mouth 2 (two) times daily.   . [DISCONTINUED] azithromycin (ZITHROMAX) 250 MG tablet z-pack - take as directed for 5 days for sinus infection (Patient not taking: Reported on 08/03/2019)   . [DISCONTINUED] nystatin (MYCOSTATIN) 100000 UNIT/ML suspension Take 5 mLs (500,000 Units total) by mouth 4 (four) times daily. 10 days dose started on 02-25-17 (Patient not taking: Reported on 08/03/2019)   . [DISCONTINUED] predniSONE (STERAPRED UNI-PAK 21 TAB) 10 MG (21) TBPK tablet 6 day taper - take by mouth as directed for 6 days (Patient not taking: Reported on 08/03/2019)    No facility-administered encounter medications on file as of 08/03/2019.     Surgical History: Past Surgical History:  Procedure Laterality Date  . carpel tunnel  Left 2014  . CRANIOTOMY  14,06   chiari  . DENTAL SURGERY    . HERNIA REPAIR     umbilical  . PLACEMENT OF LUMBAR DRAIN N/A 07/07/2014   Procedure: PLACEMENT OF LUMBAR DRAIN AND CLOSURE OF CERVICAL INCISION;  Surgeon: Newman Pies, MD;  Location: [facility];  Service: Neurosurgery;  Laterality: N/A;  . SUBOCCIPITAL CRANIECTOMY CERVICAL LAMINECTOMY N/A 09/29/2013   Procedure: SUBOCCIPITAL CRANIECTOMY CERVICAL LAMINECTOMY/DURAPLASTY;  Surgeon: Ophelia Charter, MD;  Location: [facility];  Service: Neurosurgery;  Laterality: N/A;  posterior  . SUBOCCIPITAL CRANIECTOMY CERVICAL LAMINECTOMY N/A 06/23/2014   Procedure: SUBOCCIPITAL CRANIECTOMY CERVICAL LAMINECTOMY/DURAPLASTY;  Surgeon: Newman Pies, MD;  Location: [facility];  Service: Neurosurgery;  Laterality: N/A;  suboccipital craniectomy with cervical laminectomy and duraplasty    Medical History: Past Medical History:  Diagnosis Date  . Anxiety   . Arthritis    rheumo possible  . Chiari malformation   . Depression   . Dizziness   . Headache(784.0)   . History of kidney stones   . Hypertension   . Lupus ([place])   . Lupus  ([place])   . Nerve damage    NECK  . PONV (postoperative nausea and vomiting)    nausea only  . TMJ arthritis     Family History: Family History  Problem Relation Age of Onset  . Heart disease Father   . Hypertension Father   . Hypertension Mother   . Thyroid disease Mother   . Asthma Other   . Depression Other   . Diabetes Other   . Migraines Other   . Stroke Other     Social History   Socioeconomic History  . Marital status: Single    Spouse name: Not on file  . Number of children: Not on file  . Years of education: Not on file  . Highest education level: Not on file  Occupational History  . Not on file  Social Needs  . Financial resource strain: Not on file  . Food insecurity    Worry: Not on file  Inability: Not on file  . Transportation needs    Medical: Not on file    Non-medical: Not on file  Tobacco Use  . Smoking status: Never Smoker  . Smokeless tobacco: Never Used  Substance and Sexual Activity  . Alcohol use: No    Alcohol/week: 0.0 standard drinks  . Drug use: No  . Sexual activity: Not on file  Lifestyle  . Physical activity    Days per week: Not on file    Minutes per session: Not on file  . Stress: Not on file  Relationships  . Social Herbalist on phone: Not on file    Gets together: Not on file    Attends religious service: Not on file    Active member of club or organization: Not on file    Attends meetings of clubs or organizations: Not on file    Relationship status: Not on file  . Intimate partner violence    Fear of current or ex partner: Not on file    Emotionally abused: Not on file    Physically abused: Not on file    Forced sexual activity: Not on file  Other Topics Concern  . Not on file  Social History Narrative   Lives with 3 children in 2 story home.  Unemployed.  Trying to get disability.  Education: high school.      Review of Systems  Constitutional: Positive for fatigue. Negative for activity  change, chills and unexpected weight change.       No weight gain or weight loss since her last visit.   HENT: Positive for congestion, ear pain, postnasal drip, rhinorrhea, sinus pressure, sinus pain and sore throat. Negative for sneezing.   Respiratory: Negative for cough, chest tightness, shortness of breath and wheezing.   Cardiovascular: Negative for chest pain and palpitations.  Gastrointestinal: Negative for abdominal pain, constipation, diarrhea, nausea and vomiting.  Endocrine: Negative for cold intolerance, heat intolerance, polydipsia and polyuria.  Genitourinary: Negative for menstrual problem.  Musculoskeletal: Positive for back pain and myalgias. Negative for arthralgias, joint swelling and neck pain.       Sees pain management provider   Skin: Negative for rash.       Skin changes related to lupus.   Allergic/Immunologic: Positive for environmental allergies.  Neurological: Positive for weakness, numbness and headaches. Negative for tremors.       Has noted numbness and burning to the tips of her fingers. Has also noted blue discoloration and cold feeling to the fingertips.   Hematological: Positive for adenopathy. Does not bruise/bleed easily.  Psychiatric/Behavioral: Negative for behavioral problems (Depression), sleep disturbance and suicidal ideas. The patient is nervous/anxious.     Today's Vitals   08/03/19 0856  BP: 110/77  Resp: 16  Temp: 98.3 F (36.8 C)  SpO2: 99%  Weight: 245 lb (111.1 kg)  Height: 5\' 6"  (1.676 m)   Body mass index is 39.54 kg/m.  Physical Exam Vitals signs and nursing note reviewed.  Constitutional:      General: She is not in acute distress.    Appearance: Normal appearance. She is well-developed. She is obese. She is not diaphoretic.  HENT:     Head: Normocephalic and atraumatic.     Right Ear: External ear normal. Tenderness present. Tympanic membrane is erythematous.     Left Ear: External ear normal. Tenderness present. Tympanic  membrane is erythematous.     Nose:     Right Sinus: Frontal sinus tenderness  present.     Left Sinus: Frontal sinus tenderness present.     Mouth/Throat:     Pharynx: No oropharyngeal exudate.  Eyes:     Pupils: Pupils are equal, round, and reactive to light.  Neck:     Musculoskeletal: Normal range of motion and neck supple.     Thyroid: No thyromegaly.     Vascular: No JVD.     Trachea: No tracheal deviation.  Cardiovascular:     Rate and Rhythm: Normal rate and regular rhythm.     Heart sounds: Normal heart sounds. No murmur. No friction rub. No gallop.   Pulmonary:     Effort: Pulmonary effort is normal. No respiratory distress.     Breath sounds: Normal breath sounds. No wheezing or rales.  Chest:     Chest wall: No tenderness.  Abdominal:     Palpations: Abdomen is soft.  Lymphadenopathy:     Cervical: Cervical adenopathy present.  Skin:    General: Skin is warm and dry.  Neurological:     Mental Status: She is alert and oriented to person, place, and time. Mental status is at baseline.     Cranial Nerves: No cranial nerve deficit.  Psychiatric:        Behavior: Behavior normal.        Thought Content: Thought content normal.        Judgment: Judgment normal.    Assessment/Plan:  1. Acute recurrent sinusitis, unspecified location Treat with bactrim Ds bid for 10 days. Add prednisone taper. Take as directed for 6 days . - predniSONE (STERAPRED UNI-PAK 21 TAB) 10 MG (21) TBPK tablet; 6 day taper - take by mouth as directed for 6 days  Dispense: 21 tablet; Refill: 0 - sulfamethoxazole-trimethoprim (BACTRIM DS) 800-160 MG tablet; Take 1 tablet by mouth 2 (two) times daily.  Dispense: 20 tablet; Refill: 0  2. Other fatigue Check labs including full anemia and thyroid panels.   3. Vitamin B12 deficiency Check anemia pane.   4. Vitamin D deficiency Check vitamin d level   5. Mild intermittent asthma without complication Continue to use inhalers as prescribed   6.  Systemic lupus erythematosus, unspecified SLE type, unspecified organ involvement status ([place]) Follow up with rheumatologist and dermatologist as scheduled and as needed   7. Raynaud's disease without gangrene Likely related to SLE. Recommended use of pocket hand and foot warmers for temporary relief. Advised she discuss with rheumatologist as scheduled   8. Arnold-Chiari syndrome ([place]) MRI scheduled for 08/13/2019. Follow up with Dr. Arnoldo Morale as scheduled,  General Counseling: Cledith verbalizes understanding of the findings of todays visit and agrees with plan of treatment. I have discussed any further diagnostic evaluation that may be needed or ordered today. We also reviewed her medications today. she has been encouraged to call the office with any questions or concerns that should arise related to todays visit.    This patient was seen by Darke with Dr Lavera Guise as a part of collaborative care agreement  Meds ordered this encounter  Medications  . predniSONE (STERAPRED UNI-PAK 21 TAB) 10 MG (21) TBPK tablet    Sig: 6 day taper - take by mouth as directed for 6 days    Dispense:  21 tablet    Refill:  0    Order Specific Question:   Supervising Provider    Answer:   Lavera Guise [place]  . sulfamethoxazole-trimethoprim (BACTRIM DS) 800-160 MG tablet    Sig:  Take 1 tablet by mouth 2 (two) times daily.    Dispense:  20 tablet    Refill:  0    Order Specific Question:   Supervising Provider    Answer:   Lavera Guise [id number]    Time spent: 55 Minutes      Dr Lavera Guise Internal medicine

## 2019-08-04 ENCOUNTER — Ambulatory Visit: Payer: Medicare Other

## 2019-08-13 ENCOUNTER — Ambulatory Visit
Admission: RE | Admit: 2019-08-13 | Discharge: 2019-08-13 | Disposition: A | Discharge status: Home / Self Care | Payer: Medicare Other | Source: Ambulatory Visit | Attending: Neurosurgery | Admitting: Neurosurgery

## 2019-08-13 ENCOUNTER — Other Ambulatory Visit: Payer: Self-pay

## 2019-08-13 DIAGNOSIS — G935 Compression of brain: Secondary | ICD-10-CM | POA: Diagnosis not present

## 2019-08-13 DIAGNOSIS — M4802 Spinal stenosis, cervical region: Secondary | ICD-10-CM | POA: Diagnosis not present

## 2019-09-27 ENCOUNTER — Other Ambulatory Visit: Payer: Self-pay | Admitting: Internal Medicine

## 2019-10-04 ENCOUNTER — Telehealth: Payer: Self-pay

## 2019-10-04 NOTE — Telephone Encounter (Signed)
PATIENT WAS SUPPOSED TO HAVE THIS FOLLOW UP APPOINTMENT TO DISCUSS LAB RESULTS. AT THE TIME OF CONFIRMING PATIENT STATED THEY MISPLACED THEIR LAB SHEET SO SHE WAS UNABLE TO GET THE LABS DONE SHE ALSO STATED WHILE SCREENING FOR COVID SHE WAS DISPLAYING SOME UPPER RESP SYMPTOMS SO MADE THE 10-06-19  VISIT AN ACUTE VIRTUAL.

## 2019-10-06 ENCOUNTER — Other Ambulatory Visit: Payer: Self-pay

## 2019-10-06 ENCOUNTER — Ambulatory Visit (INDEPENDENT_AMBULATORY_CARE_PROVIDER_SITE_OTHER): Payer: Medicare Other | Admitting: Adult Health

## 2019-10-06 ENCOUNTER — Encounter: Payer: Self-pay | Admitting: Adult Health

## 2019-10-06 DIAGNOSIS — J0191 Acute recurrent sinusitis, unspecified: Secondary | ICD-10-CM

## 2019-10-06 MED ORDER — SULFAMETHOXAZOLE-TRIMETHOPRIM 800-160 MG PO TABS: 1.0000 | ORAL_TABLET | Freq: Two times a day (BID) | ORAL | 0 refills | Status: DC

## 2019-10-06 NOTE — Progress Notes (Signed)
[facility] [place], [place] [address]  Internal MEDICINE  Telephone Visit  Patient Name: Morgan Kent  [id number]  [id number]  Date of Service: 10/17/2019  I connected with the patient at 840 by telephone and verified the patients identity using two identifiers.   I discussed the limitations, risks, security and privacy concerns of performing an evaluation and management service by telephone and the availability of in person appointments. I also discussed with the patient that there may be a patient responsible charge related to the service.  The patient expressed understanding and agrees to proceed.    Chief Complaint  Patient presents with  . Telephone Assessment  . Telephone Screen  . Sinusitis  . Medication Refill    plaquenil    HPI  Pt is seen via telephone.  She reports about 5 days of sore throat, and now has sinus pressure. She denies any fever or sob.  She is concerned because she has had recurrent infections in the past and would like to take some antibiotics before this gets worse.    Current Medication: Outpatient Encounter Medications as of 10/06/2019  Medication Sig  . adapalene (DIFFERIN) 0.1 % gel Apply 1 application topically at bedtime. Apply to forehead and chin  . albuterol (VENTOLIN HFA) 108 (90 Base) MCG/ACT inhaler Inhale 2 puffs into the lungs daily.  . Budesonide (PULMICORT FLEXHALER) 90 MCG/ACT inhaler Inhale 2 puffs into the lungs 2 (two) times daily.  . carvedilol (COREG) 25 MG tablet TAKE 1 TABLET BY MOUTH TWICE A DAY  . cetirizine (ZYRTEC) 10 MG tablet Take 1 tablet (10 mg total) by mouth daily.  . clobetasol (TEMOVATE) 0.05 % external solution Apply 1 application topically 2 (two) times daily as needed. Apply to scalp  . diclofenac sodium (VOLTAREN) 1 % GEL Apply 1 application topically 2 (two) times daily as needed (for pain).  Marland Kitchen ergocalciferol (DRISDOL) 1.25 MG (50000 UT) capsule Take 1 capsule (50,000 Units total) by  mouth once a week.  . fluticasone (FLONASE) 50 MCG/ACT nasal spray Place 1 spray into both nostrils at bedtime.  . gabapentin (NEURONTIN) 300 MG capsule Take 1 capsule (300 mg total) by mouth 3 (three) times daily.  . hydrochlorothiazide (HYDRODIURIL) 25 MG tablet TAKE 1 TABLET(25 MG) BY MOUTH DAILY  . hydroxychloroquine (PLAQUENIL) 200 MG tablet Take 200 mg by mouth daily.   Marland Kitchen KETOCONAZOLE, TOPICAL, 1 % SHAM Apply 1 application topically every other day.   . lisinopril (ZESTRIL) 5 MG tablet TAKE 1 TABLET BY MOUTH EVERY DAY  . meloxicam (MOBIC) 7.5 MG tablet Take 1 tablet (7.5 mg total) by mouth 2 (two) times daily.  . mometasone (ELOCON) 0.1 % ointment Apply 1 application topically 2 (two) times daily as needed (lupus outbreaks).  . montelukast (SINGULAIR) 10 MG tablet Take 1 tablet (10 mg total) by mouth at bedtime.  Marland Kitchen oxyCODONE-acetaminophen (PERCOCET/ROXICET) 5-325 MG per tablet Take 1 tablet by mouth every 6 (six) hours as needed for moderate pain.   Vladimir Faster Glycol-Propyl Glycol (SYSTANE ULTRA) 0.4-0.3 % SOLN Place 1 drop into both eyes daily as needed (for dry eyes).   . predniSONE (STERAPRED UNI-PAK 21 TAB) 10 MG (21) TBPK tablet 6 day taper - take by mouth as directed for 6 days  . sulfamethoxazole-trimethoprim (BACTRIM DS) 800-160 MG tablet Take 1 tablet by mouth 2 (two) times daily.  . tacrolimus (PROTOPIC) 0.1 % ointment Apply 1 application topically daily.  Marland Kitchen tiZANidine (ZANAFLEX) 4 MG tablet Take 4  mg by mouth every 8 (eight) hours as needed for muscle spasms.  Marland Kitchen tretinoin (RETIN-A) 0.05 % cream Apply 1 application topically at bedtime.   . triamcinolone cream (KENALOG) 0.1 % Apply 1 application topically 2 (two) times daily as needed (outbreaks).   . [DISCONTINUED] norethindrone (MICRONOR,CAMILA,ERRIN) 0.35 MG tablet Take 1 tablet (0.35 mg total) by mouth daily.  . [DISCONTINUED] sulfamethoxazole-trimethoprim (BACTRIM DS) 800-160 MG tablet Take 1 tablet by mouth 2 (two) times  daily.   No facility-administered encounter medications on file as of 10/06/2019.     Surgical History: Past Surgical History:  Procedure Laterality Date  . carpel tunnel  Left 2014  . CRANIOTOMY  14,06   chiari  . DENTAL SURGERY    . HERNIA REPAIR     umbilical  . PLACEMENT OF LUMBAR DRAIN N/A 07/07/2014   Procedure: PLACEMENT OF LUMBAR DRAIN AND CLOSURE OF CERVICAL INCISION;  Surgeon: Newman Pies, MD;  Location: [facility];  Service: Neurosurgery;  Laterality: N/A;  . SUBOCCIPITAL CRANIECTOMY CERVICAL LAMINECTOMY N/A 09/29/2013   Procedure: SUBOCCIPITAL CRANIECTOMY CERVICAL LAMINECTOMY/DURAPLASTY;  Surgeon: Ophelia Charter, MD;  Location: [facility];  Service: Neurosurgery;  Laterality: N/A;  posterior  . SUBOCCIPITAL CRANIECTOMY CERVICAL LAMINECTOMY N/A 06/23/2014   Procedure: SUBOCCIPITAL CRANIECTOMY CERVICAL LAMINECTOMY/DURAPLASTY;  Surgeon: Newman Pies, MD;  Location: [facility];  Service: Neurosurgery;  Laterality: N/A;  suboccipital craniectomy with cervical laminectomy and duraplasty    Medical History: Past Medical History:  Diagnosis Date  . Anxiety   . Arthritis    rheumo possible  . Chiari malformation   . Depression   . Dizziness   . Headache(784.0)   . History of kidney stones   . Hypertension   . Lupus ([place])   . Lupus ([place])   . Nerve damage    NECK  . PONV (postoperative nausea and vomiting)    nausea only  . TMJ arthritis     Family History: Family History  Problem Relation Age of Onset  . Heart disease Father   . Hypertension Father   . Hypertension Mother   . Thyroid disease Mother   . Asthma Other   . Depression Other   . Diabetes Other   . Migraines Other   . Stroke Other     Social History   Socioeconomic History  . Marital status: Single    Spouse name: Not on file  . Number of children: Not on file  . Years of education: Not on file  . Highest education level: Not on file  Occupational History  . Not on file   Social Needs  . Financial resource strain: Not on file  . Food insecurity    Worry: Not on file    Inability: Not on file  . Transportation needs    Medical: Not on file    Non-medical: Not on file  Tobacco Use  . Smoking status: Never Smoker  . Smokeless tobacco: Never Used  Substance and Sexual Activity  . Alcohol use: No    Alcohol/week: 0.0 standard drinks  . Drug use: No  . Sexual activity: Not on file  Lifestyle  . Physical activity    Days per week: Not on file    Minutes per session: Not on file  . Stress: Not on file  Relationships  . Social Herbalist on phone: Not on file    Gets together: Not on file    Attends religious service: Not on file  Active member of club or organization: Not on file    Attends meetings of clubs or organizations: Not on file    Relationship status: Not on file  . Intimate partner violence    Fear of current or ex partner: Not on file    Emotionally abused: Not on file    Physically abused: Not on file    Forced sexual activity: Not on file  Other Topics Concern  . Not on file  Social History Narrative   Lives with 3 children in 2 story home.  Unemployed.  Trying to get disability.  Education: high school.      Review of Systems  Constitutional: Negative for chills, fatigue and unexpected weight change.  HENT: Positive for postnasal drip, rhinorrhea, sinus pressure and sinus pain. Negative for congestion, sneezing and sore throat.   Eyes: Negative for photophobia, pain and redness.  Respiratory: Negative for cough, chest tightness and shortness of breath.   Cardiovascular: Negative for chest pain and palpitations.  Gastrointestinal: Negative for abdominal pain, constipation, diarrhea, nausea and vomiting.  Endocrine: Negative.   Genitourinary: Negative for dysuria and frequency.  Musculoskeletal: Negative for arthralgias, back pain, joint swelling and neck pain.  Skin: Negative for rash.  Allergic/Immunologic:  Negative.   Neurological: Negative for tremors and numbness.  Hematological: Negative for adenopathy. Does not bruise/bleed easily.  Psychiatric/Behavioral: Negative for behavioral problems and sleep disturbance. The patient is not nervous/anxious.     Vital Signs: Temp (!) 97.1 F (36.2 C)    Observation/Objective: Well appearing, nad noted    Assessment/Plan: 1. Acute recurrent sinusitis, unspecified location Continue to use flonase, and inhalers as directed.  Advised patient to take entire course of antibiotics as prescribed with food. Pt should return to clinic in 7-10 days if symptoms fail to improve or new symptoms develop.  - sulfamethoxazole-trimethoprim (BACTRIM DS) 800-160 MG tablet; Take 1 tablet by mouth 2 (two) times daily.  Dispense: 20 tablet; Refill: 0  General Counseling: Elsey verbalizes understanding of the findings of today's phone visit and agrees with plan of treatment. I have discussed any further diagnostic evaluation that may be needed or ordered today. We also reviewed her medications today. she has been encouraged to call the office with any questions or concerns that should arise related to todays visit.    No orders of the defined types were placed in this encounter.   Meds ordered this encounter  Medications  . sulfamethoxazole-trimethoprim (BACTRIM DS) 800-160 MG tablet    Sig: Take 1 tablet by mouth 2 (two) times daily.    Dispense:  20 tablet    Refill:  0    Time spent: [place] AGNP-C Internal medicine

## 2019-10-14 ENCOUNTER — Other Ambulatory Visit: Payer: Self-pay

## 2019-10-14 DIAGNOSIS — L930 Discoid lupus erythematosus: Secondary | ICD-10-CM | POA: Diagnosis not present

## 2019-10-14 MED ORDER — NORETHINDRONE 0.35 MG PO TABS: 1.0000 | ORAL_TABLET | Freq: Every day | ORAL | 3 refills | Status: DC

## 2019-10-19 DIAGNOSIS — Q0700 Arnold-Chiari syndrome without spina bifida or hydrocephalus: Secondary | ICD-10-CM | POA: Diagnosis not present

## 2019-10-19 DIAGNOSIS — L930 Discoid lupus erythematosus: Secondary | ICD-10-CM | POA: Diagnosis not present

## 2019-10-29 ENCOUNTER — Telehealth: Payer: Self-pay

## 2019-10-29 ENCOUNTER — Other Ambulatory Visit: Payer: Self-pay | Admitting: Nurse Practitioner

## 2019-10-29 DIAGNOSIS — Z0001 Encounter for general adult medical examination with abnormal findings: Secondary | ICD-10-CM | POA: Diagnosis not present

## 2019-10-29 DIAGNOSIS — E538 Deficiency of other specified B group vitamins: Secondary | ICD-10-CM | POA: Diagnosis not present

## 2019-10-29 DIAGNOSIS — D509 Iron deficiency anemia, unspecified: Secondary | ICD-10-CM | POA: Diagnosis not present

## 2019-10-29 DIAGNOSIS — E559 Vitamin D deficiency, unspecified: Secondary | ICD-10-CM | POA: Diagnosis not present

## 2019-10-29 DIAGNOSIS — E039 Hypothyroidism, unspecified: Secondary | ICD-10-CM | POA: Diagnosis not present

## 2019-10-29 DIAGNOSIS — R7301 Impaired fasting glucose: Secondary | ICD-10-CM | POA: Diagnosis not present

## 2019-10-29 NOTE — Telephone Encounter (Signed)
CONFIRMED AND SCREENED FOR 11-02-19 OV. 

## 2019-10-30 LAB — COMPREHENSIVE METABOLIC PANEL
ALT: 7 IU/L (ref 0–32)
AST: 13 IU/L (ref 0–40)
Albumin/Globulin Ratio: 1.3 (ref 1.2–2.2)
Albumin: 4 g/dL (ref 3.8–4.8)
Alkaline Phosphatase: 58 IU/L (ref 39–117)
BUN/Creatinine Ratio: 10 (ref 9–23)
BUN: 13 mg/dL (ref 6–20)
Bilirubin Total: 0.2 mg/dL (ref 0.0–1.2)
CO2: 21 mmol/L (ref 20–29)
Calcium: 9.4 mg/dL (ref 8.7–10.2)
Chloride: 100 mmol/L (ref 96–106)
Creatinine, Ser: 1.33 mg/dL — ABNORMAL HIGH (ref 0.57–1.00)
GFR calc Af Amer: 58 mL/min/{1.73_m2} — ABNORMAL LOW (ref >59)
GFR calc non Af Amer: 50 mL/min/{1.73_m2} — ABNORMAL LOW (ref >59)
Globulin, Total: 3.2 g/dL (ref 1.5–4.5)
Glucose: 97 mg/dL (ref 65–99)
Potassium: 4.7 mmol/L (ref 3.5–5.2)
Sodium: 135 mmol/L (ref 134–144)
Total Protein: 7.2 g/dL (ref 6.0–8.5)

## 2019-10-30 LAB — LIPID PANEL W/O CHOL/HDL RATIO
Cholesterol, Total: 195 mg/dL (ref 100–199)
HDL: 34 mg/dL — ABNORMAL LOW (ref >39)
LDL Chol Calc (NIH): 131 mg/dL — ABNORMAL HIGH (ref 0–99)
Triglycerides: 164 mg/dL — ABNORMAL HIGH (ref 0–149)
VLDL Cholesterol Cal: 30 mg/dL (ref 5–40)

## 2019-10-30 LAB — CBC
Hematocrit: 29.3 % — ABNORMAL LOW (ref 34.0–46.6)
Hemoglobin: 9.8 g/dL — ABNORMAL LOW (ref 11.1–15.9)
MCH: 29.3 pg (ref 26.6–33.0)
MCHC: 33.4 g/dL (ref 31.5–35.7)
MCV: 88 fL (ref 79–97)
Platelets: 179 10*3/uL (ref 150–450)
RBC: 3.35 x10E6/uL — ABNORMAL LOW (ref 3.77–5.28)
RDW: 13.7 % (ref 11.7–15.4)
WBC: 3.9 10*3/uL (ref 3.4–10.8)

## 2019-10-30 LAB — VITAMIN D 25 HYDROXY (VIT D DEFICIENCY, FRACTURES): Vit D, 25-Hydroxy: 17.7 ng/mL — ABNORMAL LOW (ref 30.0–100.0)

## 2019-10-30 LAB — IRON AND TIBC
Iron Saturation: 19 % (ref 15–55)
Iron: 51 ug/dL (ref 27–159)
Total Iron Binding Capacity: 269 ug/dL (ref 250–450)
UIBC: 218 ug/dL (ref 131–425)

## 2019-10-30 LAB — HGB A1C W/O EAG: Hgb A1c MFr Bld: 6.1 % — ABNORMAL HIGH (ref 4.8–5.6)

## 2019-10-30 LAB — TSH: TSH: 0.372 u[IU]/mL — ABNORMAL LOW (ref 0.450–4.500)

## 2019-10-30 LAB — B12 AND FOLATE PANEL
Folate: 12.9 ng/mL (ref >3.0)
Vitamin B-12: 272 pg/mL (ref 232–1245)

## 2019-10-30 LAB — FERRITIN: Ferritin: 83 ng/mL (ref 15–150)

## 2019-11-01 ENCOUNTER — Other Ambulatory Visit: Payer: Self-pay

## 2019-11-01 DIAGNOSIS — J4520 Mild intermittent asthma, uncomplicated: Secondary | ICD-10-CM

## 2019-11-01 MED ORDER — PULMICORT FLEXHALER 90 MCG/ACT IN AEPB: 2.0000 | INHALATION_SPRAY | Freq: Two times a day (BID) | RESPIRATORY_TRACT | 5 refills | Status: DC

## 2019-11-01 NOTE — Progress Notes (Signed)
Cardiology Office Note  Date:  11/02/2019   ID:  James, Burrow [birth year], MRN [id number]  PCP:  Ronnell Freshwater, NP   Chief Complaint  Patient presents with  . other    6 month f/u no complaints today. Meds reviewed verbally with pt.   HPI:  Morgan Kent is a pleasant 38 year old woman with  morbid obesity, lupus, dx in 2013 neck surgery With chronic pain down her arms bilaterally,  sleep apnea on nasal CPAP,   EF 60 to 65% in 11/2015, up from 45% in 2016 who presents for follow-up of her symptoms of shortness of breath, cardiomyopathy  Lupus flare up, skin "bad this year" Tips of fingers tingling, sometimes "blue" Happens when she wakes up, Not in the cold, Seen by rheumatology Felt she might haveRaynaud's Phenomenon: Suggested amlodipine  Has been told she has neuropathy  meds reviewed On coreg, lisinopril, HCTZ BP stable No edema,  Chronic SOB on exertion, weight high, no regular exercise  Periodic sinus infection  "pain all year round, on and off" Neck, arms, legs Hurts to walk Followed by the pain clinic in [place]  Denies any shortness breath or chest pain No leg swelling, no PND or orthopnea  HBA1C 6.1 TOTAL CHOL 195 Cr 1.33 Vit D 17 TSH 0.3  EKG personally reviewed by myself on todays visit Shows normal sinus rhythm rate 93 bpm no significant ST or T wave changes  PMH:   has a past medical history of Anxiety, Arthritis, Chiari malformation, Depression, Dizziness, Headache(784.0), History of kidney stones, Hypertension, Lupus ([place]), Lupus ([place]), Nerve damage, PONV (postoperative nausea and vomiting), and TMJ arthritis.  PSH:    Past Surgical History:  Procedure Laterality Date  . carpel tunnel  Left 2014  . CRANIOTOMY  14,06   chiari  . DENTAL SURGERY    . HERNIA REPAIR     umbilical  . PLACEMENT OF LUMBAR DRAIN N/A 07/07/2014   Procedure: PLACEMENT OF LUMBAR DRAIN AND CLOSURE OF CERVICAL INCISION;  Surgeon: Newman Pies, MD;   Location: [facility];  Service: Neurosurgery;  Laterality: N/A;  . SUBOCCIPITAL CRANIECTOMY CERVICAL LAMINECTOMY N/A 09/29/2013   Procedure: SUBOCCIPITAL CRANIECTOMY CERVICAL LAMINECTOMY/DURAPLASTY;  Surgeon: Ophelia Charter, MD;  Location: [facility];  Service: Neurosurgery;  Laterality: N/A;  posterior  . SUBOCCIPITAL CRANIECTOMY CERVICAL LAMINECTOMY N/A 06/23/2014   Procedure: SUBOCCIPITAL CRANIECTOMY CERVICAL LAMINECTOMY/DURAPLASTY;  Surgeon: Newman Pies, MD;  Location: [facility];  Service: Neurosurgery;  Laterality: N/A;  suboccipital craniectomy with cervical laminectomy and duraplasty    Current Outpatient Medications  Medication Sig Dispense Refill  . albuterol (VENTOLIN HFA) 108 (90 Base) MCG/ACT inhaler Inhale 2 puffs into the lungs daily. 18 g 5  . carvedilol (COREG) 25 MG tablet TAKE 1 TABLET BY MOUTH TWICE A DAY 180 tablet 3  . cetirizine (ZYRTEC) 10 MG tablet Take 1 tablet (10 mg total) by mouth daily. 30 tablet 5  . cyclobenzaprine (FLEXERIL) 10 MG tablet Take 10 mg by mouth 3 (three) times daily as needed.    . doxycycline (VIBRA-TABS) 100 MG tablet Take 100 mg by mouth 2 (two) times daily.    . hydrochlorothiazide (HYDRODIURIL) 12.5 MG tablet Take 1 tablet (12.5 mg total) by mouth daily. 90 tablet 3  . hydroxychloroquine (PLAQUENIL) 200 MG tablet Take 200 mg by mouth daily.     Marland Kitchen lisinopril (ZESTRIL) 5 MG tablet TAKE 1 TABLET BY MOUTH EVERY DAY 90 tablet 3  . montelukast (SINGULAIR) 10 MG tablet Take  1 tablet (10 mg total) by mouth at bedtime. 90 tablet 3  . mupirocin ointment (BACTROBAN) 2 % as needed.    . norethindrone (MICRONOR) 0.35 MG tablet Take 1 tablet (0.35 mg total) by mouth daily. 3 Package 3  . oxyCODONE-acetaminophen (PERCOCET) 7.5-325 MG tablet Take 1 tablet by mouth every 4 (four) hours as needed for severe pain.    Vladimir Faster Glycol-Propyl Glycol (SYSTANE ULTRA) 0.4-0.3 % SOLN Place 1 drop into both eyes daily as needed (for dry eyes).     .  triamcinolone cream (KENALOG) 0.1 % Apply 1 application topically 2 (two) times daily as needed (outbreaks).   0  . adapalene (DIFFERIN) 0.1 % gel Apply 1 application topically at bedtime. Apply to forehead and chin    . Budesonide (PULMICORT FLEXHALER) 90 MCG/ACT inhaler Inhale 2 puffs into the lungs 2 (two) times daily. 3 each 1  . clobetasol (TEMOVATE) 0.05 % external solution Apply 1 application topically 2 (two) times daily as needed. Apply to scalp    . diclofenac sodium (VOLTAREN) 1 % GEL Apply 1 application topically 2 (two) times daily as needed (for pain). (Patient not taking: Reported on 11/02/2019) 300 g 3  . ergocalciferol (DRISDOL) 1.25 MG (50000 UT) capsule Take 1 capsule (50,000 Units total) by mouth once a week. 4 capsule 5  . ferrous sulfate (FERROUSUL) 325 (65 FE) MG tablet Take 1 tablet (325 mg total) by mouth daily with breakfast. 90 tablet 1  . fluticasone (FLONASE) 50 MCG/ACT nasal spray Place 1 spray into both nostrils at bedtime. 48 g 3  . gabapentin (NEURONTIN) 300 MG capsule Take 1 capsule (300 mg total) by mouth 3 (three) times daily. 270 capsule 1  . KETOCONAZOLE, TOPICAL, 1 % SHAM Apply 1 application topically every other day.     . meloxicam (MOBIC) 7.5 MG tablet Take 1 tablet (7.5 mg total) by mouth 2 (two) times daily. 180 tablet 1  . mometasone (ELOCON) 0.1 % ointment Apply 1 application topically 2 (two) times daily as needed (lupus outbreaks).    . tacrolimus (PROTOPIC) 0.1 % ointment Apply 1 application topically daily.    Marland Kitchen tretinoin (RETIN-A) 0.05 % cream Apply 1 application topically at bedtime.   0   No current facility-administered medications for this visit.     Allergies:   Other and Cephalexin   Social History:  The patient  reports that she has never smoked. She has never used smokeless tobacco. She reports that she does not drink alcohol or use drugs.   Family History:   family history includes Asthma in an other family member; Depression in an  other family member; Diabetes in an other family member; Heart disease in her father; Hypertension in her father and mother; Migraines in an other family member; Stroke in an other family member; Thyroid disease in her mother.    Review of Systems: Review of Systems  Constitutional: Negative.   HENT: Negative.   Respiratory: Negative.   Cardiovascular: Negative.   Gastrointestinal: Negative.   Musculoskeletal: Negative.        Chronic arm, neck, leg pain, finger tips sore  Neurological: Negative.   Psychiatric/Behavioral: Negative.   All other systems reviewed and are negative.   PHYSICAL EXAM: VS:  BP 118/80 (BP Location: Left Arm, Patient Position: Sitting, Cuff Size: Large)   Pulse 93   Ht 5\' 6"  (1.676 m)   Wt 235 lb 8 oz (106.8 kg)   SpO2 96%   BMI 38.01 kg/m  ,  BMI Body mass index is 38.01 kg/m.  Constitutional:  oriented to person, place, and time. No distress.  HENT:  Head: Grossly normal Eyes:  no discharge. No scleral icterus.  Neck: No JVD, no carotid bruits  Cardiovascular: Regular rate and rhythm, no murmurs appreciated Pulmonary/Chest: Clear to auscultation bilaterally, no wheezes or rails Abdominal: Soft.  no distension.  no tenderness.  Musculoskeletal: Normal range of motion Neurological:  normal muscle tone. Coordination normal. No atrophy Skin: Skin warm and dry, fingertips pink, good capillary refill Psychiatric: normal affect, pleasant   Recent Labs: 10/29/2019: ALT 7; BUN 13; Creatinine, Ser 1.33; Hemoglobin 9.8; Platelets 179; Potassium 4.7; Sodium 135; TSH 0.372    Lipid Panel Lab Results  Component Value Date   CHOL 195 10/29/2019   HDL 34 (L) 10/29/2019   LDLCALC 131 (H) 10/29/2019   TRIG 164 (H) 10/29/2019      Wt Readings from Last 3 Encounters:  11/02/19 238 lb 6.4 oz (108.1 kg)  11/02/19 235 lb 8 oz (106.8 kg)  08/03/19 245 lb (111.1 kg)      ASSESSMENT AND PLAN:   Morbid obesity ([place]) She reports unable to exercise  secondary to chronic pain We have encouraged continued exercise, careful diet management in an effort to lose weight.  Essential hypertension BP low, CR 1.33 Will cut the hydrochlorothiazide to 12.5 daily given climbing creatinine She does not want to add amlodipine at this time  Obstructive sleep apnea on CPAP PMD did a test, "does not need it" Not on it  Shortness of breath Deconditioned, weight is elevated Stable symptoms Needs exercise  Tachycardia Stable on beta-blocker, no changes to her medications stable  Chronic pain, arms Chronic neck, shoulder, back, arm pain  on Neurontin,   Chronic issue, seen in the pain clinic Also with tingling nerve pain arms legs Also finger tips, She does not want amlodipine right now.   Total encounter time more than 25 minutes  Greater than 50% was spent in counseling and coordination of care with the patient   Disposition:   F/U 12 months   Orders Placed This Encounter  Procedures  . EKG 12-Lead     Signed, Esmond Plants, M.D., Ph.D. 11/02/2019  [place], [place]

## 2019-11-02 ENCOUNTER — Ambulatory Visit (INDEPENDENT_AMBULATORY_CARE_PROVIDER_SITE_OTHER): Payer: Medicare Other | Admitting: Cardiovascular Disease

## 2019-11-02 ENCOUNTER — Encounter: Payer: Self-pay | Admitting: Cardiovascular Disease

## 2019-11-02 ENCOUNTER — Telehealth: Payer: Self-pay

## 2019-11-02 ENCOUNTER — Other Ambulatory Visit: Payer: Self-pay

## 2019-11-02 ENCOUNTER — Ambulatory Visit (INDEPENDENT_AMBULATORY_CARE_PROVIDER_SITE_OTHER): Payer: Medicare Other | Admitting: Nurse Practitioner

## 2019-11-02 VITALS — BP 118/80 | HR 93 | Ht 66.0 in | Wt 235.5 lb

## 2019-11-02 VITALS — BP 130/88 | HR 101 | Temp 97.3°F | Resp 18 | Ht 66.0 in | Wt 238.4 lb

## 2019-11-02 DIAGNOSIS — J309 Allergic rhinitis, unspecified: Secondary | ICD-10-CM | POA: Diagnosis not present

## 2019-11-02 DIAGNOSIS — J4520 Mild intermittent asthma, uncomplicated: Secondary | ICD-10-CM | POA: Diagnosis not present

## 2019-11-02 DIAGNOSIS — G4733 Obstructive sleep apnea (adult) (pediatric): Secondary | ICD-10-CM | POA: Diagnosis not present

## 2019-11-02 DIAGNOSIS — E559 Vitamin D deficiency, unspecified: Secondary | ICD-10-CM

## 2019-11-02 DIAGNOSIS — D509 Iron deficiency anemia, unspecified: Secondary | ICD-10-CM

## 2019-11-02 DIAGNOSIS — M792 Neuralgia and neuritis, unspecified: Secondary | ICD-10-CM

## 2019-11-02 DIAGNOSIS — I420 Dilated cardiomyopathy: Secondary | ICD-10-CM

## 2019-11-02 DIAGNOSIS — I5022 Chronic systolic (congestive) heart failure: Secondary | ICD-10-CM

## 2019-11-02 DIAGNOSIS — M064 Inflammatory polyarthropathy: Secondary | ICD-10-CM

## 2019-11-02 DIAGNOSIS — N920 Excessive and frequent menstruation with regular cycle: Secondary | ICD-10-CM

## 2019-11-02 DIAGNOSIS — L930 Discoid lupus erythematosus: Secondary | ICD-10-CM

## 2019-11-02 MED ORDER — HYDROCHLOROTHIAZIDE 12.5 MG PO TABS: 12.5000 mg | ORAL_TABLET | Freq: Every day | ORAL | 3 refills | Status: DC

## 2019-11-02 MED ORDER — PULMICORT FLEXHALER 90 MCG/ACT IN AEPB: 2.0000 | INHALATION_SPRAY | Freq: Two times a day (BID) | RESPIRATORY_TRACT | 1 refills | Status: DC

## 2019-11-02 MED ORDER — MELOXICAM 7.5 MG PO TABS: 7.5000 mg | ORAL_TABLET | Freq: Two times a day (BID) | ORAL | 1 refills | Status: DC

## 2019-11-02 MED ORDER — FERROUS SULFATE 325 (65 FE) MG PO TABS: 325.0000 mg | ORAL_TABLET | Freq: Every day | ORAL | 1 refills | Status: DC

## 2019-11-02 MED ORDER — ERGOCALCIFEROL 1.25 MG (50000 UT) PO CAPS: 50000.0000 [IU] | ORAL_CAPSULE | ORAL | 5 refills | Status: DC

## 2019-11-02 MED ORDER — FLUTICASONE PROPIONATE 50 MCG/ACT NA SUSP: 1.0000 | Freq: Every day | NASAL | 3 refills | Status: DC

## 2019-11-02 MED ORDER — GABAPENTIN 300 MG PO CAPS: 300.0000 mg | ORAL_CAPSULE | Freq: Three times a day (TID) | ORAL | 1 refills | Status: DC

## 2019-11-02 NOTE — Progress Notes (Signed)
Patient anemic, vitamin d deficienct. Elevated renal functions. Discuss at visit 12/22

## 2019-11-02 NOTE — Patient Instructions (Addendum)
Medication Instructions:  - Your physician has recommended you make the following change in your medication:   1) Decrease hydrochlorothiazide to 12.5 mg- take 1 tablet by mouth once daily  - Drink little bit more water  If you need a refill on your cardiac medications before your next appointment, please call your pharmacy.    Lab work: No new labs needed   If you have labs (blood work) drawn today and your tests are completely normal, you will receive your results only by: Marland Kitchen MyChart Message (if you have MyChart) OR . A paper copy in the mail If you have any lab test that is abnormal or we need to change your treatment, we will call you to review the results.   Testing/Procedures: No new testing needed   Follow-Up: At [facility], you and your health needs are our priority.  As part of our continuing mission to provide you with exceptional heart care, we have created designated Provider Care Teams.  These Care Teams include your primary Cardiologist (physician) and Advanced Practice Providers (APPs -  Physician Assistants and Nurse Practitioners) who all work together to provide you with the care you need, when you need it.  . You will need a follow up appointment in 12 months   . Providers on your designated Care Team:   . Murray Hodgkins, NP . Christell Faith, PA-C . Marrianne Mood, PA-C  Any Other Special Instructions Will Be Listed Below (If Applicable).  For educational health videos Log in to : [email or web address] Or : [email or web address], password : [email or web address]

## 2019-11-02 NOTE — Progress Notes (Signed)
[facility], [place], [place] [address]  Internal MEDICINE  Office Visit Note  Patient Name: Morgan Kent  [id number]  [id number]  Date of Service: 11/07/2019  Chief Complaint  Patient presents with  . Hypertension  . Fatigue  . Asthma  . Labs Only    review labs    The patient is here for follow up visit. Had labs done prior to this visit. - iron deficiency anemia.  -heavy menstrual periods. Has to wear a tampon and pad for protection. Had seen GYN in the past due to HPV positive pap smear. She has been released from them.  -vitamin d deficiency -elevated renal functions. Does see cardiology. Had appointment this morning. They also reviewed labs with her. No changes made. Would like to monitor labs.  -worsening lupus. Rheumatologist changing some medications to help with flaire which is significantly affecting her skin -increased pain due to lupus and fibromyalgia. Sees pain management and recently increased her pain medication.       Current Medication: Outpatient Encounter Medications as of 11/02/2019  Medication Sig  . adapalene (DIFFERIN) 0.1 % gel Apply 1 application topically at bedtime. Apply to forehead and chin  . albuterol (VENTOLIN HFA) 108 (90 Base) MCG/ACT inhaler Inhale 2 puffs into the lungs daily.  . Budesonide (PULMICORT FLEXHALER) 90 MCG/ACT inhaler Inhale 2 puffs into the lungs 2 (two) times daily.  . carvedilol (COREG) 25 MG tablet TAKE 1 TABLET BY MOUTH TWICE A DAY  . cetirizine (ZYRTEC) 10 MG tablet Take 1 tablet (10 mg total) by mouth daily.  . clobetasol (TEMOVATE) 0.05 % external solution Apply 1 application topically 2 (two) times daily as needed. Apply to scalp  . cyclobenzaprine (FLEXERIL) 10 MG tablet Take 10 mg by mouth 3 (three) times daily as needed.  . diclofenac sodium (VOLTAREN) 1 % GEL Apply 1 application topically 2 (two) times daily as needed (for pain). (Patient not taking: Reported on 11/02/2019)  .  doxycycline (VIBRA-TABS) 100 MG tablet Take 100 mg by mouth 2 (two) times daily.  . ergocalciferol (DRISDOL) 1.25 MG (50000 UT) capsule Take 1 capsule (50,000 Units total) by mouth once a week.  . ferrous sulfate (FERROUSUL) 325 (65 FE) MG tablet Take 1 tablet (325 mg total) by mouth daily with breakfast.  . fluticasone (FLONASE) 50 MCG/ACT nasal spray Place 1 spray into both nostrils at bedtime.  . gabapentin (NEURONTIN) 300 MG capsule Take 1 capsule (300 mg total) by mouth 3 (three) times daily.  . hydrochlorothiazide (HYDRODIURIL) 12.5 MG tablet Take 1 tablet (12.5 mg total) by mouth daily.  . hydroxychloroquine (PLAQUENIL) 200 MG tablet Take 200 mg by mouth daily.   Marland Kitchen KETOCONAZOLE, TOPICAL, 1 % SHAM Apply 1 application topically every other day.   . lisinopril (ZESTRIL) 5 MG tablet TAKE 1 TABLET BY MOUTH EVERY DAY  . meloxicam (MOBIC) 7.5 MG tablet Take 1 tablet (7.5 mg total) by mouth 2 (two) times daily.  . mometasone (ELOCON) 0.1 % ointment Apply 1 application topically 2 (two) times daily as needed (lupus outbreaks).  . montelukast (SINGULAIR) 10 MG tablet Take 1 tablet (10 mg total) by mouth at bedtime.  . mupirocin ointment (BACTROBAN) 2 % as needed.  . norethindrone (MICRONOR) 0.35 MG tablet Take 1 tablet (0.35 mg total) by mouth daily.  Marland Kitchen oxyCODONE-acetaminophen (PERCOCET) 7.5-325 MG tablet Take 1 tablet by mouth every 4 (four) hours as needed for severe pain.  Vladimir Faster Glycol-Propyl Glycol (SYSTANE ULTRA) 0.4-0.3 % SOLN  Place 1 drop into both eyes daily as needed (for dry eyes).   . tacrolimus (PROTOPIC) 0.1 % ointment Apply 1 application topically daily.  Marland Kitchen tretinoin (RETIN-A) 0.05 % cream Apply 1 application topically at bedtime.   . triamcinolone cream (KENALOG) 0.1 % Apply 1 application topically 2 (two) times daily as needed (outbreaks).   . [DISCONTINUED] Budesonide (PULMICORT FLEXHALER) 90 MCG/ACT inhaler Inhale 2 puffs into the lungs 2 (two) times daily. (Patient not  taking: Reported on 11/02/2019)  . [DISCONTINUED] fluticasone (FLONASE) 50 MCG/ACT nasal spray Place 1 spray into both nostrils at bedtime.  . [DISCONTINUED] gabapentin (NEURONTIN) 300 MG capsule Take 1 capsule (300 mg total) by mouth 3 (three) times daily.  . [DISCONTINUED] meloxicam (MOBIC) 7.5 MG tablet Take 1 tablet (7.5 mg total) by mouth 2 (two) times daily.   No facility-administered encounter medications on file as of 11/02/2019.    Surgical History: Past Surgical History:  Procedure Laterality Date  . carpel tunnel  Left 2014  . CRANIOTOMY  14,06   chiari  . DENTAL SURGERY    . HERNIA REPAIR     umbilical  . PLACEMENT OF LUMBAR DRAIN N/A 07/07/2014   Procedure: PLACEMENT OF LUMBAR DRAIN AND CLOSURE OF CERVICAL INCISION;  Surgeon: Newman Pies, MD;  Location: [facility];  Service: Neurosurgery;  Laterality: N/A;  . SUBOCCIPITAL CRANIECTOMY CERVICAL LAMINECTOMY N/A 09/29/2013   Procedure: SUBOCCIPITAL CRANIECTOMY CERVICAL LAMINECTOMY/DURAPLASTY;  Surgeon: Ophelia Charter, MD;  Location: [facility];  Service: Neurosurgery;  Laterality: N/A;  posterior  . SUBOCCIPITAL CRANIECTOMY CERVICAL LAMINECTOMY N/A 06/23/2014   Procedure: SUBOCCIPITAL CRANIECTOMY CERVICAL LAMINECTOMY/DURAPLASTY;  Surgeon: Newman Pies, MD;  Location: [facility];  Service: Neurosurgery;  Laterality: N/A;  suboccipital craniectomy with cervical laminectomy and duraplasty    Medical History: Past Medical History:  Diagnosis Date  . Anxiety   . Arthritis    rheumo possible  . Chiari malformation   . Depression   . Dizziness   . Headache(784.0)   . History of kidney stones   . Hypertension   . Lupus ([facility])   . Lupus ([place])   . Nerve damage    NECK  . PONV (postoperative nausea and vomiting)    nausea only  . TMJ arthritis     Family History: Family History  Problem Relation Age of Onset  . Heart disease Father   . Hypertension Father   . Hypertension Mother   . Thyroid disease  Mother   . Asthma Other   . Depression Other   . Diabetes Other   . Migraines Other   . Stroke Other     Social History   Socioeconomic History  . Marital status: Single    Spouse name: Not on file  . Number of children: Not on file  . Years of education: Not on file  . Highest education level: Not on file  Occupational History  . Not on file  Tobacco Use  . Smoking status: Never Smoker  . Smokeless tobacco: Never Used  Substance and Sexual Activity  . Alcohol use: No    Alcohol/week: 0.0 standard drinks  . Drug use: No  . Sexual activity: Not on file  Other Topics Concern  . Not on file  Social History Narrative   Lives with 3 children in 2 story home.  Unemployed.  Trying to get disability.  Education: high school.   Social Determinants of Health   Financial Resource Strain:   . Difficulty of Paying Living  Expenses: Not on file  Food Insecurity:   . Worried About Charity fundraiser in the Last Year: Not on file  . Ran Out of Food in the Last Year: Not on file  Transportation Needs:   . Lack of Transportation (Medical): Not on file  . Lack of Transportation (Non-Medical): Not on file  Physical Activity:   . Days of Exercise per Week: Not on file  . Minutes of Exercise per Session: Not on file  Stress:   . Feeling of Stress : Not on file  Social Connections:   . Frequency of Communication with Friends and Family: Not on file  . Frequency of Social Gatherings with Friends and Family: Not on file  . Attends Religious Services: Not on file  . Active Member of Clubs or Organizations: Not on file  . Attends Archivist Meetings: Not on file  . Marital Status: Not on file  Intimate Partner Violence:   . Fear of Current or Ex-Partner: Not on file  . Emotionally Abused: Not on file  . Physically Abused: Not on file  . Sexually Abused: Not on file      Review of Systems  Constitutional: Positive for fatigue. Negative for activity change, chills and  unexpected weight change.  HENT: Negative for congestion, ear pain, postnasal drip, rhinorrhea, sinus pressure, sinus pain, sneezing and sore throat.   Respiratory: Negative for cough, chest tightness, shortness of breath and wheezing.   Cardiovascular: Negative for chest pain and palpitations.  Gastrointestinal: Negative for abdominal pain, constipation, diarrhea, nausea and vomiting.  Endocrine: Negative for cold intolerance, heat intolerance, polydipsia and polyuria.  Genitourinary: Positive for menstrual problem.       Heavy menstrual cycles with lots of clotting.   Musculoskeletal: Positive for back pain and myalgias. Negative for arthralgias, joint swelling and neck pain.       Sees pain management provider   Skin: Negative for rash.       Skin changes related to lupus.   Allergic/Immunologic: Positive for environmental allergies.  Neurological: Positive for numbness and headaches. Negative for tremors and weakness.       Has noted numbness and burning to the tips of her fingers. Has also noted blue discoloration and cold feeling to the fingertips.   Hematological: Negative for adenopathy. Does not bruise/bleed easily.  Psychiatric/Behavioral: Negative for behavioral problems (Depression), sleep disturbance and suicidal ideas. The patient is nervous/anxious.    Today's Vitals   11/02/19 1031  BP: 130/88  Pulse: (!) 101  Resp: 18  Temp: (!) 97.3 F (36.3 C)  TempSrc: Temporal  SpO2: 99%  Weight: 238 lb 6.4 oz (108.1 kg)  Height: 5\' 6"  (1.676 m)   Body mass index is 38.48 kg/m.  Physical Exam Vitals and nursing note reviewed.  Constitutional:      General: She is not in acute distress.    Appearance: She is well-developed. She is obese. She is not diaphoretic.  HENT:     Head: Normocephalic and atraumatic.     Right Ear: External ear normal.     Left Ear: External ear normal.     Nose: Nose normal.     Mouth/Throat:     Pharynx: No oropharyngeal exudate.  Eyes:      Pupils: Pupils are equal, round, and reactive to light.  Neck:     Thyroid: No thyromegaly.     Vascular: No JVD.     Trachea: No tracheal deviation.  Cardiovascular:  Rate and Rhythm: Normal rate and regular rhythm.     Pulses: Normal pulses.     Heart sounds: Normal heart sounds. No murmur. No friction rub. No gallop.   Pulmonary:     Effort: Pulmonary effort is normal. No respiratory distress.     Breath sounds: Normal breath sounds. No wheezing or rales.  Chest:     Chest wall: No tenderness.  Abdominal:     General: Bowel sounds are normal.     Palpations: Abdomen is soft.     Tenderness: There is no abdominal tenderness.  Musculoskeletal:     Cervical back: Normal range of motion and neck supple.  Lymphadenopathy:     Cervical: No cervical adenopathy.  Skin:    General: Skin is warm and dry.  Neurological:     Mental Status: She is alert and oriented to person, place, and time. Mental status is at baseline.     Cranial Nerves: No cranial nerve deficit.  Psychiatric:        Behavior: Behavior normal.        Thought Content: Thought content normal.        Judgment: Judgment normal.    Assessment/Plan: 1. Menorrhagia with regular cycle Patient with very heavy menstrual periods and cramping. Will get transvaginal pelvic ultrasound for further evaluation. Refer to GYN as indicated.  - US Pelvic Complete With Transvaginal; Future  2. Iron deficiency anemia, unspecified iron deficiency anemia type Reviewed recent labs showing iron deficiency anemia. Start iron supplement daily. Transvaginal pelvic ultrasound ordered for further evaluation. Will refer to GI for screening colonoscopy/endoscopy as indicated.  - ferrous sulfate (FERROUSUL) 325 (65 FE) MG tablet; Take 1 tablet (325 mg total) by mouth daily with breakfast.  Dispense: 90 tablet; Refill: 1 - US Pelvic Complete With Transvaginal; Future  3. Mild intermittent asthma without complication Continue pulmicort twice  daily. Use rescue inhaler as needed and as prescribed  - Budesonide (PULMICORT FLEXHALER) 90 MCG/ACT inhaler; Inhale 2 puffs into the lungs 2 (two) times daily.  Dispense: 3 each; Refill: 1  4. Allergic rhinitis, unspecified seasonality, unspecified trigger - fluticasone (FLONASE) 50 MCG/ACT nasal spray; Place 1 spray into both nostrils at bedtime.  Dispense: 48 g; Refill: 3  5. Inflammatory polyarthritis (HCC) Continue meloxicam 7.5mg  twice daily as needed for pain/inflammation.  - meloxicam (MOBIC) 7.5 MG tablet; Take 1 tablet (7.5 mg total) by mouth 2 (two) times daily.  Dispense: 180 tablet; Refill: 1  6. Neuralgia gapapentin 300mg  should be continued three times daily.  - gabapentin (NEURONTIN) 300 MG capsule; Take 1 capsule (300 mg total) by mouth 3 (three) times daily.  Dispense: 270 capsule; Refill: 1  7. Vitamin D deficiency - ergocalciferol (DRISDOL) 1.25 MG (50000 UT) capsule; Take 1 capsule (50,000 Units total) by mouth once a week.  Dispense: 4 capsule; Refill: 5  General Counseling: Morgan Kent verbalizes understanding of the findings of todays visit and agrees with plan of treatment. I have discussed any further diagnostic evaluation that may be needed or ordered today. We also reviewed her medications today. she has been encouraged to call the office with any questions or concerns that should arise related to todays visit.  This patient was seen by Leretha Pol FNP Collaboration with Dr Lavera Guise as a part of collaborative care agreement  Orders Placed This Encounter  Procedures  . US Pelvic Complete With Transvaginal    Meds ordered this encounter  Medications  . Budesonide (PULMICORT FLEXHALER) 90 MCG/ACT inhaler  Sig: Inhale 2 puffs into the lungs 2 (two) times daily.    Dispense:  3 each    Refill:  1    Order Specific Question:   Supervising Provider    Answer:   Lavera Guise [id number]  . fluticasone (FLONASE) 50 MCG/ACT nasal spray    Sig: Place 1 spray into  both nostrils at bedtime.    Dispense:  48 g    Refill:  3    Order Specific Question:   Supervising Provider    Answer:   Lavera Guise [id number]  . gabapentin (NEURONTIN) 300 MG capsule    Sig: Take 1 capsule (300 mg total) by mouth 3 (three) times daily.    Dispense:  270 capsule    Refill:  1    Order Specific Question:   Supervising Provider    Answer:   Lavera Guise [id number]  . meloxicam (MOBIC) 7.5 MG tablet    Sig: Take 1 tablet (7.5 mg total) by mouth 2 (two) times daily.    Dispense:  180 tablet    Refill:  1    Order Specific Question:   Supervising Provider    Answer:   Lavera Guise [id number]  . ergocalciferol (DRISDOL) 1.25 MG (50000 UT) capsule    Sig: Take 1 capsule (50,000 Units total) by mouth once a week.    Dispense:  4 capsule    Refill:  5    Order Specific Question:   Supervising Provider    Answer:   Lavera Guise [id number]  . ferrous sulfate (FERROUSUL) 325 (65 FE) MG tablet    Sig: Take 1 tablet (325 mg total) by mouth daily with breakfast.    Dispense:  90 tablet    Refill:  1    Order Specific Question:   Supervising Provider    Answer:   Lavera Guise [id number]    Time spent: 48 Minutes      Dr Lavera Guise Internal medicine

## 2019-11-02 NOTE — Telephone Encounter (Signed)
Patient scheduled at [facility] on 11/15/2019 @ 1:45pm. [phone] klh

## 2019-11-07 ENCOUNTER — Encounter: Payer: Self-pay | Admitting: Nurse Practitioner

## 2019-11-07 DIAGNOSIS — D509 Iron deficiency anemia, unspecified: Secondary | ICD-10-CM | POA: Insufficient documentation

## 2019-11-07 DIAGNOSIS — M064 Inflammatory polyarthropathy: Secondary | ICD-10-CM | POA: Insufficient documentation

## 2019-11-07 DIAGNOSIS — N920 Excessive and frequent menstruation with regular cycle: Secondary | ICD-10-CM | POA: Insufficient documentation

## 2019-11-15 ENCOUNTER — Other Ambulatory Visit: Payer: Self-pay

## 2019-11-15 ENCOUNTER — Ambulatory Visit
Admission: RE | Admit: 2019-11-15 | Discharge: 2019-11-15 | Disposition: A | Discharge status: Home / Self Care | Payer: Medicare Other | Source: Ambulatory Visit | Attending: Nurse Practitioner | Admitting: Nurse Practitioner

## 2019-11-15 DIAGNOSIS — D509 Iron deficiency anemia, unspecified: Secondary | ICD-10-CM | POA: Diagnosis not present

## 2019-11-15 DIAGNOSIS — N920 Excessive and frequent menstruation with regular cycle: Secondary | ICD-10-CM | POA: Diagnosis not present

## 2019-11-15 NOTE — Progress Notes (Signed)
Discuss with patient at visit 11/26/2019. Refer to GYN

## 2019-11-19 DIAGNOSIS — M545 Low back pain: Secondary | ICD-10-CM | POA: Diagnosis not present

## 2019-11-19 DIAGNOSIS — M79661 Pain in right lower leg: Secondary | ICD-10-CM | POA: Diagnosis not present

## 2019-11-19 DIAGNOSIS — Z713 Dietary counseling and surveillance: Secondary | ICD-10-CM | POA: Diagnosis not present

## 2019-11-19 DIAGNOSIS — G629 Polyneuropathy, unspecified: Secondary | ICD-10-CM | POA: Diagnosis not present

## 2019-11-19 DIAGNOSIS — Z6839 Body mass index (BMI) 39.0-39.9, adult: Secondary | ICD-10-CM | POA: Diagnosis not present

## 2019-11-19 DIAGNOSIS — I10 Essential (primary) hypertension: Secondary | ICD-10-CM | POA: Diagnosis not present

## 2019-11-19 DIAGNOSIS — M542 Cervicalgia: Secondary | ICD-10-CM | POA: Diagnosis not present

## 2019-11-19 DIAGNOSIS — M5412 Radiculopathy, cervical region: Secondary | ICD-10-CM | POA: Diagnosis not present

## 2019-11-19 DIAGNOSIS — G909 Disorder of the autonomic nervous system, unspecified: Secondary | ICD-10-CM | POA: Diagnosis not present

## 2019-11-19 DIAGNOSIS — M79662 Pain in left lower leg: Secondary | ICD-10-CM | POA: Diagnosis not present

## 2019-11-19 DIAGNOSIS — M25512 Pain in left shoulder: Secondary | ICD-10-CM | POA: Diagnosis not present

## 2019-11-19 DIAGNOSIS — M25511 Pain in right shoulder: Secondary | ICD-10-CM | POA: Diagnosis not present

## 2019-11-24 ENCOUNTER — Telehealth: Payer: Self-pay

## 2019-11-24 NOTE — Telephone Encounter (Signed)
CONFIRMED 11-26-19 OV AS VIRTUAL.

## 2019-11-26 ENCOUNTER — Other Ambulatory Visit: Payer: Self-pay

## 2019-11-26 ENCOUNTER — Other Ambulatory Visit: Payer: Medicare Other

## 2019-11-26 ENCOUNTER — Encounter: Payer: Self-pay | Admitting: Nurse Practitioner

## 2019-11-26 ENCOUNTER — Ambulatory Visit (INDEPENDENT_AMBULATORY_CARE_PROVIDER_SITE_OTHER): Payer: Medicare Other | Admitting: Nurse Practitioner

## 2019-11-26 VITALS — Temp 97.1°F | Ht 66.0 in

## 2019-11-26 DIAGNOSIS — N920 Excessive and frequent menstruation with regular cycle: Secondary | ICD-10-CM

## 2019-11-26 DIAGNOSIS — N83201 Unspecified ovarian cyst, right side: Secondary | ICD-10-CM

## 2019-11-26 DIAGNOSIS — E538 Deficiency of other specified B group vitamins: Secondary | ICD-10-CM

## 2019-11-26 DIAGNOSIS — D509 Iron deficiency anemia, unspecified: Secondary | ICD-10-CM

## 2019-11-26 NOTE — Progress Notes (Signed)
[facility] [place],  [address]  Internal MEDICINE  Telephone Visit  Patient Name: Morgan Kent  [id number]  [id number]  Date of Service: 11/26/2019  I connected with the patient at 3:07pm by webcam and verified the patients identity using two identifiers.   I discussed the limitations, risks, security and privacy concerns of performing an evaluation and management service by webcam and the availability of in person appointments. I also discussed with the patient that there may be a patient responsible charge related to the service.  The patient expressed understanding and agrees to proceed.    Chief Complaint  Patient presents with  . Telephone Assessment  . Telephone Screen  . Follow-up    ULTRASOUND RESULTS    The patient has been contacted via webcam for follow up visit due to concerns for spread of novel coronavirus. The patient presents for follow up visit. Her most recent labs showed iron deficiency anemia. Hgb was 9.8. She has heavy menstrual periods. Has to wear a tampon and pad for protection. She has been released from them. She had pelvic ultrasound done for further evaluation. She has 2.2cm ovarian cyst, possibly hemorrhagic. She is taking iron supplement and will start OTC B12 supplement to help improve anemia.       Current Medication: Outpatient Encounter Medications as of 11/26/2019  Medication Sig  . adapalene (DIFFERIN) 0.1 % gel Apply 1 application topically at bedtime. Apply to forehead and chin  . albuterol (VENTOLIN HFA) 108 (90 Base) MCG/ACT inhaler Inhale 2 puffs into the lungs daily.  . Budesonide (PULMICORT FLEXHALER) 90 MCG/ACT inhaler Inhale 2 puffs into the lungs 2 (two) times daily.  . carvedilol (COREG) 25 MG tablet TAKE 1 TABLET BY MOUTH TWICE A DAY  . cetirizine (ZYRTEC) 10 MG tablet Take 1 tablet (10 mg total) by mouth daily.  . clobetasol (TEMOVATE) 0.05 % external solution Apply 1 application topically 2 (two)  times daily as needed. Apply to scalp  . cyclobenzaprine (FLEXERIL) 10 MG tablet Take 10 mg by mouth 3 (three) times daily as needed.  . diclofenac sodium (VOLTAREN) 1 % GEL Apply 1 application topically 2 (two) times daily as needed (for pain).  Marland Kitchen doxycycline (VIBRA-TABS) 100 MG tablet Take 100 mg by mouth 2 (two) times daily.  . ergocalciferol (DRISDOL) 1.25 MG (50000 UT) capsule Take 1 capsule (50,000 Units total) by mouth once a week.  . ferrous sulfate (FERROUSUL) 325 (65 FE) MG tablet Take 1 tablet (325 mg total) by mouth daily with breakfast.  . fluticasone (FLONASE) 50 MCG/ACT nasal spray Place 1 spray into both nostrils at bedtime.  . gabapentin (NEURONTIN) 300 MG capsule Take 1 capsule (300 mg total) by mouth 3 (three) times daily.  . hydrochlorothiazide (HYDRODIURIL) 12.5 MG tablet Take 1 tablet (12.5 mg total) by mouth daily.  . hydroxychloroquine (PLAQUENIL) 200 MG tablet Take 200 mg by mouth daily.   Marland Kitchen KETOCONAZOLE, TOPICAL, 1 % SHAM Apply 1 application topically every other day.   . lisinopril (ZESTRIL) 5 MG tablet TAKE 1 TABLET BY MOUTH EVERY DAY  . meloxicam (MOBIC) 7.5 MG tablet Take 1 tablet (7.5 mg total) by mouth 2 (two) times daily.  . mometasone (ELOCON) 0.1 % ointment Apply 1 application topically 2 (two) times daily as needed (lupus outbreaks).  . montelukast (SINGULAIR) 10 MG tablet Take 1 tablet (10 mg total) by mouth at bedtime.  . mupirocin ointment (BACTROBAN) 2 % as needed.  . norethindrone (MICRONOR) 0.35 MG  tablet Take 1 tablet (0.35 mg total) by mouth daily.  Marland Kitchen oxyCODONE-acetaminophen (PERCOCET) 7.5-325 MG tablet Take 1 tablet by mouth every 4 (four) hours as needed for severe pain.  Vladimir Faster Glycol-Propyl Glycol (SYSTANE ULTRA) 0.4-0.3 % SOLN Place 1 drop into both eyes daily as needed (for dry eyes).   . tacrolimus (PROTOPIC) 0.1 % ointment Apply 1 application topically daily.  Marland Kitchen tretinoin (RETIN-A) 0.05 % cream Apply 1 application topically at bedtime.   .  triamcinolone cream (KENALOG) 0.1 % Apply 1 application topically 2 (two) times daily as needed (outbreaks).    No facility-administered encounter medications on file as of 11/26/2019.    Surgical History: Past Surgical History:  Procedure Laterality Date  . carpel tunnel  Left 2014  . CRANIOTOMY  14,06   chiari  . DENTAL SURGERY    . HERNIA REPAIR     umbilical  . PLACEMENT OF LUMBAR DRAIN N/A 07/07/2014   Procedure: PLACEMENT OF LUMBAR DRAIN AND CLOSURE OF CERVICAL INCISION;  Surgeon: Newman Pies, MD;  Location: [place] [facility];  Service: Neurosurgery;  Laterality: N/A;  . SUBOCCIPITAL CRANIECTOMY CERVICAL LAMINECTOMY N/A 09/29/2013   Procedure: SUBOCCIPITAL CRANIECTOMY CERVICAL LAMINECTOMY/DURAPLASTY;  Surgeon: Ophelia Charter, MD;  Location: [place] [facility];  Service: Neurosurgery;  Laterality: N/A;  posterior  . SUBOCCIPITAL CRANIECTOMY CERVICAL LAMINECTOMY N/A 06/23/2014   Procedure: SUBOCCIPITAL CRANIECTOMY CERVICAL LAMINECTOMY/DURAPLASTY;  Surgeon: Newman Pies, MD;  Location: [place] [facility];  Service: Neurosurgery;  Laterality: N/A;  suboccipital craniectomy with cervical laminectomy and duraplasty    Medical History: Past Medical History:  Diagnosis Date  . Anxiety   . Arthritis    rheumo possible  . Chiari malformation   . Depression   . Dizziness   . Headache(784.0)   . History of kidney stones   . Hypertension   . Lupus (Home)   . Lupus ([place])   . Nerve damage    NECK  . PONV (postoperative nausea and vomiting)    nausea only  . TMJ arthritis     Family History: Family History  Problem Relation Age of Onset  . Heart disease Father   . Hypertension Father   . Hypertension Mother   . Thyroid disease Mother   . Asthma Other   . Depression Other   . Diabetes Other   . Migraines Other   . Stroke Other     Social History   Socioeconomic History  . Marital status: Single    Spouse name: Not on file  . Number of children: Not on file  . Years of  education: Not on file  . Highest education level: Not on file  Occupational History  . Not on file  Tobacco Use  . Smoking status: Never Smoker  . Smokeless tobacco: Never Used  Substance and Sexual Activity  . Alcohol use: No    Alcohol/week: 0.0 standard drinks  . Drug use: No  . Sexual activity: Not on file  Other Topics Concern  . Not on file  Social History Narrative   Lives with 3 children in 2 story home.  Unemployed.  Trying to get disability.  Education: high school.   Social Determinants of Health   Financial Resource Strain:   . Difficulty of Paying Living Expenses: Not on file  Food Insecurity:   . Worried About Charity fundraiser in the Last Year: Not on file  . Ran Out of Food in the Last Year: Not on file  Transportation Needs:   .  Lack of Transportation (Medical): Not on file  . Lack of Transportation (Non-Medical): Not on file  Physical Activity:   . Days of Exercise per Week: Not on file  . Minutes of Exercise per Session: Not on file  Stress:   . Feeling of Stress : Not on file  Social Connections:   . Frequency of Communication with Friends and Family: Not on file  . Frequency of Social Gatherings with Friends and Family: Not on file  . Attends Religious Services: Not on file  . Active Member of Clubs or Organizations: Not on file  . Attends Archivist Meetings: Not on file  . Marital Status: Not on file  Intimate Partner Violence:   . Fear of Current or Ex-Partner: Not on file  . Emotionally Abused: Not on file  . Physically Abused: Not on file  . Sexually Abused: Not on file      Review of Systems  Constitutional: Positive for fatigue. Negative for activity change, chills and unexpected weight change.  HENT: Negative for congestion, ear pain, postnasal drip, rhinorrhea, sinus pressure, sinus pain, sneezing and sore throat.   Respiratory: Negative for cough, chest tightness, shortness of breath and wheezing.   Cardiovascular:  Negative for chest pain and palpitations.  Gastrointestinal: Negative for abdominal pain, constipation, diarrhea, nausea and vomiting.  Endocrine: Negative for cold intolerance, heat intolerance, polydipsia and polyuria.  Genitourinary: Positive for menstrual problem.       Heavy menstrual cycles with lots of clotting.   Musculoskeletal: Positive for back pain and myalgias. Negative for arthralgias, joint swelling and neck pain.       Sees pain management provider   Skin: Negative for rash.       Skin changes related to lupus.   Allergic/Immunologic: Positive for environmental allergies.  Neurological: Positive for numbness and headaches. Negative for tremors and weakness.       Has noted numbness and burning to the tips of her fingers. Has also noted blue discoloration and cold feeling to the fingertips.   Hematological: Negative for adenopathy. Does not bruise/bleed easily.  Psychiatric/Behavioral: Negative for behavioral problems (Depression), sleep disturbance and suicidal ideas. The patient is nervous/anxious.     Today's Vitals   11/26/19 1424  Temp: (!) 97.1 F (36.2 C)  Height: 5\' 6"  (1.676 m)   Body mass index is 38.48 kg/m.  Observation/Objective:    The patient is alert and oriented. She is pleasant and answers all questions appropriately. Breathing is non-labored. She is in no acute distress at this time.   Assessment/Plan:  1. Hemorrhagic cyst of right ovary Reviewed ultrasound results with the patient. Does show 2.2cm cyst on right ovary, possibly hemorrhagic. Refer to GYN for further evaluation.  - Ambulatory referral to Gynecology  2. Menorrhagia with regular cycle Reviewed ultrasound results with the patient. Does show 2.2cm cyst on right ovary, possibly hemorrhagic. Refer to GYN for further evaluation.    3. Iron deficiency anemia, unspecified iron deficiency anemia type Continue with iron supplement. Recommend OTC b12 supplement daily.   4. Vitamin B12  deficiency  Recommend OTC b12 supplement daily.   This patient was seen by Leretha Pol FNP Collaboration with Dr Lavera Guise as a part of collaborative care agreement  General Counseling: Morgan Kent verbalizes understanding of the findings of today's phone visit and agrees with plan of treatment. I have discussed any further diagnostic evaluation that may be needed or ordered today. We also reviewed her medications today. she has been encouraged  to call the office with any questions or concerns that should arise related to todays visit.    Orders Placed This Encounter  Procedures  . Ambulatory referral to Gynecology      Time spent: [place] [facility]

## 2019-12-08 ENCOUNTER — Other Ambulatory Visit: Payer: Self-pay

## 2019-12-08 DIAGNOSIS — Z87898 Personal history of other specified conditions: Secondary | ICD-10-CM | POA: Diagnosis not present

## 2019-12-08 DIAGNOSIS — L219 Seborrheic dermatitis, unspecified: Secondary | ICD-10-CM | POA: Diagnosis not present

## 2019-12-08 DIAGNOSIS — L930 Discoid lupus erythematosus: Secondary | ICD-10-CM | POA: Diagnosis not present

## 2019-12-08 DIAGNOSIS — M329 Systemic lupus erythematosus, unspecified: Secondary | ICD-10-CM | POA: Diagnosis not present

## 2019-12-08 DIAGNOSIS — L700 Acne vulgaris: Secondary | ICD-10-CM | POA: Diagnosis not present

## 2019-12-08 MED ORDER — MONTELUKAST SODIUM 10 MG PO TABS: 10.0000 mg | ORAL_TABLET | Freq: Every day | ORAL | 3 refills | Status: DC

## 2019-12-11 ENCOUNTER — Other Ambulatory Visit: Payer: Self-pay | Admitting: Cardiovascular Disease

## 2019-12-13 ENCOUNTER — Other Ambulatory Visit: Payer: Self-pay

## 2019-12-13 MED ORDER — LISINOPRIL 5 MG PO TABS: 5.0000 mg | ORAL_TABLET | Freq: Every day | ORAL | 3 refills | Status: DC

## 2019-12-29 ENCOUNTER — Ambulatory Visit (INDEPENDENT_AMBULATORY_CARE_PROVIDER_SITE_OTHER): Payer: Medicare Other | Admitting: Obstetrics and Gynecology

## 2019-12-29 ENCOUNTER — Other Ambulatory Visit: Payer: Self-pay

## 2019-12-29 ENCOUNTER — Encounter: Payer: Self-pay | Admitting: Obstetrics and Gynecology

## 2019-12-29 VITALS — BP 137/86 | HR 96 | Ht 66.0 in | Wt 236.6 lb

## 2019-12-29 DIAGNOSIS — Z30011 Encounter for initial prescription of contraceptive pills: Secondary | ICD-10-CM

## 2019-12-29 DIAGNOSIS — N921 Excessive and frequent menstruation with irregular cycle: Secondary | ICD-10-CM

## 2019-12-29 DIAGNOSIS — N83201 Unspecified ovarian cyst, right side: Secondary | ICD-10-CM

## 2019-12-29 DIAGNOSIS — N83209 Unspecified ovarian cyst, unspecified side: Secondary | ICD-10-CM | POA: Diagnosis not present

## 2019-12-29 DIAGNOSIS — N920 Excessive and frequent menstruation with regular cycle: Secondary | ICD-10-CM

## 2019-12-29 MED ORDER — NORETHIN ACE-ETH ESTRAD-FE 1.5-30 MG-MCG PO TABS: 1.0000 | ORAL_TABLET | Freq: Every day | ORAL | 2 refills | Status: DC

## 2019-12-29 NOTE — Progress Notes (Signed)
HPI:      [name] is a 39 y.o. No obstetric history on file. who LMP was Patient's last menstrual period was 12/22/2019.  Subjective:   She presents today referred because of a small likely hemorrhagic ovarian cyst as well as heavy menstrual bleeding.  The cyst was found approximately 2 weeks ago on ultrasound. Patient states that she has had heavy menstrual bleeding her entire life.  She is currently taking Micronor for birth Bernie Covey is not sexually active at this time. She previously had a "copper IUD" and she did not like it. Recent hemoglobin was low and she is taking iron. No evidence of uterine fibroids noted on ultrasound.    Hx: The following portions of the patient's history were reviewed and updated as appropriate:             She  has a past medical history of Anxiety, Arthritis, Chiari malformation, Depression, Dizziness, Headache(784.0), History of kidney stones, Hypertension, Lupus ([place]), Lupus ([place]), Nerve damage, PONV (postoperative nausea and vomiting), and TMJ arthritis. She does not have any pertinent problems on file. She  has a past surgical history that includes Craniotomy (14,06); carpel tunnel  (Left, 2014); Dental surgery; Hernia repair; Suboccipital craniectomy cervical laminectomy (N/A, 09/29/2013); Suboccipital craniectomy cervical laminectomy (N/A, 06/23/2014); and Placement of lumbar drain (N/A, 07/07/2014). Her family history includes Asthma in an other family member; Depression in an other family member; Diabetes in an other family member; Heart disease in her father; Hypertension in her father and mother; Migraines in an other family member; Stroke in an other family member; Thyroid disease in her mother. She  reports that she has never smoked. She has never used smokeless tobacco. She reports that she does not drink alcohol or use drugs. She has a current medication list which includes the following prescription(s): albuterol, pulmicort flexhaler,  carvedilol, cetirizine, clobetasol, cyclobenzaprine, ergocalciferol, fluticasone, gabapentin, hydrochlorothiazide, hydroxychloroquine, ketoconazole (topical), lisinopril, meloxicam, mometasone, montelukast, mupirocin ointment, norethindrone, oxycodone-acetaminophen, systane ultra, adapalene, diclofenac sodium, doxycycline, ferrous sulfate, tacrolimus, tretinoin, and triamcinolone cream. She is allergic to other and cephalexin.       Review of Systems:  Review of Systems  Constitutional: Denied constitutional symptoms, night sweats, recent illness, fatigue, fever, insomnia and weight loss.  Eyes: Denied eye symptoms, eye pain, photophobia, vision change and visual disturbance.  Ears/Nose/Throat/Neck: Denied ear, nose, throat or neck symptoms, hearing loss, nasal discharge, sinus congestion and sore throat.  Cardiovascular: Denied cardiovascular symptoms, arrhythmia, chest pain/pressure, edema, exercise intolerance, orthopnea and palpitations.  Respiratory: Denied pulmonary symptoms, asthma, pleuritic pain, productive sputum, cough, dyspnea and wheezing.  Gastrointestinal: Denied, gastro-esophageal reflux, melena, nausea and vomiting.  Genitourinary: See HPI for additional information.  Musculoskeletal: Denied musculoskeletal symptoms, stiffness, swelling, muscle weakness and myalgia.  Dermatologic: Denied dermatology symptoms, rash and scar.  Neurologic: Denied neurology symptoms, dizziness, headache, neck pain and syncope.  Psychiatric: Denied psychiatric symptoms, anxiety and depression.  Endocrine: Denied endocrine symptoms including hot flashes and night sweats.   Meds:   Current Outpatient Medications on File Prior to Visit  Medication Sig Dispense Refill  . albuterol (VENTOLIN HFA) 108 (90 Base) MCG/ACT inhaler Inhale 2 puffs into the lungs daily. 18 g 5  . Budesonide (PULMICORT FLEXHALER) 90 MCG/ACT inhaler Inhale 2 puffs into the lungs 2 (two) times daily. 3 each 1  . carvedilol  (COREG) 25 MG tablet TAKE 1 TABLET BY MOUTH TWICE A DAY 180 tablet 3  . cetirizine (ZYRTEC) 10 MG tablet Take 1 tablet (10 mg  total) by mouth daily. 30 tablet 5  . clobetasol (TEMOVATE) 0.05 % external solution Apply 1 application topically 2 (two) times daily as needed. Apply to scalp    . cyclobenzaprine (FLEXERIL) 10 MG tablet Take 10 mg by mouth 3 (three) times daily as needed.    . ergocalciferol (DRISDOL) 1.25 MG (50000 UT) capsule Take 1 capsule (50,000 Units total) by mouth once a week. 4 capsule 5  . fluticasone (FLONASE) 50 MCG/ACT nasal spray Place 1 spray into both nostrils at bedtime. 48 g 3  . gabapentin (NEURONTIN) 300 MG capsule Take 1 capsule (300 mg total) by mouth 3 (three) times daily. 270 capsule 1  . hydrochlorothiazide (HYDRODIURIL) 12.5 MG tablet Take 1 tablet (12.5 mg total) by mouth daily. 90 tablet 3  . hydroxychloroquine (PLAQUENIL) 200 MG tablet Take 200 mg by mouth daily.     Marland Kitchen KETOCONAZOLE, TOPICAL, 1 % SHAM Apply 1 application topically every other day.     . lisinopril (ZESTRIL) 5 MG tablet Take 1 tablet (5 mg total) by mouth daily. 90 tablet 3  . meloxicam (MOBIC) 7.5 MG tablet Take 1 tablet (7.5 mg total) by mouth 2 (two) times daily. 180 tablet 1  . mometasone (ELOCON) 0.1 % ointment Apply 1 application topically 2 (two) times daily as needed (lupus outbreaks).    . montelukast (SINGULAIR) 10 MG tablet Take 1 tablet (10 mg total) by mouth at bedtime. 90 tablet 3  . mupirocin ointment (BACTROBAN) 2 % as needed.    . norethindrone (MICRONOR) 0.35 MG tablet Take 1 tablet (0.35 mg total) by mouth daily. 3 Package 3  . oxyCODONE-acetaminophen (PERCOCET) 7.5-325 MG tablet Take 1 tablet by mouth every 4 (four) hours as needed for severe pain.    Vladimir Faster Glycol-Propyl Glycol (SYSTANE ULTRA) 0.4-0.3 % SOLN Place 1 drop into both eyes daily as needed (for dry eyes).     Marland Kitchen adapalene (DIFFERIN) 0.1 % gel Apply 1 application topically at bedtime. Apply to forehead and  chin    . diclofenac sodium (VOLTAREN) 1 % GEL Apply 1 application topically 2 (two) times daily as needed (for pain). (Patient not taking: Reported on 12/29/2019) 300 g 3  . doxycycline (VIBRA-TABS) 100 MG tablet Take 100 mg by mouth 2 (two) times daily.    . ferrous sulfate (FERROUSUL) 325 (65 FE) MG tablet Take 1 tablet (325 mg total) by mouth daily with breakfast. (Patient not taking: Reported on 12/29/2019) 90 tablet 1  . tacrolimus (PROTOPIC) 0.1 % ointment Apply 1 application topically daily.    Marland Kitchen tretinoin (RETIN-A) 0.05 % cream Apply 1 application topically at bedtime.   0  . triamcinolone cream (KENALOG) 0.1 % Apply 1 application topically 2 (two) times daily as needed (outbreaks).   0   No current facility-administered medications on file prior to visit.    Objective:     Vitals:   12/29/19 0856  BP: 137/86  Pulse: 96              Ultrasound findings reviewed directly with the patient  Assessment:    No obstetric history on file. Patient Active Problem List   Diagnosis Date Noted  . Hemorrhagic cyst of right ovary 11/26/2019  . Menorrhagia with regular cycle 11/07/2019  . Iron deficiency anemia 11/07/2019  . Inflammatory polyarthritis ([place]) 11/07/2019  . Vitamin B12 deficiency 08/03/2019  . Raynaud's disease without gangrene 08/03/2019  . Mild intermittent asthma without complication 0000000  . BMI 39.0-39.9,adult 06/19/2019  . Sore  throat 12/23/2018  . Flu-like symptoms 12/23/2018  . Arnold-Chiari syndrome ([place]) 12/08/2018  . Candidiasis 12/08/2018  . Body mass index 40.0-44.9, adult ([place]) 12/08/2018  . Impaired fasting glucose 09/07/2018  . Other fatigue 09/07/2018  . Vitamin D deficiency 09/07/2018  . Encounter for annual general medical examination with abnormal findings in adult 05/28/2018  . Screening for malignant neoplasm of cervix 05/28/2018  . Dysuria 05/28/2018  . Allergic rhinitis 04/16/2018  . Acute recurrent sinusitis 03/20/2018  . Tachycardia  08/20/2016  . Shortness of breath 08/20/2016  . Congestive dilated cardiomyopathy ([place]) 11/22/2015  . Lupus ([place]) 11/22/2015  . Moderate obesity 11/22/2015  . Obstructive sleep apnea on CPAP 11/22/2015  . Chronic systolic CHF (congestive heart failure) ([place]) 08/21/2015  . Numbness and tingling 08/04/2014  . Chiari malformation 08/04/2014  . Sleep disorder 08/04/2014  . Postprocedural pseudomeningocele 07/07/2014  . Chiari malformation type I ([place]) 06/23/2014  . Carpal tunnel syndrome 02/14/2014  . Depression 02/14/2014  . Neuralgia 02/14/2014  . Migraines 02/14/2014  . Sleep apnea 02/14/2014  . Systemic lupus erythematosus ([place]) 02/14/2014  . Hypertension 02/14/2014     1. Breakthrough bleeding on OCPs   2. Cyst of ovary, unspecified laterality   3. Initiation of OCP (BCP)     It is likely that her bleeding is not being helped by taking the Micronor.  This is not a cycle control pill and is in fact not ideal for birth control either.  Her small ovarian cyst is likely a hemorrhagic cyst that will resolve rapidly.   Plan:            1.  Follow-up ultrasound to reevaluate cyst in 6 weeks  2.  Discontinue Micronor and begin combination OCPs for cycle control.  3.  I have spoken to her about IUD-Mirena-for cycle control and birth control as I believe this is her best option.  Her poor experience with her last IUD makes her very hesitant to consider this. Orders No orders of the defined types were placed in this encounter.   No orders of the defined types were placed in this encounter.     F/U  Return in about 3 months (around 03/27/2020). I spent 33 to make ICU minutes involved in the care of this patient preparing to see the patient by obtaining and reviewing her medical history (including labs, imaging tests and prior procedures), documenting clinical information in the electronic health record (EHR), counseling and coordinating care plans, writing and sending prescriptions,  ordering tests or procedures and directly communicating with the patient by discussing pertinent items from her history and physical exam as well as detailing my assessment and plan as noted above so that she has an informed understanding.  All of her questions were answered.  Finis Bud, M.D. 12/29/2019 9:22 AM

## 2019-12-31 ENCOUNTER — Other Ambulatory Visit: Payer: Self-pay | Admitting: Cardiovascular Disease

## 2020-01-13 DIAGNOSIS — M329 Systemic lupus erythematosus, unspecified: Secondary | ICD-10-CM | POA: Diagnosis not present

## 2020-01-13 DIAGNOSIS — Q0700 Arnold-Chiari syndrome without spina bifida or hydrocephalus: Secondary | ICD-10-CM | POA: Diagnosis not present

## 2020-01-13 DIAGNOSIS — L930 Discoid lupus erythematosus: Secondary | ICD-10-CM | POA: Diagnosis not present

## 2020-02-08 ENCOUNTER — Other Ambulatory Visit: Payer: Self-pay

## 2020-02-08 ENCOUNTER — Ambulatory Visit (INDEPENDENT_AMBULATORY_CARE_PROVIDER_SITE_OTHER): Payer: Medicare Other

## 2020-02-08 DIAGNOSIS — D259 Leiomyoma of uterus, unspecified: Secondary | ICD-10-CM

## 2020-02-08 DIAGNOSIS — N920 Excessive and frequent menstruation with regular cycle: Secondary | ICD-10-CM | POA: Diagnosis not present

## 2020-02-08 DIAGNOSIS — N938 Other specified abnormal uterine and vaginal bleeding: Secondary | ICD-10-CM

## 2020-02-09 ENCOUNTER — Ambulatory Visit: Payer: Medicare Other | Admitting: Dermatology

## 2020-02-09 ENCOUNTER — Ambulatory Visit (INDEPENDENT_AMBULATORY_CARE_PROVIDER_SITE_OTHER): Payer: Medicare Other | Admitting: Dermatology

## 2020-02-09 DIAGNOSIS — L930 Discoid lupus erythematosus: Secondary | ICD-10-CM | POA: Diagnosis not present

## 2020-02-09 DIAGNOSIS — L219 Seborrheic dermatitis, unspecified: Secondary | ICD-10-CM | POA: Diagnosis not present

## 2020-02-09 DIAGNOSIS — K130 Diseases of lips: Secondary | ICD-10-CM

## 2020-02-09 DIAGNOSIS — M329 Systemic lupus erythematosus, unspecified: Secondary | ICD-10-CM | POA: Diagnosis not present

## 2020-02-09 MED ORDER — MOMETASONE FUROATE 0.1 % EX OINT: TOPICAL_OINTMENT | Freq: Every day | CUTANEOUS | 6 refills | Status: DC

## 2020-02-09 MED ORDER — FLUOCINONIDE 0.05 % EX GEL: 1.0000 "application " | CUTANEOUS | 1 refills | Status: DC | PRN

## 2020-02-09 MED ORDER — KETOCONAZOLE 2 % EX SHAM: 1.0000 "application " | MEDICATED_SHAMPOO | CUTANEOUS | 11 refills | Status: DC

## 2020-02-09 MED ORDER — MOMETASONE FUROATE 0.1 % EX SOLN: Freq: Every day | CUTANEOUS | 3 refills | Status: DC

## 2020-02-09 NOTE — Patient Instructions (Signed)
Mupirocin ointment for scabs or open sores on face daily as needed  Mometasone ointment / solution is for lupus sores on face and scalp daily as needed  Lidex gel is for ulcer in mouth, may use up to 3 times a day as needed

## 2020-02-09 NOTE — Progress Notes (Signed)
   Follow-Up Visit   Subjective  Caselynn Javaid Lipko is a 39 y.o. female who presents for the following: cutaneous lupus DLE (face/scalp  pt has new spots one in mouth mometasone oint qd face, mometasone sol qd to scalp, mupirocin oint qd to open sores), Seborrheic Dermatitis (scalp - Ketoconazole 2% shampoo q 3 days), and systemic lupus SLE (recent flare, plaquenil 200mg  1 po qd). The patient has long history of systemic lupus followed by Dr. Irving Shows.  She also has been on Plaquenil with generally better control but has had some eye issues that has prevented her from going up on the higher dose of Plaquenil.  She recently developed some mouth ulcers and new skin lesions with discoid lupus cutaneous lesions along with her systemic lupus.  She presents today for evaluation and treatment options for her new facial lesions and her mouth ulcers.  She also complains of irritation around the corners of her mouth and would like to discuss treatment for that.  The following portions of the chart were reviewed this encounter and updated as appropriate:     Review of Systems: No other skin or systemic complaints.  Objective  Well appearing patient in no apparent distress; mood and affect are within normal limits.  A focused examination was performed including face, mouth, scalp. Relevant physical exam findings are noted in the Assessment and Plan.  Objective  corners of mouth: Angular cheilitis  Objective  face, scalp, mucosa of mouth: 0.4cm white based ulcer L lower mucosal lip near oral commissure.  Dyspigmented thickened plaques R cheek, L cheek, R temple  Assessment & Plan  Angular cheilitis corners of mouth  May start Mometasone oint qd up to a week per episode.  Discoid lupus erythematosus face, scalp, mucosa of mouth  With Systemic Lupus SLE  For mouth - cont Orabase qd to twice daily as needed mouth sores Start Lidex gel prn as directed for mouth ulcers  Cont  Plaquenil 200mg   1 po qd as directed by Dr. Precious Reel and pts eye doctor   Cont Mometasone oint qd to aa face prn flares. Cont Mometasone sol qd to aa scalp prn flares. Cont Mupirocin oint qd to open sores.    mometasone (ELOCON) 0.1 % ointment - face, scalp,   fluocinonide gel (LIDEX) 0.05 % -  mucosa of mouth for lupus mouth ulcers as needed  mometasone (ELOCON) 0.1 % lotion -  scalp as needed  Seborrheic dermatitis Scalp  Cont Ketoconazole 2% shampoo q 3 days  ketoconazole (NIZORAL) 2 % shampoo - Scalp Mometasone solution as needed scalp   Return in about 6 months (around 08/10/2020) for Lupus.   I, Othelia Pulling, RMA, am acting as scribe for Sarina Ser, MD .

## 2020-02-11 ENCOUNTER — Other Ambulatory Visit: Payer: Self-pay | Admitting: Dermatology

## 2020-02-15 DIAGNOSIS — Z20822 Contact with and (suspected) exposure to covid-19: Secondary | ICD-10-CM | POA: Diagnosis not present

## 2020-02-21 ENCOUNTER — Other Ambulatory Visit: Payer: Self-pay

## 2020-02-21 DIAGNOSIS — J4520 Mild intermittent asthma, uncomplicated: Secondary | ICD-10-CM

## 2020-02-21 MED ORDER — CETIRIZINE HCL 10 MG PO TABS: 10.0000 mg | ORAL_TABLET | Freq: Every day | ORAL | 5 refills | Status: AC

## 2020-02-21 MED ORDER — PULMICORT FLEXHALER 90 MCG/ACT IN AEPB: 2.0000 | INHALATION_SPRAY | Freq: Two times a day (BID) | RESPIRATORY_TRACT | 1 refills | Status: DC

## 2020-02-22 ENCOUNTER — Telehealth: Payer: Self-pay

## 2020-02-22 NOTE — Telephone Encounter (Signed)
Confirmed and screened for 02-24-20 ov. 

## 2020-02-24 ENCOUNTER — Encounter: Payer: Self-pay | Admitting: Nurse Practitioner

## 2020-02-24 ENCOUNTER — Other Ambulatory Visit: Payer: Self-pay

## 2020-02-24 ENCOUNTER — Ambulatory Visit (INDEPENDENT_AMBULATORY_CARE_PROVIDER_SITE_OTHER): Payer: Medicare Other | Admitting: Nurse Practitioner

## 2020-02-24 VITALS — BP 139/87 | HR 100 | Temp 97.4°F | Resp 16 | Ht 66.0 in | Wt 234.2 lb

## 2020-02-24 DIAGNOSIS — J4520 Mild intermittent asthma, uncomplicated: Secondary | ICD-10-CM | POA: Diagnosis not present

## 2020-02-24 DIAGNOSIS — N920 Excessive and frequent menstruation with regular cycle: Secondary | ICD-10-CM | POA: Diagnosis not present

## 2020-02-24 DIAGNOSIS — M329 Systemic lupus erythematosus, unspecified: Secondary | ICD-10-CM | POA: Diagnosis not present

## 2020-02-24 DIAGNOSIS — M064 Inflammatory polyarthropathy: Secondary | ICD-10-CM

## 2020-02-24 DIAGNOSIS — I10 Essential (primary) hypertension: Secondary | ICD-10-CM | POA: Diagnosis not present

## 2020-02-24 NOTE — Progress Notes (Signed)
[facility] [place], [place] [address]  Internal MEDICINE  Office Visit Note  Patient Name: Morgan Kent  [id number]  [id number]  Date of Service: 03/05/2020  Chief Complaint  Patient presents with  . Hypertension  . Arthritis  . Anxiety  . Depression  . Weight Loss    would like to know when she can be put on weight loss medication     The patient is here for routine follow up. She has seen GYN due to ovarian cyst which is likely hemorrhagic. Believe that it will resolve rapidly. Contraceptive pills were changed to combination pill due to heavy and frequent menstrual cycles. She has not noted any difference at this point. She had repeat ultrasound two weeks ago and will follow up in May with GYN.  She recently saw her rheumatologist. Labs indicated she was in lupus flare. Wanted to increase her plaquenil to two tablets daily. She is reluctant to do this because she was told that this might worsen her glaucoma.       Current Medication: Outpatient Encounter Medications as of 02/24/2020  Medication Sig  . adapalene (DIFFERIN) 0.1 % gel Apply 1 application topically at bedtime. Apply to forehead and chin  . albuterol (VENTOLIN HFA) 108 (90 Base) MCG/ACT inhaler Inhale 2 puffs into the lungs daily.  . Budesonide (PULMICORT FLEXHALER) 90 MCG/ACT inhaler Inhale 2 puffs into the lungs 2 (two) times daily.  . carvedilol (COREG) 25 MG tablet TAKE 1 TABLET BY MOUTH TWICE A DAY  . cetirizine (ZYRTEC) 10 MG tablet Take 1 tablet (10 mg total) by mouth daily.  . clobetasol (TEMOVATE) 0.05 % external solution Apply 1 application topically 2 (two) times daily as needed. Apply to scalp  . cyclobenzaprine (FLEXERIL) 10 MG tablet Take 10 mg by mouth 3 (three) times daily as needed.  . diclofenac sodium (VOLTAREN) 1 % GEL Apply 1 application topically 2 (two) times daily as needed (for pain).  Marland Kitchen doxycycline (VIBRA-TABS) 100 MG tablet Take 100 mg by mouth 2 (two) times  daily.  . ergocalciferol (DRISDOL) 1.25 MG (50000 UT) capsule Take 1 capsule (50,000 Units total) by mouth once a week.  . ferrous sulfate (FERROUSUL) 325 (65 FE) MG tablet Take 1 tablet (325 mg total) by mouth daily with breakfast.  . fluocinonide gel (LIDEX) AB-123456789 % Apply 1 application topically as needed. Apply to aa ulcer as directed  . fluticasone (FLONASE) 50 MCG/ACT nasal spray Place 1 spray into both nostrils at bedtime.  . gabapentin (NEURONTIN) 300 MG capsule Take 1 capsule (300 mg total) by mouth 3 (three) times daily.  . hydrochlorothiazide (HYDRODIURIL) 12.5 MG tablet Take 1 tablet (12.5 mg total) by mouth daily.  . hydrochlorothiazide (HYDRODIURIL) 12.5 MG tablet Take 1 tablet (12.5 mg total) by mouth daily.  . hydroxychloroquine (PLAQUENIL) 200 MG tablet Take 200 mg by mouth daily.   Marland Kitchen ketoconazole (NIZORAL) 2 % shampoo Apply 1 application topically every 3 (three) days. Let sit 5 minutes before rinsing out  . lisinopril (ZESTRIL) 5 MG tablet Take 1 tablet (5 mg total) by mouth daily.  . meloxicam (MOBIC) 7.5 MG tablet Take 1 tablet (7.5 mg total) by mouth 2 (two) times daily.  . mometasone (ELOCON) 0.1 % lotion Apply topically daily. Qd to aa lupus in scalp prn flares  . mometasone (ELOCON) 0.1 % ointment Apply topically daily.  . mometasone (ELOCON) 0.1 % ointment Apply topically daily. Qd prn flares to lupus sores on the face  .  mometasone-formoterol (DULERA) 100-5 MCG/ACT AERO Inhale 2 puffs into the lungs 2 (two) times daily.  . montelukast (SINGULAIR) 10 MG tablet Take 1 tablet (10 mg total) by mouth at bedtime.  . mupirocin ointment (BACTROBAN) 2 % APPLY A SMALL AMOUNT TOPICALLY TO OPEN SORES ON FACE TWICE DAILY UNTIL RESOLVED  . norethindrone (MICRONOR) 0.35 MG tablet Take 1 tablet (0.35 mg total) by mouth daily.  Marland Kitchen oxyCODONE-acetaminophen (PERCOCET) 7.5-325 MG tablet Take 1 tablet by mouth every 4 (four) hours as needed for severe pain.  Vladimir Faster Glycol-Propyl Glycol  (SYSTANE ULTRA) 0.4-0.3 % SOLN Place 1 drop into both eyes daily as needed (for dry eyes).   . tacrolimus (PROTOPIC) 0.1 % ointment Apply 1 application topically daily.  Marland Kitchen tretinoin (RETIN-A) 0.05 % cream Apply 1 application topically at bedtime.   . triamcinolone cream (KENALOG) 0.1 % Apply 1 application topically 2 (two) times daily as needed (outbreaks).   . norethindrone-ethinyl estradiol-iron (LOESTRIN FE 1.5/30) 1.5-30 MG-MCG tablet Take 1 tablet by mouth at bedtime for 28 days.   No facility-administered encounter medications on file as of 02/24/2020.    Surgical History: Past Surgical History:  Procedure Laterality Date  . carpel tunnel  Left 2014  . CRANIOTOMY  14,06   chiari  . DENTAL SURGERY    . HERNIA REPAIR     umbilical  . PLACEMENT OF LUMBAR DRAIN N/A 07/07/2014   Procedure: PLACEMENT OF LUMBAR DRAIN AND CLOSURE OF CERVICAL INCISION;  Surgeon: Newman Pies, MD;  Location: [place] [facility];  Service: Neurosurgery;  Laterality: N/A;  . SUBOCCIPITAL CRANIECTOMY CERVICAL LAMINECTOMY N/A 09/29/2013   Procedure: SUBOCCIPITAL CRANIECTOMY CERVICAL LAMINECTOMY/DURAPLASTY;  Surgeon: Ophelia Charter, MD;  Location: [place] [facility];  Service: Neurosurgery;  Laterality: N/A;  posterior  . SUBOCCIPITAL CRANIECTOMY CERVICAL LAMINECTOMY N/A 06/23/2014   Procedure: SUBOCCIPITAL CRANIECTOMY CERVICAL LAMINECTOMY/DURAPLASTY;  Surgeon: Newman Pies, MD;  Location: [place] [facility];  Service: Neurosurgery;  Laterality: N/A;  suboccipital craniectomy with cervical laminectomy and duraplasty    Medical History: Past Medical History:  Diagnosis Date  . Anxiety   . Arthritis    rheumo possible  . Chiari malformation   . Depression   . Dizziness   . Headache(784.0)   . History of kidney stones   . Hypertension   . Lupus ([place])   . Lupus ([place])   . Nerve damage    NECK  . PONV (postoperative nausea and vomiting)    nausea only  . TMJ arthritis     Family History: Family History  Problem  Relation Age of Onset  . Heart disease Father   . Hypertension Father   . Hypertension Mother   . Thyroid disease Mother   . Asthma Other   . Depression Other   . Diabetes Other   . Migraines Other   . Stroke Other     Social History   Socioeconomic History  . Marital status: Single    Spouse name: Not on file  . Number of children: Not on file  . Years of education: Not on file  . Highest education level: Not on file  Occupational History  . Not on file  Tobacco Use  . Smoking status: Never Smoker  . Smokeless tobacco: Never Used  Substance and Sexual Activity  . Alcohol use: No    Alcohol/week: 0.0 standard drinks  . Drug use: No  . Sexual activity: Not on file  Other Topics Concern  . Not on file  Social History Narrative  Lives with 3 children in 2 story home.  Unemployed.  Trying to get disability.  Education: high school.   Social Determinants of Health   Financial Resource Strain:   . Difficulty of Paying Living Expenses:   Food Insecurity:   . Worried About Charity fundraiser in the Last Year:   . Arboriculturist in the Last Year:   Transportation Needs:   . Film/video editor (Medical):   Marland Kitchen Lack of Transportation (Non-Medical):   Physical Activity:   . Days of Exercise per Week:   . Minutes of Exercise per Session:   Stress:   . Feeling of Stress :   Social Connections:   . Frequency of Communication with Friends and Family:   . Frequency of Social Gatherings with Friends and Family:   . Attends Religious Services:   . Active Member of Clubs or Organizations:   . Attends Archivist Meetings:   Marland Kitchen Marital Status:   Intimate Partner Violence:   . Fear of Current or Ex-Partner:   . Emotionally Abused:   Marland Kitchen Physically Abused:   . Sexually Abused:       Review of Systems  Constitutional: Positive for fatigue. Negative for activity change, chills and unexpected weight change.  HENT: Negative for congestion, ear pain, postnasal drip,  rhinorrhea, sinus pressure, sinus pain, sneezing and sore throat.   Respiratory: Negative for cough, chest tightness, shortness of breath and wheezing.   Cardiovascular: Negative for chest pain and palpitations.  Gastrointestinal: Negative for abdominal pain, constipation, diarrhea, nausea and vomiting.  Endocrine: Negative for cold intolerance, heat intolerance, polydipsia and polyuria.  Genitourinary: Positive for menstrual problem.       Heavy menstrual cycles with lots of clotting. Has seen GYN provider since her last visit and has started on new OCP   Musculoskeletal: Positive for back pain and myalgias. Negative for arthralgias, joint swelling and neck pain.       Sees pain management provider   Skin: Negative for rash.       Skin changes related to lupus.   Allergic/Immunologic: Positive for environmental allergies.  Neurological: Positive for numbness and headaches. Negative for tremors and weakness.       Has noted numbness and burning to the tips of her fingers. Has also noted blue discoloration and cold feeling to the fingertips.   Hematological: Negative for adenopathy. Does not bruise/bleed easily.  Psychiatric/Behavioral: Negative for behavioral problems (Depression), sleep disturbance and suicidal ideas. The patient is nervous/anxious.     Today's Vitals   02/24/20 1559  BP: 139/87  Pulse: 100  Resp: 16  Temp: (!) 97.4 F (36.3 C)  SpO2: 96%  Weight: 234 lb 3.2 oz (106.2 kg)  Height: 5\' 6"  (1.676 m)   Body mass index is 37.8 kg/m.  Physical Exam Vitals and nursing note reviewed.  Constitutional:      General: She is not in acute distress.    Appearance: Normal appearance. She is well-developed. She is obese. She is not diaphoretic.  HENT:     Head: Normocephalic and atraumatic.     Nose: Nose normal.     Mouth/Throat:     Pharynx: No oropharyngeal exudate.  Eyes:     Pupils: Pupils are equal, round, and reactive to light.  Neck:     Thyroid: No thyromegaly.      Vascular: No carotid bruit or JVD.     Trachea: No tracheal deviation.  Cardiovascular:     Rate  and Rhythm: Normal rate and regular rhythm.     Heart sounds: Normal heart sounds. No murmur. No friction rub. No gallop.   Pulmonary:     Effort: Pulmonary effort is normal. No respiratory distress.     Breath sounds: Normal breath sounds. No wheezing or rales.  Chest:     Chest wall: No tenderness.  Abdominal:     Palpations: Abdomen is soft.  Musculoskeletal:        General: Normal range of motion.     Cervical back: Normal range of motion and neck supple.     Comments: Generalized joint pain without point tenderness present.   Lymphadenopathy:     Cervical: No cervical adenopathy.  Skin:    General: Skin is warm and dry.     Comments: Psoriatic type plaques present on the limbs and face.   Neurological:     Mental Status: She is alert and oriented to person, place, and time.     Cranial Nerves: No cranial nerve deficit.  Psychiatric:        Mood and Affect: Mood normal.        Behavior: Behavior normal.        Thought Content: Thought content normal.        Judgment: Judgment normal.   Assessment/Plan: 1. Menorrhagia with regular cycle Patient now seeing GYN provider. Contraceptive pill changed to combination pill. She should continue to see new provider as scheduled.   2. Mild intermittent asthma without complication Stable. Continue inhalers and respiratory medication as prescribed.   3. Systemic lupus erythematosus, unspecified SLE type, unspecified organ involvement status ([place]) Continue regular visits with rheumatology as scheduled.   4. Essential hypertension Stable. Continue bp medication as prescribed   5. Inflammatory polyarthritis ([place]) Continue regular visits with pain management provider as scheduled.   General Counseling: Morgan Kent verbalizes understanding of the findings of todays visit and agrees with plan of treatment. I have discussed any further  diagnostic evaluation that may be needed or ordered today. We also reviewed her medications today. she has been encouraged to call the office with any questions or concerns that should arise related to todays visit.  This patient was seen by Leretha Pol FNP Collaboration with Dr Lavera Guise as a part of collaborative care agreement  Total time spent: 25 Minutes   Time spent includes review of chart, medications, test results, and follow up plan with the patient.      Dr Lavera Guise Internal medicine

## 2020-03-14 ENCOUNTER — Other Ambulatory Visit: Payer: Self-pay | Admitting: Obstetrics and Gynecology

## 2020-03-14 DIAGNOSIS — Z30011 Encounter for initial prescription of contraceptive pills: Secondary | ICD-10-CM

## 2020-03-14 DIAGNOSIS — N921 Excessive and frequent menstruation with irregular cycle: Secondary | ICD-10-CM

## 2020-03-14 DIAGNOSIS — Z03818 Encounter for observation for suspected exposure to other biological agents ruled out: Secondary | ICD-10-CM | POA: Diagnosis not present

## 2020-03-17 DIAGNOSIS — G545 Neuralgic amyotrophy: Secondary | ICD-10-CM | POA: Diagnosis not present

## 2020-03-17 DIAGNOSIS — M79661 Pain in right lower leg: Secondary | ICD-10-CM | POA: Diagnosis not present

## 2020-03-17 DIAGNOSIS — G629 Polyneuropathy, unspecified: Secondary | ICD-10-CM | POA: Diagnosis not present

## 2020-03-17 DIAGNOSIS — G894 Chronic pain syndrome: Secondary | ICD-10-CM | POA: Diagnosis not present

## 2020-03-17 DIAGNOSIS — M542 Cervicalgia: Secondary | ICD-10-CM | POA: Diagnosis not present

## 2020-03-17 DIAGNOSIS — G909 Disorder of the autonomic nervous system, unspecified: Secondary | ICD-10-CM | POA: Diagnosis not present

## 2020-03-17 DIAGNOSIS — M25511 Pain in right shoulder: Secondary | ICD-10-CM | POA: Diagnosis not present

## 2020-03-17 DIAGNOSIS — I10 Essential (primary) hypertension: Secondary | ICD-10-CM | POA: Diagnosis not present

## 2020-03-17 DIAGNOSIS — M545 Low back pain: Secondary | ICD-10-CM | POA: Diagnosis not present

## 2020-03-17 DIAGNOSIS — M25512 Pain in left shoulder: Secondary | ICD-10-CM | POA: Diagnosis not present

## 2020-03-17 DIAGNOSIS — M5412 Radiculopathy, cervical region: Secondary | ICD-10-CM | POA: Diagnosis not present

## 2020-03-17 DIAGNOSIS — M79662 Pain in left lower leg: Secondary | ICD-10-CM | POA: Diagnosis not present

## 2020-03-20 ENCOUNTER — Other Ambulatory Visit: Payer: Self-pay | Admitting: Dermatology

## 2020-03-24 DIAGNOSIS — Z6837 Body mass index (BMI) 37.0-37.9, adult: Secondary | ICD-10-CM | POA: Diagnosis not present

## 2020-03-24 DIAGNOSIS — I10 Essential (primary) hypertension: Secondary | ICD-10-CM | POA: Diagnosis not present

## 2020-03-24 DIAGNOSIS — M542 Cervicalgia: Secondary | ICD-10-CM | POA: Diagnosis not present

## 2020-03-27 ENCOUNTER — Other Ambulatory Visit: Payer: Self-pay

## 2020-03-27 DIAGNOSIS — M064 Inflammatory polyarthropathy: Secondary | ICD-10-CM

## 2020-03-27 MED ORDER — MELOXICAM 7.5 MG PO TABS: 7.5000 mg | ORAL_TABLET | Freq: Two times a day (BID) | ORAL | 1 refills | Status: DC

## 2020-03-28 ENCOUNTER — Other Ambulatory Visit: Payer: Self-pay

## 2020-03-28 ENCOUNTER — Ambulatory Visit (INDEPENDENT_AMBULATORY_CARE_PROVIDER_SITE_OTHER): Payer: Medicare Other | Admitting: Obstetrics and Gynecology

## 2020-03-28 ENCOUNTER — Encounter: Payer: Self-pay | Admitting: Obstetrics and Gynecology

## 2020-03-28 VITALS — BP 134/87 | HR 103 | Ht 66.0 in | Wt 236.7 lb

## 2020-03-28 DIAGNOSIS — N83209 Unspecified ovarian cyst, unspecified side: Secondary | ICD-10-CM

## 2020-03-28 DIAGNOSIS — N920 Excessive and frequent menstruation with regular cycle: Secondary | ICD-10-CM

## 2020-03-28 DIAGNOSIS — N83201 Unspecified ovarian cyst, right side: Secondary | ICD-10-CM

## 2020-03-28 DIAGNOSIS — D219 Benign neoplasm of connective and other soft tissue, unspecified: Secondary | ICD-10-CM

## 2020-03-28 NOTE — Progress Notes (Signed)
HPI:      [name] is a 39 y.o. No obstetric history on file. who LMP was Patient's last menstrual period was 03/17/2020.  Subjective:   She presents today for follow-up of ovarian cyst, initiation of OCPs to control menorrhagia.  Patient had a hemorrhagic cyst and was having very heavy menstrual bleeding with clots.  She began OCPs to decrease her menstrual bleeding and prevent future cysts. Her initial ultrasound showed no evidence of uterine fibroids. Patient is now taking OCPs regularly and her cycles have become much lighter without clotting.  She is happy on OCPs.    Hx: The following portions of the patient's history were reviewed and updated as appropriate:             She  has a past medical history of Anxiety, Arthritis, Chiari malformation, Depression, Dizziness, Headache(784.0), History of kidney stones, Hypertension, Lupus ([place]), Lupus ([place]), Nerve damage, PONV (postoperative nausea and vomiting), and TMJ arthritis. She does not have any pertinent problems on file. She  has a past surgical history that includes Craniotomy (14,06); carpel tunnel  (Left, 2014); Dental surgery; Hernia repair; Suboccipital craniectomy cervical laminectomy (N/A, 09/29/2013); Suboccipital craniectomy cervical laminectomy (N/A, 06/23/2014); and Placement of lumbar drain (N/A, 07/07/2014). Her family history includes Asthma in an other family member; Depression in an other family member; Diabetes in an other family member; Heart disease in her father; Hypertension in her father and mother; Migraines in an other family member; Stroke in an other family member; Thyroid disease in her mother. She  reports that she has never smoked. She has never used smokeless tobacco. She reports that she does not drink alcohol or use drugs. She has a current medication list which includes the following prescription(s): adapalene, albuterol, aurovela fe 1.5/30, pulmicort flexhaler, carvedilol, cetirizine, clobetasol,  cyclobenzaprine, diclofenac sodium, ergocalciferol, ferrous sulfate, fluocinonide gel, fluticasone, gabapentin, hydrochlorothiazide, hydrochlorothiazide, hydroxychloroquine, ketoconazole, lisinopril, meloxicam, mometasone, mometasone, mometasone, mometasone-formoterol, montelukast, mupirocin ointment, norethindrone, oxycodone-acetaminophen, systane ultra, tacrolimus, tretinoin, triamcinolone cream, and doxycycline. She is allergic to other; sulfa antibiotics; and cephalexin.       Review of Systems:  Review of Systems  Constitutional: Denied constitutional symptoms, night sweats, recent illness, fatigue, fever, insomnia and weight loss.  Eyes: Denied eye symptoms, eye pain, photophobia, vision change and visual disturbance.  Ears/Nose/Throat/Neck: Denied ear, nose, throat or neck symptoms, hearing loss, nasal discharge, sinus congestion and sore throat.  Cardiovascular: Denied cardiovascular symptoms, arrhythmia, chest pain/pressure, edema, exercise intolerance, orthopnea and palpitations.  Respiratory: Denied pulmonary symptoms, asthma, pleuritic pain, productive sputum, cough, dyspnea and wheezing.  Gastrointestinal: Denied, gastro-esophageal reflux, melena, nausea and vomiting.  Genitourinary: Denied genitourinary symptoms including symptomatic vaginal discharge, pelvic relaxation issues, and urinary complaints.  Musculoskeletal: Denied musculoskeletal symptoms, stiffness, swelling, muscle weakness and myalgia.  Dermatologic: Denied dermatology symptoms, rash and scar.  Neurologic: Denied neurology symptoms, dizziness, headache, neck pain and syncope.  Psychiatric: Denied psychiatric symptoms, anxiety and depression.  Endocrine: Denied endocrine symptoms including hot flashes and night sweats.   Meds:   Current Outpatient Medications on File Prior to Visit  Medication Sig Dispense Refill  . adapalene (DIFFERIN) 0.1 % gel Apply 1 application topically at bedtime. Apply to forehead and chin     . albuterol (VENTOLIN HFA) 108 (90 Base) MCG/ACT inhaler Inhale 2 puffs into the lungs daily. 18 g 5  . AUROVELA FE 1.5/30 1.5-30 MG-MCG tablet TAKE 1 TABLET BY MOUTH AT BEDTIME 28 tablet 3  . Budesonide (PULMICORT FLEXHALER) 90 MCG/ACT inhaler Inhale  2 puffs into the lungs 2 (two) times daily. 3 each 1  . carvedilol (COREG) 25 MG tablet TAKE 1 TABLET BY MOUTH TWICE A DAY 180 tablet 3  . cetirizine (ZYRTEC) 10 MG tablet Take 1 tablet (10 mg total) by mouth daily. 30 tablet 5  . clobetasol (TEMOVATE) 0.05 % external solution Apply 1 application topically 2 (two) times daily as needed. Apply to scalp    . cyclobenzaprine (FLEXERIL) 10 MG tablet Take 10 mg by mouth 3 (three) times daily as needed.    . diclofenac sodium (VOLTAREN) 1 % GEL Apply 1 application topically 2 (two) times daily as needed (for pain). 300 g 3  . ergocalciferol (DRISDOL) 1.25 MG (50000 UT) capsule Take 1 capsule (50,000 Units total) by mouth once a week. 4 capsule 5  . ferrous sulfate (FERROUSUL) 325 (65 FE) MG tablet Take 1 tablet (325 mg total) by mouth daily with breakfast. 90 tablet 1  . fluocinonide gel (LIDEX) AB-123456789 % Apply 1 application topically as needed. Apply to aa ulcer as directed 60 g 1  . fluticasone (FLONASE) 50 MCG/ACT nasal spray Place 1 spray into both nostrils at bedtime. 48 g 3  . gabapentin (NEURONTIN) 300 MG capsule Take 1 capsule (300 mg total) by mouth 3 (three) times daily. 270 capsule 1  . hydrochlorothiazide (HYDRODIURIL) 12.5 MG tablet Take 1 tablet (12.5 mg total) by mouth daily. 90 tablet 3  . hydrochlorothiazide (HYDRODIURIL) 12.5 MG tablet Take 1 tablet (12.5 mg total) by mouth daily. 90 tablet 2  . hydroxychloroquine (PLAQUENIL) 200 MG tablet Take 200 mg by mouth daily.     Marland Kitchen ketoconazole (NIZORAL) 2 % shampoo Apply 1 application topically every 3 (three) days. Let sit 5 minutes before rinsing out 120 mL 11  . lisinopril (ZESTRIL) 5 MG tablet Take 1 tablet (5 mg total) by mouth daily. 90 tablet  3  . meloxicam (MOBIC) 7.5 MG tablet Take 1 tablet (7.5 mg total) by mouth 2 (two) times daily. 180 tablet 1  . mometasone (ELOCON) 0.1 % lotion Apply topically daily. Qd to aa lupus in scalp prn flares 60 mL 3  . mometasone (ELOCON) 0.1 % ointment Apply topically daily.    . mometasone (ELOCON) 0.1 % ointment Apply topically daily. Qd prn flares to lupus sores on the face 45 g 6  . mometasone-formoterol (DULERA) 100-5 MCG/ACT AERO Inhale 2 puffs into the lungs 2 (two) times daily.    . montelukast (SINGULAIR) 10 MG tablet Take 1 tablet (10 mg total) by mouth at bedtime. 90 tablet 3  . mupirocin ointment (BACTROBAN) 2 % APPLY A SMALL AMOUNT TOPICALLY TO OPEN SORES ON FACE TWICE DAILY UNTIL RESOLVED 22 g 0  . norethindrone (MICRONOR) 0.35 MG tablet Take 1 tablet (0.35 mg total) by mouth daily. 3 Package 3  . oxyCODONE-acetaminophen (PERCOCET) 7.5-325 MG tablet Take 1 tablet by mouth every 4 (four) hours as needed for severe pain.    Vladimir Faster Glycol-Propyl Glycol (SYSTANE ULTRA) 0.4-0.3 % SOLN Place 1 drop into both eyes daily as needed (for dry eyes).     . tacrolimus (PROTOPIC) 0.1 % ointment Apply 1 application topically daily.    Marland Kitchen tretinoin (RETIN-A) 0.05 % cream Apply 1 application topically at bedtime.   0  . triamcinolone cream (KENALOG) 0.1 % Apply 1 application topically 2 (two) times daily as needed (outbreaks).   0  . doxycycline (VIBRA-TABS) 100 MG tablet Take 100 mg by mouth 2 (two) times daily.  No current facility-administered medications on file prior to visit.    Objective:     Vitals:   03/28/20 0855  BP: 134/87  Pulse: (!) 103              Review of the ultrasound with the patient.   This ultrasound reveals evidence of uterine fibroids.  Shows resolution of ovarian cyst.  Assessment:    No obstetric history on file. Patient Active Problem List   Diagnosis Date Noted  . Hemorrhagic cyst of right ovary 11/26/2019  . Menorrhagia with regular cycle 11/07/2019   . Iron deficiency anemia 11/07/2019  . Inflammatory polyarthritis ([place]) 11/07/2019  . Vitamin B12 deficiency 08/03/2019  . Raynaud's disease without gangrene 08/03/2019  . Mild intermittent asthma without complication 0000000  . BMI 39.0-39.9,adult 06/19/2019  . Sore throat 12/23/2018  . Flu-like symptoms 12/23/2018  . Arnold-Chiari syndrome ([place]) 12/08/2018  . Candidiasis 12/08/2018  . Body mass index 40.0-44.9, adult ([place]) 12/08/2018  . Impaired fasting glucose 09/07/2018  . Other fatigue 09/07/2018  . Vitamin D deficiency 09/07/2018  . Encounter for annual general medical examination with abnormal findings in adult 05/28/2018  . Screening for malignant neoplasm of cervix 05/28/2018  . Dysuria 05/28/2018  . Allergic rhinitis 04/16/2018  . Acute recurrent sinusitis 03/20/2018  . Tachycardia 08/20/2016  . Shortness of breath 08/20/2016  . Congestive dilated cardiomyopathy ([place]) 11/22/2015  . Lupus ([place]) 11/22/2015  . Moderate obesity 11/22/2015  . Obstructive sleep apnea on CPAP 11/22/2015  . Essential hypertension 11/22/2015  . Chronic systolic CHF (congestive heart failure) ([place]) 08/21/2015  . Numbness and tingling 08/04/2014  . Chiari malformation 08/04/2014  . Sleep disorder 08/04/2014  . Postprocedural pseudomeningocele 07/07/2014  . Chiari malformation type I ([place]) 06/23/2014  . Carpal tunnel syndrome 02/14/2014  . Depression 02/14/2014  . Neuralgia 02/14/2014  . Migraines 02/14/2014  . Sleep apnea 02/14/2014  . Systemic lupus erythematosus ([place]) 02/14/2014  . Hypertension 02/14/2014     1. Cyst of ovary, unspecified laterality   2. Menorrhagia with regular cycle   3. Fibroids     Patient now with good cycle control on OCPs and a decrease in clotting with menses.   Plan:            1.  Continue OCPs.  If cycles become heavy again with clots consider hormone containing IUD. Orders No orders of the defined types were placed in this encounter.   No  orders of the defined types were placed in this encounter.     F/U  Return for Annual Physical, Pt to contact us if symptoms worsen. I spent 23 minutes involved in the care of this patient preparing to see the patient by obtaining and reviewing her medical history (including labs, imaging tests and prior procedures), documenting clinical information in the electronic health record (EHR), counseling and coordinating care plans, writing and sending prescriptions, ordering tests or procedures and directly communicating with the patient by discussing pertinent items from her history and physical exam as well as detailing my assessment and plan as noted above so that she has an informed understanding.  All of her questions were answered.  Finis Bud, M.D. 03/28/2020 9:32 AM

## 2020-03-28 NOTE — Progress Notes (Signed)
Patient comes in today for follow up on BC pills and cyst. No other concerns today.

## 2020-04-11 ENCOUNTER — Other Ambulatory Visit: Payer: Self-pay

## 2020-04-11 DIAGNOSIS — M792 Neuralgia and neuritis, unspecified: Secondary | ICD-10-CM

## 2020-04-11 DIAGNOSIS — E559 Vitamin D deficiency, unspecified: Secondary | ICD-10-CM

## 2020-04-11 MED ORDER — GABAPENTIN 300 MG PO CAPS: 300.0000 mg | ORAL_CAPSULE | Freq: Three times a day (TID) | ORAL | 1 refills | Status: DC

## 2020-04-11 MED ORDER — ERGOCALCIFEROL 1.25 MG (50000 UT) PO CAPS: 50000.0000 [IU] | ORAL_CAPSULE | ORAL | 5 refills | Status: DC

## 2020-04-14 DIAGNOSIS — Z20822 Contact with and (suspected) exposure to covid-19: Secondary | ICD-10-CM | POA: Diagnosis not present

## 2020-04-18 DIAGNOSIS — M25512 Pain in left shoulder: Secondary | ICD-10-CM | POA: Diagnosis not present

## 2020-04-18 DIAGNOSIS — M5412 Radiculopathy, cervical region: Secondary | ICD-10-CM | POA: Diagnosis not present

## 2020-04-18 DIAGNOSIS — M79661 Pain in right lower leg: Secondary | ICD-10-CM | POA: Diagnosis not present

## 2020-04-18 DIAGNOSIS — M542 Cervicalgia: Secondary | ICD-10-CM | POA: Diagnosis not present

## 2020-04-18 DIAGNOSIS — M25511 Pain in right shoulder: Secondary | ICD-10-CM | POA: Diagnosis not present

## 2020-04-18 DIAGNOSIS — M545 Low back pain: Secondary | ICD-10-CM | POA: Diagnosis not present

## 2020-04-18 DIAGNOSIS — M79662 Pain in left lower leg: Secondary | ICD-10-CM | POA: Diagnosis not present

## 2020-04-18 DIAGNOSIS — G629 Polyneuropathy, unspecified: Secondary | ICD-10-CM | POA: Diagnosis not present

## 2020-04-18 DIAGNOSIS — G909 Disorder of the autonomic nervous system, unspecified: Secondary | ICD-10-CM | POA: Diagnosis not present

## 2020-04-18 DIAGNOSIS — G545 Neuralgic amyotrophy: Secondary | ICD-10-CM | POA: Diagnosis not present

## 2020-04-18 DIAGNOSIS — G894 Chronic pain syndrome: Secondary | ICD-10-CM | POA: Diagnosis not present

## 2020-04-18 DIAGNOSIS — Z713 Dietary counseling and surveillance: Secondary | ICD-10-CM | POA: Diagnosis not present

## 2020-04-27 ENCOUNTER — Other Ambulatory Visit: Payer: Self-pay

## 2020-04-27 DIAGNOSIS — D509 Iron deficiency anemia, unspecified: Secondary | ICD-10-CM

## 2020-04-27 MED ORDER — FERROUS SULFATE 325 (65 FE) MG PO TABS: 325.0000 mg | ORAL_TABLET | Freq: Every day | ORAL | 1 refills | Status: DC

## 2020-05-01 ENCOUNTER — Other Ambulatory Visit: Payer: Self-pay

## 2020-05-01 DIAGNOSIS — D509 Iron deficiency anemia, unspecified: Secondary | ICD-10-CM

## 2020-05-01 DIAGNOSIS — M329 Systemic lupus erythematosus, unspecified: Secondary | ICD-10-CM | POA: Diagnosis not present

## 2020-05-01 DIAGNOSIS — Z79899 Other long term (current) drug therapy: Secondary | ICD-10-CM | POA: Diagnosis not present

## 2020-05-01 DIAGNOSIS — E559 Vitamin D deficiency, unspecified: Secondary | ICD-10-CM

## 2020-05-01 DIAGNOSIS — H40003 Preglaucoma, unspecified, bilateral: Secondary | ICD-10-CM | POA: Diagnosis not present

## 2020-05-01 MED ORDER — FERROUS SULFATE 325 (65 FE) MG PO TABS: 325.0000 mg | ORAL_TABLET | Freq: Every day | ORAL | 0 refills | Status: DC

## 2020-05-10 ENCOUNTER — Other Ambulatory Visit: Payer: Self-pay

## 2020-05-10 ENCOUNTER — Ambulatory Visit (INDEPENDENT_AMBULATORY_CARE_PROVIDER_SITE_OTHER): Payer: Medicare Other | Admitting: Dermatology

## 2020-05-10 DIAGNOSIS — L708 Other acne: Secondary | ICD-10-CM

## 2020-05-10 DIAGNOSIS — M3219 Other organ or system involvement in systemic lupus erythematosus: Secondary | ICD-10-CM | POA: Diagnosis not present

## 2020-05-10 DIAGNOSIS — L930 Discoid lupus erythematosus: Secondary | ICD-10-CM

## 2020-05-10 MED ORDER — MOMETASONE FUROATE 0.1 % EX OINT: TOPICAL_OINTMENT | Freq: Every day | CUTANEOUS | 6 refills | Status: DC

## 2020-05-10 MED ORDER — MUPIROCIN 2 % EX OINT: TOPICAL_OINTMENT | CUTANEOUS | 5 refills | Status: DC

## 2020-05-10 MED ORDER — MOMETASONE FUROATE 0.1 % EX SOLN: Freq: Every day | CUTANEOUS | 3 refills | Status: DC

## 2020-05-10 MED ORDER — KETOCONAZOLE 2 % EX SHAM: 1.0000 "application " | MEDICATED_SHAMPOO | Freq: Once | CUTANEOUS | 5 refills | Status: AC

## 2020-05-10 NOTE — Progress Notes (Signed)
   Follow-Up Visit   Subjective  Morgan Kent is a 39 y.o. female who presents for the following: discoid lupus (3 months f/u discoid lupus on her face, scalp, pt using Mometasone ointment, Ketoconazole shampoo, Mometasone lotion with a good response). Systemic Lupus controlled by Dr Cristi Loron, pt taking Plaquenil tablets.  The following portions of the chart were reviewed this encounter and updated as appropriate:  Tobacco  Allergies  Meds  Problems  Med Hx  Surg Hx  Fam Hx     Review of Systems:  No other skin or systemic complaints except as noted in HPI or Assessment and Plan.  Objective  Well appearing patient in no apparent distress; mood and affect are within normal limits.  A focused examination was performed including face,scalp, arms . Relevant physical exam findings are noted in the Assessment and Plan.  Objective  Right Upper Arm - Anterior: Spotty hyperpigmentation cheeks, 3 active pink papules  Objective  Head - Anterior (Face): Spotty hyperpigmentation   Objective  Head - Anterior (Face): Erythematous papules and pustules with comedones    Assessment & Plan    Discoid lupus erythematosus Face; scalp  Cont Mometasone ointment apply to face qd-bid prn Cont Ketoconazole 2% shampoo apply to scalp  Cont Mupirocin ointment apply to open areas qd-bid  Cont Mometasone lotion apply to scalp qd-bid prn   Reordered Medications mupirocin ointment (BACTROBAN) 2 % mometasone (ELOCON) 0.1 % ointment mometasone (ELOCON) 0.1 % lotion  Systemic lupus erythematosus with other organ involvement, unspecified SLE type  Head - Anterior (Face)  Controlled by Dr Jefm Bryant pt taking Plaquenil tablets.  Pt eye doctor says OK to  increase plaquenil tablets, pt will get approval from Dr Jefm Bryant to increase Plaquenil  Advised increasing Plaquenil will also help Discoid Lupus lesions.  Other acne Head - Anterior (Face)  D/C current acne treatment while lupus  active   Return in about 33 weeks (around 12/27/2020) for Lupus .   IMarye Round, CMA, am acting as scribe for Sarina Ser, MD .  Documentation: I have reviewed the above documentation for accuracy and completeness, and I agree with the above.  Sarina Ser, MD

## 2020-05-22 ENCOUNTER — Encounter: Payer: Self-pay | Admitting: Dermatology

## 2020-05-29 ENCOUNTER — Other Ambulatory Visit: Payer: Self-pay

## 2020-05-29 DIAGNOSIS — J4520 Mild intermittent asthma, uncomplicated: Secondary | ICD-10-CM

## 2020-05-29 MED ORDER — PULMICORT FLEXHALER 90 MCG/ACT IN AEPB: 2.0000 | INHALATION_SPRAY | Freq: Two times a day (BID) | RESPIRATORY_TRACT | 1 refills | Status: DC

## 2020-06-01 ENCOUNTER — Telehealth: Payer: Self-pay

## 2020-06-01 NOTE — Telephone Encounter (Signed)
Confirmed and screened for 06-05-20 ov.

## 2020-06-05 ENCOUNTER — Ambulatory Visit (INDEPENDENT_AMBULATORY_CARE_PROVIDER_SITE_OTHER): Payer: Medicare Other | Admitting: Nurse Practitioner

## 2020-06-05 ENCOUNTER — Encounter: Payer: Self-pay | Admitting: Nurse Practitioner

## 2020-06-05 ENCOUNTER — Other Ambulatory Visit: Payer: Self-pay

## 2020-06-05 VITALS — BP 140/82 | HR 92 | Temp 97.2°F | Resp 16 | Ht 66.0 in | Wt 236.6 lb

## 2020-06-05 DIAGNOSIS — Z0001 Encounter for general adult medical examination with abnormal findings: Secondary | ICD-10-CM

## 2020-06-05 DIAGNOSIS — R300 Dysuria: Secondary | ICD-10-CM | POA: Diagnosis not present

## 2020-06-05 DIAGNOSIS — N920 Excessive and frequent menstruation with regular cycle: Secondary | ICD-10-CM | POA: Diagnosis not present

## 2020-06-05 DIAGNOSIS — D509 Iron deficiency anemia, unspecified: Secondary | ICD-10-CM | POA: Diagnosis not present

## 2020-06-05 DIAGNOSIS — M329 Systemic lupus erythematosus, unspecified: Secondary | ICD-10-CM | POA: Diagnosis not present

## 2020-06-05 NOTE — Progress Notes (Signed)
[facility] [place], [place] [address]  Internal MEDICINE  Office Visit Note  Patient Name: Morgan Kent  [id number]  [id number]  Date of Service: 07/01/2020   Pt is here for routine health maintenance examination   Chief Complaint  Patient presents with  . Medicare Wellness  . Depression  . Hypertension  . Quality Metric Gaps    TDAP, wants to ask about COVID vaccine     The patient is here for health maintenance exam. Her last pap smear was done 05/06/2018 and was normal. The patient was recently seen per GYN and treated for fibroid tumor with heavy menstrual bleeding. Her oral contraceptive was changed and she is doing much better. A follow up ultrasound was done. It shows the echo texture is heterogenous with evidence of isoechoic ill-defined, irregular margin fibroids throughout the uterus. She states that periods are doing mch better.  The patient does have history of iron deficiency anemia. She is now taking iron supplement. Most recent check of CBC was done per her rheumatologist. This showed Hgb unchanged at 9.8. will recheck. She should remain on vitamin d weekly.  She continues to see rheumatology and dermatology for significant lupus. They manage medications for this condition.    Current Medication: Outpatient Encounter Medications as of 06/05/2020  Medication Sig  . adapalene (DIFFERIN) 0.1 % gel Apply 1 application topically at bedtime. Apply to forehead and chin  . albuterol (VENTOLIN HFA) 108 (90 Base) MCG/ACT inhaler Inhale 2 puffs into the lungs daily.  Rosalia Hammers FE 1.5/30 1.5-30 MG-MCG tablet TAKE 1 TABLET BY MOUTH AT BEDTIME  . Budesonide (PULMICORT FLEXHALER) 90 MCG/ACT inhaler Inhale 2 puffs into the lungs 2 (two) times daily.  . carvedilol (COREG) 25 MG tablet TAKE 1 TABLET BY MOUTH TWICE A DAY  . cetirizine (ZYRTEC) 10 MG tablet Take 1 tablet (10 mg total) by mouth daily.  . clobetasol (TEMOVATE) 0.05 % external solution Apply  1 application topically 2 (two) times daily as needed. Apply to scalp  . cyclobenzaprine (FLEXERIL) 10 MG tablet Take 10 mg by mouth 3 (three) times daily as needed.  . diclofenac sodium (VOLTAREN) 1 % GEL Apply 1 application topically 2 (two) times daily as needed (for pain).  Marland Kitchen doxycycline (VIBRA-TABS) 100 MG tablet Take 100 mg by mouth 2 (two) times daily.  . ergocalciferol (DRISDOL) 1.25 MG (50000 UT) capsule Take 1 capsule (50,000 Units total) by mouth once a week.  . ferrous sulfate (FERROUSUL) 325 (65 FE) MG tablet Take 1 tablet (325 mg total) by mouth daily with breakfast.  . fluticasone (FLONASE) 50 MCG/ACT nasal spray Place 1 spray into both nostrils at bedtime.  . gabapentin (NEURONTIN) 300 MG capsule Take 1 capsule (300 mg total) by mouth 3 (three) times daily.  . hydrochlorothiazide (HYDRODIURIL) 12.5 MG tablet Take 1 tablet (12.5 mg total) by mouth daily.  . hydrochlorothiazide (HYDRODIURIL) 12.5 MG tablet Take 1 tablet (12.5 mg total) by mouth daily.  . hydroxychloroquine (PLAQUENIL) 200 MG tablet Take 200 mg by mouth daily.   Marland Kitchen ketoconazole (NIZORAL) 2 % shampoo Apply 1 application topically every 3 (three) days. Let sit 5 minutes before rinsing out  . lisinopril (ZESTRIL) 5 MG tablet Take 1 tablet (5 mg total) by mouth daily.  . meloxicam (MOBIC) 7.5 MG tablet Take 1 tablet (7.5 mg total) by mouth 2 (two) times daily.  . mometasone (ELOCON) 0.1 % lotion Apply topically daily. Qd to aa lupus in scalp prn flares  .  mometasone (ELOCON) 0.1 % ointment Apply topically daily.  . mometasone (ELOCON) 0.1 % ointment Apply topically daily. Qd prn flares to lupus sores on the face  . mometasone-formoterol (DULERA) 100-5 MCG/ACT AERO Inhale 2 puffs into the lungs 2 (two) times daily.  . montelukast (SINGULAIR) 10 MG tablet Take 1 tablet (10 mg total) by mouth at bedtime.  . mupirocin ointment (BACTROBAN) 2 % Apply to open area on skin qd-bid  . norethindrone (MICRONOR) 0.35 MG tablet Take 1  tablet (0.35 mg total) by mouth daily.  Marland Kitchen oxyCODONE-acetaminophen (PERCOCET) 7.5-325 MG tablet Take 1 tablet by mouth every 4 (four) hours as needed for severe pain.  Vladimir Faster Glycol-Propyl Glycol (SYSTANE ULTRA) 0.4-0.3 % SOLN Place 1 drop into both eyes daily as needed (for dry eyes).   . tacrolimus (PROTOPIC) 0.1 % ointment Apply 1 application topically daily.  Marland Kitchen tretinoin (RETIN-A) 0.05 % cream Apply 1 application topically at bedtime.   . triamcinolone cream (KENALOG) 0.1 % Apply 1 application topically 2 (two) times daily as needed (outbreaks).    No facility-administered encounter medications on file as of 06/05/2020.    Surgical History: Past Surgical History:  Procedure Laterality Date  . carpel tunnel  Left 2014  . CRANIOTOMY  14,06   chiari  . DENTAL SURGERY    . HERNIA REPAIR     umbilical  . PLACEMENT OF LUMBAR DRAIN N/A 07/07/2014   Procedure: PLACEMENT OF LUMBAR DRAIN AND CLOSURE OF CERVICAL INCISION;  Surgeon: Newman Pies, MD;  Location: [place] [facility];  Service: Neurosurgery;  Laterality: N/A;  . SUBOCCIPITAL CRANIECTOMY CERVICAL LAMINECTOMY N/A 09/29/2013   Procedure: SUBOCCIPITAL CRANIECTOMY CERVICAL LAMINECTOMY/DURAPLASTY;  Surgeon: Ophelia Charter, MD;  Location: [place] [facility];  Service: Neurosurgery;  Laterality: N/A;  posterior  . SUBOCCIPITAL CRANIECTOMY CERVICAL LAMINECTOMY N/A 06/23/2014   Procedure: SUBOCCIPITAL CRANIECTOMY CERVICAL LAMINECTOMY/DURAPLASTY;  Surgeon: Newman Pies, MD;  Location: [place] [facility];  Service: Neurosurgery;  Laterality: N/A;  suboccipital craniectomy with cervical laminectomy and duraplasty    Medical History: Past Medical History:  Diagnosis Date  . Anxiety   . Arthritis    rheumo possible  . Chiari malformation   . Depression   . Dizziness   . Headache(784.0)   . History of kidney stones   . Hypertension   . Lupus ([place])   . Lupus ([place])   . Nerve damage    NECK  . PONV (postoperative nausea and vomiting)    nausea  only  . TMJ arthritis     Family History: Family History  Problem Relation Age of Onset  . Heart disease Father   . Hypertension Father   . Hypertension Mother   . Thyroid disease Mother   . Asthma Other   . Depression Other   . Diabetes Other   . Migraines Other   . Stroke Other       Review of Systems  Constitutional: Positive for fatigue. Negative for activity change, chills and unexpected weight change.  HENT: Negative for congestion, ear pain, postnasal drip, rhinorrhea, sinus pressure, sinus pain, sneezing and sore throat.   Respiratory: Negative for cough, chest tightness, shortness of breath and wheezing.   Cardiovascular: Negative for chest pain and palpitations.  Gastrointestinal: Negative for abdominal pain, constipation, diarrhea, nausea and vomiting.  Endocrine: Negative for cold intolerance, heat intolerance, polydipsia and polyuria.  Genitourinary: Positive for menstrual problem.       Improved menstrual cycles.   Musculoskeletal: Positive for back pain and myalgias. Negative for  arthralgias, joint swelling and neck pain.       Sees pain management provider   Skin: Negative for rash.       Skin changes related to lupus.   Allergic/Immunologic: Positive for environmental allergies.  Neurological: Positive for numbness and headaches. Negative for tremors and weakness.  Hematological: Negative for adenopathy. Does not bruise/bleed easily.  Psychiatric/Behavioral: Negative for behavioral problems (Depression), sleep disturbance and suicidal ideas. The patient is nervous/anxious.     Today's Vitals   06/05/20 1028  BP: (!) 140/82  Pulse: 92  Resp: 16  Temp: (!) 97.2 F (36.2 C)  SpO2: 99%  Weight: (!) 236 lb 9.6 oz (107.3 kg)  Height: 5\' 6"  (1.676 m)   Body mass index is 38.19 kg/m.  Physical Exam Vitals and nursing note reviewed.  Constitutional:      General: She is not in acute distress.    Appearance: Normal appearance. She is well-developed. She  is obese. She is not diaphoretic.  HENT:     Head: Normocephalic and atraumatic.     Nose: Nose normal.     Mouth/Throat:     Pharynx: No oropharyngeal exudate.  Eyes:     Pupils: Pupils are equal, round, and reactive to light.  Neck:     Thyroid: No thyromegaly.     Vascular: No carotid bruit or JVD.     Trachea: No tracheal deviation.  Cardiovascular:     Rate and Rhythm: Normal rate and regular rhythm.     Pulses: Normal pulses.     Heart sounds: Normal heart sounds. No murmur heard.  No friction rub. No gallop.   Pulmonary:     Effort: Pulmonary effort is normal. No respiratory distress.     Breath sounds: Normal breath sounds. No wheezing or rales.  Chest:     Chest wall: No tenderness.     Breasts:        Right: Normal. No swelling, bleeding, inverted nipple, mass, nipple discharge, skin change or tenderness.        Left: Normal. No swelling, bleeding, inverted nipple, mass, nipple discharge, skin change or tenderness.  Abdominal:     General: Bowel sounds are normal.     Palpations: Abdomen is soft.     Tenderness: There is no abdominal tenderness.  Musculoskeletal:        General: Normal range of motion.     Cervical back: Normal range of motion and neck supple.     Comments: Generalized joint pain without point tenderness present.   Lymphadenopathy:     Cervical: No cervical adenopathy.     Upper Body:     Right upper body: No axillary adenopathy.     Left upper body: No axillary adenopathy.  Skin:    General: Skin is warm and dry.     Comments: Psoriatic type plaques present on the limbs and face have improved since the patient was last seen.   Neurological:     Mental Status: She is alert and oriented to person, place, and time.     Cranial Nerves: No cranial nerve deficit.  Psychiatric:        Mood and Affect: Mood normal.        Behavior: Behavior normal.        Thought Content: Thought content normal.        Judgment: Judgment normal.    Depression  screen [facility] 2/9 06/05/2020 02/24/2020 10/06/2019 06/04/2019 01/21/2019  Decreased Interest 0 0 0 0 0  Down, Depressed, Hopeless 0 0 0 0 0  PHQ - 2 Score 0 0 0 0 0    Functional Status Survey: Is the patient deaf or have difficulty hearing?: No Does the patient have difficulty seeing, even when wearing glasses/contacts?: No Does the patient have difficulty concentrating, remembering, or making decisions?: No Does the patient have difficulty walking or climbing stairs?: Yes (stairs) Does the patient have difficulty dressing or bathing?: No Does the patient have difficulty doing errands alone such as visiting a doctor's office or shopping?: No  MMSE - [place] Exam 06/05/2020 06/04/2019  Orientation to time 5 5  Orientation to Place 5 5  Registration 3 3  Attention/ Calculation 5 5  Recall 3 3  Language- name 2 objects 2 2  Language- repeat 1 1  Language- follow 3 step command 3 3  Language- read & follow direction 1 1  Write a sentence 1 1  Copy design 1 1  Total score 30 30    Fall Risk  06/05/2020 02/24/2020 10/06/2019 06/04/2019 01/21/2019  Falls in the past year? 0 0 0 0 0      LABS: Recent Results (from the past 2160 hour(s))  UA/M w/rflx Culture, Routine     Status: Abnormal   Collection Time: 06/05/20 10:29 AM   Specimen: Urine   Urine  Result Value Ref Range   Specific Gravity, UA 1.026 1.005 - 1.030   pH, UA 5.5 5.0 - 7.5   Color, UA Yellow Yellow   Appearance Ur Clear Clear   Leukocytes,UA Negative Negative   Protein,UA 1+ (A) Negative/Trace   Glucose, UA Negative Negative   Ketones, UA Trace (A) Negative   RBC, UA Negative Negative   Bilirubin, UA Negative Negative   Urobilinogen, Ur 0.2 0.2 - 1.0 mg/dL   Nitrite, UA Negative Negative   Microscopic Examination See below:     Comment: Microscopic was indicated and was performed.   Urinalysis Reflex Comment     Comment: This specimen will not reflex to a Urine Culture.  Microscopic Examination      Status: None   Collection Time: 06/05/20 10:29 AM   Urine  Result Value Ref Range   WBC, UA 0-5 0 - 5 /hpf   RBC 0-2 0 - 2 /hpf   Epithelial Cells (non renal) 0-10 0 - 10 /hpf   Casts None seen None seen /lpf   Bacteria, UA None seen None seen/Few    Assessment/Plan: 1. Encounter for annual general medical examination with abnormal findings in adult Annual health maintenance exam today.  2. Menorrhagia with regular cycle Improved. Continue with OCP as prescribed and regular visits with GYN as scheduled.   3. Iron deficiency anemia, unspecified iron deficiency anemia type Recheck CBC with anemia panel for further evaluation.   4. Systemic lupus erythematosus, unspecified SLE type, unspecified organ involvement status ([place]) Continue regular visits with rheumatology as scheduled.   5. Dysuria - UA/M w/rflx Culture, Routine  General Counseling: Veora verbalizes understanding of the findings of todays visit and agrees with plan of treatment. I have discussed any further diagnostic evaluation that may be needed or ordered today. We also reviewed her medications today. she has been encouraged to call the office with any questions or concerns that should arise related to todays visit.    Counseling:  This patient was seen by Leretha Pol FNP Collaboration with Dr Lavera Guise as a part of collaborative care agreement  Orders Placed This Encounter  Procedures  .  Microscopic Examination  . UA/M w/rflx Culture, Routine     Total time spent: 74 Minutes  Time spent includes review of chart, medications, test results, and follow up plan with the patient.     Lavera Guise, MD  Internal Medicine

## 2020-06-06 LAB — UA/M W/RFLX CULTURE, ROUTINE
Bilirubin, UA: NEGATIVE
Glucose, UA: NEGATIVE
Leukocytes,UA: NEGATIVE
Nitrite, UA: NEGATIVE
RBC, UA: NEGATIVE
Specific Gravity, UA: 1.026 (ref 1.005–1.030)
Urobilinogen, Ur: 0.2 mg/dL (ref 0.2–1.0)
pH, UA: 5.5 (ref 5.0–7.5)

## 2020-06-06 LAB — MICROSCOPIC EXAMINATION
Bacteria, UA: NONE SEEN
Casts: NONE SEEN /lpf

## 2020-06-16 DIAGNOSIS — M5412 Radiculopathy, cervical region: Secondary | ICD-10-CM | POA: Diagnosis not present

## 2020-06-16 DIAGNOSIS — G629 Polyneuropathy, unspecified: Secondary | ICD-10-CM | POA: Diagnosis not present

## 2020-06-16 DIAGNOSIS — M79661 Pain in right lower leg: Secondary | ICD-10-CM | POA: Diagnosis not present

## 2020-06-16 DIAGNOSIS — Z6839 Body mass index (BMI) 39.0-39.9, adult: Secondary | ICD-10-CM | POA: Diagnosis not present

## 2020-06-16 DIAGNOSIS — M25512 Pain in left shoulder: Secondary | ICD-10-CM | POA: Diagnosis not present

## 2020-06-16 DIAGNOSIS — M545 Low back pain: Secondary | ICD-10-CM | POA: Diagnosis not present

## 2020-06-16 DIAGNOSIS — G909 Disorder of the autonomic nervous system, unspecified: Secondary | ICD-10-CM | POA: Diagnosis not present

## 2020-06-16 DIAGNOSIS — M542 Cervicalgia: Secondary | ICD-10-CM | POA: Diagnosis not present

## 2020-06-16 DIAGNOSIS — M25511 Pain in right shoulder: Secondary | ICD-10-CM | POA: Diagnosis not present

## 2020-06-16 DIAGNOSIS — G545 Neuralgic amyotrophy: Secondary | ICD-10-CM | POA: Diagnosis not present

## 2020-06-16 DIAGNOSIS — M79662 Pain in left lower leg: Secondary | ICD-10-CM | POA: Diagnosis not present

## 2020-06-16 DIAGNOSIS — I10 Essential (primary) hypertension: Secondary | ICD-10-CM | POA: Diagnosis not present

## 2020-07-11 ENCOUNTER — Other Ambulatory Visit: Payer: Self-pay | Admitting: Obstetrics and Gynecology

## 2020-07-11 DIAGNOSIS — N921 Excessive and frequent menstruation with irregular cycle: Secondary | ICD-10-CM

## 2020-07-11 DIAGNOSIS — Z30011 Encounter for initial prescription of contraceptive pills: Secondary | ICD-10-CM

## 2020-08-01 DIAGNOSIS — M329 Systemic lupus erythematosus, unspecified: Secondary | ICD-10-CM | POA: Diagnosis not present

## 2020-08-01 DIAGNOSIS — L930 Discoid lupus erythematosus: Secondary | ICD-10-CM | POA: Diagnosis not present

## 2020-08-01 DIAGNOSIS — G5602 Carpal tunnel syndrome, left upper limb: Secondary | ICD-10-CM | POA: Diagnosis not present

## 2020-08-01 DIAGNOSIS — Q0700 Arnold-Chiari syndrome without spina bifida or hydrocephalus: Secondary | ICD-10-CM | POA: Diagnosis not present

## 2020-08-01 DIAGNOSIS — G5601 Carpal tunnel syndrome, right upper limb: Secondary | ICD-10-CM | POA: Diagnosis not present

## 2020-08-28 ENCOUNTER — Other Ambulatory Visit: Payer: Self-pay | Admitting: Nurse Practitioner

## 2020-08-28 DIAGNOSIS — D509 Iron deficiency anemia, unspecified: Secondary | ICD-10-CM | POA: Diagnosis not present

## 2020-08-28 LAB — CBC WITH DIFFERENTIAL/PLATELET
Basophils Absolute: 0 10*3/uL (ref 0.0–0.2)
Basos: 0 %
EOS (ABSOLUTE): 0.2 10*3/uL (ref 0.0–0.4)
Eos: 3 %
Hematocrit: 31.2 % — ABNORMAL LOW (ref 34.0–46.6)
Hemoglobin: 10.2 g/dL — ABNORMAL LOW (ref 11.1–15.9)
Immature Grans (Abs): 0 10*3/uL (ref 0.0–0.1)
Immature Granulocytes: 0 %
Lymphocytes Absolute: 1.4 10*3/uL (ref 0.7–3.1)
Lymphs: 21 %
MCH: 30.3 pg (ref 26.6–33.0)
MCHC: 32.7 g/dL (ref 31.5–35.7)
MCV: 93 fL (ref 79–97)
Monocytes Absolute: 0.5 10*3/uL (ref 0.1–0.9)
Monocytes: 8 %
Neutrophils Absolute: 4.6 10*3/uL (ref 1.4–7.0)
Neutrophils: 68 %
Platelets: 263 10*3/uL (ref 150–450)
RBC: 3.37 x10E6/uL — ABNORMAL LOW (ref 3.77–5.28)
RDW: 13.5 % (ref 11.7–15.4)
WBC: 6.7 10*3/uL (ref 3.4–10.8)

## 2020-08-30 ENCOUNTER — Telehealth: Payer: Self-pay

## 2020-08-30 NOTE — Telephone Encounter (Signed)
Called [facility] and added on iron studies per DFK dbs

## 2020-08-30 NOTE — Telephone Encounter (Signed)
-----   Message from Lavera Guise, MD sent at 08/29/2020  5:56 PM EDT ----- Iron studies ass on M21, folic acid, ferritin, iron and tibc please

## 2020-09-18 ENCOUNTER — Other Ambulatory Visit: Payer: Self-pay

## 2020-09-18 MED ORDER — LISINOPRIL 5 MG PO TABS: 5.0000 mg | ORAL_TABLET | Freq: Every day | ORAL | 3 refills | Status: DC

## 2020-09-18 MED ORDER — HYDROCHLOROTHIAZIDE 12.5 MG PO TABS
12.5000 mg | ORAL_TABLET | Freq: Every day | ORAL | 3 refills | Status: DC
Start: 2020-09-18 — End: 2020-11-06

## 2020-09-18 MED ORDER — CARVEDILOL 25 MG PO TABS
25.0000 mg | ORAL_TABLET | Freq: Two times a day (BID) | ORAL | 3 refills | Status: DC
Start: 2020-09-18 — End: 2021-06-26

## 2020-09-18 NOTE — Telephone Encounter (Signed)
*  STAT* If patient is at the pharmacy, call can be transferred to refill team.   1. Which medications need to be refilled? (please list name of each medication and dose if known) Lisinopril, Hydrochlorothiazide, Carvedilol  2. Which pharmacy/location (including street and city if local pharmacy) is medication to be sent to? [facility] [place]  3. Do they need a 30 day or 90 day supply? [place]

## 2020-10-03 ENCOUNTER — Other Ambulatory Visit: Payer: Self-pay

## 2020-10-03 ENCOUNTER — Encounter: Payer: Self-pay | Admitting: Nurse Practitioner

## 2020-10-03 ENCOUNTER — Ambulatory Visit (INDEPENDENT_AMBULATORY_CARE_PROVIDER_SITE_OTHER): Payer: Medicare Other | Admitting: Nurse Practitioner

## 2020-10-03 VITALS — BP 142/88 | HR 106 | Temp 98.0°F | Resp 16 | Ht 66.0 in | Wt 234.0 lb

## 2020-10-03 DIAGNOSIS — J4520 Mild intermittent asthma, uncomplicated: Secondary | ICD-10-CM

## 2020-10-03 DIAGNOSIS — E559 Vitamin D deficiency, unspecified: Secondary | ICD-10-CM | POA: Diagnosis not present

## 2020-10-03 DIAGNOSIS — M064 Inflammatory polyarthropathy: Secondary | ICD-10-CM | POA: Diagnosis not present

## 2020-10-03 DIAGNOSIS — D509 Iron deficiency anemia, unspecified: Secondary | ICD-10-CM | POA: Diagnosis not present

## 2020-10-03 DIAGNOSIS — I10 Essential (primary) hypertension: Secondary | ICD-10-CM | POA: Diagnosis not present

## 2020-10-03 MED ORDER — FERROUS SULFATE 325 (65 FE) MG PO TABS: 325.0000 mg | ORAL_TABLET | Freq: Every day | ORAL | 1 refills | Status: DC

## 2020-10-03 MED ORDER — ERGOCALCIFEROL 1.25 MG (50000 UT) PO CAPS: 50000.0000 [IU] | ORAL_CAPSULE | ORAL | 5 refills | Status: DC

## 2020-10-03 NOTE — Progress Notes (Signed)
[facility] [place], [place] [address]  Internal MEDICINE  Office Visit Note  Patient Name: Morgan Kent  [id number]  [id number]  Date of Service: 10/03/2020  Chief Complaint  Patient presents with  . Follow-up  . Depression  . Hypertension  . Quality Metric Gaps    flu,covid  . controlled substance form    reviewed with PT    The patient is here for follow up visit. Lupus is flaring up. Did see her rheumatologist.he has upped her dose of plaquenil. Did have sore throat the other day which cleared up after drinking tea and gargling with warm salt water. She states that she is otherwise, feeling well. Her blood pressure is moderately elevated. She has been taking iron supplement since her last visit and eating foods high in iron. CBC was rechecked prior to this visit and, though still mildly anemic, this has improved.       Current Medication: Outpatient Encounter Medications as of 10/03/2020  Medication Sig  . adapalene (DIFFERIN) 0.1 % gel Apply 1 application topically at bedtime. Apply to forehead and chin  . albuterol (VENTOLIN HFA) 108 (90 Base) MCG/ACT inhaler Inhale 2 puffs into the lungs daily.  Rosalia Hammers FE 1.5/30 1.5-30 MG-MCG tablet TAKE 1 TABLET BY MOUTH AT BEDTIME  . Budesonide (PULMICORT FLEXHALER) 90 MCG/ACT inhaler Inhale 2 puffs into the lungs 2 (two) times daily.  . carvedilol (COREG) 25 MG tablet Take 1 tablet (25 mg total) by mouth 2 (two) times daily.  . cetirizine (ZYRTEC) 10 MG tablet Take 1 tablet (10 mg total) by mouth daily.  . clobetasol (TEMOVATE) 0.05 % external solution Apply 1 application topically 2 (two) times daily as needed. Apply to scalp  . cyclobenzaprine (FLEXERIL) 10 MG tablet Take 10 mg by mouth 3 (three) times daily as needed.  . diclofenac sodium (VOLTAREN) 1 % GEL Apply 1 application topically 2 (two) times daily as needed (for pain).  Marland Kitchen doxycycline (VIBRA-TABS) 100 MG tablet Take 100 mg by mouth 2 (two)  times daily.  . ergocalciferol (DRISDOL) 1.25 MG (50000 UT) capsule Take 1 capsule (50,000 Units total) by mouth once a week.  . ferrous sulfate (FERROUSUL) 325 (65 FE) MG tablet Take 1 tablet (325 mg total) by mouth daily with breakfast.  . fluticasone (FLONASE) 50 MCG/ACT nasal spray Place 1 spray into both nostrils at bedtime.  . gabapentin (NEURONTIN) 300 MG capsule Take 1 capsule (300 mg total) by mouth 3 (three) times daily.  . hydrochlorothiazide (HYDRODIURIL) 12.5 MG tablet Take 1 tablet (12.5 mg total) by mouth daily.  . hydrochlorothiazide (HYDRODIURIL) 12.5 MG tablet Take 1 tablet (12.5 mg total) by mouth daily.  . hydroxychloroquine (PLAQUENIL) 200 MG tablet Take 200 mg by mouth daily.   Marland Kitchen ketoconazole (NIZORAL) 2 % shampoo Apply 1 application topically every 3 (three) days. Let sit 5 minutes before rinsing out  . lisinopril (ZESTRIL) 5 MG tablet Take 1 tablet (5 mg total) by mouth daily.  . meloxicam (MOBIC) 7.5 MG tablet Take 1 tablet (7.5 mg total) by mouth 2 (two) times daily.  . mometasone (ELOCON) 0.1 % lotion Apply topically daily. Qd to aa lupus in scalp prn flares  . mometasone (ELOCON) 0.1 % ointment Apply topically daily.  . mometasone (ELOCON) 0.1 % ointment Apply topically daily. Qd prn flares to lupus sores on the face  . mometasone-formoterol (DULERA) 100-5 MCG/ACT AERO Inhale 2 puffs into the lungs 2 (two) times daily.  . montelukast (SINGULAIR)  10 MG tablet Take 1 tablet (10 mg total) by mouth at bedtime.  . mupirocin ointment (BACTROBAN) 2 % Apply to open area on skin qd-bid  . norethindrone (MICRONOR) 0.35 MG tablet Take 1 tablet (0.35 mg total) by mouth daily.  Marland Kitchen oxyCODONE-acetaminophen (PERCOCET) 7.5-325 MG tablet Take 1 tablet by mouth every 4 (four) hours as needed for severe pain.  Vladimir Faster Glycol-Propyl Glycol (SYSTANE ULTRA) 0.4-0.3 % SOLN Place 1 drop into both eyes daily as needed (for dry eyes).   . tacrolimus (PROTOPIC) 0.1 % ointment Apply 1  application topically daily.  Marland Kitchen tretinoin (RETIN-A) 0.05 % cream Apply 1 application topically at bedtime.   . triamcinolone cream (KENALOG) 0.1 % Apply 1 application topically 2 (two) times daily as needed (outbreaks).   . [DISCONTINUED] ergocalciferol (DRISDOL) 1.25 MG (50000 UT) capsule Take 1 capsule (50,000 Units total) by mouth once a week.  . [DISCONTINUED] ferrous sulfate (FERROUSUL) 325 (65 FE) MG tablet Take 1 tablet (325 mg total) by mouth daily with breakfast.   No facility-administered encounter medications on file as of 10/03/2020.    Surgical History: Past Surgical History:  Procedure Laterality Date  . carpel tunnel  Left 2014  . CRANIOTOMY  14,06   chiari  . DENTAL SURGERY    . HERNIA REPAIR     umbilical  . PLACEMENT OF LUMBAR DRAIN N/A 07/07/2014   Procedure: PLACEMENT OF LUMBAR DRAIN AND CLOSURE OF CERVICAL INCISION;  Surgeon: Newman Pies, MD;  Location: [place] [facility];  Service: Neurosurgery;  Laterality: N/A;  . SUBOCCIPITAL CRANIECTOMY CERVICAL LAMINECTOMY N/A 09/29/2013   Procedure: SUBOCCIPITAL CRANIECTOMY CERVICAL LAMINECTOMY/DURAPLASTY;  Surgeon: Ophelia Charter, MD;  Location: [place];  Service: Neurosurgery;  Laterality: N/A;  posterior  . SUBOCCIPITAL CRANIECTOMY CERVICAL LAMINECTOMY N/A 06/23/2014   Procedure: SUBOCCIPITAL CRANIECTOMY CERVICAL LAMINECTOMY/DURAPLASTY;  Surgeon: Newman Pies, MD;  Location: [place] [facility];  Service: Neurosurgery;  Laterality: N/A;  suboccipital craniectomy with cervical laminectomy and duraplasty    Medical History: Past Medical History:  Diagnosis Date  . Anxiety   . Arthritis    rheumo possible  . Chiari malformation   . Depression   . Dizziness   . Headache(784.0)   . History of kidney stones   . Hypertension   . Lupus ([place])   . Lupus ([place])   . Nerve damage    NECK  . PONV (postoperative nausea and vomiting)    nausea only  . TMJ arthritis     Family History: Family History  Problem Relation Age  of Onset  . Heart disease Father   . Hypertension Father   . Hypertension Mother   . Thyroid disease Mother   . Asthma Other   . Depression Other   . Diabetes Other   . Migraines Other   . Stroke Other     Social History   Socioeconomic History  . Marital status: Single    Spouse name: Not on file  . Number of children: Not on file  . Years of education: Not on file  . Highest education level: Not on file  Occupational History  . Not on file  Tobacco Use  . Smoking status: Never Smoker  . Smokeless tobacco: Never Used  Vaping Use  . Vaping Use: Never used  Substance and Sexual Activity  . Alcohol use: No    Alcohol/week: 0.0 standard drinks  . Drug use: No  . Sexual activity: Not on file  Other Topics Concern  . Not on  file  Social History Narrative   Lives with 3 children in 2 story home.  Unemployed.  Trying to get disability.  Education: high school.   Social Determinants of Health   Financial Resource Strain:   . Difficulty of Paying Living Expenses: Not on file  Food Insecurity:   . Worried About Charity fundraiser in the Last Year: Not on file  . Ran Out of Food in the Last Year: Not on file  Transportation Needs:   . Lack of Transportation (Medical): Not on file  . Lack of Transportation (Non-Medical): Not on file  Physical Activity:   . Days of Exercise per Week: Not on file  . Minutes of Exercise per Session: Not on file  Stress:   . Feeling of Stress : Not on file  Social Connections:   . Frequency of Communication with Friends and Family: Not on file  . Frequency of Social Gatherings with Friends and Family: Not on file  . Attends Religious Services: Not on file  . Active Member of Clubs or Organizations: Not on file  . Attends Archivist Meetings: Not on file  . Marital Status: Not on file  Intimate Partner Violence:   . Fear of Current or Ex-Partner: Not on file  . Emotionally Abused: Not on file  . Physically Abused: Not on file   . Sexually Abused: Not on file      Review of Systems  Constitutional: Positive for fatigue. Negative for chills and unexpected weight change.  HENT: Negative for congestion, postnasal drip, rhinorrhea, sneezing and sore throat.   Respiratory: Negative for cough, chest tightness, shortness of breath and wheezing.   Cardiovascular: Negative for chest pain and palpitations.  Gastrointestinal: Negative for abdominal pain, constipation, diarrhea, nausea and vomiting.  Endocrine: Negative for cold intolerance, heat intolerance, polydipsia and polyuria.  Musculoskeletal: Positive for arthralgias and myalgias. Negative for back pain, joint swelling and neck pain.  Skin: Negative for rash.  Allergic/Immunologic: Positive for environmental allergies.  Neurological: Negative for dizziness, tremors, numbness and headaches.  Hematological: Negative for adenopathy. Does not bruise/bleed easily.  Psychiatric/Behavioral: Negative for behavioral problems (Depression), sleep disturbance and suicidal ideas. The patient is not nervous/anxious.     Today's Vitals   10/03/20 0850  BP: (!) 142/88  Pulse: (!) 106  Resp: 16  Temp: 98 F (36.7 C)  SpO2: 99%  Weight: 234 lb (106.1 kg)  Height: 5\' 6"  (1.676 m)   Body mass index is 37.77 kg/m.  Physical Exam Vitals and nursing note reviewed.  Constitutional:      General: She is not in acute distress.    Appearance: Normal appearance. She is well-developed. She is obese. She is not diaphoretic.  HENT:     Head: Normocephalic and atraumatic.     Right Ear: Ear canal and external ear normal.     Left Ear: Ear canal and external ear normal.     Nose: Nose normal.     Mouth/Throat:     Pharynx: No oropharyngeal exudate.  Eyes:     Pupils: Pupils are equal, round, and reactive to light.  Neck:     Thyroid: No thyromegaly.     Vascular: No JVD.     Trachea: No tracheal deviation.  Cardiovascular:     Rate and Rhythm: Normal rate and regular  rhythm.     Heart sounds: Normal heart sounds. No murmur heard.  No friction rub. No gallop.   Pulmonary:     Effort:  Pulmonary effort is normal. No respiratory distress.     Breath sounds: Normal breath sounds. No wheezing or rales.  Chest:     Chest wall: No tenderness.  Abdominal:     Palpations: Abdomen is soft.  Musculoskeletal:        General: Normal range of motion.     Cervical back: Normal range of motion and neck supple.  Lymphadenopathy:     Cervical: No cervical adenopathy.  Skin:    General: Skin is warm and dry.  Neurological:     General: No focal deficit present.     Mental Status: She is alert and oriented to person, place, and time.     Cranial Nerves: No cranial nerve deficit.  Psychiatric:        Mood and Affect: Mood normal.        Behavior: Behavior normal.        Thought Content: Thought content normal.        Judgment: Judgment normal.    Assessment/Plan: 1. Essential hypertension [place]. Continue bp medication as prescribed   2. Mild intermittent asthma without complication Stable. Continue inhalers and respiratory medications as prescribed  3. Vitamin D deficiency Drisdol weekly.  - ergocalciferol (DRISDOL) 1.25 MG (50000 UT) capsule; Take 1 capsule (50,000 Units total) by mouth once a week.  Dispense: 4 capsule; Refill: 5  4. Iron deficiency anemia, unspecified iron deficiency anemia type Improved, but still mildly anemic. Continue with ferrous sulfate daily.  - ferrous sulfate (FERROUSUL) 325 (65 FE) MG tablet; Take 1 tablet (325 mg total) by mouth daily with breakfast.  Dispense: 90 tablet; Refill: 1  5. Inflammatory polyarthritis ([place]) Continue regular visits with pain management provider as scheduled.   General Counseling: Zyann verbalizes understanding of the findings of todays visit and agrees with plan of treatment. I have discussed any further diagnostic evaluation that may be needed or ordered today. We also reviewed her medications  today. she has been encouraged to call the office with any questions or concerns that should arise related to todays visit.   This patient was seen by [facility] [place] with Dr Lavera Guise as a part of collaborative care agreement  Meds ordered this encounter  Medications  . ergocalciferol (DRISDOL) 1.25 MG (50000 UT) capsule    Sig: Take 1 capsule (50,000 Units total) by mouth once a week.    Dispense:  4 capsule    Refill:  5    Order Specific Question:   Supervising Provider    Answer:   Lavera Guise [[id number]]  . ferrous sulfate (FERROUSUL) 325 (65 FE) MG tablet    Sig: Take 1 tablet (325 mg total) by mouth daily with breakfast.    Dispense:  90 tablet    Refill:  1    Order Specific Question:   Supervising Provider    Answer:   Lavera Guise [[id number]]    Total time spent: 30 Minutes   Time spent includes review of chart, medications, test results, and follow up plan with the patient.      Dr Lavera Guise Internal medicine

## 2020-10-17 DIAGNOSIS — G935 Compression of brain: Secondary | ICD-10-CM | POA: Diagnosis not present

## 2020-10-17 DIAGNOSIS — Z6841 Body Mass Index (BMI) 40.0 and over, adult: Secondary | ICD-10-CM | POA: Diagnosis not present

## 2020-11-05 ENCOUNTER — Other Ambulatory Visit: Payer: Self-pay | Admitting: Obstetrics and Gynecology

## 2020-11-05 DIAGNOSIS — N921 Excessive and frequent menstruation with irregular cycle: Secondary | ICD-10-CM

## 2020-11-05 DIAGNOSIS — Z30011 Encounter for initial prescription of contraceptive pills: Secondary | ICD-10-CM

## 2020-11-05 NOTE — Progress Notes (Signed)
Cardiology Office Note  Date:  11/06/2020   ID:  Morgan Kent, Morgan Kent 09-03-1981, MRN [id number]  PCP:  Ronnell Freshwater, NP   Chief Complaint  Patient presents with  . Follow-up    Annual follow up. Medications verbally reviewed with patient.    HPI:  Morgan Kent is a pleasant 39 year old woman with  morbid obesity, lupus, dx in 2013 skin and fingertips affected neck surgery With chronic pain down her arms bilaterally,  sleep apnea on nasal CPAP,   EF 60 to 65% in 11/2015, up from 45% in 2016 who presents for follow-up of her symptoms of shortness of breath, cardiomyopathy  Last seen in clinic December 2020 At that time reported having lupus flareups, Followed by rheumatology, Dr. Jefm Bryant,  Bad year for the skin, fingertips tingling, sometimes blue Reported she has neuropathy, followed by Dr. Arnoldo Morale  Followed by pain clinic in [place] for pain in her neck, arms, legs, hurts to walk  In the hospital January 2021 for iron deficiency anemia  meds reviewed On coreg, lisinopril, HCTZ Feels her blood pressure is well controlled  Chronic SOB on exertion, weight high, no regular exercise No leg swelling, denies abdominal distention  BP elevated today, was rushing Typically well controlled at home  Lab work reviewed  Lab Results  Component Value Date   CHOL 195 10/29/2019   HDL 34 (L) 10/29/2019   LDLCALC 131 (H) 10/29/2019   TRIG 164 (H) 10/29/2019    EKG personally reviewed by myself on todays visit Shows normal sinus rhythm rate 89 bpm no significant ST or T wave changes  PMH:   has a past medical history of Anxiety, Arthritis, Chiari malformation, Depression, Dizziness, Headache(784.0), History of kidney stones, Hypertension, Lupus ([place]), Lupus ([place]), Nerve damage, PONV (postoperative nausea and vomiting), and TMJ arthritis.  PSH:    Past Surgical History:  Procedure Laterality Date  . carpel tunnel  Left 2014  . CRANIOTOMY  14,06   chiari  . DENTAL SURGERY     . HERNIA REPAIR     umbilical  . PLACEMENT OF LUMBAR DRAIN N/A 07/07/2014   Procedure: PLACEMENT OF LUMBAR DRAIN AND CLOSURE OF CERVICAL INCISION;  Surgeon: Newman Pies, MD;  Location: [facility];  Service: Neurosurgery;  Laterality: N/A;  . SUBOCCIPITAL CRANIECTOMY CERVICAL LAMINECTOMY N/A 09/29/2013   Procedure: SUBOCCIPITAL CRANIECTOMY CERVICAL LAMINECTOMY/DURAPLASTY;  Surgeon: Ophelia Charter, MD;  Location: [facility];  Service: Neurosurgery;  Laterality: N/A;  posterior  . SUBOCCIPITAL CRANIECTOMY CERVICAL LAMINECTOMY N/A 06/23/2014   Procedure: SUBOCCIPITAL CRANIECTOMY CERVICAL LAMINECTOMY/DURAPLASTY;  Surgeon: Newman Pies, MD;  Location: [facility];  Service: Neurosurgery;  Laterality: N/A;  suboccipital craniectomy with cervical laminectomy and duraplasty    Current Outpatient Medications  Medication Sig Dispense Refill  . albuterol (VENTOLIN HFA) 108 (90 Base) MCG/ACT inhaler Inhale 2 puffs into the lungs daily. 18 g 5  . AUROVELA FE 1.5/30 1.5-30 MG-MCG tablet TAKE 1 TABLET BY MOUTH AT BEDTIME 28 tablet 1  . Budesonide (PULMICORT FLEXHALER) 90 MCG/ACT inhaler Inhale 2 puffs into the lungs 2 (two) times daily. 3 each 1  . carvedilol (COREG) 25 MG tablet Take 1 tablet (25 mg total) by mouth 2 (two) times daily. 180 tablet 3  . cetirizine (ZYRTEC) 10 MG tablet Take 1 tablet (10 mg total) by mouth daily. 30 tablet 5  . clobetasol (TEMOVATE) 0.05 % external solution Apply 1 application topically 2 (two) times daily as needed. Apply to scalp    . cyclobenzaprine (  FLEXERIL) 10 MG tablet Take 10 mg by mouth 3 (three) times daily as needed.    . diclofenac sodium (VOLTAREN) 1 % GEL Apply 1 application topically 2 (two) times daily as needed (for pain). 300 g 3  . doxycycline (VIBRA-TABS) 100 MG tablet Take 100 mg by mouth 2 (two) times daily.    . ergocalciferol (DRISDOL) 1.25 MG (50000 UT) capsule Take 1 capsule (50,000 Units total) by mouth once a week. 4 capsule 5  .  ferrous sulfate (FERROUSUL) 325 (65 FE) MG tablet Take 1 tablet (325 mg total) by mouth daily with breakfast. 90 tablet 1  . fluticasone (FLONASE) 50 MCG/ACT nasal spray Place 1 spray into both nostrils at bedtime. 48 g 3  . gabapentin (NEURONTIN) 300 MG capsule Take 1 capsule (300 mg total) by mouth 3 (three) times daily. 270 capsule 1  . hydrochlorothiazide (HYDRODIURIL) 12.5 MG tablet Take 1 tablet (12.5 mg total) by mouth daily. 90 tablet 2  . hydroxychloroquine (PLAQUENIL) 200 MG tablet Take 200 mg by mouth daily.     Marland Kitchen ketoconazole (NIZORAL) 2 % shampoo Apply 1 application topically every 3 (three) days. Let sit 5 minutes before rinsing out 120 mL 11  . lisinopril (ZESTRIL) 5 MG tablet Take 1 tablet (5 mg total) by mouth daily. 90 tablet 3  . meloxicam (MOBIC) 7.5 MG tablet Take 1 tablet (7.5 mg total) by mouth 2 (two) times daily. 180 tablet 1  . mometasone (ELOCON) 0.1 % lotion Apply topically daily. Qd to aa lupus in scalp prn flares 60 mL 3  . mometasone (ELOCON) 0.1 % ointment Apply topically daily. Qd prn flares to lupus sores on the face 45 g 6  . mometasone-formoterol (DULERA) 100-5 MCG/ACT AERO Inhale 2 puffs into the lungs 2 (two) times daily.    . montelukast (SINGULAIR) 10 MG tablet Take 1 tablet (10 mg total) by mouth at bedtime. 90 tablet 3  . mupirocin ointment (BACTROBAN) 2 % Apply to open area on skin qd-bid 22 g 5  . NARCAN 4 MG/0.1ML LIQD nasal spray kit SMARTSIG:1 Spray(s) Both Nares Once PRN    . norethindrone (MICRONOR) 0.35 MG tablet Take 1 tablet (0.35 mg total) by mouth daily. 3 Package 3  . oxyCODONE-acetaminophen (PERCOCET) 7.5-325 MG tablet Take 1 tablet by mouth every 4 (four) hours as needed for severe pain.    Vladimir Faster Glycol-Propyl Glycol (SYSTANE ULTRA) 0.4-0.3 % SOLN Place 1 drop into both eyes daily as needed (for dry eyes).     . tacrolimus (PROTOPIC) 0.1 % ointment Apply 1 application topically daily.     No current facility-administered medications  for this visit.     Allergies:   Other, Sulfa antibiotics, and Cephalexin   Social History:  The patient  reports that she has never smoked. She has never used smokeless tobacco. She reports that she does not drink alcohol and does not use drugs.   Family History:   family history includes Asthma in an other family member; Depression in an other family member; Diabetes in an other family member; Heart disease in her father; Hypertension in her father and mother; Migraines in an other family member; Stroke in an other family member; Thyroid disease in her mother.    Review of Systems: Review of Systems  Constitutional: Negative.   HENT: Negative.   Respiratory: Negative.   Cardiovascular: Negative.   Gastrointestinal: Negative.   Musculoskeletal: Negative.        Chronic arm, neck, leg pain, finger  tips sore  Neurological: Negative.   Psychiatric/Behavioral: Negative.   All other systems reviewed and are negative.   PHYSICAL EXAM: VS:  BP 138/90 (BP Location: Left Arm, Patient Position: Sitting, Cuff Size: Normal)   Pulse 89   Ht 5' 6"  (1.676 m)   Wt 228 lb (103.4 kg)   SpO2 97%   BMI 36.80 kg/m  , BMI Body mass index is 36.8 kg/m.  Constitutional:  oriented to person, place, and time. No distress.  HENT:  Head: Grossly normal Eyes:  no discharge. No scleral icterus.  Neck: No JVD, no carotid bruits  Cardiovascular: Regular rate and rhythm, no murmurs appreciated Pulmonary/Chest: Clear to auscultation bilaterally, no wheezes or rails Abdominal: Soft.  no distension.  no tenderness.  Musculoskeletal: Normal range of motion Neurological:  normal muscle tone. Coordination normal. No atrophy Skin: Skin warm and dry Psychiatric: normal affect, pleasant  Recent Labs: 08/28/2020: Hemoglobin 10.2; Platelets 263    Lipid Panel Lab Results  Component Value Date   CHOL 195 10/29/2019   HDL 34 (L) 10/29/2019   LDLCALC 131 (H) 10/29/2019   TRIG 164 (H) 10/29/2019       Wt Readings from Last 3 Encounters:  11/06/20 228 lb (103.4 kg)  10/03/20 234 lb (106.1 kg)  06/05/20 (!) 236 lb 9.6 oz (107.3 kg)      ASSESSMENT AND PLAN:  obesity  She reports unable to exercise secondary to chronic pain Recommended low carbohydrate diet, walking program  Essential hypertension Blood pressure is well controlled on today's visit. No changes made to the medications.  Obstructive sleep apnea on CPAP PMD did a test, "does not need it" Not on CPAP  Shortness of breath Deconditioned, weight is elevated Suggested exercise   Tachycardia Stable on beta-blocker, no changes to her medications stable  Chronic pain, arms Chronic neck, shoulder, back, arm pain  on Neurontin,   followed by Dr. Arnoldo Morale   Total encounter time more than 25 minutes  Greater than 50% was spent in counseling and coordination of care with the patient   Orders Placed This Encounter  Procedures  . EKG 12-Lead     Signed, Esmond Plants, M.D., Ph.D. 11/06/2020  [place], [place]

## 2020-11-06 ENCOUNTER — Other Ambulatory Visit: Payer: Self-pay

## 2020-11-06 ENCOUNTER — Ambulatory Visit (INDEPENDENT_AMBULATORY_CARE_PROVIDER_SITE_OTHER): Payer: Medicare Other | Admitting: Cardiovascular Disease

## 2020-11-06 ENCOUNTER — Encounter: Payer: Self-pay | Admitting: Cardiovascular Disease

## 2020-11-06 VITALS — BP 138/90 | HR 89 | Ht 66.0 in | Wt 228.0 lb

## 2020-11-06 DIAGNOSIS — I420 Dilated cardiomyopathy: Secondary | ICD-10-CM | POA: Diagnosis not present

## 2020-11-06 DIAGNOSIS — I5022 Chronic systolic (congestive) heart failure: Secondary | ICD-10-CM

## 2020-11-06 DIAGNOSIS — L930 Discoid lupus erythematosus: Secondary | ICD-10-CM

## 2020-11-06 DIAGNOSIS — G4733 Obstructive sleep apnea (adult) (pediatric): Secondary | ICD-10-CM | POA: Diagnosis not present

## 2020-11-06 NOTE — Patient Instructions (Signed)
Medication Instructions:  No changes  If you need a refill on your cardiac medications before your next appointment, please call your pharmacy.    Lab work: No new labs needed   If you have labs (blood work) drawn today and your tests are completely normal, you will receive your results only by: . MyChart Message (if you have MyChart) OR . A paper copy in the mail If you have any lab test that is abnormal or we need to change your treatment, we will call you to review the results.   Testing/Procedures: No new testing needed   Follow-Up: At [facility], you and your health needs are our priority.  As part of our continuing mission to provide you with exceptional heart care, we have created designated Provider Care Teams.  These Care Teams include your primary Cardiologist (physician) and Advanced Practice Providers (APPs -  Physician Assistants and Nurse Practitioners) who all work together to provide you with the care you need, when you need it.  . You will need a follow up appointment in 12 months  . Providers on your designated Care Team:   . Christopher Berge, NP . Ryan Dunn, PA-C . Jacquelyn Visser, PA-C  Any Other Special Instructions Will Be Listed Below (If Applicable).  COVID-19 Vaccine Information can be found at: [email or web address] For questions related to vaccine distribution or appointments, please email [email or web address] or call [phone].     

## 2020-11-09 DIAGNOSIS — M79662 Pain in left lower leg: Secondary | ICD-10-CM | POA: Diagnosis not present

## 2020-11-09 DIAGNOSIS — M25511 Pain in right shoulder: Secondary | ICD-10-CM | POA: Diagnosis not present

## 2020-11-09 DIAGNOSIS — Z713 Dietary counseling and surveillance: Secondary | ICD-10-CM | POA: Diagnosis not present

## 2020-11-09 DIAGNOSIS — G894 Chronic pain syndrome: Secondary | ICD-10-CM | POA: Diagnosis not present

## 2020-11-09 DIAGNOSIS — G629 Polyneuropathy, unspecified: Secondary | ICD-10-CM | POA: Diagnosis not present

## 2020-11-09 DIAGNOSIS — M545 Low back pain, unspecified: Secondary | ICD-10-CM | POA: Diagnosis not present

## 2020-11-09 DIAGNOSIS — M5412 Radiculopathy, cervical region: Secondary | ICD-10-CM | POA: Diagnosis not present

## 2020-11-09 DIAGNOSIS — M25512 Pain in left shoulder: Secondary | ICD-10-CM | POA: Diagnosis not present

## 2020-11-09 DIAGNOSIS — M79661 Pain in right lower leg: Secondary | ICD-10-CM | POA: Diagnosis not present

## 2020-11-09 DIAGNOSIS — M542 Cervicalgia: Secondary | ICD-10-CM | POA: Diagnosis not present

## 2020-11-09 DIAGNOSIS — I10 Essential (primary) hypertension: Secondary | ICD-10-CM | POA: Diagnosis not present

## 2020-11-09 DIAGNOSIS — G909 Disorder of the autonomic nervous system, unspecified: Secondary | ICD-10-CM | POA: Diagnosis not present

## 2020-12-06 ENCOUNTER — Other Ambulatory Visit: Payer: Self-pay | Admitting: Obstetrics and Gynecology

## 2020-12-06 DIAGNOSIS — Z30011 Encounter for initial prescription of contraceptive pills: Secondary | ICD-10-CM

## 2020-12-06 DIAGNOSIS — N921 Excessive and frequent menstruation with irregular cycle: Secondary | ICD-10-CM

## 2020-12-06 NOTE — Telephone Encounter (Signed)
Patient needs appointment.

## 2020-12-27 ENCOUNTER — Ambulatory Visit (INDEPENDENT_AMBULATORY_CARE_PROVIDER_SITE_OTHER): Payer: Medicare Other | Admitting: Dermatology

## 2020-12-27 ENCOUNTER — Encounter: Payer: Self-pay | Admitting: Dermatology

## 2020-12-27 ENCOUNTER — Other Ambulatory Visit: Payer: Self-pay

## 2020-12-27 DIAGNOSIS — L700 Acne vulgaris: Secondary | ICD-10-CM | POA: Diagnosis not present

## 2020-12-27 DIAGNOSIS — L219 Seborrheic dermatitis, unspecified: Secondary | ICD-10-CM

## 2020-12-27 DIAGNOSIS — L930 Discoid lupus erythematosus: Secondary | ICD-10-CM

## 2020-12-27 DIAGNOSIS — K130 Diseases of lips: Secondary | ICD-10-CM | POA: Diagnosis not present

## 2020-12-27 MED ORDER — MOMETASONE FUROATE 0.1 % EX SOLN: Freq: Every day | CUTANEOUS | 3 refills | Status: DC

## 2020-12-27 MED ORDER — MUPIROCIN 2 % EX OINT: TOPICAL_OINTMENT | CUTANEOUS | 5 refills | Status: DC

## 2020-12-27 MED ORDER — TACROLIMUS 0.1 % EX OINT: 1.0000 "application " | TOPICAL_OINTMENT | Freq: Every day | CUTANEOUS | 3 refills | Status: DC

## 2020-12-27 MED ORDER — MOMETASONE FUROATE 0.1 % EX OINT: TOPICAL_OINTMENT | Freq: Every day | CUTANEOUS | 6 refills | Status: DC

## 2020-12-27 MED ORDER — KETOCONAZOLE 2 % EX SHAM: 1.0000 "application " | MEDICATED_SHAMPOO | CUTANEOUS | 11 refills | Status: DC

## 2020-12-27 NOTE — Patient Instructions (Addendum)
Restart Protopic (tacrolimus) ointment to corners of mouth as needed  Continue Mometasone ointment to active areas daily as needed and Mupirocin ointment to any open areas or sores of face  Aklief apply small amount at bedtime to affected areas of acne

## 2020-12-27 NOTE — Progress Notes (Signed)
   Follow-Up Visit   Subjective  Morgan Kent is a 40 y.o. female who presents for the following: Other (Discoid lupus follow up - Mometasone, Mupirocin and Ketoconazole shampoo - she is doing better than her last appointment but still has some flared areas).  The following portions of the chart were reviewed this encounter and updated as appropriate:   Tobacco  Allergies  Meds  Problems  Med Hx  Surg Hx  Fam Hx     Review of Systems:  No other skin or systemic complaints except as noted in HPI or Assessment and Plan.  Objective  Well appearing patient in no apparent distress; mood and affect are within normal limits.  A focused examination was performed including face. Relevant physical exam findings are noted in the Assessment and Plan.  Objective  Head - Anterior (Face): 1.5 cm crusted plaque of left preauricular. Macular dyspigmentation and scarring of face (see photo).  Images        Objective  Face: Comedones of temples   Assessment & Plan    Systemic lupus erythematosus with other organ involvement, unspecified SLE type  Controlled by Dr Jefm Bryant pt taking Plaquenil tablets.  Pt eye doctor says OK to  increase plaquenil tablets, pt will get approval from Dr Jefm Bryant to increase Plaquenil  Advised increasing Plaquenil will also help Discoid Lupus lesions.  Discoid lupus erythematosus -chronic and persistent with scarring and dyschromia-  flared Head - Anterior (Face) Flared Continue Mometasone ointment to active areas daily as needed and Mupirocin ointment to any open areas or sores of face and scalp  Reordered Medications mupirocin ointment (BACTROBAN) 2 % mometasone (ELOCON) 0.1 % ointment mometasone (ELOCON) 0.1 % lotion  Acne vulgaris -chronic persistent Face Comedones of the temples [place] apply small amount at bedtime to affected areas of acne  Angular cheilitis -chronic recurrent Bilateral Oral Commissure Restart Protopic (tacrolimus)  ointment to corners of mouth as needed May add ketoconazole cream if needed in the future.  Seborrheic dermatitis -scalp Currently better controlled Continue Ketocoanzole 2% shampoo 3 times per week Seborrheic Dermatitis  -  is a chronic persistent rash characterized by pinkness and scaling most commonly of the mid face but also can occur on the scalp (dandruff), ears; mid chest and mid back. It tends to be exacerbated by stress and cooler weather.  People who have neurologic disease may experience new onset or exacerbation of existing seborrheic dermatitis.  The condition is not curable but treatable and can be controlled.   Reordered Medications ketoconazole (NIZORAL) 2 % shampoo  Return in about 6 months (around 06/26/2021).   Documentation: I have reviewed the above documentation for accuracy and completeness, and I agree with the above.  Sarina Ser, MD

## 2021-01-30 DIAGNOSIS — M329 Systemic lupus erythematosus, unspecified: Secondary | ICD-10-CM | POA: Diagnosis not present

## 2021-01-30 DIAGNOSIS — M65331 Trigger finger, right middle finger: Secondary | ICD-10-CM | POA: Diagnosis not present

## 2021-01-30 DIAGNOSIS — I7300 Raynaud's syndrome without gangrene: Secondary | ICD-10-CM | POA: Diagnosis not present

## 2021-02-01 ENCOUNTER — Ambulatory Visit (INDEPENDENT_AMBULATORY_CARE_PROVIDER_SITE_OTHER): Payer: Medicare Other | Admitting: Hospice and Palliative Medicine

## 2021-02-01 ENCOUNTER — Encounter: Payer: Self-pay | Admitting: Hospice and Palliative Medicine

## 2021-02-01 ENCOUNTER — Other Ambulatory Visit: Payer: Self-pay

## 2021-02-01 VITALS — BP 138/80 | HR 100 | Temp 96.7°F | Resp 16 | Ht 66.0 in | Wt 229.4 lb

## 2021-02-01 DIAGNOSIS — R6889 Other general symptoms and signs: Secondary | ICD-10-CM

## 2021-02-01 DIAGNOSIS — Z3041 Encounter for surveillance of contraceptive pills: Secondary | ICD-10-CM

## 2021-02-01 DIAGNOSIS — M792 Neuralgia and neuritis, unspecified: Secondary | ICD-10-CM

## 2021-02-01 DIAGNOSIS — R5383 Other fatigue: Secondary | ICD-10-CM | POA: Diagnosis not present

## 2021-02-01 DIAGNOSIS — J309 Allergic rhinitis, unspecified: Secondary | ICD-10-CM

## 2021-02-01 DIAGNOSIS — M064 Inflammatory polyarthropathy: Secondary | ICD-10-CM

## 2021-02-01 DIAGNOSIS — E559 Vitamin D deficiency, unspecified: Secondary | ICD-10-CM | POA: Diagnosis not present

## 2021-02-01 DIAGNOSIS — D509 Iron deficiency anemia, unspecified: Secondary | ICD-10-CM

## 2021-02-01 DIAGNOSIS — J4520 Mild intermittent asthma, uncomplicated: Secondary | ICD-10-CM

## 2021-02-01 DIAGNOSIS — Z6837 Body mass index (BMI) 37.0-37.9, adult: Secondary | ICD-10-CM

## 2021-02-01 DIAGNOSIS — Z0001 Encounter for general adult medical examination with abnormal findings: Secondary | ICD-10-CM | POA: Diagnosis not present

## 2021-02-01 DIAGNOSIS — I10 Essential (primary) hypertension: Secondary | ICD-10-CM | POA: Diagnosis not present

## 2021-02-01 DIAGNOSIS — L930 Discoid lupus erythematosus: Secondary | ICD-10-CM

## 2021-02-01 MED ORDER — CYCLOBENZAPRINE HCL 10 MG PO TABS: 10.0000 mg | ORAL_TABLET | Freq: Three times a day (TID) | ORAL | 1 refills | Status: AC | PRN

## 2021-02-01 MED ORDER — MELOXICAM 7.5 MG PO TABS
7.5000 mg | ORAL_TABLET | Freq: Two times a day (BID) | ORAL | 1 refills | Status: DC
Start: 2021-02-01 — End: 2021-03-02

## 2021-02-01 MED ORDER — PULMICORT FLEXHALER 90 MCG/ACT IN AEPB: 2.0000 | INHALATION_SPRAY | Freq: Two times a day (BID) | RESPIRATORY_TRACT | 1 refills | Status: DC

## 2021-02-01 MED ORDER — NORETHINDRONE ACET-ETHINYL EST 1.5-30 MG-MCG PO TABS: 1.0000 | ORAL_TABLET | Freq: Every day | ORAL | 4 refills | Status: DC

## 2021-02-01 MED ORDER — FLUTICASONE PROPIONATE 50 MCG/ACT NA SUSP: 1.0000 | Freq: Every day | NASAL | 3 refills | Status: DC

## 2021-02-01 MED ORDER — GABAPENTIN 300 MG PO CAPS: 300.0000 mg | ORAL_CAPSULE | Freq: Three times a day (TID) | ORAL | 1 refills | Status: DC

## 2021-02-01 MED ORDER — MUPIROCIN 2 % EX OINT: TOPICAL_OINTMENT | CUTANEOUS | 5 refills | Status: DC

## 2021-02-01 NOTE — Progress Notes (Signed)
[facility] [address], [place] [address]  Internal MEDICINE  Office Visit Note  Patient Name: Morgan Kent  [id number]  [id number]  Date of Service: 02/02/2021  Chief Complaint  Patient presents with  . Follow-up    Weight loss, refill request  . Depression  . Hypertension  . Anxiety    HPI Patient is here for routine follow-up  History of Lupus, Reynauds--followed by rheumatology and dermatology Right middle finger in a brace for trigger finger, rheumatology administered injection Followed by cardiology for hypertension and tachycardia--stable, no recent medication changes No longer wearing CPAP--was told she no longer qualified for her CPAP?  Requesting refills on her mediations we prescribe  Due for routine fasting labs Wanting to discuss weight loss options  Current Medication: Outpatient Encounter Medications as of 02/01/2021  Medication Sig  . Norethindrone Acetate-Ethinyl Estradiol (AUROVELA 1.5/30) 1.5-30 MG-MCG tablet Take 1 tablet by mouth daily.  Marland Kitchen albuterol (VENTOLIN HFA) 108 (90 Base) MCG/ACT inhaler Inhale 2 puffs into the lungs daily.  Marland Kitchen amLODipine (NORVASC) 2.5 MG tablet Take 2.5 mg by mouth daily.  Rosalia Hammers FE 1.5/30 1.5-30 MG-MCG tablet TAKE 1 TABLET BY MOUTH AT BEDTIME  . Budesonide (PULMICORT FLEXHALER) 90 MCG/ACT inhaler Inhale 2 puffs into the lungs 2 (two) times daily.  . carvedilol (COREG) 25 MG tablet Take 1 tablet (25 mg total) by mouth 2 (two) times daily.  . cetirizine (ZYRTEC) 10 MG tablet Take 1 tablet (10 mg total) by mouth daily.  . clobetasol (TEMOVATE) 0.05 % external solution Apply 1 application topically 2 (two) times daily as needed. Apply to scalp  . cyclobenzaprine (FLEXERIL) 10 MG tablet Take 1 tablet (10 mg total) by mouth 3 (three) times daily as needed.  . diclofenac sodium (VOLTAREN) 1 % GEL Apply 1 application topically 2 (two) times daily as needed (for pain).  Marland Kitchen ergocalciferol (DRISDOL) 1.25 MG (50000  UT) capsule Take 1 capsule (50,000 Units total) by mouth once a week.  . ferrous sulfate (FERROUSUL) 325 (65 FE) MG tablet Take 1 tablet (325 mg total) by mouth daily with breakfast.  . fluticasone (FLONASE) 50 MCG/ACT nasal spray Place 1 spray into both nostrils at bedtime.  . gabapentin (NEURONTIN) 300 MG capsule Take 1 capsule (300 mg total) by mouth 3 (three) times daily.  . hydrochlorothiazide (HYDRODIURIL) 12.5 MG tablet Take 1 tablet (12.5 mg total) by mouth daily.  . hydroxychloroquine (PLAQUENIL) 200 MG tablet Take 200 mg by mouth daily.   Marland Kitchen ketoconazole (NIZORAL) 2 % shampoo Apply 1 application topically every 3 (three) days. Let sit 5 minutes before rinsing out  . lisinopril (ZESTRIL) 5 MG tablet Take 1 tablet (5 mg total) by mouth daily.  . meloxicam (MOBIC) 7.5 MG tablet Take 1 tablet (7.5 mg total) by mouth 2 (two) times daily.  . mometasone (ELOCON) 0.1 % lotion Apply topically daily. Qd to aa lupus in scalp prn flares  . mometasone (ELOCON) 0.1 % ointment Apply topically daily. Qd prn flares to lupus sores on the face  . montelukast (SINGULAIR) 10 MG tablet Take 1 tablet (10 mg total) by mouth at bedtime.  . mupirocin ointment (BACTROBAN) 2 % Apply to open area on skin qd-bid  . NARCAN 4 MG/0.1ML LIQD nasal spray kit SMARTSIG:1 Spray(s) Both Nares Once PRN  . oxyCODONE-acetaminophen (PERCOCET) 7.5-325 MG tablet Take 1 tablet by mouth every 4 (four) hours as needed for severe pain.  Vladimir Faster Glycol-Propyl Glycol (SYSTANE ULTRA) 0.4-0.3 % SOLN Place 1  drop into both eyes daily as needed (for dry eyes).   . tacrolimus (PROTOPIC) 0.1 % ointment Apply 1 application topically daily.  . [DISCONTINUED] Budesonide (PULMICORT FLEXHALER) 90 MCG/ACT inhaler Inhale 2 puffs into the lungs 2 (two) times daily.  . [DISCONTINUED] cyclobenzaprine (FLEXERIL) 10 MG tablet Take 10 mg by mouth 3 (three) times daily as needed.  . [DISCONTINUED] doxycycline (VIBRA-TABS) 100 MG tablet Take 100 mg by  mouth 2 (two) times daily.  . [DISCONTINUED] fluticasone (FLONASE) 50 MCG/ACT nasal spray Place 1 spray into both nostrils at bedtime.  . [DISCONTINUED] gabapentin (NEURONTIN) 300 MG capsule Take 1 capsule (300 mg total) by mouth 3 (three) times daily.  . [DISCONTINUED] meloxicam (MOBIC) 7.5 MG tablet Take 1 tablet (7.5 mg total) by mouth 2 (two) times daily.  . [DISCONTINUED] mometasone-formoterol (DULERA) 100-5 MCG/ACT AERO Inhale 2 puffs into the lungs 2 (two) times daily.  . [DISCONTINUED] mupirocin ointment (BACTROBAN) 2 % Apply to open area on skin qd-bid  . [DISCONTINUED] norethindrone (MICRONOR) 0.35 MG tablet Take 1 tablet (0.35 mg total) by mouth daily.   No facility-administered encounter medications on file as of 02/01/2021.    Surgical History: Past Surgical History:  Procedure Laterality Date  . carpel tunnel  Left 2014  . CRANIOTOMY  14,06   chiari  . DENTAL SURGERY    . HERNIA REPAIR     umbilical  . PLACEMENT OF LUMBAR DRAIN N/A 07/07/2014   Procedure: PLACEMENT OF LUMBAR DRAIN AND CLOSURE OF CERVICAL INCISION;  Surgeon: Newman Pies, MD;  Location: [facility];  Service: Neurosurgery;  Laterality: N/A;  . SUBOCCIPITAL CRANIECTOMY CERVICAL LAMINECTOMY N/A 09/29/2013   Procedure: SUBOCCIPITAL CRANIECTOMY CERVICAL LAMINECTOMY/DURAPLASTY;  Surgeon: Ophelia Charter, MD;  Location: [facility];  Service: Neurosurgery;  Laterality: N/A;  posterior  . SUBOCCIPITAL CRANIECTOMY CERVICAL LAMINECTOMY N/A 06/23/2014   Procedure: SUBOCCIPITAL CRANIECTOMY CERVICAL LAMINECTOMY/DURAPLASTY;  Surgeon: Newman Pies, MD;  Location: [facility];  Service: Neurosurgery;  Laterality: N/A;  suboccipital craniectomy with cervical laminectomy and duraplasty    Medical History: Past Medical History:  Diagnosis Date  . Anxiety   . Arthritis    rheumo possible  . Chiari malformation   . Depression   . Dizziness   . Headache(784.0)   . History of kidney stones   . Hypertension   .  Lupus ([place])   . Lupus ([place])   . Nerve damage    NECK  . PONV (postoperative nausea and vomiting)    nausea only  . TMJ arthritis     Family History: Family History  Problem Relation Age of Onset  . Heart disease Father   . Hypertension Father   . Hypertension Mother   . Thyroid disease Mother   . Asthma Other   . Depression Other   . Diabetes Other   . Migraines Other   . Stroke Other     Social History   Socioeconomic History  . Marital status: Single    Spouse name: Not on file  . Number of children: Not on file  . Years of education: Not on file  . Highest education level: Not on file  Occupational History  . Not on file  Tobacco Use  . Smoking status: Never Smoker  . Smokeless tobacco: Never Used  Vaping Use  . Vaping Use: Never used  Substance and Sexual Activity  . Alcohol use: No    Alcohol/week: 0.0 standard drinks  . Drug use: No  . Sexual activity: Not  on file  Other Topics Concern  . Not on file  Social History Narrative   Lives with 3 children in 2 story home.  Unemployed.  Trying to get disability.  Education: high school.   Social Determinants of Health   Financial Resource Strain: Not on file  Food Insecurity: Not on file  Transportation Needs: Not on file  Physical Activity: Not on file  Stress: Not on file  Social Connections: Not on file  Intimate Partner Violence: Not on file      Review of Systems  Constitutional: Negative for chills, diaphoresis and fatigue.  HENT: Negative for ear pain, postnasal drip and sinus pressure.   Eyes: Negative for photophobia, discharge, redness, itching and visual disturbance.  Respiratory: Negative for cough, shortness of breath and wheezing.   Cardiovascular: Negative for chest pain, palpitations and leg swelling.  Gastrointestinal: Negative for abdominal pain, constipation, diarrhea, nausea and vomiting.  Genitourinary: Negative for dysuria and flank pain.  Musculoskeletal: Negative for  arthralgias, back pain, gait problem and neck pain.  Skin: Negative for color change.  Allergic/Immunologic: Negative for environmental allergies and food allergies.  Neurological: Negative for dizziness and headaches.  Hematological: Does not bruise/bleed easily.  Psychiatric/Behavioral: Negative for agitation, behavioral problems (depression) and hallucinations.    Vital Signs: BP 138/80   Pulse 100   Temp (!) 96.7 F (35.9 C)   Resp 16   Ht 5' 6" (1.676 m)   Wt 229 lb 6.4 oz (104.1 kg)   SpO2 98%   BMI 37.03 kg/m    Physical Exam Vitals reviewed.  Constitutional:      Appearance: Normal appearance. She is obese.  Cardiovascular:     Rate and Rhythm: Normal rate and regular rhythm.     Pulses: Normal pulses.     Heart sounds: Normal heart sounds.  Pulmonary:     Effort: Pulmonary effort is normal.     Breath sounds: Normal breath sounds.  Abdominal:     General: Abdomen is flat.     Palpations: Abdomen is soft.  Musculoskeletal:        General: Normal range of motion.     Cervical back: Normal range of motion.  Skin:    General: Skin is warm.  Neurological:     General: No focal deficit present.     Mental Status: She is alert and oriented to person, place, and time. Mental status is at baseline.  Psychiatric:        Mood and Affect: Mood normal.        Behavior: Behavior normal.        Thought Content: Thought content normal.        Judgment: Judgment normal.    Assessment/Plan: 1. Class 2 severe obesity due to excess calories with serious comorbidity and body mass index (BMI) of 37.0 to 37.9 in adult (HCC) BMI 37.0, comorbid conditions include HTN and asthma Review updated thyroid levels Has been on phentermine in the past--most likely will avoid future use due to HTN and tachycardia, will discuss alternative therapies Will need metabolic testing - TSH + free T4  2. Discoid lupus erythematosus Followed by rheumatology and dermatology, requesting  refills - mupirocin ointment (BACTROBAN) 2 %; Apply to open area on skin qd-bid  Dispense: 22 g; Refill: 5  3. Mild intermittent asthma without complication Stable, requesting refills Will need PFT - Budesonide (PULMICORT FLEXHALER) 90 MCG/ACT inhaler; Inhale 2 puffs into the lungs 2 (two) times daily.  Dispense: 3 each; Refill:  1  4. Allergic rhinitis, unspecified seasonality, unspecified trigger Stable, requesting refills - fluticasone (FLONASE) 50 MCG/ACT nasal spray; Place 1 spray into both nostrils at bedtime.  Dispense: 48 g; Refill: 3  5. Neuralgia Stable, requesting refills - gabapentin (NEURONTIN) 300 MG capsule; Take 1 capsule (300 mg total) by mouth 3 (three) times daily.  Dispense: 270 capsule; Refill: 1 - cyclobenzaprine (FLEXERIL) 10 MG tablet; Take 1 tablet (10 mg total) by mouth 3 (three) times daily as needed.  Dispense: 30 tablet; Refill: 1  6. Inflammatory polyarthritis (HCC) Stable, requesting refills - meloxicam (MOBIC) 7.5 MG tablet; Take 1 tablet (7.5 mg total) by mouth 2 (two) times daily.  Dispense: 180 tablet; Refill: 1  7. Iron deficiency anemia, unspecified iron deficiency anemia type - Vitamin B12 - Folate - Iron and TIBC - Ferritin  8. Vitamin D deficiency Will review updated levels and adjust therapy as indicated - Vitamin D (25 hydroxy)  9. Essential hypertension BP and HR well controlled, continue to monitor - amLODipine (NORVASC) 2.5 MG tablet; Take 2.5 mg by mouth daily. - Comprehensive Metabolic Panel (CMET)  10. Encounter for surveillance of contraceptive pills Requesting refills of current birth control, has been seen by GYN whom recommended IUD for birth control but she refused - Norethindrone Acetate-Ethinyl Estradiol (AUROVELA 1.5/30) 1.5-30 MG-MCG tablet; Take 1 tablet by mouth daily.  Dispense: 28 tablet; Refill: 4  11. Other general symptoms and signs  - Vitamin B12 - Folate  12. Other fatigue - CBC w/Diff/Platelet -  Comprehensive Metabolic Panel (CMET) - Lipid Panel With LDL/HDL Ratio - TSH + free T4 - Vitamin D (25 hydroxy) - Vitamin B12 - Folate - Iron and TIBC - Ferritin  General Counseling: Kiyara verbalizes understanding of the findings of todays visit and agrees with plan of treatment. I have discussed any further diagnostic evaluation that may be needed or ordered today. We also reviewed her medications today. she has been encouraged to call the office with any questions or concerns that should arise related to todays visit.    Orders Placed This Encounter  Procedures  . CBC w/Diff/Platelet  . Comprehensive Metabolic Panel (CMET)  . Lipid Panel With LDL/HDL Ratio  . TSH + free T4  . Vitamin D (25 hydroxy)  . Vitamin B12  . Folate  . Iron and TIBC  . Ferritin    Meds ordered this encounter  Medications  . Norethindrone Acetate-Ethinyl Estradiol (AUROVELA 1.5/30) 1.5-30 MG-MCG tablet    Sig: Take 1 tablet by mouth daily.    Dispense:  28 tablet    Refill:  4  . Budesonide (PULMICORT FLEXHALER) 90 MCG/ACT inhaler    Sig: Inhale 2 puffs into the lungs 2 (two) times daily.    Dispense:  3 each    Refill:  1  . fluticasone (FLONASE) 50 MCG/ACT nasal spray    Sig: Place 1 spray into both nostrils at bedtime.    Dispense:  48 g    Refill:  3  . gabapentin (NEURONTIN) 300 MG capsule    Sig: Take 1 capsule (300 mg total) by mouth 3 (three) times daily.    Dispense:  270 capsule    Refill:  1  . meloxicam (MOBIC) 7.5 MG tablet    Sig: Take 1 tablet (7.5 mg total) by mouth 2 (two) times daily.    Dispense:  180 tablet    Refill:  1  . mupirocin ointment (BACTROBAN) 2 %    Sig: Apply to open area  on skin qd-bid    Dispense:  22 g    Refill:  5  . cyclobenzaprine (FLEXERIL) 10 MG tablet    Sig: Take 1 tablet (10 mg total) by mouth 3 (three) times daily as needed.    Dispense:  30 tablet    Refill:  1    Time spent: 30 Minutes Time spent includes review of chart, medications,  test results and follow-up plan with the patient.  This patient was seen by Theodoro Grist AGNP-C in Collaboration with Dr Lavera Guise as a part of collaborative care agreement     Tanna Furry. Harris AGNP-C Internal medicine

## 2021-02-02 ENCOUNTER — Encounter: Payer: Self-pay | Admitting: Hospice and Palliative Medicine

## 2021-02-22 ENCOUNTER — Other Ambulatory Visit: Payer: Self-pay | Admitting: Nurse Practitioner

## 2021-02-22 DIAGNOSIS — E559 Vitamin D deficiency, unspecified: Secondary | ICD-10-CM

## 2021-02-26 DIAGNOSIS — R6889 Other general symptoms and signs: Secondary | ICD-10-CM | POA: Diagnosis not present

## 2021-02-26 DIAGNOSIS — E559 Vitamin D deficiency, unspecified: Secondary | ICD-10-CM | POA: Diagnosis not present

## 2021-02-26 DIAGNOSIS — R5383 Other fatigue: Secondary | ICD-10-CM | POA: Diagnosis not present

## 2021-02-26 DIAGNOSIS — E782 Mixed hyperlipidemia: Secondary | ICD-10-CM | POA: Diagnosis not present

## 2021-02-26 DIAGNOSIS — D509 Iron deficiency anemia, unspecified: Secondary | ICD-10-CM | POA: Diagnosis not present

## 2021-02-27 LAB — CBC WITH DIFFERENTIAL/PLATELET
Basophils Absolute: 0 10*3/uL (ref 0.0–0.2)
Basos: 0 %
EOS (ABSOLUTE): 0.1 10*3/uL (ref 0.0–0.4)
Eos: 3 %
Hematocrit: 34 % (ref 34.0–46.6)
Hemoglobin: 11.1 g/dL (ref 11.1–15.9)
Immature Grans (Abs): 0 10*3/uL (ref 0.0–0.1)
Immature Granulocytes: 0 %
Lymphocytes Absolute: 1.3 10*3/uL (ref 0.7–3.1)
Lymphs: 28 %
MCH: 30.2 pg (ref 26.6–33.0)
MCHC: 32.6 g/dL (ref 31.5–35.7)
MCV: 93 fL (ref 79–97)
Monocytes Absolute: 0.5 10*3/uL (ref 0.1–0.9)
Monocytes: 10 %
Neutrophils Absolute: 2.8 10*3/uL (ref 1.4–7.0)
Neutrophils: 59 %
Platelets: 293 10*3/uL (ref 150–450)
RBC: 3.67 x10E6/uL — ABNORMAL LOW (ref 3.77–5.28)
RDW: 14.1 % (ref 11.7–15.4)
WBC: 4.8 10*3/uL (ref 3.4–10.8)

## 2021-02-27 LAB — TSH+FREE T4
Free T4: 1.32 ng/dL (ref 0.82–1.77)
TSH: 0.681 u[IU]/mL (ref 0.450–4.500)

## 2021-02-27 LAB — LIPID PANEL WITH LDL/HDL RATIO
Cholesterol, Total: 162 mg/dL (ref 100–199)
HDL: 37 mg/dL — ABNORMAL LOW (ref >39)
LDL Chol Calc (NIH): 85 mg/dL (ref 0–99)
LDL/HDL Ratio: 2.3 ratio (ref 0.0–3.2)
Triglycerides: 242 mg/dL — ABNORMAL HIGH (ref 0–149)
VLDL Cholesterol Cal: 40 mg/dL (ref 5–40)

## 2021-02-27 LAB — IRON AND TIBC
Iron Saturation: 16 % (ref 15–55)
Iron: 44 ug/dL (ref 27–159)
Total Iron Binding Capacity: 275 ug/dL (ref 250–450)
UIBC: 231 ug/dL (ref 131–425)

## 2021-02-27 LAB — COMPREHENSIVE METABOLIC PANEL
ALT: 9 IU/L (ref 0–32)
AST: 14 IU/L (ref 0–40)
Albumin/Globulin Ratio: 1.2 (ref 1.2–2.2)
Albumin: 4.1 g/dL (ref 3.8–4.8)
Alkaline Phosphatase: 53 IU/L (ref 44–121)
BUN/Creatinine Ratio: 11 (ref 9–23)
BUN: 10 mg/dL (ref 6–24)
Bilirubin Total: 0.2 mg/dL (ref 0.0–1.2)
CO2: 20 mmol/L (ref 20–29)
Calcium: 9.5 mg/dL (ref 8.7–10.2)
Chloride: 98 mmol/L (ref 96–106)
Creatinine, Ser: 0.91 mg/dL (ref 0.57–1.00)
Globulin, Total: 3.4 g/dL (ref 1.5–4.5)
Glucose: 92 mg/dL (ref 65–99)
Potassium: 4.8 mmol/L (ref 3.5–5.2)
Sodium: 136 mmol/L (ref 134–144)
Total Protein: 7.5 g/dL (ref 6.0–8.5)
eGFR: 82 mL/min/{1.73_m2} (ref >59)

## 2021-02-27 LAB — VITAMIN B12: Vitamin B-12: 176 pg/mL — ABNORMAL LOW (ref 232–1245)

## 2021-02-27 LAB — VITAMIN D 25 HYDROXY (VIT D DEFICIENCY, FRACTURES): Vit D, 25-Hydroxy: 44.7 ng/mL (ref 30.0–100.0)

## 2021-02-27 LAB — FOLATE: Folate: 14.9 ng/mL (ref >3.0)

## 2021-02-27 LAB — FERRITIN: Ferritin: 215 ng/mL — ABNORMAL HIGH (ref 15–150)

## 2021-02-27 NOTE — Progress Notes (Signed)
Labs reviewed, will be discussed at next visit. Will address abnormalities.

## 2021-02-28 ENCOUNTER — Other Ambulatory Visit: Payer: Self-pay | Admitting: Hospice and Palliative Medicine

## 2021-02-28 DIAGNOSIS — Z3041 Encounter for surveillance of contraceptive pills: Secondary | ICD-10-CM

## 2021-03-02 ENCOUNTER — Other Ambulatory Visit: Payer: Self-pay

## 2021-03-02 ENCOUNTER — Telehealth: Payer: Self-pay

## 2021-03-02 ENCOUNTER — Encounter: Payer: Self-pay | Admitting: Hospice and Palliative Medicine

## 2021-03-02 ENCOUNTER — Ambulatory Visit (INDEPENDENT_AMBULATORY_CARE_PROVIDER_SITE_OTHER): Payer: Medicare Other | Admitting: Hospice and Palliative Medicine

## 2021-03-02 VITALS — BP 123/80 | HR 87 | Temp 98.3°F | Resp 16 | Wt 236.4 lb

## 2021-03-02 DIAGNOSIS — M064 Inflammatory polyarthropathy: Secondary | ICD-10-CM

## 2021-03-02 DIAGNOSIS — Z6838 Body mass index (BMI) 38.0-38.9, adult: Secondary | ICD-10-CM | POA: Diagnosis not present

## 2021-03-02 DIAGNOSIS — E538 Deficiency of other specified B group vitamins: Secondary | ICD-10-CM | POA: Diagnosis not present

## 2021-03-02 DIAGNOSIS — J4520 Mild intermittent asthma, uncomplicated: Secondary | ICD-10-CM

## 2021-03-02 DIAGNOSIS — R7989 Other specified abnormal findings of blood chemistry: Secondary | ICD-10-CM | POA: Diagnosis not present

## 2021-03-02 MED ORDER — ALBUTEROL SULFATE HFA 108 (90 BASE) MCG/ACT IN AERS: 2.0000 | INHALATION_SPRAY | Freq: Every day | RESPIRATORY_TRACT | 5 refills | Status: DC

## 2021-03-02 MED ORDER — MELOXICAM 7.5 MG PO TABS: 7.5000 mg | ORAL_TABLET | Freq: Two times a day (BID) | ORAL | 1 refills | Status: DC

## 2021-03-02 MED ORDER — DICLOFENAC SODIUM 1 % EX GEL: 2.0000 g | Freq: Four times a day (QID) | CUTANEOUS | 2 refills | Status: DC

## 2021-03-02 MED ORDER — MONTELUKAST SODIUM 10 MG PO TABS
10.0000 mg | ORAL_TABLET | Freq: Every day | ORAL | 3 refills | Status: DC
Start: 2021-03-02 — End: 2022-06-13

## 2021-03-02 NOTE — Telephone Encounter (Signed)
Pt advised we put order for pulse ox

## 2021-03-02 NOTE — Telephone Encounter (Signed)
Send message to [facility] for overnight oximetry order that we put in epic

## 2021-03-02 NOTE — Progress Notes (Signed)
[facility] [place], [place] [address]  Internal MEDICINE  Office Visit Note  Patient Name: Morgan Kent  [id number]  [id number]  Date of Service: 03/11/2021  Chief Complaint  Patient presents with  . Allergic Reaction  . Anxiety  . Depression  . Hypertension  . Follow-up    Weight loss    HPI Patient is here for routine follow-up Reviewed most recent labs--abnormal lipid panel, slightly elevated ferritin, vitamin B12 deficiency--all others stable Wanting to discuss weight loss options Again reports that in the past she has worn either oxygen or a CPAP but supplies were picked up due to her no longer qualifying She says she has had both PSG and ONO--will need to review results, no documentation in current medical record   Current Medication: Outpatient Encounter Medications as of 03/02/2021  Medication Sig  . amLODipine (NORVASC) 2.5 MG tablet Take 2.5 mg by mouth daily.  Rosalia Hammers FE 1.5/30 1.5-30 MG-MCG tablet TAKE 1 TABLET BY MOUTH AT BEDTIME  . Budesonide (PULMICORT FLEXHALER) 90 MCG/ACT inhaler Inhale 2 puffs into the lungs 2 (two) times daily.  . carvedilol (COREG) 25 MG tablet Take 1 tablet (25 mg total) by mouth 2 (two) times daily.  . cetirizine (ZYRTEC) 10 MG tablet Take 1 tablet (10 mg total) by mouth daily.  . clobetasol (TEMOVATE) 0.05 % external solution Apply 1 application topically 2 (two) times daily as needed. Apply to scalp  . cyclobenzaprine (FLEXERIL) 10 MG tablet Take 1 tablet (10 mg total) by mouth 3 (three) times daily as needed.  . diclofenac Sodium (VOLTAREN) 1 % GEL Apply 2 g topically 4 (four) times daily.  . ergocalciferol (DRISDOL) 1.25 MG (50000 UT) capsule Take 1 capsule (50,000 Units total) by mouth once a week.  . ferrous sulfate (FERROUSUL) 325 (65 FE) MG tablet Take 1 tablet (325 mg total) by mouth daily with breakfast.  . fluticasone (FLONASE) 50 MCG/ACT nasal spray Place 1 spray into both nostrils at bedtime.  .  gabapentin (NEURONTIN) 300 MG capsule Take 1 capsule (300 mg total) by mouth 3 (three) times daily.  . hydrochlorothiazide (HYDRODIURIL) 12.5 MG tablet Take 1 tablet (12.5 mg total) by mouth daily.  . hydroxychloroquine (PLAQUENIL) 200 MG tablet Take 200 mg by mouth daily.   Marland Kitchen ketoconazole (NIZORAL) 2 % shampoo Apply 1 application topically every 3 (three) days. Let sit 5 minutes before rinsing out  . lisinopril (ZESTRIL) 5 MG tablet Take 1 tablet (5 mg total) by mouth daily.  . mometasone (ELOCON) 0.1 % lotion Apply topically daily. Qd to aa lupus in scalp prn flares  . mometasone (ELOCON) 0.1 % ointment Apply topically daily. Qd prn flares to lupus sores on the face  . mupirocin ointment (BACTROBAN) 2 % Apply to open area on skin qd-bid  . NARCAN 4 MG/0.1ML LIQD nasal spray kit SMARTSIG:1 Spray(s) Both Nares Once PRN  . Norethindrone Acetate-Ethinyl Estradiol (AUROVELA 1.5/30) 1.5-30 MG-MCG tablet Take 1 tablet by mouth daily.  Marland Kitchen oxyCODONE-acetaminophen (PERCOCET) 7.5-325 MG tablet Take 1 tablet by mouth every 4 (four) hours as needed for severe pain.  Vladimir Faster Glycol-Propyl Glycol (SYSTANE ULTRA) 0.4-0.3 % SOLN Place 1 drop into both eyes daily as needed (for dry eyes).   . tacrolimus (PROTOPIC) 0.1 % ointment Apply 1 application topically daily.  . [DISCONTINUED] albuterol (VENTOLIN HFA) 108 (90 Base) MCG/ACT inhaler Inhale 2 puffs into the lungs daily.  . [DISCONTINUED] diclofenac sodium (VOLTAREN) 1 % GEL Apply 1 application topically  2 (two) times daily as needed (for pain).  . [DISCONTINUED] meloxicam (MOBIC) 7.5 MG tablet Take 1 tablet (7.5 mg total) by mouth 2 (two) times daily.  . [DISCONTINUED] montelukast (SINGULAIR) 10 MG tablet Take 1 tablet (10 mg total) by mouth at bedtime.  Marland Kitchen albuterol (VENTOLIN HFA) 108 (90 Base) MCG/ACT inhaler Inhale 2 puffs into the lungs daily.  . meloxicam (MOBIC) 7.5 MG tablet Take 1 tablet (7.5 mg total) by mouth 2 (two) times daily.  . montelukast  (SINGULAIR) 10 MG tablet Take 1 tablet (10 mg total) by mouth at bedtime.   No facility-administered encounter medications on file as of 03/02/2021.    Surgical History: Past Surgical History:  Procedure Laterality Date  . carpel tunnel  Left 2014  . CRANIOTOMY  14,06   chiari  . DENTAL SURGERY    . HERNIA REPAIR     umbilical  . PLACEMENT OF LUMBAR DRAIN N/A 07/07/2014   Procedure: PLACEMENT OF LUMBAR DRAIN AND CLOSURE OF CERVICAL INCISION;  Surgeon: Newman Pies, MD;  Location: [facility];  Service: Neurosurgery;  Laterality: N/A;  . SUBOCCIPITAL CRANIECTOMY CERVICAL LAMINECTOMY N/A 09/29/2013   Procedure: SUBOCCIPITAL CRANIECTOMY CERVICAL LAMINECTOMY/DURAPLASTY;  Surgeon: Ophelia Charter, MD;  Location: [facility];  Service: Neurosurgery;  Laterality: N/A;  posterior  . SUBOCCIPITAL CRANIECTOMY CERVICAL LAMINECTOMY N/A 06/23/2014   Procedure: SUBOCCIPITAL CRANIECTOMY CERVICAL LAMINECTOMY/DURAPLASTY;  Surgeon: Newman Pies, MD;  Location: [facility];  Service: Neurosurgery;  Laterality: N/A;  suboccipital craniectomy with cervical laminectomy and duraplasty    Medical History: Past Medical History:  Diagnosis Date  . Anxiety   . Arthritis    rheumo possible  . Chiari malformation   . Depression   . Dizziness   . Headache(784.0)   . History of kidney stones   . Hypertension   . Lupus ([place])   . Lupus ([place])   . Nerve damage    NECK  . PONV (postoperative nausea and vomiting)    nausea only  . TMJ arthritis     Family History: Family History  Problem Relation Age of Onset  . Heart disease Father   . Hypertension Father   . Hypertension Mother   . Thyroid disease Mother   . Asthma Other   . Depression Other   . Diabetes Other   . Migraines Other   . Stroke Other     Social History   Socioeconomic History  . Marital status: Single    Spouse name: Not on file  . Number of children: Not on file  . Years of education: Not on file  . Highest  education level: Not on file  Occupational History  . Not on file  Tobacco Use  . Smoking status: Never Smoker  . Smokeless tobacco: Never Used  Vaping Use  . Vaping Use: Never used  Substance and Sexual Activity  . Alcohol use: No    Alcohol/week: 0.0 standard drinks  . Drug use: No  . Sexual activity: Not on file  Other Topics Concern  . Not on file  Social History Narrative   Lives with 3 children in 2 story home.  Unemployed.  Trying to get disability.  Education: high school.   Social Determinants of Health   Financial Resource Strain: Not on file  Food Insecurity: Not on file  Transportation Needs: Not on file  Physical Activity: Not on file  Stress: Not on file  Social Connections: Not on file  Intimate Partner Violence: Not on file  Review of Systems  Constitutional: Negative for chills, diaphoresis and fatigue.  HENT: Negative for ear pain, postnasal drip and sinus pressure.   Eyes: Negative for photophobia, discharge, redness, itching and visual disturbance.  Respiratory: Negative for cough, shortness of breath and wheezing.   Cardiovascular: Negative for chest pain, palpitations and leg swelling.  Gastrointestinal: Negative for abdominal pain, constipation, diarrhea, nausea and vomiting.  Genitourinary: Negative for dysuria and flank pain.  Musculoskeletal: Positive for arthralgias, back pain and joint swelling. Negative for gait problem and neck pain.  Skin: Negative for color change.  Allergic/Immunologic: Negative for environmental allergies and food allergies.  Neurological: Negative for dizziness and headaches.  Hematological: Does not bruise/bleed easily.  Psychiatric/Behavioral: Negative for agitation, behavioral problems (depression) and hallucinations.    Vital Signs: BP 123/80   Pulse 87   Temp 98.3 F (36.8 C)   Resp 16   Wt 236 lb 6.4 oz (107.2 kg)   SpO2 94%   BMI 38.16 kg/m    Physical Exam Vitals reviewed.  Constitutional:       Appearance: Normal appearance. She is obese.  Cardiovascular:     Rate and Rhythm: Normal rate and regular rhythm.     Pulses: Normal pulses.     Heart sounds: Normal heart sounds.  Pulmonary:     Effort: Pulmonary effort is normal.     Breath sounds: Normal breath sounds.  Abdominal:     General: Abdomen is flat.     Palpations: Abdomen is soft.  Musculoskeletal:        General: Normal range of motion.     Cervical back: Normal range of motion.  Skin:    General: Skin is warm.  Neurological:     General: No focal deficit present.     Mental Status: She is alert and oriented to person, place, and time. Mental status is at baseline.  Psychiatric:        Mood and Affect: Mood normal.        Behavior: Behavior normal.        Thought Content: Thought content normal.        Judgment: Judgment normal.    Assessment/Plan: 1. Elevated ferritin Will update ONO to monitor for nocturnal hypoxia possibly causing elevated ferritin levels - Pulse oximetry, overnight; Future  2. BMI 38.0-38.9,adult Increased metabolism Discussed weight loss options--calorie deficiency, foods to avoid or to consume in moderation, resources provided for 1800 calorie diet plans Obesity Counseling: Risk Assessment: An assessment of behavioral risk factors was made today and includes lack of exercise sedentary lifestyle, lack of portion control and poor dietary habits.  Risk Modification Advice: She was counseled on portion control guidelines. Restricting daily caloric intake to 1800. The detrimental long term effects of obesity on her health and ongoing poor compliance was also discussed with the patient. - Metabolic Test  3. Mild intermittent asthma without complication Breathing remains stable, requesting refills - albuterol (VENTOLIN HFA) 108 (90 Base) MCG/ACT inhaler; Inhale 2 puffs into the lungs daily.  Dispense: 18 g; Refill: 5 - montelukast (SINGULAIR) 10 MG tablet; Take 1 tablet (10 mg total) by  mouth at bedtime.  Dispense: 90 tablet; Refill: 3  4. Inflammatory polyarthritis ([place]) Symptoms remain well controlled, requesting refills - diclofenac Sodium (VOLTAREN) 1 % GEL; Apply 2 g topically 4 (four) times daily.  Dispense: 350 g; Refill: 2 - meloxicam (MOBIC) 7.5 MG tablet; Take 1 tablet (7.5 mg total) by mouth 2 (two) times daily.  Dispense: 180 tablet; Refill:  1  5. Vitamin B12 deficiency Will get set up on weekly injections X3, followed by monthly injections  General Counseling: Sueann verbalizes understanding of the findings of todays visit and agrees with plan of treatment. I have discussed any further diagnostic evaluation that may be needed or ordered today. We also reviewed her medications today. she has been encouraged to call the office with any questions or concerns that should arise related to todays visit.    Orders Placed This Encounter  Procedures  . Metabolic Test  . Pulse oximetry, overnight    Meds ordered this encounter  Medications  . albuterol (VENTOLIN HFA) 108 (90 Base) MCG/ACT inhaler    Sig: Inhale 2 puffs into the lungs daily.    Dispense:  18 g    Refill:  5  . diclofenac Sodium (VOLTAREN) 1 % GEL    Sig: Apply 2 g topically 4 (four) times daily.    Dispense:  350 g    Refill:  2  . meloxicam (MOBIC) 7.5 MG tablet    Sig: Take 1 tablet (7.5 mg total) by mouth 2 (two) times daily.    Dispense:  180 tablet    Refill:  1  . montelukast (SINGULAIR) 10 MG tablet    Sig: Take 1 tablet (10 mg total) by mouth at bedtime.    Dispense:  90 tablet    Refill:  3    Time spent: 30 Minutes Time spent includes review of chart, medications, test results and follow-up plan with the patient.  This patient was seen by Theodoro Grist AGNP-C in Collaboration with Dr Lavera Guise as a part of collaborative care agreement     Tanna Furry. Cattleya Dobratz AGNP-C Internal medicine

## 2021-03-07 DIAGNOSIS — R0902 Hypoxemia: Secondary | ICD-10-CM | POA: Diagnosis not present

## 2021-03-09 ENCOUNTER — Other Ambulatory Visit: Payer: Self-pay

## 2021-03-09 ENCOUNTER — Other Ambulatory Visit: Payer: Self-pay | Admitting: Hospice and Palliative Medicine

## 2021-03-09 ENCOUNTER — Ambulatory Visit (INDEPENDENT_AMBULATORY_CARE_PROVIDER_SITE_OTHER): Payer: Medicare Other

## 2021-03-09 DIAGNOSIS — E538 Deficiency of other specified B group vitamins: Secondary | ICD-10-CM

## 2021-03-09 DIAGNOSIS — R7989 Other specified abnormal findings of blood chemistry: Secondary | ICD-10-CM

## 2021-03-09 MED ORDER — CYANOCOBALAMIN 1000 MCG/ML IJ SOLN
1000.0000 ug | Freq: Once | INTRAMUSCULAR | Status: AC
  Administered 2021-03-09: 1000 ug via INTRAMUSCULAR

## 2021-03-11 ENCOUNTER — Encounter: Payer: Self-pay | Admitting: Hospice and Palliative Medicine

## 2021-03-16 ENCOUNTER — Other Ambulatory Visit: Payer: Self-pay

## 2021-03-16 ENCOUNTER — Other Ambulatory Visit: Payer: Self-pay | Admitting: Hospice and Palliative Medicine

## 2021-03-16 ENCOUNTER — Ambulatory Visit (INDEPENDENT_AMBULATORY_CARE_PROVIDER_SITE_OTHER): Payer: Medicare Other

## 2021-03-16 DIAGNOSIS — E538 Deficiency of other specified B group vitamins: Secondary | ICD-10-CM | POA: Diagnosis not present

## 2021-03-16 MED ORDER — CYANOCOBALAMIN 1000 MCG/ML IJ SOLN
1000.0000 ug | Freq: Once | INTRAMUSCULAR | Status: AC
  Administered 2021-03-16: 1000 ug via INTRAMUSCULAR

## 2021-03-18 ENCOUNTER — Other Ambulatory Visit: Payer: Self-pay | Admitting: Dermatology

## 2021-03-18 DIAGNOSIS — L930 Discoid lupus erythematosus: Secondary | ICD-10-CM

## 2021-03-22 ENCOUNTER — Encounter: Payer: Self-pay | Admitting: Hospice and Palliative Medicine

## 2021-03-23 ENCOUNTER — Other Ambulatory Visit: Payer: Self-pay

## 2021-03-23 ENCOUNTER — Ambulatory Visit (INDEPENDENT_AMBULATORY_CARE_PROVIDER_SITE_OTHER): Payer: Medicare Other

## 2021-03-23 DIAGNOSIS — E538 Deficiency of other specified B group vitamins: Secondary | ICD-10-CM

## 2021-03-26 MED ORDER — CYANOCOBALAMIN 1000 MCG/ML IJ SOLN
1000.0000 ug | Freq: Once | INTRAMUSCULAR | Status: AC
  Administered 2021-03-23: 1000 ug via INTRAMUSCULAR

## 2021-04-17 DIAGNOSIS — G935 Compression of brain: Secondary | ICD-10-CM | POA: Diagnosis not present

## 2021-04-23 ENCOUNTER — Other Ambulatory Visit: Payer: Self-pay

## 2021-04-23 ENCOUNTER — Ambulatory Visit (INDEPENDENT_AMBULATORY_CARE_PROVIDER_SITE_OTHER): Payer: Medicare Other

## 2021-04-23 DIAGNOSIS — E538 Deficiency of other specified B group vitamins: Secondary | ICD-10-CM

## 2021-04-23 MED ORDER — CYANOCOBALAMIN 1000 MCG/ML IJ SOLN
1000.0000 ug | Freq: Once | INTRAMUSCULAR | Status: AC
  Administered 2021-04-23: 1000 ug via INTRAMUSCULAR

## 2021-04-24 ENCOUNTER — Other Ambulatory Visit: Payer: Self-pay | Admitting: Dermatology

## 2021-04-27 DIAGNOSIS — M329 Systemic lupus erythematosus, unspecified: Secondary | ICD-10-CM | POA: Diagnosis not present

## 2021-04-27 DIAGNOSIS — Z79899 Other long term (current) drug therapy: Secondary | ICD-10-CM | POA: Diagnosis not present

## 2021-05-07 ENCOUNTER — Other Ambulatory Visit: Payer: Self-pay

## 2021-05-07 DIAGNOSIS — M792 Neuralgia and neuritis, unspecified: Secondary | ICD-10-CM

## 2021-05-07 MED ORDER — GABAPENTIN 300 MG PO CAPS: 300.0000 mg | ORAL_CAPSULE | Freq: Three times a day (TID) | ORAL | 0 refills | Status: DC

## 2021-05-09 DIAGNOSIS — M329 Systemic lupus erythematosus, unspecified: Secondary | ICD-10-CM | POA: Diagnosis not present

## 2021-05-09 DIAGNOSIS — Z79899 Other long term (current) drug therapy: Secondary | ICD-10-CM | POA: Diagnosis not present

## 2021-06-05 ENCOUNTER — Telehealth: Payer: Self-pay

## 2021-06-05 NOTE — Telephone Encounter (Signed)
error 

## 2021-06-07 ENCOUNTER — Encounter: Payer: Self-pay | Admitting: Physician Assistant

## 2021-06-07 ENCOUNTER — Ambulatory Visit (INDEPENDENT_AMBULATORY_CARE_PROVIDER_SITE_OTHER): Payer: Medicare Other | Admitting: Physician Assistant

## 2021-06-07 ENCOUNTER — Other Ambulatory Visit: Payer: Self-pay

## 2021-06-07 DIAGNOSIS — G4710 Hypersomnia, unspecified: Secondary | ICD-10-CM

## 2021-06-07 DIAGNOSIS — E538 Deficiency of other specified B group vitamins: Secondary | ICD-10-CM | POA: Diagnosis not present

## 2021-06-07 DIAGNOSIS — Z0001 Encounter for general adult medical examination with abnormal findings: Secondary | ICD-10-CM | POA: Diagnosis not present

## 2021-06-07 DIAGNOSIS — Z1231 Encounter for screening mammogram for malignant neoplasm of breast: Secondary | ICD-10-CM

## 2021-06-07 DIAGNOSIS — R300 Dysuria: Secondary | ICD-10-CM

## 2021-06-07 DIAGNOSIS — I10 Essential (primary) hypertension: Secondary | ICD-10-CM | POA: Diagnosis not present

## 2021-06-07 DIAGNOSIS — M329 Systemic lupus erythematosus, unspecified: Secondary | ICD-10-CM

## 2021-06-07 DIAGNOSIS — IMO0002 Reserved for concepts with insufficient information to code with codable children: Secondary | ICD-10-CM

## 2021-06-07 DIAGNOSIS — Z3041 Encounter for surveillance of contraceptive pills: Secondary | ICD-10-CM

## 2021-06-07 MED ORDER — NORETHINDRONE ACET-ETHINYL EST 1.5-30 MG-MCG PO TABS: 1.0000 | ORAL_TABLET | Freq: Every day | ORAL | 4 refills | Status: DC

## 2021-06-07 MED ORDER — CYANOCOBALAMIN 1000 MCG/ML IJ SOLN
1000.0000 ug | Freq: Once | INTRAMUSCULAR | Status: AC
  Administered 2021-06-07: 1000 ug via INTRAMUSCULAR

## 2021-06-07 MED ORDER — AMLODIPINE BESYLATE 5 MG PO TABS
5.0000 mg | ORAL_TABLET | Freq: Every day | ORAL | 1 refills | Status: DC
Start: 2021-06-07 — End: 2021-07-09

## 2021-06-07 NOTE — Progress Notes (Signed)
[facility] [place],  [address]  Internal MEDICINE  Office Visit Note  Patient Name: Morgan Kent  [id number]  [id number]  Date of Service: 06/10/2021  Chief Complaint  Patient presents with   Medicare Wellness    Refills   Depression   Anxiety   Hypertension     HPI Pt is here for routine health maintenance examination -Can stop vit D weekly supplement, but can do OTC. Low B12--received injection in office. -Was told she may need another carpal tunnel surgery, but does not want surgery again. Wears splints at night and uses gabapentin for nerve pain  -Neurologist was giving muscle relaxer pills and is going to call them for refill and to check when follow up visit is -Sees pain management -BP not checked at home. Was seeing cardiology and states everything is fine. Coreg BID, amlodipine, lisinopirl and hctz. BP still high today and will double norvasc -Hx lupus and is on plaquenil, followed by rheum and derm -Hx OSA states machine taken away? ONO done with high ODI, but does not qualify for oxygen. She does snore and feels tired all the time. Will repeat PSG based on high ODI and symptoms and unclear when/why machine taken away. States she was compliant and benefited from it. -States she has been without her birth control for 2 months. Was seeing OBGYN but states she is no longer seeing them and requests refill of OCP. Also mentions she had an abnormal pap a long time ago, but that this resolved? Last pap on record is 2019 and is normal with neg HPV. Therefore due for repeat after 5 years (2024). -Due for mammogram--ordered today  Current Medication: Outpatient Encounter Medications as of 06/07/2021  Medication Sig   amLODipine (NORVASC) 5 MG tablet Take 1 tablet (5 mg total) by mouth daily.   albuterol (VENTOLIN HFA) 108 (90 Base) MCG/ACT inhaler Inhale 2 puffs into the lungs daily.   AUROVELA FE 1.5/30 1.5-30 MG-MCG tablet TAKE 1 TABLET BY  MOUTH AT BEDTIME   Budesonide (PULMICORT FLEXHALER) 90 MCG/ACT inhaler Inhale 2 puffs into the lungs 2 (two) times daily.   carvedilol (COREG) 25 MG tablet Take 1 tablet (25 mg total) by mouth 2 (two) times daily.   cetirizine (ZYRTEC) 10 MG tablet Take 1 tablet (10 mg total) by mouth daily.   clobetasol (TEMOVATE) 0.05 % external solution Apply 1 application topically 2 (two) times daily as needed. Apply to scalp   cyclobenzaprine (FLEXERIL) 10 MG tablet Take 1 tablet (10 mg total) by mouth 3 (three) times daily as needed.   diclofenac Sodium (VOLTAREN) 1 % GEL Apply 2 g topically 4 (four) times daily.   ferrous sulfate (FERROUSUL) 325 (65 FE) MG tablet Take 1 tablet (325 mg total) by mouth daily with breakfast.   fluticasone (FLONASE) 50 MCG/ACT nasal spray Place 1 spray into both nostrils at bedtime.   gabapentin (NEURONTIN) 300 MG capsule Take 1 capsule (300 mg total) by mouth 3 (three) times daily.   hydrochlorothiazide (HYDRODIURIL) 12.5 MG tablet Take 1 tablet (12.5 mg total) by mouth daily.   hydroxychloroquine (PLAQUENIL) 200 MG tablet Take 200 mg by mouth daily.    ketoconazole (NIZORAL) 2 % shampoo Apply 1 application topically every 3 (three) days. Let sit 5 minutes before rinsing out   lisinopril (ZESTRIL) 5 MG tablet Take 1 tablet (5 mg total) by mouth daily.   meloxicam (MOBIC) 7.5 MG tablet Take 1 tablet (7.5 mg total) by mouth 2 (  two) times daily.   mometasone (ELOCON) 0.1 % lotion APPLY TOPICALLY ONCE DAILY TO AFFECTED AREAS OF LUPUS IN SCALP AS NEEDED FOR FLARES   mometasone (ELOCON) 0.1 % ointment Apply topically daily. Qd prn flares to lupus sores on the face   montelukast (SINGULAIR) 10 MG tablet Take 1 tablet (10 mg total) by mouth at bedtime.   mupirocin ointment (BACTROBAN) 2 % Apply to open area on skin qd-bid   NARCAN 4 MG/0.1ML LIQD nasal spray kit SMARTSIG:1 Spray(s) Both Nares Once PRN   Norethindrone Acetate-Ethinyl Estradiol (AUROVELA 1.5/30) 1.5-30 MG-MCG tablet  Take 1 tablet by mouth daily.   oxyCODONE-acetaminophen (PERCOCET) 7.5-325 MG tablet Take 1 tablet by mouth every 4 (four) hours as needed for severe pain.   Polyethyl Glycol-Propyl Glycol (SYSTANE ULTRA) 0.4-0.3 % SOLN Place 1 drop into both eyes daily as needed (for dry eyes).    tacrolimus (PROTOPIC) 0.1 % ointment APPLY TOPICALLY TO THE AFFECTED AREA DAILY   [DISCONTINUED] amLODipine (NORVASC) 2.5 MG tablet Take 2.5 mg by mouth daily.   [DISCONTINUED] ergocalciferol (DRISDOL) 1.25 MG (50000 UT) capsule Take 1 capsule (50,000 Units total) by mouth once a week.   [DISCONTINUED] Norethindrone Acetate-Ethinyl Estradiol (AUROVELA 1.5/30) 1.5-30 MG-MCG tablet Take 1 tablet by mouth daily.   [EXPIRED] cyanocobalamin ((VITAMIN B-12)) injection 1,000 mcg    No facility-administered encounter medications on file as of 06/07/2021.    Surgical History: Past Surgical History:  Procedure Laterality Date   carpel tunnel  Left 2014   CRANIOTOMY  14,06   chiari   DENTAL SURGERY     HERNIA REPAIR     umbilical   PLACEMENT OF LUMBAR DRAIN N/A 07/07/2014   Procedure: PLACEMENT OF LUMBAR DRAIN AND CLOSURE OF CERVICAL INCISION;  Surgeon: Newman Pies, MD;  Location: [facility];  Service: Neurosurgery;  Laterality: N/A;   SUBOCCIPITAL CRANIECTOMY CERVICAL LAMINECTOMY N/A 09/29/2013   Procedure: SUBOCCIPITAL CRANIECTOMY CERVICAL LAMINECTOMY/DURAPLASTY;  Surgeon: Ophelia Charter, MD;  Location: [facility];  Service: Neurosurgery;  Laterality: N/A;  posterior   SUBOCCIPITAL CRANIECTOMY CERVICAL LAMINECTOMY N/A 06/23/2014   Procedure: SUBOCCIPITAL CRANIECTOMY CERVICAL LAMINECTOMY/DURAPLASTY;  Surgeon: Newman Pies, MD;  Location: [facility];  Service: Neurosurgery;  Laterality: N/A;  suboccipital craniectomy with cervical laminectomy and duraplasty    Medical History: Past Medical History:  Diagnosis Date   Anxiety    Arthritis    rheumo possible   Chiari malformation    Depression     Dizziness    Headache(784.0)    History of kidney stones    Hypertension    Lupus (HCC)    Lupus (HCC)    Nerve damage    NECK   PONV (postoperative nausea and vomiting)    nausea only   TMJ arthritis     Family History: Family History  Problem Relation Age of Onset   Heart disease Father    Hypertension Father    Hypertension Mother    Thyroid disease Mother    Asthma Other    Depression Other    Diabetes Other    Migraines Other    Stroke Other       Review of Systems  Constitutional:  Positive for fatigue. Negative for chills and unexpected weight change.  HENT:  Negative for congestion, postnasal drip, rhinorrhea, sneezing and sore throat.   Eyes:  Negative for redness.  Respiratory:  Negative for cough, chest tightness and shortness of breath.   Cardiovascular:  Negative for chest pain and palpitations.  Gastrointestinal:  Negative for abdominal pain, constipation, diarrhea, nausea and vomiting.  Genitourinary:  Negative for dysuria and frequency.  Musculoskeletal:  Positive for arthralgias. Negative for back pain, joint swelling and neck pain.  Skin:  Negative for rash.  Neurological: Negative.  Negative for tremors and numbness.  Hematological:  Negative for adenopathy. Does not bruise/bleed easily.  Psychiatric/Behavioral:  Positive for sleep disturbance. Negative for behavioral problems (Depression) and suicidal ideas. The patient is not nervous/anxious.     Vital Signs: BP (!) 156/92   Pulse 94   Temp (!) 97 F (36.1 C)   Resp 16   Ht 5' 6"  (1.676 m)   Wt 230 lb (104.3 kg)   SpO2 97%   BMI 37.12 kg/m    Physical Exam Vitals and nursing note reviewed.  Constitutional:      General: She is not in acute distress.    Appearance: She is well-developed. She is obese. She is not diaphoretic.  HENT:     Head: Normocephalic and atraumatic.     Mouth/Throat:     Pharynx: No oropharyngeal exudate.  Eyes:     Pupils: Pupils are equal, round, and  reactive to light.  Neck:     Thyroid: No thyromegaly.     Vascular: No JVD.     Trachea: No tracheal deviation.  Cardiovascular:     Rate and Rhythm: Normal rate and regular rhythm.     Heart sounds: Normal heart sounds. No murmur heard.   No friction rub. No gallop.  Pulmonary:     Effort: Pulmonary effort is normal. No respiratory distress.     Breath sounds: No wheezing or rales.  Chest:     Chest wall: No tenderness.  Abdominal:     General: Bowel sounds are normal.     Palpations: Abdomen is soft.  Musculoskeletal:        General: Normal range of motion.     Cervical back: Normal range of motion and neck supple.  Lymphadenopathy:     Cervical: No cervical adenopathy.  Skin:    General: Skin is warm and dry.  Neurological:     Mental Status: She is alert and oriented to person, place, and time.     Cranial Nerves: No cranial nerve deficit.  Psychiatric:        Behavior: Behavior normal.        Thought Content: Thought content normal.        Judgment: Judgment normal.     LABS: Recent Results (from the past 2160 hour(s))  UA/M w/rflx Culture, Routine     Status: Abnormal   Collection Time: 06/07/21  9:38 AM   Specimen: Urine   Urine  Result Value Ref Range   Specific Gravity, UA      >=1.030 (A) 1.005 - 1.030   pH, UA 5.5 5.0 - 7.5   Color, UA Yellow Yellow   Appearance Ur Cloudy (A) Clear   Leukocytes,UA Negative Negative   Protein,UA 2+ (A) Negative/Trace   Glucose, UA Negative Negative   Ketones, UA Trace (A) Negative   RBC, UA Negative Negative   Bilirubin, UA Negative Negative   Urobilinogen, Ur 0.2 0.2 - 1.0 mg/dL   Nitrite, UA Negative Negative   Microscopic Examination See below:     Comment: Microscopic was indicated and was performed.   Urinalysis Reflex Comment     Comment: This specimen will not reflex to a Urine Culture.  Microscopic Examination     Status: Abnormal  Collection Time: 06/07/21  9:38 AM   Urine  Result Value Ref Range    WBC, UA 0-5 0 - 5 /hpf   RBC 0-2 0 - 2 /hpf   Epithelial Cells (non renal) 0-10 0 - 10 /hpf   Casts Present (A) None seen /lpf   Cast Type Epithelial cell casts (A) N/A   Bacteria, UA Few None seen/Few        Assessment/Plan: 1. Encounter for general adult medical examination with abnormal findings CPE performed, labs reviewed, mammogram ordered and pap UTD  2. Essential hypertension Will increase norvasc to 5 mg and continue other medications as prescribed - amLODipine (NORVASC) 5 MG tablet; Take 1 tablet (5 mg total) by mouth daily.  Dispense: 90 tablet; Refill: 1  3. Hypersomnia Will order PSG due to hx of OSA, snoring, fatigue, uncontrolled HTN, and elevated BMI - PSG SLEEP STUDY  4. Lupus ([place]) Followed by rheum and derm  5. Encounter for surveillance of contraceptive pills Refill sent - Norethindrone Acetate-Ethinyl Estradiol (AUROVELA 1.5/30) 1.5-30 MG-MCG tablet; Take 1 tablet by mouth daily.  Dispense: 28 tablet; Refill: 4  6. Encounter for screening mammogram for breast cancer - MM DIGITAL SCREENING BILATERAL; Future  7. B12 deficiency - cyanocobalamin ((VITAMIN B-12)) injection 1,000 mcg  8. Dysuria - UA/M w/rflx Culture, Routine   General Counseling: Aranza verbalizes understanding of the findings of todays visit and agrees with plan of treatment. I have discussed any further diagnostic evaluation that may be needed or ordered today. We also reviewed her medications today. she has been encouraged to call the office with any questions or concerns that should arise related to todays visit.    Counseling:  Hypertension Counseling:   The following hypertensive lifestyle modification were recommended and discussed:  1. Limiting alcohol intake to less than 1 oz/day of ethanol:(24 oz of beer or 8 oz of wine or 2 oz of 100-proof whiskey). 2. Take baby ASA 81 mg daily. 3. Importance of regular aerobic exercise and losing weight. 4. Reduce dietary saturated fat  and cholesterol intake for overall cardiovascular health. 5. Maintaining adequate dietary potassium, calcium, and magnesium intake. 6. Regular monitoring of the blood pressure. 7. Reduce sodium intake to less than 100 mmol/day (less than 2.3 gm of sodium or less than 6 gm of sodium choride)    Orders Placed This Encounter  Procedures   Microscopic Examination   MM DIGITAL SCREENING BILATERAL   UA/M w/rflx Culture, Routine   PSG SLEEP STUDY    Meds ordered this encounter  Medications   cyanocobalamin ((VITAMIN B-12)) injection 1,000 mcg   Norethindrone Acetate-Ethinyl Estradiol (AUROVELA 1.5/30) 1.5-30 MG-MCG tablet    Sig: Take 1 tablet by mouth daily.    Dispense:  28 tablet    Refill:  4   amLODipine (NORVASC) 5 MG tablet    Sig: Take 1 tablet (5 mg total) by mouth daily.    Dispense:  90 tablet    Refill:  1    This patient was seen by Drema Dallas, PA-C in collaboration with Dr. Clayborn Bigness as a part of collaborative care agreement.  Total time spent:45 Minutes  Time spent includes review of chart, medications, test results, and follow up plan with the patient.     Lavera Guise, MD  Internal Medicine

## 2021-06-08 LAB — MICROSCOPIC EXAMINATION

## 2021-06-08 LAB — UA/M W/RFLX CULTURE, ROUTINE
Bilirubin, UA: NEGATIVE
Glucose, UA: NEGATIVE
Leukocytes,UA: NEGATIVE
Nitrite, UA: NEGATIVE
RBC, UA: NEGATIVE
Specific Gravity, UA: >=1.03 — AB (ref 1.005–1.030)
Urobilinogen, Ur: 0.2 mg/dL (ref 0.2–1.0)
pH, UA: 5.5 (ref 5.0–7.5)

## 2021-06-11 ENCOUNTER — Other Ambulatory Visit: Payer: Self-pay | Admitting: Dermatology

## 2021-06-11 ENCOUNTER — Other Ambulatory Visit: Payer: Self-pay | Admitting: Nurse Practitioner

## 2021-06-11 ENCOUNTER — Other Ambulatory Visit: Payer: Self-pay | Admitting: Cardiovascular Disease

## 2021-06-11 DIAGNOSIS — J4520 Mild intermittent asthma, uncomplicated: Secondary | ICD-10-CM

## 2021-06-11 DIAGNOSIS — J309 Allergic rhinitis, unspecified: Secondary | ICD-10-CM

## 2021-06-11 DIAGNOSIS — L930 Discoid lupus erythematosus: Secondary | ICD-10-CM

## 2021-06-11 DIAGNOSIS — L219 Seborrheic dermatitis, unspecified: Secondary | ICD-10-CM

## 2021-06-12 ENCOUNTER — Other Ambulatory Visit: Payer: Self-pay | Admitting: Cardiovascular Disease

## 2021-06-12 ENCOUNTER — Telehealth: Payer: Self-pay

## 2021-06-12 DIAGNOSIS — M79662 Pain in left lower leg: Secondary | ICD-10-CM | POA: Diagnosis not present

## 2021-06-12 DIAGNOSIS — I10 Essential (primary) hypertension: Secondary | ICD-10-CM | POA: Diagnosis not present

## 2021-06-12 DIAGNOSIS — M79661 Pain in right lower leg: Secondary | ICD-10-CM | POA: Diagnosis not present

## 2021-06-12 DIAGNOSIS — M542 Cervicalgia: Secondary | ICD-10-CM | POA: Diagnosis not present

## 2021-06-12 DIAGNOSIS — M545 Low back pain, unspecified: Secondary | ICD-10-CM | POA: Diagnosis not present

## 2021-06-12 DIAGNOSIS — G894 Chronic pain syndrome: Secondary | ICD-10-CM | POA: Diagnosis not present

## 2021-06-12 DIAGNOSIS — Z713 Dietary counseling and surveillance: Secondary | ICD-10-CM | POA: Diagnosis not present

## 2021-06-12 DIAGNOSIS — G545 Neuralgic amyotrophy: Secondary | ICD-10-CM | POA: Diagnosis not present

## 2021-06-12 DIAGNOSIS — G629 Polyneuropathy, unspecified: Secondary | ICD-10-CM | POA: Diagnosis not present

## 2021-06-12 DIAGNOSIS — M25512 Pain in left shoulder: Secondary | ICD-10-CM | POA: Diagnosis not present

## 2021-06-12 DIAGNOSIS — Z79891 Long term (current) use of opiate analgesic: Secondary | ICD-10-CM | POA: Diagnosis not present

## 2021-06-12 DIAGNOSIS — M5412 Radiculopathy, cervical region: Secondary | ICD-10-CM | POA: Diagnosis not present

## 2021-06-12 DIAGNOSIS — M25511 Pain in right shoulder: Secondary | ICD-10-CM | POA: Diagnosis not present

## 2021-06-12 NOTE — Telephone Encounter (Signed)
Rcvd [facility]. Gave to dfk to complete-[name]

## 2021-06-18 ENCOUNTER — Other Ambulatory Visit: Payer: Self-pay

## 2021-06-18 MED ORDER — LISINOPRIL 5 MG PO TABS: ORAL_TABLET | ORAL | 0 refills | Status: DC

## 2021-06-21 DIAGNOSIS — G4733 Obstructive sleep apnea (adult) (pediatric): Secondary | ICD-10-CM | POA: Diagnosis not present

## 2021-06-26 ENCOUNTER — Other Ambulatory Visit: Payer: Self-pay | Admitting: Cardiovascular Disease

## 2021-06-28 ENCOUNTER — Other Ambulatory Visit: Payer: Self-pay

## 2021-06-28 ENCOUNTER — Ambulatory Visit (INDEPENDENT_AMBULATORY_CARE_PROVIDER_SITE_OTHER): Payer: Medicare Other | Admitting: Dermatology

## 2021-06-28 DIAGNOSIS — L700 Acne vulgaris: Secondary | ICD-10-CM

## 2021-06-28 DIAGNOSIS — L930 Discoid lupus erythematosus: Secondary | ICD-10-CM

## 2021-06-28 DIAGNOSIS — L219 Seborrheic dermatitis, unspecified: Secondary | ICD-10-CM

## 2021-06-28 DIAGNOSIS — L819 Disorder of pigmentation, unspecified: Secondary | ICD-10-CM

## 2021-06-28 DIAGNOSIS — M3219 Other organ or system involvement in systemic lupus erythematosus: Secondary | ICD-10-CM

## 2021-06-28 DIAGNOSIS — L905 Scar conditions and fibrosis of skin: Secondary | ICD-10-CM

## 2021-06-28 NOTE — Progress Notes (Signed)
   Follow-Up Visit   Subjective  Morgan Kent is a 40 y.o. female who presents for the following: Discoid lupus erythematosus (Face, scalp, pt on plaquenil form Dr. Jefm Bryant, Mometasone oint prn, mometasone lotion prn scalp, mupirocin oint prn open sores), Seborrheic Dermatitis (Scalp, Ketoconazole 2% shampoo 2x/wk, mometasone lotion prn), and Acne (Face, not using any treatment at this time).  The following portions of the chart were reviewed this encounter and updated as appropriate:   Tobacco  Allergies  Meds  Problems  Med Hx  Surg Hx  Fam Hx     Review of Systems:  No other skin or systemic complaints except as noted in HPI or Assessment and Plan.  Objective  Well appearing patient in no apparent distress; mood and affect are within normal limits.  A focused examination was performed including face. Relevant physical exam findings are noted in the Assessment and Plan.  face, scalp Hyperpigmented plaques of cheeks         face Clear today  Scalp Pink patches with greasy scale.    Assessment & Plan  Systemic lupus erythematosus with other organ involvement, unspecified SLE type ([place]) body Controlled by Dr Jefm Bryant pt taking Plaquenil tablets.  Recent eye exam was good per patient Pt to continue f/u with Dr. Jefm Bryant  Discoid lupus erythematosus face, scalp Chronic and persistent with scarring and dyschromia Flared Cont Mometasone oint to active areas bid for 2 weeks, then decrease to 5d/wk until clear, then prn flares Cont Mupirocin oint qd to open sores face and scalp  Related Medications mometasone (ELOCON) 0.1 % ointment Apply topically daily. Qd prn flares to lupus sores on the face  mupirocin ointment (BACTROBAN) 2 % Apply to open area on skin qd-bid  mometasone (ELOCON) 0.1 % lotion APPLY TO LUPUS IN SCALP DAILY AS NEEDED FOR FLARES  Acne vulgaris face Will continue to hold acne treatment while lupus is flared  Seborrheic  dermatitis Scalp Seborrheic Dermatitis  -  is a chronic persistent rash characterized by pinkness and scaling most commonly of the mid face but also can occur on the scalp (dandruff), ears; mid chest and mid back. It tends to be exacerbated by stress and cooler weather.  People who have neurologic disease may experience new onset or exacerbation of existing seborrheic dermatitis.  The condition is not curable but treatable and can be controlled.  Cont Ketoconazole 2% shampoo 2x/wk, let sit 5 minutes before rinsing out Cont Mometasone lotion qd to aa scalp prn flares  Related Medications ketoconazole (NIZORAL) 2 % shampoo APPLY TOPICALLY 1 TIME FOR 1 DOSE  ketoconazole (NIZORAL) 2 % shampoo APPLY TOPICALLY TO THE AFFECTED AREA EVERY 3 DAYS, LET SIT 5 MINUTES BEFORE RINSING OUT  Return in about 6 months (around 12/29/2021) for Discoid lupus, seb derm, acne.  I, Othelia Pulling, RMA, am acting as scribe for Sarina Ser, MD . Documentation: I have reviewed the above documentation for accuracy and completeness, and I agree with the above.  Sarina Ser, MD

## 2021-06-28 NOTE — Patient Instructions (Addendum)
If you have any questions or concerns for your doctor, please call our main line at [phone] and press option 4 to reach your doctor's medical assistant. If no one answers, please leave a voicemail as directed and we will return your call as soon as possible. Messages left after 4 pm will be answered the following business day.   You may also send [place] a message via [place]. We typically respond to MyChart messages within 1-2 business days.  For prescription refills, please ask your pharmacy to contact our office. Our fax number is [phone].  If you have an urgent issue when the clinic is closed that cannot wait until the next business day, you can page your doctor at the number below.    Please note that while we do our best to be available for urgent issues outside of office hours, we are not available 24/7.   If you have an urgent issue and are unable to reach [place], you may choose to seek medical care at your doctor's office, retail clinic, urgent care center, or emergency room.  If you have a medical emergency, please immediately call 911 or go to the emergency department.  Pager Numbers  - Dr. Nehemiah Massed: [phone]  - Dr. Laurence Ferrari: [phone]  - Dr. Nicole Kindred: [phone]  In the event of inclement weather, please call our main line at [phone] for an update on the status of any delays or closures.  Dermatology Medication Tips: Please keep the boxes that topical medications come in in order to help keep track of the instructions about where and how to use these. Pharmacies typically print the medication instructions only on the boxes and not directly on the medication tubes.   If your medication is too expensive, please contact our office at [phone] option 4 or send [place] a message through [place].   We are unable to tell what your co-pay for medications will be in advance as this is different depending on your insurance coverage. However, we may be able to find a substitute  medication at lower cost or fill out paperwork to get insurance to cover a needed medication.   If a prior authorization is required to get your medication covered by your insurance company, please allow [place] 1-2 business days to complete this process.  Drug prices often vary depending on where the prescription is filled and some pharmacies may offer cheaper prices.  The website www.goodrx.com contains coupons for medications through different pharmacies. The prices here do not account for what the cost may be with help from insurance (it may be cheaper with your insurance), but the website can give you the price if you did not use any insurance.  - You can print the associated coupon and take it with your prescription to the pharmacy.  - You may also stop by our office during regular business hours and pick up a GoodRx coupon card.  - If you need your prescription sent electronically to a different pharmacy, notify our office through [facility] or by phone at [phone] option 4.   Continue Mometasone ointment to active areas on face twice a day for 2 weeks, then decrease to twice a day for five days a week until clear, then may stop and restart when you flare again. Continue Mupirocin ointment daily to open sores on face and scalp Continue Ketoconazole 2% shampoo 2 times a week to wash scalp, let sit 5 minutes before rinsing out. Continue Mometasone lotion one to two times a day  as needed for areas in scalp

## 2021-07-01 ENCOUNTER — Encounter: Payer: Self-pay | Admitting: Dermatology

## 2021-07-02 ENCOUNTER — Encounter (INDEPENDENT_AMBULATORY_CARE_PROVIDER_SITE_OTHER): Payer: Medicare Other | Admitting: Internal Medicine

## 2021-07-02 DIAGNOSIS — G4733 Obstructive sleep apnea (adult) (pediatric): Secondary | ICD-10-CM

## 2021-07-06 NOTE — Procedures (Signed)
[facility] Report Part I                                                                 Phone: [phone] Fax: [phone]  Patient Name: Morgan Kent, Morgan Kent Acquisition Number: [id number]  Date of Birth: 1981/01/10 Acquisition Date: 07/02/2021  Referring Physician: Drema Dallas, PA-C     History: The patient is a 40 year old female who was referred for evaluation of possible sleep apnea. Medical History: anxiety, arthritis, depression, dizziness, headache, hypertension, nerve damage (neck).  Medications: Norvasc, Ventolin hfa, Aurovela fe, Pulmicort flexhaler, Coreg, Zyrtec, Temovate, Flexeril, Voltaren, Ferrousul, Flonase, Neurontin, Hydrodiuril, Plaquenil, Nizoral, Zestril, Mobic, Elocon, Singulair, Bactroban, Narcan, Aurovela, Percocet  Procedure: This routine overnight polysomnogram was performed on the [facility] 5 using the standard diagnostic protocol. This included 6 channels of EEG, 2 channels of EOG, chin EMG, bilateral anterior tibialis EMG, nasal/oral thermistor, PTAF (nasal pressure transducer), chest and abdominal wall movements, EKG, pulse oximetry.  Description: The total recording time was 401.1 minutes. The total sleep time was 253.6 minutes. There were a total of 25.0 minutes of wakefulness after sleep onset for a reducedsleep efficiency of 63.2%. The latency to sleep onset was prolonged at 122.5 minutes. The R sleep onset latency was within normal limits at 82.0 minutes. Sleep parameters, as a percentage of the total sleep time, demonstrated 2.4% of sleep was in N1 sleep, 81.3% N2, 3.0% N3 and 13.4% R sleep. There were a total of 10 arousals for an arousal index of 2.4 arousals per hour of sleep that was normal.  Respiratory monitoring demonstrated occasional mild degree of snoring on the side. Supine sleep was not observed. There were 10 apneas and hypopneas for an Apnea Hypopnea Index of 2.4 apneas and hypopneas per hour of sleep. The REM related  apnea hypopnea index was 15.9/hr of REM sleep compared to a NREM AHI of 0.3/hr.  The average duration of the respiratory events was 21.3 seconds with a maximum duration of 26.5 seconds. The respiratory events were associated with peripheral oxygen desaturations on the average to 93%. The lowest oxygen desaturation associated with a respiratory event was 90%. Additionally, the baseline oxygen saturation during wakefulness was 93%, during NREM sleep averaged 99%, and during REM sleep averaged  99%. The total duration of oxygen < 90% was 12.9 minutes and <80% was 10.9 minutes.  Cardiac monitoring- did not demonstrate transient cardiac decelerations associated with the apneas. There were no significant cardiac rhythm irregularities.   Periodic limb movement monitoring- did not demonstrate periodic limb movements during sleep, however frequent, quasi-periodic limb movements were observed during periods of wakefulness.    Impression: This routine overnight polysomnogram demonstrated REM dependent obstructive sleep apnea with an Apnea Hypopnea Index of 15.9 apneas and hypopneas during REM sleep. However, due to limited sleep and a reduced REM percentage, the overall Apnea Hypopnea Index was low, 2.4/hr.   Frequent, quasi-periodic limb movements were observed during periods of wakefulness. Clincal correlation is suggested.  There was a reduced sleep efficiency with reduced percentages of REM and slow wave sleep.  These findings would appear to be due to the combination of increased upper airway resistance and limb movements.  Recommendations:     Would recommend a repeat polysomnogram is  suggested. The use of a non-benzodiazepine sleep aid is suggested for the study. Products containing diphenhydramine such as Benadryl or "PM" medications and medications which suppress REM sleep should be avoided.  Weight loss is recommended in a patient with a BMI of 37.1.     Morgan Gee, MD, [facility] Diplomate  ABMS-Pulmonary, Critical Care and Sleep Medicine  Electronically reviewed and digitally signed     [facility] Report Part II  Phone: [phone] Fax: [phone]  Patient last name Rissler Neck Size 15.0 in. Acquisition [id number]  Patient first name Morgan Kent Weight 230.0 lbs. Started 07/02/2021 at 10:27:03 PM  Birth date 11/09/1981 Height 66.0 in. Stopped 07/03/2021 at 5:13:21 AM  Age 27 BMI 37.1 lb/in2 Duration 401.1  Study Type Adult      Report generated by: Adriana Mccallum, RPSGT Sleep Data: Lights Out: 10:31:03 PM Sleep Onset: 12:33:33 AM  Lights On: 5:12:09 AM Sleep Efficiency: 63.2 %  Total Recording Time: 401.1 min Sleep Latency (from Lights Off) 122.5 min  Total Sleep Time (TST): 253.6 min R Latency (from Sleep Onset): 82.0 min  Sleep Period Time: 278.6 min Total number of awakenings: 7  Wake during sleep: 25.0 min Wake After Sleep Onset (WASO): 25.0 min   Sleep Data:         Arousal Summary: Stage  Latency from lights out (min) Latency from sleep onset (min) Duration (min) % Total Sleep Time  Normal values  N 1 122.5 0.0 6.0 2.4 (5%)  N 2 123.5 1.0 206.1 81.3 (50%)  N 3 300.0 177.5 7.5 3.0 (20%)  R 204.5 82.0 34.0 13.4 (25%)    Number Index  Spontaneous 10 2.4  Apneas & Hypopneas 1 0.2  RERAs 0 0.0       (Apneas & Hypopneas & RERAs)  (1) (0.2)  Limb Movement 0 0.0  Snore 0 0.0  TOTAL 11 2.6     Respiratory Data:  CA OA MA Apnea Hypopnea* A+ H RERA Total  Number 0 0 0 0 10 10 0 10  Mean Dur (sec) 0.0 0.0 0.0 0.0 21.3 21.3 0.0 21.3  Max Dur (sec) 0.0 0.0 0.0 0.0 26.5 26.5 0.0 26.5  Total Dur (min) 0.0 0.0 0.0 0.0 3.5 3.5 0.0 3.5  % of TST 0.0 0.0 0.0 0.0 1.4 1.4 0.0 1.4  Index (#/h TST) 0.0 0.0 0.0 0.0 2.4 2.4 0.0 2.4  *Hypopneas scored based on 4% or greater desaturation.  Sleep Stage:        REM NREM TST  AHI 15.9 0.3 2.4  RDI 15.9 0.3 2.4       Body Position Data:  Sleep (min) TST (%) REM (min) NREM (min) CA (#)  OA (#) MA (#) HYP (#) AHI (#/h) RERA (#) RDI (#/h) Desat (#)  Supine 12.2 4.81 0.0 12.2 0 0 0 0 0.0 0 0.00 0  Non-Supine 241.40 95.19 34.00 207.40 0.00 0.00 0.00 10.00 2.49 0 2.49 11.00  Left: 241.4 95.19 34.0 207.4 0 0 0 10 2.5 0 2.5 11     Snoring: Total number of snoring episodes  0  Total time with snoring    min (   % of sleep)   Oximetry Distribution:             WK REM NREM TOTAL  Average (%)   93 99 99 97  < 90% 12.9 0.0 0.0 12.9  < 80% 10.9 0.0 0.0 10.9  < 70% 8.8 0.0 0.0 8.8  #  of Desaturations* 0 '9 2 11  '$ Desat Index (#/hour) 0.0 15.9 0.5 2.6  Desat Max (%) 0 '10 5 10  '$ Desat Max Dur (sec) 0.0 29.0 8.0 29.0  Approx Min O2 during sleep 90  Approx min O2 during a respiratory event 90  Was Oxygen added (Y/N) and final rate No:   0 LPM  *Desaturations based on 3% or greater drop from baseline.   Cheyne Stokes Breathing: None Present    Heart Rate Summary:  Average Heart Rate During Sleep 89.9 bpm      Highest Heart Rate During Sleep (95th %) 94.0 bpm      Highest Heart Rate During Sleep 96 bpm      Highest Heart Rate During Recording (TIB) 195 bpm (artifact)   Heart Rate Observations: Event Type # Events   Bradycardia 0 Lowest HR Scored: N/A  Sinus Tachycardia During Sleep 0 Highest HR Scored: N/A  Narrow Complex Tachycardia 0 Highest HR Scored: N/A  Wide Complex Tachycardia 0 Highest HR Scored: N/A  Asystole 0 Longest Pause: N/A  Atrial Fibrillation 0 Duration Longest Event: N/A  Other Arrythmias  No Type:    Periodic Limb Movement Data: (Primary legs unless otherwise noted) Total # Limb Movement 0 Limb Movement Index 0.0  Total # PLMS    PLMS Index     Total # PLMS Arousals    PLMS Arousal Index     Percentage Sleep Time with PLMS   min (   % sleep)  Mean Duration limb movements (secs)

## 2021-07-09 ENCOUNTER — Ambulatory Visit (INDEPENDENT_AMBULATORY_CARE_PROVIDER_SITE_OTHER): Payer: Medicare Other | Admitting: Physician Assistant

## 2021-07-09 ENCOUNTER — Encounter: Payer: Self-pay | Admitting: Physician Assistant

## 2021-07-09 ENCOUNTER — Telehealth: Payer: Self-pay

## 2021-07-09 ENCOUNTER — Other Ambulatory Visit: Payer: Self-pay

## 2021-07-09 VITALS — BP 138/82 | HR 78 | Temp 97.8°F | Resp 16 | Ht 66.0 in | Wt 236.0 lb

## 2021-07-09 DIAGNOSIS — I10 Essential (primary) hypertension: Secondary | ICD-10-CM | POA: Diagnosis not present

## 2021-07-09 DIAGNOSIS — G4710 Hypersomnia, unspecified: Secondary | ICD-10-CM

## 2021-07-09 DIAGNOSIS — G4709 Other insomnia: Secondary | ICD-10-CM

## 2021-07-09 DIAGNOSIS — M329 Systemic lupus erythematosus, unspecified: Secondary | ICD-10-CM | POA: Diagnosis not present

## 2021-07-09 DIAGNOSIS — E669 Obesity, unspecified: Secondary | ICD-10-CM | POA: Diagnosis not present

## 2021-07-09 MED ORDER — AMLODIPINE BESYLATE 5 MG PO TABS: 5.0000 mg | ORAL_TABLET | Freq: Every day | ORAL | 0 refills | Status: DC

## 2021-07-09 MED ORDER — ZOLPIDEM TARTRATE 5 MG PO TABS: ORAL_TABLET | ORAL | 0 refills | Status: DC

## 2021-07-09 NOTE — Telephone Encounter (Signed)
PATIENT REPEAT PSG SENT OVER TO [facility] WITH SLEEP AID MEDICATION. PATIENT ALSO SCHEDULED A F/U ON 08/23/2021 TO GO OVER SS RESULTS.

## 2021-07-09 NOTE — Progress Notes (Signed)
[facility], [place], [place] [address]  Internal MEDICINE  Office Visit Note  Patient Name: Morgan Kent  [id number]  [id number]  Date of Service: 07/09/2021  Chief Complaint  Patient presents with   Follow-up   Hypertension     HPI Pt is here for routine follow up -Had repeat sleep study after having stopped CPAP at some point for unknown reason. Did not have enough sleep time to get accurate picture. Did have elevated AHI during REM but limited REM time seen. Repeat PSG with sleep aid recommended and will be ordered. This is additionally reocmmended after ONO showed high ODI and patient has daytime sleepiness and snores. -BP at home not checked, did increase amlodipine to 27m last time and is improved in office. Advised to start monitoring BP at home now.  -Lupus followed by rheum and derm -Forgot to schedule mammogram and will call now  Current Medication: Outpatient Encounter Medications as of 07/09/2021  Medication Sig   albuterol (VENTOLIN HFA) 108 (90 Base) MCG/ACT inhaler Inhale 2 puffs into the lungs daily.   amLODipine (NORVASC) 5 MG tablet Take 1 tablet (5 mg total) by mouth daily.   AUROVELA FE 1.5/30 1.5-30 MG-MCG tablet TAKE 1 TABLET BY MOUTH AT BEDTIME   Budesonide (PULMICORT FLEXHALER) 90 MCG/ACT inhaler Inhale 2 puffs into the lungs 2 (two) times daily.   carvedilol (COREG) 25 MG tablet TAKE 1 TABLET(25 MG) BY MOUTH TWICE DAILY   cetirizine (ZYRTEC) 10 MG tablet Take 1 tablet (10 mg total) by mouth daily.   clobetasol (TEMOVATE) 0.05 % external solution Apply 1 application topically 2 (two) times daily as needed. Apply to scalp   cyclobenzaprine (FLEXERIL) 10 MG tablet Take 1 tablet (10 mg total) by mouth 3 (three) times daily as needed.   diclofenac Sodium (VOLTAREN) 1 % GEL Apply 2 g topically 4 (four) times daily.   ferrous sulfate (FERROUSUL) 325 (65 FE) MG tablet Take 1 tablet (325 mg total) by mouth daily with breakfast.    fluticasone (FLONASE) 50 MCG/ACT nasal spray Place 1 spray into both nostrils at bedtime.   gabapentin (NEURONTIN) 300 MG capsule Take 1 capsule (300 mg total) by mouth 3 (three) times daily.   hydrochlorothiazide (HYDRODIURIL) 12.5 MG tablet TAKE 1 TABLET(12.5 MG) BY MOUTH DAILY   hydroxychloroquine (PLAQUENIL) 200 MG tablet Take 200 mg by mouth daily.    ketoconazole (NIZORAL) 2 % shampoo APPLY TOPICALLY 1 TIME FOR 1 DOSE   ketoconazole (NIZORAL) 2 % shampoo APPLY TOPICALLY TO THE AFFECTED AREA EVERY 3 DAYS, LET SIT 5 MINUTES BEFORE RINSING OUT   lisinopril (ZESTRIL) 5 MG tablet TAKE 1 TABLET(5 MG) BY MOUTH DAILY   meloxicam (MOBIC) 7.5 MG tablet Take 1 tablet (7.5 mg total) by mouth 2 (two) times daily.   mometasone (ELOCON) 0.1 % lotion APPLY TO LUPUS IN SCALP DAILY AS NEEDED FOR FLARES   mometasone (ELOCON) 0.1 % ointment Apply topically daily. Qd prn flares to lupus sores on the face   montelukast (SINGULAIR) 10 MG tablet Take 1 tablet (10 mg total) by mouth at bedtime.   mupirocin ointment (BACTROBAN) 2 % Apply to open area on skin qd-bid   NARCAN 4 MG/0.1ML LIQD nasal spray kit SMARTSIG:1 Spray(s) Both Nares Once PRN   Norethindrone Acetate-Ethinyl Estradiol (AUROVELA 1.5/30) 1.5-30 MG-MCG tablet Take 1 tablet by mouth daily.   oxyCODONE-acetaminophen (PERCOCET) 7.5-325 MG tablet Take 1 tablet by mouth every 4 (four) hours as needed for severe pain.  Polyethyl Glycol-Propyl Glycol (SYSTANE ULTRA) 0.4-0.3 % SOLN Place 1 drop into both eyes daily as needed (for dry eyes).    tacrolimus (PROTOPIC) 0.1 % ointment APPLY TOPICALLY TO THE AFFECTED AREA DAILY   zolpidem (AMBIEN) 5 MG tablet Take 1 tablet by mouth prior to sleep study   [DISCONTINUED] amLODipine (NORVASC) 5 MG tablet Take 1 tablet (5 mg total) by mouth daily.   [DISCONTINUED] amLODipine (NORVASC) 5 MG tablet Take 5 mg by mouth daily.   No facility-administered encounter medications on file as of 07/09/2021.    Surgical  History: Past Surgical History:  Procedure Laterality Date   carpel tunnel  Left 2014   CRANIOTOMY  14,06   chiari   DENTAL SURGERY     HERNIA REPAIR     umbilical   PLACEMENT OF LUMBAR DRAIN N/A 07/07/2014   Procedure: PLACEMENT OF LUMBAR DRAIN AND CLOSURE OF CERVICAL INCISION;  Surgeon: Newman Pies, MD;  Location: [facility];  Service: Neurosurgery;  Laterality: N/A;   SUBOCCIPITAL CRANIECTOMY CERVICAL LAMINECTOMY N/A 09/29/2013   Procedure: SUBOCCIPITAL CRANIECTOMY CERVICAL LAMINECTOMY/DURAPLASTY;  Surgeon: Ophelia Charter, MD;  Location: [place] [facility];  Service: Neurosurgery;  Laterality: N/A;  posterior   SUBOCCIPITAL CRANIECTOMY CERVICAL LAMINECTOMY N/A 06/23/2014   Procedure: SUBOCCIPITAL CRANIECTOMY CERVICAL LAMINECTOMY/DURAPLASTY;  Surgeon: Newman Pies, MD;  Location: [place] [facility];  Service: Neurosurgery;  Laterality: N/A;  suboccipital craniectomy with cervical laminectomy and duraplasty    Medical History: Past Medical History:  Diagnosis Date   Anxiety    Arthritis    rheumo possible   Chiari malformation    Depression    Dizziness    Headache(784.0)    History of kidney stones    Hypertension    Lupus (HCC)    Lupus (HCC)    Nerve damage    NECK   PONV (postoperative nausea and vomiting)    nausea only   TMJ arthritis     Family History: Family History  Problem Relation Age of Onset   Heart disease Father    Hypertension Father    Hypertension Mother    Thyroid disease Mother    Asthma Other    Depression Other    Diabetes Other    Migraines Other    Stroke Other     Social History   Socioeconomic History   Marital status: Single    Spouse name: Not on file   Number of children: Not on file   Years of education: Not on file   Highest education level: Not on file  Occupational History   Not on file  Tobacco Use   Smoking status: Never   Smokeless tobacco: Never  Vaping Use   Vaping Use: Never used  Substance and Sexual Activity    Alcohol use: No    Alcohol/week: 0.0 standard drinks   Drug use: No   Sexual activity: Not on file  Other Topics Concern   Not on file  Social History Narrative   Lives with 3 children in 2 story home.  Unemployed.  Trying to get disability.  Education: high school.   Social Determinants of Health   Financial Resource Strain: Not on file  Food Insecurity: Not on file  Transportation Needs: Not on file  Physical Activity: Not on file  Stress: Not on file  Social Connections: Not on file  Intimate Partner Violence: Not on file      Review of Systems  Constitutional:  Positive for fatigue. Negative for chills and unexpected  weight change.  HENT:  Positive for postnasal drip. Negative for congestion, rhinorrhea, sneezing and sore throat.   Eyes:  Negative for redness.  Respiratory:  Negative for cough, chest tightness and shortness of breath.   Cardiovascular:  Negative for chest pain and palpitations.  Gastrointestinal:  Negative for abdominal pain, constipation, diarrhea, nausea and vomiting.  Genitourinary:  Negative for dysuria and frequency.  Musculoskeletal:  Positive for arthralgias. Negative for back pain, joint swelling and neck pain.  Skin:  Negative for rash.  Neurological: Negative.  Negative for tremors and numbness.  Hematological:  Negative for adenopathy. Does not bruise/bleed easily.  Psychiatric/Behavioral:  Negative for behavioral problems (Depression), sleep disturbance and suicidal ideas. The patient is not nervous/anxious.    Vital Signs: BP 138/82   Pulse 78   Temp 97.8 F (36.6 C)   Resp 16   Ht _0  (1.676 m)   Wt 236 lb (107 kg)   SpO2 98%   BMI 38.09 kg/m    Physical Exam Vitals and nursing note reviewed.  Constitutional:      General: She is not in acute distress.    Appearance: She is well-developed. She is obese. She is not diaphoretic.  HENT:     Head: Normocephalic and atraumatic.     Mouth/Throat:     Pharynx: No oropharyngeal  exudate.  Eyes:     Pupils: Pupils are equal, round, and reactive to light.  Neck:     Thyroid: No thyromegaly.     Vascular: No JVD.     Trachea: No tracheal deviation.  Cardiovascular:     Rate and Rhythm: Normal rate and regular rhythm.     Heart sounds: Normal heart sounds. No murmur heard.   No friction rub. No gallop.  Pulmonary:     Effort: Pulmonary effort is normal. No respiratory distress.     Breath sounds: No wheezing or rales.  Chest:     Chest wall: No tenderness.  Abdominal:     General: Bowel sounds are normal.     Palpations: Abdomen is soft.  Musculoskeletal:        General: Normal range of motion.     Cervical back: Normal range of motion and neck supple.  Lymphadenopathy:     Cervical: No cervical adenopathy.  Skin:    General: Skin is warm and dry.  Neurological:     Mental Status: She is alert and oriented to person, place, and time.     Cranial Nerves: No cranial nerve deficit.  Psychiatric:        Behavior: Behavior normal.        Thought Content: Thought content normal.        Judgment: Judgment normal.       Assessment/Plan: 1. Essential hypertension Stable, will continue current medications and have pt monitor BP at home  2. Hypersomnia Will order repeat sleep study since last Psg showed elevated AHI during REM, but limited REM time/ limited sleep time overall. Additionally patient had high ODI on ONO, snores, has daytime fatigue, elevated BMI, and HTN and would benefit from PSG to rule out OSA. - PSG SLEEP STUDY  3. Other insomnia May take ambien on night of sleep study - zolpidem (AMBIEN) 5 MG tablet; Take 1 tablet by mouth prior to sleep study  Dispense: 1 tablet; Refill: 0  4. Lupus ([place]) Followed by rheum and derm  5. Obesity (BMI 30-39.9) Will order repeat PSG Obesity Counseling: Had a lengthy discussion regarding patients BMI  and weight issues. Patient was instructed on portion control as well as increased activity. Also  discussed caloric restrictions with trying to maintain intake less than 2000 Kcal. Discussions were made in accordance with the 5As of weight management. Simple actions such as not eating late and if able to, taking a walk is suggested.  General Counseling: Celesta verbalizes understanding of the findings of todays visit and agrees with plan of treatment. I have discussed any further diagnostic evaluation that may be needed or ordered today. We also reviewed her medications today. she has been encouraged to call the office with any questions or concerns that should arise related to todays visit.    Orders Placed This Encounter  Procedures   PSG SLEEP STUDY     Meds ordered this encounter  Medications   zolpidem (AMBIEN) 5 MG tablet    Sig: Take 1 tablet by mouth prior to sleep study    Dispense:  1 tablet    Refill:  0     This patient was seen by Drema Dallas, PA-C in collaboration with Dr. Clayborn Bigness as a part of collaborative care agreement.   Total time spent:35 Minutes Time spent includes review of chart, medications, test results, and follow up plan with the patient.      Dr Lavera Guise Internal medicine

## 2021-07-10 ENCOUNTER — Other Ambulatory Visit: Payer: Self-pay | Admitting: Dermatology

## 2021-07-10 DIAGNOSIS — L930 Discoid lupus erythematosus: Secondary | ICD-10-CM

## 2021-07-13 DIAGNOSIS — M545 Low back pain, unspecified: Secondary | ICD-10-CM | POA: Diagnosis not present

## 2021-07-13 DIAGNOSIS — M542 Cervicalgia: Secondary | ICD-10-CM | POA: Diagnosis not present

## 2021-07-13 DIAGNOSIS — G894 Chronic pain syndrome: Secondary | ICD-10-CM | POA: Diagnosis not present

## 2021-07-13 DIAGNOSIS — M5412 Radiculopathy, cervical region: Secondary | ICD-10-CM | POA: Diagnosis not present

## 2021-07-13 DIAGNOSIS — M25512 Pain in left shoulder: Secondary | ICD-10-CM | POA: Diagnosis not present

## 2021-07-13 DIAGNOSIS — I10 Essential (primary) hypertension: Secondary | ICD-10-CM | POA: Diagnosis not present

## 2021-07-13 DIAGNOSIS — M79661 Pain in right lower leg: Secondary | ICD-10-CM | POA: Diagnosis not present

## 2021-07-13 DIAGNOSIS — G9009 Other idiopathic peripheral autonomic neuropathy: Secondary | ICD-10-CM | POA: Diagnosis not present

## 2021-07-13 DIAGNOSIS — M25511 Pain in right shoulder: Secondary | ICD-10-CM | POA: Diagnosis not present

## 2021-07-13 DIAGNOSIS — M79662 Pain in left lower leg: Secondary | ICD-10-CM | POA: Diagnosis not present

## 2021-07-13 DIAGNOSIS — Z724 Inappropriate diet and eating habits: Secondary | ICD-10-CM | POA: Diagnosis not present

## 2021-07-13 DIAGNOSIS — Z79891 Long term (current) use of opiate analgesic: Secondary | ICD-10-CM | POA: Diagnosis not present

## 2021-07-13 DIAGNOSIS — G629 Polyneuropathy, unspecified: Secondary | ICD-10-CM | POA: Diagnosis not present

## 2021-07-20 ENCOUNTER — Other Ambulatory Visit: Payer: Self-pay | Admitting: Physician Assistant

## 2021-07-20 DIAGNOSIS — Z1231 Encounter for screening mammogram for malignant neoplasm of breast: Secondary | ICD-10-CM

## 2021-07-23 ENCOUNTER — Other Ambulatory Visit: Payer: Self-pay

## 2021-07-23 ENCOUNTER — Emergency Department
Admission: EM | Admit: 2021-07-23 | Discharge: 2021-07-23 | Disposition: A | Discharge status: Home / Self Care | Payer: Medicare Other | Attending: Emergency Medicine | Admitting: Emergency Medicine

## 2021-07-23 DIAGNOSIS — Z7951 Long term (current) use of inhaled steroids: Secondary | ICD-10-CM | POA: Insufficient documentation

## 2021-07-23 DIAGNOSIS — Z79899 Other long term (current) drug therapy: Secondary | ICD-10-CM | POA: Diagnosis not present

## 2021-07-23 DIAGNOSIS — Z20822 Contact with and (suspected) exposure to covid-19: Secondary | ICD-10-CM | POA: Diagnosis not present

## 2021-07-23 DIAGNOSIS — I5022 Chronic systolic (congestive) heart failure: Secondary | ICD-10-CM | POA: Diagnosis not present

## 2021-07-23 DIAGNOSIS — R059 Cough, unspecified: Secondary | ICD-10-CM | POA: Diagnosis present

## 2021-07-23 DIAGNOSIS — B9789 Other viral agents as the cause of diseases classified elsewhere: Secondary | ICD-10-CM | POA: Diagnosis not present

## 2021-07-23 DIAGNOSIS — I110 Hypertensive heart disease with heart failure: Secondary | ICD-10-CM | POA: Insufficient documentation

## 2021-07-23 DIAGNOSIS — J4520 Mild intermittent asthma, uncomplicated: Secondary | ICD-10-CM | POA: Insufficient documentation

## 2021-07-23 DIAGNOSIS — J069 Acute upper respiratory infection, unspecified: Secondary | ICD-10-CM | POA: Diagnosis not present

## 2021-07-23 LAB — RESP PANEL BY RT-PCR (FLU A&B, COVID) ARPGX2
Influenza A by PCR: NEGATIVE
Influenza B by PCR: NEGATIVE
SARS Coronavirus 2 by RT PCR: NEGATIVE

## 2021-07-23 MED ORDER — NIRMATRELVIR/RITONAVIR (PAXLOVID)TABLET: 3.0000 | ORAL_TABLET | Freq: Two times a day (BID) | ORAL | 0 refills | Status: AC

## 2021-07-23 NOTE — ED Notes (Signed)
See triage note  presents with scratchy throat and dry cough  sx's started this weekend  afebrile on arrival

## 2021-07-23 NOTE — ED Provider Notes (Addendum)
[facility] Emergency Department Provider Note  Time seen: 11:01 AM  I have reviewed the triage vital signs and the nursing notes.   HISTORY  Chief Complaint Cough   HPI BRIDNEY GUADARRAMA is a 40 y.o. female with a past medical history of anxiety, depression, hypertension, lupus, presents to the emergency department for upper respiratory symptoms.  According to the patient for the past 2 days she has had a dry cough slight sore throat.  Patient denies any chest pain.  No known fever.  States daughter is sick with similar symptoms who is here with the patient.  Family member recently diagnosed with bronchitis at home.  Past Medical History:  Diagnosis Date   Anxiety    Arthritis    rheumo possible   Chiari malformation    Depression    Dizziness    Headache(784.0)    History of kidney stones    Hypertension    Lupus (HCC)    Lupus ([place])    Nerve damage    NECK   PONV (postoperative nausea and vomiting)    nausea only   TMJ arthritis     Patient Active Problem List   Diagnosis Date Noted   Hemorrhagic cyst of right ovary 11/26/2019   Menorrhagia with regular cycle 11/07/2019   Iron deficiency anemia 11/07/2019   Inflammatory polyarthritis ([place]) 11/07/2019   Vitamin B12 deficiency 08/03/2019   Raynaud's disease without gangrene 08/03/2019   Mild intermittent asthma without complication 02/77/4128   BMI 39.0-39.9,adult 06/19/2019   Sore throat 12/23/2018   Flu-like symptoms 12/23/2018   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   Candidiasis 12/08/2018   Body mass index 40.0-44.9, adult ([place]) 12/08/2018   Impaired fasting glucose 09/07/2018   Other fatigue 09/07/2018   Vitamin D deficiency 09/07/2018   Encounter for annual general medical examination with abnormal findings in adult 05/28/2018   Screening for malignant neoplasm of cervix 05/28/2018   Dysuria 05/28/2018   Allergic rhinitis 04/16/2018   Acute recurrent sinusitis 03/20/2018    Tachycardia 08/20/2016   Shortness of breath 08/20/2016   Congestive dilated cardiomyopathy ([place]) 11/22/2015   Lupus ([place]) 11/22/2015   Moderate obesity 11/22/2015   OSA (obstructive sleep apnea) 11/22/2015   Essential hypertension 78/67/6720   Chronic systolic CHF (congestive heart failure) ([employer]) 08/21/2015   Numbness and tingling 08/04/2014   Chiari malformation 08/04/2014   Sleep disorder 08/04/2014   Postprocedural pseudomeningocele 07/07/2014   Chiari malformation type I ([place]) 06/23/2014   Carpal tunnel syndrome 02/14/2014   Depression 02/14/2014   Neuralgia 02/14/2014   Migraines 02/14/2014   Sleep apnea 02/14/2014   Systemic lupus erythematosus ([place]) 02/14/2014   Hypertension 02/14/2014    Past Surgical History:  Procedure Laterality Date   carpel tunnel  Left 2014   CRANIOTOMY  14,06   chiari   DENTAL SURGERY     HERNIA REPAIR     umbilical   PLACEMENT OF LUMBAR DRAIN N/A 07/07/2014   Procedure: PLACEMENT OF LUMBAR DRAIN AND CLOSURE OF CERVICAL INCISION;  Surgeon: Newman Pies, MD;  Location: [place] [facility];  Service: Neurosurgery;  Laterality: N/A;   SUBOCCIPITAL CRANIECTOMY CERVICAL LAMINECTOMY N/A 09/29/2013   Procedure: SUBOCCIPITAL CRANIECTOMY CERVICAL LAMINECTOMY/DURAPLASTY;  Surgeon: Ophelia Charter, MD;  Location: [place] [facility];  Service: Neurosurgery;  Laterality: N/A;  posterior   SUBOCCIPITAL CRANIECTOMY CERVICAL LAMINECTOMY N/A 06/23/2014   Procedure: SUBOCCIPITAL CRANIECTOMY CERVICAL LAMINECTOMY/DURAPLASTY;  Surgeon: Newman Pies, MD;  Location: [place] [facility];  Service: Neurosurgery;  Laterality: N/A;  suboccipital craniectomy with cervical laminectomy and duraplasty    Prior to Admission medications   Medication Sig Start Date End Date Taking? Authorizing Provider  albuterol (VENTOLIN HFA) 108 (90 Base) MCG/ACT inhaler Inhale 2 puffs into the lungs daily. 03/02/21   Luiz Ochoa, NP  amLODipine (NORVASC) 5 MG tablet Take 1 tablet (5 mg total) by  mouth daily. 07/09/21   McDonough, Si Gaul, PA-C  AUROVELA FE 1.5/30 1.5-30 MG-MCG tablet TAKE 1 TABLET BY MOUTH AT BEDTIME 11/06/20   Harlin Heys, MD  Budesonide (PULMICORT FLEXHALER) 90 MCG/ACT inhaler Inhale 2 puffs into the lungs 2 (two) times daily. 02/01/21   Luiz Ochoa, NP  carvedilol (COREG) 25 MG tablet TAKE 1 TABLET(25 MG) BY MOUTH TWICE DAILY 06/26/21   Minna Merritts, MD  cetirizine (ZYRTEC) 10 MG tablet Take 1 tablet (10 mg total) by mouth daily. 02/21/20   Ronnell Freshwater, NP  clobetasol (TEMOVATE) 0.05 % external solution Apply 1 application topically 2 (two) times daily as needed. Apply to scalp    [provider]  cyclobenzaprine (FLEXERIL) 10 MG tablet Take 1 tablet (10 mg total) by mouth 3 (three) times daily as needed. 02/01/21   Luiz Ochoa, NP  diclofenac Sodium (VOLTAREN) 1 % GEL Apply 2 g topically 4 (four) times daily. 03/02/21   Luiz Ochoa, NP  ferrous sulfate (FERROUSUL) 325 (65 FE) MG tablet Take 1 tablet (325 mg total) by mouth daily with breakfast. 10/03/20   Ronnell Freshwater, NP  fluticasone (FLONASE) 50 MCG/ACT nasal spray Place 1 spray into both nostrils at bedtime. 02/01/21   Luiz Ochoa, NP  gabapentin (NEURONTIN) 300 MG capsule Take 1 capsule (300 mg total) by mouth 3 (three) times daily. 05/07/21   Lavera Guise, MD  hydrochlorothiazide (HYDRODIURIL) 12.5 MG tablet TAKE 1 TABLET(12.5 MG) BY MOUTH DAILY 06/12/21   Minna Merritts, MD  hydroxychloroquine (PLAQUENIL) 200 MG tablet Take 200 mg by mouth daily.     [provider]  ketoconazole (NIZORAL) 2 % shampoo APPLY TOPICALLY 1 TIME FOR 1 DOSE 06/12/21   Ralene Bathe, MD  ketoconazole (NIZORAL) 2 % shampoo APPLY TOPICALLY TO THE AFFECTED AREA EVERY 3 DAYS, LET SIT 5 MINUTES BEFORE RINSING OUT 06/12/21   Ralene Bathe, MD  lisinopril (ZESTRIL) 5 MG tablet TAKE 1 TABLET(5 MG) BY MOUTH DAILY 06/18/21   Minna Merritts, MD  meloxicam (MOBIC) 7.5 MG tablet Take 1  tablet (7.5 mg total) by mouth 2 (two) times daily. 03/02/21   Luiz Ochoa, NP  mometasone (ELOCON) 0.1 % lotion APPLY TO LUPUS IN SCALP DAILY AS NEEDED FOR FLARES 06/12/21   Ralene Bathe, MD  mometasone (ELOCON) 0.1 % ointment APPLY TO LUPUS SORES ON FACE TOPICALLY DAILY AS NEEDED FOR FLARES 07/11/21   Ralene Bathe, MD  montelukast (SINGULAIR) 10 MG tablet Take 1 tablet (10 mg total) by mouth at bedtime. 03/02/21   Luiz Ochoa, NP  mupirocin ointment Drue Stager) 2 % Apply to open area on skin qd-bid 02/01/21   Luiz Ochoa, NP  [facility] 4 MG/0.1ML LIQD nasal spray kit SMARTSIG:1 Spray(s) Both Nares Once PRN 08/14/20   [provider]  Norethindrone Acetate-Ethinyl Estradiol (AUROVELA 1.5/30) 1.5-30 MG-MCG tablet Take 1 tablet by mouth daily. 06/07/21   McDonough, Si Gaul, PA-C  oxyCODONE-acetaminophen (PERCOCET) 7.5-325 MG tablet Take 1 tablet by mouth every 4 (four) hours as needed for severe pain.    [provider]  Polyethyl Glycol-Propyl Glycol (SYSTANE ULTRA) 0.4-0.3 % SOLN Place 1 drop into both eyes daily as needed (for dry eyes).     [provider]  tacrolimus (PROTOPIC) 0.1 % ointment APPLY TOPICALLY TO THE AFFECTED AREA DAILY 04/24/21   Ralene Bathe, MD  zolpidem (AMBIEN) 5 MG tablet Take 1 tablet by mouth prior to sleep study 07/09/21   McDonough, Si Gaul, PA-C    Allergies  Allergen Reactions   Other Other (See Comments)   Sulfa Antibiotics    Cephalexin Itching    Per [facility] records    Family History  Problem Relation Age of Onset   Heart disease Father    Hypertension Father    Hypertension Mother    Thyroid disease Mother    Asthma Other    Depression Other    Diabetes Other    Migraines Other    Stroke Other     Social History Social History   Tobacco Use   Smoking status: Never   Smokeless tobacco: Never  Vaping Use   Vaping Use: Never used  Substance Use Topics   Alcohol use: No    Alcohol/week: 0.0  standard drinks   Drug use: No    Review of Systems Constitutional: Negative for fever ENT: Slight sore throat Cardiovascular: Negative for chest pain. Respiratory: Negative for shortness of breath.  Positive for dry cough. Gastrointestinal: Negative for abdominal pain, vomiting and diarrhea. Musculoskeletal: Negative for musculoskeletal complaints Skin: Negative for skin complaints  Neurological: Negative for headache All other ROS negative  ____________________________________________   PHYSICAL EXAM:  VITAL SIGNS: ED Triage Vitals  Enc Vitals Group     BP 07/23/21 0946 (!) 136/96     Pulse Rate 07/23/21 0946 98     Resp 07/23/21 0946 20     Temp 07/23/21 0946 98.4 F (36.9 C)     Temp Source 07/23/21 0946 Oral     SpO2 07/23/21 0946 99 %     Weight 07/23/21 0944 235 lb 14.3 oz (107 kg)     Height 07/23/21 0944 5' 6"  (1.676 m)     Head Circumference --      Peak Flow --      Pain Score 07/23/21 0944 0     Pain Loc --      Pain Edu? --      Excl. in [place]? --    Constitutional: Alert and oriented. Well appearing and in no distress. Eyes: Normal exam ENT      Head: Normocephalic and atraumatic.      Mouth/Throat: Mucous membranes are moist.  No oropharynx findings.  No erythema or exudate.  No cervical lymphadenopathy or tenderness to tracheal rocking. Cardiovascular: Normal rate, regular rhythm.  Respiratory: Normal respiratory effort without tachypnea nor retractions. Breath sounds are clear Gastrointestinal: Soft and nontender. No distention.  Musculoskeletal: Nontender with normal range of motion in all extremities.  Neurologic:  Normal speech and language. No gross focal neurologic deficits are appreciated. Skin:  Skin is warm, dry and intact.  Psychiatric: Mood and affect are normal. Speech and behavior are normal.     INITIAL IMPRESSION / ASSESSMENT AND PLAN / ED COURSE  Pertinent labs & imaging results that were available during my care of the patient were  reviewed by me and considered in my medical decision making (see chart for details).   Patient presents emergency department for 2 days of upper respiratory symptoms including dry cough slight sore throat.  Overall the  patient appears well, no distress.  Vitals are reassuring.  Physical exam reassuring.  We will swab for COVID/flu PCR.  The patient's wish to go home after the swab and we will call with results.  Patient's daughter has tested positive for COVID-19.  The patient has tested negative however as patient's symptoms started after her daughter is a very likely is just too early for the patient to test positive.  Given the patient's likelihood of having COVID-19 and her comorbidities such as lupus patient would be a higher risk patient.  We will prescribe paxlovid, with 5 days of COVID isolation.  CARISMA TROUPE was evaluated in Emergency Department on 07/23/2021 for the symptoms described in the history of present illness. She was evaluated in the context of the global COVID-19 pandemic, which necessitated consideration that the patient might be at risk for infection with the SARS-CoV-2 virus that causes COVID-19. Institutional protocols and algorithms that pertain to the evaluation of patients at risk for COVID-19 are in a state of rapid change based on information released by regulatory bodies including the CDC and federal and state organizations. These policies and algorithms were followed during the patient's care in the ED.  ____________________________________________   FINAL CLINICAL IMPRESSION(S) / ED DIAGNOSES  Upper respiratory illness   Harvest Dark, MD 07/23/21 1103    Harvest Dark, MD 07/23/21 1220

## 2021-07-23 NOTE — ED Triage Notes (Signed)
Pt to ED with dry cough since friday

## 2021-07-30 ENCOUNTER — Other Ambulatory Visit: Payer: Self-pay

## 2021-07-30 DIAGNOSIS — L930 Discoid lupus erythematosus: Secondary | ICD-10-CM

## 2021-07-30 MED ORDER — MUPIROCIN 2 % EX OINT
TOPICAL_OINTMENT | CUTANEOUS | 5 refills | Status: DC
Start: 2021-07-30 — End: 2021-12-17

## 2021-07-31 ENCOUNTER — Encounter (INDEPENDENT_AMBULATORY_CARE_PROVIDER_SITE_OTHER): Payer: Medicare Other | Admitting: Internal Medicine

## 2021-07-31 DIAGNOSIS — G4733 Obstructive sleep apnea (adult) (pediatric): Secondary | ICD-10-CM

## 2021-08-02 DIAGNOSIS — L930 Discoid lupus erythematosus: Secondary | ICD-10-CM | POA: Diagnosis not present

## 2021-08-02 DIAGNOSIS — M329 Systemic lupus erythematosus, unspecified: Secondary | ICD-10-CM | POA: Diagnosis not present

## 2021-08-02 DIAGNOSIS — Q0700 Arnold-Chiari syndrome without spina bifida or hydrocephalus: Secondary | ICD-10-CM | POA: Diagnosis not present

## 2021-08-02 DIAGNOSIS — I7300 Raynaud's syndrome without gangrene: Secondary | ICD-10-CM | POA: Diagnosis not present

## 2021-08-06 ENCOUNTER — Other Ambulatory Visit: Payer: Self-pay

## 2021-08-06 ENCOUNTER — Ambulatory Visit
Admission: RE | Admit: 2021-08-06 | Discharge: 2021-08-06 | Disposition: A | Discharge status: Home / Self Care | Payer: Medicare Other | Source: Ambulatory Visit | Attending: Physician Assistant | Admitting: Physician Assistant

## 2021-08-06 DIAGNOSIS — Z1231 Encounter for screening mammogram for malignant neoplasm of breast: Secondary | ICD-10-CM | POA: Diagnosis not present

## 2021-08-08 ENCOUNTER — Ambulatory Visit (INDEPENDENT_AMBULATORY_CARE_PROVIDER_SITE_OTHER): Payer: Medicare Other | Admitting: Nurse Practitioner

## 2021-08-08 ENCOUNTER — Encounter: Payer: Self-pay | Admitting: Nurse Practitioner

## 2021-08-08 ENCOUNTER — Other Ambulatory Visit: Payer: Self-pay

## 2021-08-08 VITALS — BP 140/66 | HR 100 | Temp 98.4°F | Resp 16 | Ht 66.0 in | Wt 237.8 lb

## 2021-08-08 DIAGNOSIS — J0191 Acute recurrent sinusitis, unspecified: Secondary | ICD-10-CM | POA: Diagnosis not present

## 2021-08-08 DIAGNOSIS — M329 Systemic lupus erythematosus, unspecified: Secondary | ICD-10-CM

## 2021-08-08 DIAGNOSIS — R059 Cough, unspecified: Secondary | ICD-10-CM | POA: Diagnosis not present

## 2021-08-08 MED ORDER — BENZONATATE 100 MG PO CAPS: 100.0000 mg | ORAL_CAPSULE | Freq: Two times a day (BID) | ORAL | 0 refills | Status: DC | PRN

## 2021-08-08 MED ORDER — AMOXICILLIN-POT CLAVULANATE 875-125 MG PO TABS
1.0000 | ORAL_TABLET | Freq: Two times a day (BID) | ORAL | 0 refills | Status: DC
Start: 2021-08-08 — End: 2021-08-23

## 2021-08-08 NOTE — Progress Notes (Signed)
[facility] [place], [place] [address]  Internal MEDICINE  Office Visit Note  Patient Name: Morgan Kent  [id number]  [id number]  Date of Service: 08/08/2021  Chief Complaint  Patient presents with   Acute Visit    Stuffy nose, congestion, eye pain, headaches, right ear hurts   Sinusitis     HPI Morgan Kent presents for an acute sick visit for symptoms of sinusitis. She reports sinus pain/pressure, headache, ear fullness and pain, pain behind the eyes, fatigue, nasal congestion and cough. She denies fever, chills, SOB, chest tightness, wheezing, nausea, vomiting, diarrhea, or rash.      Current Medication:  Outpatient Encounter Medications as of 08/08/2021  Medication Sig   albuterol (VENTOLIN HFA) 108 (90 Base) MCG/ACT inhaler Inhale 2 puffs into the lungs daily.   amLODipine (NORVASC) 5 MG tablet Take 1 tablet (5 mg total) by mouth daily.   amoxicillin-clavulanate (AUGMENTIN) 875-125 MG tablet Take 1 tablet by mouth 2 (two) times daily.   AUROVELA FE 1.5/30 1.5-30 MG-MCG tablet TAKE 1 TABLET BY MOUTH AT BEDTIME   benzonatate (TESSALON) 100 MG capsule Take 1 capsule (100 mg total) by mouth 2 (two) times daily as needed for cough.   Budesonide (PULMICORT FLEXHALER) 90 MCG/ACT inhaler Inhale 2 puffs into the lungs 2 (two) times daily.   carvedilol (COREG) 25 MG tablet TAKE 1 TABLET(25 MG) BY MOUTH TWICE DAILY   cetirizine (ZYRTEC) 10 MG tablet Take 1 tablet (10 mg total) by mouth daily.   clobetasol (TEMOVATE) 0.05 % external solution Apply 1 application topically 2 (two) times daily as needed. Apply to scalp   cyclobenzaprine (FLEXERIL) 10 MG tablet Take 1 tablet (10 mg total) by mouth 3 (three) times daily as needed.   diclofenac Sodium (VOLTAREN) 1 % GEL Apply 2 g topically 4 (four) times daily.   ferrous sulfate (FERROUSUL) 325 (65 FE) MG tablet Take 1 tablet (325 mg total) by mouth daily with breakfast.   fluticasone (FLONASE) 50 MCG/ACT nasal spray  Place 1 spray into both nostrils at bedtime.   gabapentin (NEURONTIN) 300 MG capsule Take 1 capsule (300 mg total) by mouth 3 (three) times daily.   hydrochlorothiazide (HYDRODIURIL) 12.5 MG tablet TAKE 1 TABLET(12.5 MG) BY MOUTH DAILY   hydroxychloroquine (PLAQUENIL) 200 MG tablet Take 200 mg by mouth daily.    ketoconazole (NIZORAL) 2 % shampoo APPLY TOPICALLY 1 TIME FOR 1 DOSE   ketoconazole (NIZORAL) 2 % shampoo APPLY TOPICALLY TO THE AFFECTED AREA EVERY 3 DAYS, LET SIT 5 MINUTES BEFORE RINSING OUT   lisinopril (ZESTRIL) 5 MG tablet TAKE 1 TABLET(5 MG) BY MOUTH DAILY   meloxicam (MOBIC) 7.5 MG tablet Take 1 tablet (7.5 mg total) by mouth 2 (two) times daily.   mometasone (ELOCON) 0.1 % lotion APPLY TO LUPUS IN SCALP DAILY AS NEEDED FOR FLARES   mometasone (ELOCON) 0.1 % ointment APPLY TO LUPUS SORES ON FACE TOPICALLY DAILY AS NEEDED FOR FLARES   montelukast (SINGULAIR) 10 MG tablet Take 1 tablet (10 mg total) by mouth at bedtime.   mupirocin ointment (BACTROBAN) 2 % Apply to open area on skin qd-bid   NARCAN 4 MG/0.1ML LIQD nasal spray kit SMARTSIG:1 Spray(s) Both Nares Once PRN   Norethindrone Acetate-Ethinyl Estradiol (AUROVELA 1.5/30) 1.5-30 MG-MCG tablet Take 1 tablet by mouth daily.   oxyCODONE-acetaminophen (PERCOCET) 7.5-325 MG tablet Take 1 tablet by mouth every 4 (four) hours as needed for severe pain.   Polyethyl Glycol-Propyl Glycol (SYSTANE ULTRA) 0.4-0.3 % SOLN  Place 1 drop into both eyes daily as needed (for dry eyes).    tacrolimus (PROTOPIC) 0.1 % ointment APPLY TOPICALLY TO THE AFFECTED AREA DAILY   zolpidem (AMBIEN) 5 MG tablet Take 1 tablet by mouth prior to sleep study   No facility-administered encounter medications on file as of 08/08/2021.      Medical History: Past Medical History:  Diagnosis Date   Anxiety    Arthritis    rheumo possible   Chiari malformation    Depression    Dizziness    Headache(784.0)    History of kidney stones    Hypertension     Lupus (HCC)    Lupus (HCC)    Nerve damage    NECK   PONV (postoperative nausea and vomiting)    nausea only   Sleep apnea    TMJ arthritis      Vital Signs: BP 140/66   Pulse 100   Temp 98.4 F (36.9 C)   Resp 16   Ht _0  (1.676 m)   Wt 237 lb 12.8 oz (107.9 kg)   SpO2 98%   BMI 38.38 kg/m    Review of Systems  Constitutional:  Positive for fatigue. Negative for fever.  HENT:  Positive for congestion, ear pain (ear fullness), rhinorrhea, sinus pressure, sinus pain, sneezing and sore throat. Negative for postnasal drip and trouble swallowing.   Eyes:  Positive for pain.  Respiratory:  Positive for cough. Negative for chest tightness, shortness of breath and wheezing.        Takes albuterol inhaler  Cardiovascular: Negative.  Negative for chest pain and palpitations.  Gastrointestinal: Negative.  Negative for abdominal pain, constipation, diarrhea, nausea and vomiting.  Musculoskeletal:  Positive for myalgias.  Skin: Negative.  Negative for rash.  Neurological:  Positive for headaches.   Physical Exam Vitals reviewed.  Constitutional:      General: She is awake. She is not in acute distress.    Appearance: Normal appearance. She is well-developed and well-groomed. She is obese. She is ill-appearing.  HENT:     Head: Normocephalic and atraumatic.     Right Ear: Ear canal and external ear normal. There is impacted cerumen.     Left Ear: Tympanic membrane, ear canal and external ear normal.     Nose: Mucosal edema, congestion and rhinorrhea present. Rhinorrhea is clear.     Right Turbinates: Enlarged (erythematous) and swollen.     Left Turbinates: Enlarged (erythematous) and swollen.     Right Sinus: Maxillary sinus tenderness and frontal sinus tenderness present.     Left Sinus: Maxillary sinus tenderness and frontal sinus tenderness present.     Mouth/Throat:     Lips: Pink.     Mouth: Mucous membranes are moist.     Dentition: Normal dentition.     Pharynx:  Uvula midline. Pharyngeal swelling (minimal, not severe) and posterior oropharyngeal erythema present. No uvula swelling.     Tonsils: No tonsillar exudate or tonsillar abscesses.  Eyes:     Extraocular Movements: Extraocular movements intact.     Conjunctiva/sclera: Conjunctivae normal.     Pupils: Pupils are equal, round, and reactive to light.  Neck:     Vascular: No carotid bruit.  Cardiovascular:     Rate and Rhythm: Normal rate and regular rhythm.     Pulses: Normal pulses.     Heart sounds: Normal heart sounds. No murmur heard. Pulmonary:     Effort: Pulmonary effort is normal. No respiratory distress.  Breath sounds: Normal breath sounds. No wheezing.  Musculoskeletal:     Cervical back: Normal range of motion.  Lymphadenopathy:     Cervical: Cervical adenopathy present.  Skin:    General: Skin is warm and dry.     Coloration: Skin is not pale.  Neurological:     Mental Status: She is alert and oriented to person, place, and time.     Cranial Nerves: No cranial nerve deficit.     Coordination: Coordination normal.     Gait: Gait normal.  Psychiatric:        Mood and Affect: Mood normal.        Behavior: Behavior normal. Behavior is cooperative.      Assessment/Plan: 1. Acute recurrent sinusitis, unspecified location Empiric antibiotic treatment prescribed due to clinical presentation - amoxicillin-clavulanate (AUGMENTIN) 875-125 MG tablet; Take 1 tablet by mouth 2 (two) times daily.  Dispense: 20 tablet; Refill: 0  2. Cough Symptomatic treatment prescribed for cough - benzonatate (TESSALON) 100 MG capsule; Take 1 capsule (100 mg total) by mouth 2 (two) times daily as needed for cough.  Dispense: 30 capsule; Refill: 0  3. Lupus ([place]) Patient has lupus and is more susceptible to infections, also symptoms of infection are often worse for her per patient report.   General Counseling: Aaylah verbalizes understanding of the findings of todays visit and agrees with  plan of treatment. I have discussed any further diagnostic evaluation that may be needed or ordered today. We also reviewed her medications today. she has been encouraged to call the office with any questions or concerns that should arise related to todays visit.    Counseling:    No orders of the defined types were placed in this encounter.   Meds ordered this encounter  Medications   amoxicillin-clavulanate (AUGMENTIN) 875-125 MG tablet    Sig: Take 1 tablet by mouth 2 (two) times daily.    Dispense:  20 tablet    Refill:  0   benzonatate (TESSALON) 100 MG capsule    Sig: Take 1 capsule (100 mg total) by mouth 2 (two) times daily as needed for cough.    Dispense:  30 capsule    Refill:  0    Return if symptoms worsen or fail to improve.  [place] Controlled Substance Database was reviewed by me for overdose risk score (ORS)  Time spent:20 Minutes Time spent with patient included reviewing progress notes, labs, imaging studies, and discussing plan for follow up.   This patient was seen by Jonetta Osgood, FNP-C in collaboration with Dr. Clayborn Bigness as a part of collaborative care agreement.  Manami Tutor R. Valetta Fuller, MSN, FNP-C Internal Medicine

## 2021-08-08 NOTE — Procedures (Signed)
[place] Report Part I                                                                 Phone: [phone] Fax: [phone]  Patient Name: Morgan Kent, Morgan Kent Acquisition Number: [id number]  Date of Birth: 24-May-1981 Acquisition Date: 07/31/2021  Referring Physician: Drema Dallas, PA-C     History: The patient is a 40 year old female who was referred for re-evaluation of possible sleep apnea. Medical History: anxiety, arthritis, depression, dizziness, headache, hypertension, nerve damage.  Medications: Norvasc, Ventolin hfa, Aurovela, Pulmicort flexhaler, Coreg, Zyrtec, Temovate, Flexeril, Voltaren, ferrousul, Vlonase, Neurontin, Hydrodiuril, Plaqueril, Nizoral, Zestril, Mobic, Elocon, Singulair, Bactroban, Narcan, Percocet.  Procedure: This routine overnight polysomnogram was performed on the Alice 5 using the standard diagnostic protocol. This included 6 channels of EEG, 2 channels of EOG, chin EMG, bilateral anterior tibialis EMG, nasal/oral thermistor, PTAF (nasal pressure transducer), chest and abdominal wall movements, EKG, and pulse oximetry.  Description: The total recording time was 457.7 minutes. The total sleep time was 405.8 minutes. There were a total of 43.0 minutes of wakefulness after sleep onset for a slightly reducedsleep efficiency of 88.7%. The latency to sleep onset was shortat 8.9 minutes. The R sleep onset latency was within normal limits at 73.5 minutes. Sleep parameters, as a percentage of the total sleep time, demonstrated 2.1% of sleep was in N1 sleep, 69.1% N2, 9.7% N3 and 19.1% R sleep. There were a total of 10 arousals for an arousal index of 1.5 arousals per hour of sleep that was normal.  Respiratory monitoring demonstrated no significant snoring in any position. There were 40 apneas and hypopneas for an Apnea Hypopnea Index of 5.9 apneas and hypopneas per hour of sleep. The REM related apnea hypopnea index was 22.5/hr of REM sleep  compared to a NREM AHI of 2.0/hr.  The average duration of the respiratory events was 16.0 seconds with a maximum duration of 62.0 seconds. The respiratory events occurred in all positions. The respiratory events were associated with peripheral oxygen desaturations on the average to 89%. The lowest oxygen desaturation associated with a respiratory event was 84%. Additionally, the baseline oxygen saturation during wakefulness was 93%, during NREM sleep averaged 97%, and during REM sleep averaged  96%. The total duration of oxygen < 90% was 3.0 minutes.  Cardiac monitoring- did not demonstrate transient cardiac decelerations associated with the apneas. Frequent, multifocal PVCs with bigeminy and couplets were observed throughout the study.   Periodic limb movement monitoring- did not demonstrate periodic limb movements.    Impression: This repeat overnight polysomnogram demonstrated significant, REM-related obstructive sleep apnea with an overall Apnea Hypopnea Index of 5.9 apneas and hypopneas per hour of sleep, which increased to 22.5 in REM sleep.  There was a slightly reduced sleep efficiency with a reduced percentage of slow wave sleep. These findings would appear to be due to the obstructive sleep apnea.  Limited EKG monitoring demonstrated frequent, multifocal PVCs with bigeminy and couplets throughout the study. Cardiac evaluation is suggested if this is a new finding.  Recommendations:    A CPAP titration would be recommended for the sleep apnea. Would recommend weight loss in a patient with a BMI of 37.1. Alternative treatment options may include an oral  appliance, a nasal resistance device, or ENT surgery in the appropriate clinical context.     Allyne Gee, MD, [facility] Diplomate ABMS-Pulmonary, Critical Care and Sleep Medicine  Electronically reviewed and digitally signed    [place] Report Part II  Phone: [phone] Fax: [phone]  Patient last name Dorning Neck Size 15.0 in. Acquisition [phone]  Patient first name Evora Weight 230.0 lbs. Started 07/31/2021 at 9:28:52 PM  Birth date 1981/08/21 Height 66.0 in. Stopped 08/01/2021 at 5:11:34 AM  Age 48 BMI 37.1 lb/in2 Duration 457.7  Study Type Adult      Report Generated by: Forde Radon, RPSGT Sleep Data: Lights Out: 9:32:58 PM Sleep Onset: 9:41:52 PM  Lights On: 5:10:40 AM Sleep Efficiency: 88.7 %  Total Recording Time: 457.7 min Sleep Latency (from Lights Off) 8.9 min  Total Sleep Time (TST): 405.8 min R Latency (from Sleep Onset): 73.5 min  Sleep Period Time: 448.8 min Total number of awakenings: 9  Wake during sleep: 43.0 min Wake After Sleep Onset (WASO): 43.0 min   Sleep Data:         Arousal Summary: Stage  Latency from lights out (min) Latency from sleep onset (min) Duration (min) % Total Sleep Time  Normal values  N 1 8.9 0.0 8.5 2.1 (5%)  N 2 11.4 2.5 280.3 69.1 (50%)  N 3 118.4 109.5 39.5 9.7 (20%)  R 82.4 73.5 77.5 19.1 (25%)    Number Index  Spontaneous 14 2.1  Apneas & Hypopneas 4 0.6  RERAs 0 0.0       (Apneas & Hypopneas & RERAs)  (4) (0.6)  Limb Movement 0 0.0  Snore 0 0.0  TOTAL 18 2.7     Respiratory Data:  CA OA MA Apnea Hypopnea* A+ H RERA Total  Number 1 11 0 12 28 40 0 40  Mean Dur (sec) 10.0 10.1 0.0 10.1 18.5 16.0 0.0 16.0  Max Dur (sec) 10.0 11.0 0.0 11.0 62.0 62.0 0.0 62.0  Total Dur (min) 0.2 1.8 0.0 2.0 8.6 10.6 0.0 10.6  % of TST 0.0 0.5 0.0 0.5 2.1 2.6 0.0 2.6  Index (#/h TST) 0.1 1.6 0.0 1.8 4.1 5.9 0.0 5.9  *Hypopneas scored based on 4% or greater desaturation.  Sleep Stage:        REM NREM TST  AHI 22.5 2.0 5.9  RDI 22.5 2.0 5.9           Body Position Data:  Sleep (min) TST (%) REM (min) NREM (min) CA (#) OA (#) MA (#) HYP (#) AHI (#/h) RERA (#) RDI (#/h) Desat (#)  Supine 307.0 75.65 49.5 257.5 1 11  0 17 5.7 0 5.7 21  Non-Supine 98.80 24.35 28.00 70.80 0.00 0.00 0.00 11.00 6.68 0  6.68 12.00  Left: 98.8 24.35 28.0 70.8 0 0 0 11 6.7 0 6.7 12     Snoring: Total number of snoring episodes  0  Total time with snoring    min (   % of sleep)   Oximetry Distribution:             WK REM NREM TOTAL  Average (%)   93 96 97 96  < 90% 2.3 0.7 0.0 3.0  < 80% 1.6 0.0 0.0 1.6  < 70% 1.2 0.0 0.0 1.2  # of Desaturations* 0 18 15 33  Desat Index (#/hour) 0.0 19.0 2.7 5.1  Desat Max (%) 0 9 8 9   Desat Max Dur (sec) 0.0  102.0 81.0 102.0  Approx Min O2 during sleep 70  Approx min O2 during a respiratory event 84  Was Oxygen added (Y/N) and final rate No:   0 LPM  *Desaturations based on 4% or greater drop from baseline.   Cheyne Stokes Breathing: None Present   Heart Rate Summary:  Average Heart Rate During Sleep 104.5 bpm      Highest Heart Rate During Sleep (95th %) 110.0 bpm      Highest Heart Rate During Sleep 115 bpm      Highest Heart Rate During Recording (TIB) 148 bpm       Heart Rate Observations: Event Type # Events   Bradycardia 0 Lowest HR Scored: N/A  Sinus Tachycardia During Sleep 0 Highest HR Scored: N/A  Narrow Complex Tachycardia 0 Highest HR Scored: N/A  Wide Complex Tachycardia 0 Highest HR Scored: N/A  Asystole 0 Longest Pause: N/A  Atrial Fibrillation 0 Duration Longest Event: N/A  Other Arrythmias  No Type:    Periodic Limb Movement Data: (Primary legs unless otherwise noted) Total # Limb Movement 0 Limb Movement Index 0.0  Total # PLMS    PLMS Index     Total # PLMS Arousals    PLMS Arousal Index     Percentage Sleep Time with PLMS   min (   % sleep)  Mean Duration limb movements (secs)

## 2021-08-10 DIAGNOSIS — G894 Chronic pain syndrome: Secondary | ICD-10-CM | POA: Diagnosis not present

## 2021-08-10 DIAGNOSIS — Z79891 Long term (current) use of opiate analgesic: Secondary | ICD-10-CM | POA: Diagnosis not present

## 2021-08-13 ENCOUNTER — Other Ambulatory Visit: Payer: Self-pay | Admitting: Physician Assistant

## 2021-08-13 DIAGNOSIS — R928 Other abnormal and inconclusive findings on diagnostic imaging of breast: Secondary | ICD-10-CM

## 2021-08-13 DIAGNOSIS — R2231 Localized swelling, mass and lump, right upper limb: Secondary | ICD-10-CM

## 2021-08-14 ENCOUNTER — Encounter (INDEPENDENT_AMBULATORY_CARE_PROVIDER_SITE_OTHER): Payer: Medicare Other | Admitting: Internal Medicine

## 2021-08-14 DIAGNOSIS — G4733 Obstructive sleep apnea (adult) (pediatric): Secondary | ICD-10-CM

## 2021-08-22 NOTE — Procedures (Signed)
[place] Report Part I  Phone: [phone] Fax: [phone]  Patient Name: Morgan Kent, Morgan Kent Acquisition Number: [id number]  Date of Birth: 10/15/1981 Acquisition Date: 08/14/2021  Referring Physician: Drema Dallas, PA-C     History: The patient is a 40 year old female with obstructive sleep apnea for CPAP titration. Medical History: anxiety, arthritis, depression, dizziness, headache, hypertension, nerve damage.  Medications: Norvasc, Ventolin Hfa, Aurovela Fe, Pulmicort Flexhaler, Coreg, Zyrtec, Temovate, Flexeril, Voltaren, Ferrousul., Flonase, Neurontin, Hydrodiuril, Plaqueril, Nozorat, Zestril, Mobic, Elocon, Singulair, Bactroban, Narcan, Aurovela, Percocet.  Procedure: This routine overnight polysomnogram was performed on the Alice 5 using the standard CPAP protocol. This included 6 channels of EEG, 2 channels of EOG, chin EMG, bilateral anterior tibialis EMG, nasal/oral thermistor, PTAF (nasal pressure transducer), chest and abdominal wall movements, EKG, and pulse oximetry.  Description: The total recording time was 420.8 minutes. The total sleep time was 319.9 minutes. There were a total of 94.0 minutes of wakefulness after sleep onset for a reducedsleep efficiency of 76.0%. The latency to sleep onset was shortat 6.9 minutes. The R sleep onset latency was within normal limits at 117.5 minutes. Sleep parameters, as a percentage of the total sleep time, demonstrated 8.1% of sleep was in N1 sleep, 65.6% N2, 4.5% N3 and 21.7% R sleep. There were a total of 35 arousals for an arousal index of 6.6 arousals per hour of sleep that was normal.  Overall, there were a total of 25 respiratory events for a respiratory disturbance index, which includes apneas, hypopneas and RERAs (increased respiratory effort) of 4.7 respiratory events per hour of sleep during the pressure titration. CPAP was initiated at 5 cm H2O at lights out, 10:11 p.m. The apnea was well  controlled at this pressure in non-REM sleep, however respiratory events reemerged in REM sleep.  The pressure was further titrated in 1-2 cm increments to the final pressure of 10 cm H2O. Some REM-related central hypopneas persisted in the early morning REM just prior to awakening.  Additionally, the baseline oxygen saturation during wakefulness was 98%, during NREM sleep averaged 98%, and during REM sleep averaged 98%. The total duration of oxygen < 90% was 0.0 minutes.  Cardiac monitoring- occasional multi-focal PVCs were observed.   Periodic limb movement monitoring- demonstrated that there were 17 periodic limb movements for a periodic limb movement index of 3.2 periodic limb movements per hour of sleep.   Impression: This patient's obstructive sleep apnea demonstrated significant improvement with the utilization of nasal CPAP at 5 cm H2O in non-REM sleep, however they persisted at 10 cm H2O in REM.   There were few periodic limb movements that commonly are not significant. Clinical correlation would be suggested.   Recommendations: Would recommend utilization of auto-adjusting  CPAP at 5-15 cm H2O.      A ResMed F20 small mask was used. Chin strap used during study- no. Humidifier used during study- yes.     Allyne Gee, MD, [facility] Diplomate ABMS-Pulmonary, Critical Care and Sleep Medicine  Electronically reviewed and digitally signed    [facility] CPAP/BIPAP Polysomnogram Report Part II Phone: [phone] Fax: [phone]  Patient last name Morgan Kent Neck Size 15.0 in. Acquisition [phone]  Patient first name Morgan Kent Weight 230.0 lbs. Started 08/14/2021 at 10:05:30 PM  Birth date Mar 25, 1981 Height 66.0 in. Stopped 08/15/2021 at 5:12:42 AM  Age 40      Type Adult BMI 37.1 lb/in2 Duration 420.8  Report Generated by: Forde Radon,  RPSGT Sleep Data: Lights Out: 10:11:06 PM Sleep Onset: 10:18:00 PM  Lights On: 5:11:54 AM Sleep Efficiency: 76.0 %  Total Recording Time:  420.8 min Sleep Latency (from Lights Off) 6.9 min  Total Sleep Time (TST): 319.9 min R Latency (from Sleep Onset): 117.5 min  Sleep Period Time: 413.9 min Total number of awakenings: 20  Wake during sleep: 94.0 min Wake After Sleep Onset (WASO): 94.0 min   Sleep Data:         Arousal Summary: Stage  Latency from lights out (min) Latency from sleep onset (min) Duration (min) % Total Sleep Time  Normal values  N 1 6.9 0.0 26.0 8.1 (5%)  N 2 10.9 4.0 209.9 65.6 (50%)  N 3 37.4 30.5 14.5 4.5 (20%)  R 124.4 117.5 69.5 21.7 (25%)    Number Index  Spontaneous 49 9.2  Apneas & Hypopneas 5 0.9  RERAs 0 0.0       (Apneas & Hypopneas & RERAs)  (5) (0.9)  Limb Movement 0 0.0  Snore 0 0.0  TOTAL 54 10.1     Respiratory Data:  CA OA MA Apnea Hypopnea* A+ H RERA Total  Number 1 0 0 1 24 25  0 25  Mean Dur (sec) 14.0 0.0 0.0 14.0 31.8 31.1 0.0 31.1  Max Dur (sec) 14.0 0.0 0.0 14.0 77.0 77.0 0.0 77.0  Total Dur (min) 0.2 0.0 0.0 0.2 12.7 12.9 0.0 12.9  % of TST 0.1 0.0 0.0 0.1 4.0 4.0 0.0 4.0  Index (#/h TST) 0.2 0.0 0.0 0.2 4.5 4.7 0.0 4.7  *Hypopneas scored based on 4% or greater desaturation.  Sleep Stage:         REM NREM TST  AHI 21.6 0.0 4.7  RDI 21.6 0.0 4.7    Sleep (min) TST (%) REM (min) NREM (min) CA (#) OA (#) MA (#) HYP (#) AHI (#/h) RERA (#) RDI (#/h) Desat (#)  Supine 299.7 93.69 69.5 230.2 1 0 0 24 5.0 0 5.0 25  Non-Supine 20.20 6.31 0.00 20.20 0.00 0.00 0.00 0.00 0.00 0 0.00 0.00  Left: 20.2 6.31 0.0 20.2 0 0 0 0 0.0 0 0.00 0     Snoring: Total number of snoring episodes  0  Total time with snoring    min (   % of sleep)   Oximetry Distribution:             WK REM NREM TOTAL  Average (%)   98 98 98 98  < 90% 0.0 0.0 0.0 0.0  < 80% 0.0 0.0 0.0 0.0  < 70% 0.0 0.0 0.0 0.0  # of Desaturations* 0 25 0 25  Desat Index (#/hour) 0.0 21.6 0.0 4.7  Desat Max (%) 0 5 0 5  Desat Max Dur (sec) 0.0 96.0 0.0 96.0  Approx Min O2 during sleep 93  Approx min  O2 during a respiratory event 93  Was Oxygen added (Y/N) and final rate No:   0 LPM  *Desaturations based on 3% or greater drop from baseline.   Cheyne Stokes Breathing: None Present   Heart Rate Summary:  Average Heart Rate During Sleep 96.7 bpm      Highest Heart Rate During Sleep (95th %) 101.0 bpm      Highest Heart Rate During Sleep 108 bpm      Highest Heart Rate During Recording (TIB) 130 bpm       Heart Rate Observations: Event Type # Events   Bradycardia 0 Lowest HR  Scored: N/A  Sinus Tachycardia During Sleep 0 Highest HR Scored: N/A  Narrow Complex Tachycardia 0 Highest HR Scored: N/A  Wide Complex Tachycardia 0 Highest HR Scored: N/A  Asystole 0 Longest Pause: N/A  Atrial Fibrillation 0 Duration Longest Event: N/A  Other Arrythmias  No Type:   Periodic Limb Movement Data: (Primary legs unless otherwise noted) Total # Limb Movement 41 Limb Movement Index 7.7  Total # PLMS 17 PLMS Index 3.2  Total # PLMS Arousals    PLMS Arousal Index     Percentage Sleep Time with PLMS 9.17min (2.9 % sleep)  Mean Duration limb movements (secs) 186.3    IPAP Level (cmH2O) EPAP Level (cmH2O) Total Duration (min) Sleep Duration (min) Sleep (%) REM (%) CA  #) OA # MA # HYP #) AHI (#/hr) RERAs # RERAs (#/hr) RDI (#/hr)  5 5 126.8 120.3 94.9 7.3 0 0 0 2 1.0 0 0.0 1.0  6 6 18.3 16.3 89.1 86.9 0 0 0 5 18.4 0 0.0 18.4  7 7  118.7 97.7 82.3 4.0 0 0 0 2 1.2 0 0.0 1.2  8 8  110.7 52.2 47.2 7.9 0 0 0 3 3.4 0 0.0 3.4  9 9  11.5 11.0 95.7 95.7 0 0 0 6 32.7 0 0.0 32.7  10 10  20.8 20.8 100.0 88.5 1 0 0 6 20.2 0 0.0 20.2

## 2021-08-23 ENCOUNTER — Telehealth: Payer: Self-pay

## 2021-08-23 ENCOUNTER — Ambulatory Visit (INDEPENDENT_AMBULATORY_CARE_PROVIDER_SITE_OTHER): Payer: Medicare Other | Admitting: Physician Assistant

## 2021-08-23 ENCOUNTER — Other Ambulatory Visit: Payer: Self-pay

## 2021-08-23 ENCOUNTER — Encounter: Payer: Self-pay | Admitting: Physician Assistant

## 2021-08-23 DIAGNOSIS — Z0001 Encounter for general adult medical examination with abnormal findings: Secondary | ICD-10-CM

## 2021-08-23 DIAGNOSIS — R300 Dysuria: Secondary | ICD-10-CM

## 2021-08-23 DIAGNOSIS — G4710 Hypersomnia, unspecified: Secondary | ICD-10-CM

## 2021-08-23 DIAGNOSIS — Z3041 Encounter for surveillance of contraceptive pills: Secondary | ICD-10-CM | POA: Diagnosis not present

## 2021-08-23 DIAGNOSIS — G4733 Obstructive sleep apnea (adult) (pediatric): Secondary | ICD-10-CM

## 2021-08-23 DIAGNOSIS — J4520 Mild intermittent asthma, uncomplicated: Secondary | ICD-10-CM

## 2021-08-23 DIAGNOSIS — J309 Allergic rhinitis, unspecified: Secondary | ICD-10-CM

## 2021-08-23 DIAGNOSIS — M792 Neuralgia and neuritis, unspecified: Secondary | ICD-10-CM | POA: Diagnosis not present

## 2021-08-23 DIAGNOSIS — M329 Systemic lupus erythematosus, unspecified: Secondary | ICD-10-CM

## 2021-08-23 DIAGNOSIS — Z1231 Encounter for screening mammogram for malignant neoplasm of breast: Secondary | ICD-10-CM

## 2021-08-23 DIAGNOSIS — M064 Inflammatory polyarthropathy: Secondary | ICD-10-CM

## 2021-08-23 DIAGNOSIS — I10 Essential (primary) hypertension: Secondary | ICD-10-CM | POA: Diagnosis not present

## 2021-08-23 DIAGNOSIS — E538 Deficiency of other specified B group vitamins: Secondary | ICD-10-CM

## 2021-08-23 MED ORDER — PULMICORT FLEXHALER 90 MCG/ACT IN AEPB: 2.0000 | INHALATION_SPRAY | Freq: Two times a day (BID) | RESPIRATORY_TRACT | 1 refills | Status: DC

## 2021-08-23 MED ORDER — FLUTICASONE PROPIONATE 50 MCG/ACT NA SUSP: 1.0000 | Freq: Every day | NASAL | 3 refills | Status: DC

## 2021-08-23 MED ORDER — NORETHINDRONE ACET-ETHINYL EST 1.5-30 MG-MCG PO TABS: 1.0000 | ORAL_TABLET | Freq: Every day | ORAL | 4 refills | Status: DC

## 2021-08-23 MED ORDER — DICLOFENAC SODIUM 1 % EX GEL: 2.0000 g | Freq: Four times a day (QID) | CUTANEOUS | 2 refills | Status: DC

## 2021-08-23 MED ORDER — GABAPENTIN 300 MG PO CAPS: 300.0000 mg | ORAL_CAPSULE | Freq: Three times a day (TID) | ORAL | 0 refills | Status: DC

## 2021-08-23 MED ORDER — ALBUTEROL SULFATE HFA 108 (90 BASE) MCG/ACT IN AERS: 2.0000 | INHALATION_SPRAY | Freq: Every day | RESPIRATORY_TRACT | 5 refills | Status: DC

## 2021-08-23 NOTE — Telephone Encounter (Signed)
Signed ultrasound order manually faxed back to [facility], sent to be scanned-[name]

## 2021-08-23 NOTE — Progress Notes (Signed)
[facility] [place], [place] [address]  Internal MEDICINE  Office Visit Note  Patient Name: Morgan Kent  [id number]  [id number]  Date of Service: 08/27/2021  Chief Complaint  Patient presents with   Follow-up    Sleep study     HPI Pt is here for follow up for sleep studies -She was found to have overall mild OSA but moderate during REM. Will start on APAP 5-15cm H2O -Has a cardiologist and will follow up based on Psg results showing frequent PVCs with bigeminy and couplets -Her BP is well controlled -Does request several refills today -mammogram did show possible mass in right axilla with recommendation for [place] which has been ordered and will be scheduled  Current Medication: Outpatient Encounter Medications as of 08/23/2021  Medication Sig   amLODipine (NORVASC) 5 MG tablet Take 1 tablet (5 mg total) by mouth daily.   AUROVELA FE 1.5/30 1.5-30 MG-MCG tablet TAKE 1 TABLET BY MOUTH AT BEDTIME   carvedilol (COREG) 25 MG tablet TAKE 1 TABLET(25 MG) BY MOUTH TWICE DAILY   cetirizine (ZYRTEC) 10 MG tablet Take 1 tablet (10 mg total) by mouth daily.   clobetasol (TEMOVATE) 0.05 % external solution Apply 1 application topically 2 (two) times daily as needed. Apply to scalp   cyclobenzaprine (FLEXERIL) 10 MG tablet Take 1 tablet (10 mg total) by mouth 3 (three) times daily as needed.   ferrous sulfate (FERROUSUL) 325 (65 FE) MG tablet Take 1 tablet (325 mg total) by mouth daily with breakfast.   hydrochlorothiazide (HYDRODIURIL) 12.5 MG tablet TAKE 1 TABLET(12.5 MG) BY MOUTH DAILY   hydroxychloroquine (PLAQUENIL) 200 MG tablet Take 200 mg by mouth daily.    ketoconazole (NIZORAL) 2 % shampoo APPLY TOPICALLY 1 TIME FOR 1 DOSE   ketoconazole (NIZORAL) 2 % shampoo APPLY TOPICALLY TO THE AFFECTED AREA EVERY 3 DAYS, LET SIT 5 MINUTES BEFORE RINSING OUT   lisinopril (ZESTRIL) 5 MG tablet TAKE 1 TABLET(5 MG) BY MOUTH DAILY   meloxicam (MOBIC) 7.5 MG tablet Take 1  tablet (7.5 mg total) by mouth 2 (two) times daily.   mometasone (ELOCON) 0.1 % lotion APPLY TO LUPUS IN SCALP DAILY AS NEEDED FOR FLARES   mometasone (ELOCON) 0.1 % ointment APPLY TO LUPUS SORES ON FACE TOPICALLY DAILY AS NEEDED FOR FLARES   montelukast (SINGULAIR) 10 MG tablet Take 1 tablet (10 mg total) by mouth at bedtime.   mupirocin ointment (BACTROBAN) 2 % Apply to open area on skin qd-bid   NARCAN 4 MG/0.1ML LIQD nasal spray kit SMARTSIG:1 Spray(s) Both Nares Once PRN   oxyCODONE-acetaminophen (PERCOCET) 7.5-325 MG tablet Take 1 tablet by mouth every 4 (four) hours as needed for severe pain.   Polyethyl Glycol-Propyl Glycol (SYSTANE ULTRA) 0.4-0.3 % SOLN Place 1 drop into both eyes daily as needed (for dry eyes).    tacrolimus (PROTOPIC) 0.1 % ointment APPLY TOPICALLY TO THE AFFECTED AREA DAILY   [DISCONTINUED] albuterol (VENTOLIN HFA) 108 (90 Base) MCG/ACT inhaler Inhale 2 puffs into the lungs daily.   [DISCONTINUED] amoxicillin-clavulanate (AUGMENTIN) 875-125 MG tablet Take 1 tablet by mouth 2 (two) times daily.   [DISCONTINUED] benzonatate (TESSALON) 100 MG capsule Take 1 capsule (100 mg total) by mouth 2 (two) times daily as needed for cough.   [DISCONTINUED] Budesonide (PULMICORT FLEXHALER) 90 MCG/ACT inhaler Inhale 2 puffs into the lungs 2 (two) times daily.   [DISCONTINUED] diclofenac Sodium (VOLTAREN) 1 % GEL Apply 2 g topically 4 (four) times daily.   [  DISCONTINUED] fluticasone (FLONASE) 50 MCG/ACT nasal spray Place 1 spray into both nostrils at bedtime.   [DISCONTINUED] gabapentin (NEURONTIN) 300 MG capsule Take 1 capsule (300 mg total) by mouth 3 (three) times daily.   [DISCONTINUED] Norethindrone Acetate-Ethinyl Estradiol (AUROVELA 1.5/30) 1.5-30 MG-MCG tablet Take 1 tablet by mouth daily.   [DISCONTINUED] zolpidem (AMBIEN) 5 MG tablet Take 1 tablet by mouth prior to sleep study   albuterol (VENTOLIN HFA) 108 (90 Base) MCG/ACT inhaler Inhale 2 puffs into the lungs daily.    Budesonide (PULMICORT FLEXHALER) 90 MCG/ACT inhaler Inhale 2 puffs into the lungs 2 (two) times daily.   diclofenac Sodium (VOLTAREN) 1 % GEL Apply 2 g topically 4 (four) times daily.   fluticasone (FLONASE) 50 MCG/ACT nasal spray Place 1 spray into both nostrils at bedtime.   gabapentin (NEURONTIN) 300 MG capsule Take 1 capsule (300 mg total) by mouth 3 (three) times daily.   Norethindrone Acetate-Ethinyl Estradiol (AUROVELA 1.5/30) 1.5-30 MG-MCG tablet Take 1 tablet by mouth daily.   No facility-administered encounter medications on file as of 08/23/2021.    Surgical History: Past Surgical History:  Procedure Laterality Date   carpel tunnel  Left 2014   CRANIOTOMY  14,06   chiari   DENTAL SURGERY     HERNIA REPAIR     umbilical   PLACEMENT OF LUMBAR DRAIN N/A 07/07/2014   Procedure: PLACEMENT OF LUMBAR DRAIN AND CLOSURE OF CERVICAL INCISION;  Surgeon: Newman Pies, MD;  Location: [place] [facility];  Service: Neurosurgery;  Laterality: N/A;   SUBOCCIPITAL CRANIECTOMY CERVICAL LAMINECTOMY N/A 09/29/2013   Procedure: SUBOCCIPITAL CRANIECTOMY CERVICAL LAMINECTOMY/DURAPLASTY;  Surgeon: Ophelia Charter, MD;  Location: [place] [facility];  Service: Neurosurgery;  Laterality: N/A;  posterior   SUBOCCIPITAL CRANIECTOMY CERVICAL LAMINECTOMY N/A 06/23/2014   Procedure: SUBOCCIPITAL CRANIECTOMY CERVICAL LAMINECTOMY/DURAPLASTY;  Surgeon: Newman Pies, MD;  Location: [place] [facility];  Service: Neurosurgery;  Laterality: N/A;  suboccipital craniectomy with cervical laminectomy and duraplasty    Medical History: Past Medical History:  Diagnosis Date   Anxiety    Arthritis    rheumo possible   Chiari malformation    Depression    Dizziness    Headache(784.0)    History of kidney stones    Hypertension    Lupus (HCC)    Lupus (HCC)    Nerve damage    NECK   PONV (postoperative nausea and vomiting)    nausea only   Sleep apnea    TMJ arthritis     Family History: Family History  Problem  Relation Age of Onset   Heart disease Father    Hypertension Father    Hypertension Mother    Thyroid disease Mother    Asthma Other    Depression Other    Diabetes Other    Migraines Other    Stroke Other     Social History   Socioeconomic History   Marital status: Single    Spouse name: Not on file   Number of children: Not on file   Years of education: Not on file   Highest education level: Not on file  Occupational History   Not on file  Tobacco Use   Smoking status: Never   Smokeless tobacco: Never  Vaping Use   Vaping Use: Never used  Substance and Sexual Activity   Alcohol use: No    Alcohol/week: 0.0 standard drinks   Drug use: No   Sexual activity: Not on file  Other Topics Concern   Not on file  Social History Narrative   Lives with 3 children in 2 story home.  Unemployed.  Trying to get disability.  Education: high school.   Social Determinants of Health   Financial Resource Strain: Not on file  Food Insecurity: Not on file  Transportation Needs: Not on file  Physical Activity: Not on file  Stress: Not on file  Social Connections: Not on file  Intimate Partner Violence: Not on file      Review of Systems  Constitutional:  Positive for fatigue. Negative for chills and unexpected weight change.  HENT:  Negative for congestion, postnasal drip, rhinorrhea, sneezing and sore throat.   Eyes:  Negative for redness.  Respiratory:  Negative for cough, chest tightness and shortness of breath.   Cardiovascular:  Negative for chest pain and palpitations.  Gastrointestinal:  Negative for abdominal pain, constipation, diarrhea, nausea and vomiting.  Genitourinary:  Negative for dysuria and frequency.  Musculoskeletal:  Positive for arthralgias. Negative for back pain, joint swelling and neck pain.  Skin:  Negative for rash.  Neurological: Negative.  Negative for tremors and numbness.  Hematological:  Negative for adenopathy. Does not bruise/bleed easily.   Psychiatric/Behavioral:  Positive for sleep disturbance. Negative for behavioral problems (Depression) and suicidal ideas. The patient is not nervous/anxious.    Vital Signs: BP 136/84   Pulse 95   Temp 97.8 F (36.6 C)   Resp 16   Ht 5' 6"  (1.676 m)   Wt 240 lb (108.9 kg)   SpO2 98%   BMI 38.74 kg/m    Physical Exam Vitals and nursing note reviewed.  Constitutional:      General: She is not in acute distress.    Appearance: She is well-developed. She is obese. She is not diaphoretic.  HENT:     Head: Normocephalic and atraumatic.     Mouth/Throat:     Pharynx: No oropharyngeal exudate.  Eyes:     Pupils: Pupils are equal, round, and reactive to light.  Neck:     Thyroid: No thyromegaly.     Vascular: No JVD.     Trachea: No tracheal deviation.  Cardiovascular:     Rate and Rhythm: Normal rate and regular rhythm.     Heart sounds: Normal heart sounds. No murmur heard.   No friction rub. No gallop.  Pulmonary:     Effort: Pulmonary effort is normal. No respiratory distress.     Breath sounds: No wheezing or rales.  Chest:     Chest wall: No tenderness.  Abdominal:     General: Bowel sounds are normal.     Palpations: Abdomen is soft.  Musculoskeletal:        General: Normal range of motion.     Cervical back: Normal range of motion and neck supple.  Lymphadenopathy:     Cervical: No cervical adenopathy.  Skin:    General: Skin is warm and dry.  Neurological:     Mental Status: She is alert and oriented to person, place, and time.     Cranial Nerves: No cranial nerve deficit.  Psychiatric:        Behavior: Behavior normal.        Thought Content: Thought content normal.        Judgment: Judgment normal.       Assessment/Plan: 1. Essential hypertension Well-controlled continue current medications.  Patient states she is also due for cardiology follow-up in December  2. OSA (obstructive sleep apnea) Sleep study showed mild OSA overall however moderate  and REM.  APAP 5 to 15 cm of H2O will be ordered.  Also discussed some abnormal cardiac findings throughout sleep study for which patient will discuss further with cardiology - For home use only DME continuous positive airway pressure (CPAP)  3. Mild intermittent asthma without complication Stable, continue current medications as prescribed and as indicated - albuterol (VENTOLIN HFA) 108 (90 Base) MCG/ACT inhaler; Inhale 2 puffs into the lungs daily.  Dispense: 18 g; Refill: 5 - Budesonide (PULMICORT FLEXHALER) 90 MCG/ACT inhaler; Inhale 2 puffs into the lungs 2 (two) times daily.  Dispense: 3 each; Refill: 1  4. Inflammatory polyarthritis (HCC) - diclofenac Sodium (VOLTAREN) 1 % GEL; Apply 2 g topically 4 (four) times daily.  Dispense: 350 g; Refill: 2  5. Neuralgia - gabapentin (NEURONTIN) 300 MG capsule; Take 1 capsule (300 mg total) by mouth 3 (three) times daily.  Dispense: 270 capsule; Refill: 0  6. Allergic rhinitis, unspecified seasonality, unspecified trigger - fluticasone (FLONASE) 50 MCG/ACT nasal spray; Place 1 spray into both nostrils at bedtime.  Dispense: 48 g; Refill: 3  7. Encounter for surveillance of contraceptive pills - Norethindrone Acetate-Ethinyl Estradiol (AUROVELA 1.5/30) 1.5-30 MG-MCG tablet; Take 1 tablet by mouth daily.  Dispense: 28 tablet; Refill: 4   General Counseling: Manvi verbalizes understanding of the findings of todays visit and agrees with plan of treatment. I have discussed any further diagnostic evaluation that may be needed or ordered today. We also reviewed her medications today. she has been encouraged to call the office with any questions or concerns that should arise related to todays visit.    Orders Placed This Encounter  Procedures   For home use only DME continuous positive airway pressure (CPAP)    Meds ordered this encounter  Medications   albuterol (VENTOLIN HFA) 108 (90 Base) MCG/ACT inhaler    Sig: Inhale 2 puffs into the lungs  daily.    Dispense:  18 g    Refill:  5   Budesonide (PULMICORT FLEXHALER) 90 MCG/ACT inhaler    Sig: Inhale 2 puffs into the lungs 2 (two) times daily.    Dispense:  3 each    Refill:  1   diclofenac Sodium (VOLTAREN) 1 % GEL    Sig: Apply 2 g topically 4 (four) times daily.    Dispense:  350 g    Refill:  2   fluticasone (FLONASE) 50 MCG/ACT nasal spray    Sig: Place 1 spray into both nostrils at bedtime.    Dispense:  48 g    Refill:  3   gabapentin (NEURONTIN) 300 MG capsule    Sig: Take 1 capsule (300 mg total) by mouth 3 (three) times daily.    Dispense:  270 capsule    Refill:  0   Norethindrone Acetate-Ethinyl Estradiol (AUROVELA 1.5/30) 1.5-30 MG-MCG tablet    Sig: Take 1 tablet by mouth daily.    Dispense:  28 tablet    Refill:  4    This patient was seen by Drema Dallas, PA-C in collaboration with Dr. Clayborn Bigness as a part of collaborative care agreement.   Total time spent:30 Minutes Time spent includes review of chart, medications, test results, and follow up plan with the patient.      Dr Lavera Guise Internal medicine

## 2021-08-28 ENCOUNTER — Other Ambulatory Visit: Payer: Self-pay | Admitting: Physician Assistant

## 2021-08-28 DIAGNOSIS — M792 Neuralgia and neuritis, unspecified: Secondary | ICD-10-CM

## 2021-08-29 ENCOUNTER — Ambulatory Visit
Admission: RE | Admit: 2021-08-29 | Discharge: 2021-08-29 | Disposition: A | Discharge status: Home / Self Care | Payer: Medicare Other | Source: Ambulatory Visit | Attending: Physician Assistant | Admitting: Physician Assistant

## 2021-08-29 ENCOUNTER — Telehealth (INDEPENDENT_AMBULATORY_CARE_PROVIDER_SITE_OTHER): Payer: Medicare Other | Admitting: Nurse Practitioner

## 2021-08-29 ENCOUNTER — Other Ambulatory Visit: Payer: Self-pay

## 2021-08-29 VITALS — Temp 97.5°F | Ht 66.0 in | Wt 240.0 lb

## 2021-08-29 DIAGNOSIS — N6489 Other specified disorders of breast: Secondary | ICD-10-CM | POA: Diagnosis not present

## 2021-08-29 DIAGNOSIS — R2231 Localized swelling, mass and lump, right upper limb: Secondary | ICD-10-CM | POA: Diagnosis not present

## 2021-08-29 DIAGNOSIS — J0181 Other acute recurrent sinusitis: Secondary | ICD-10-CM

## 2021-08-29 DIAGNOSIS — J0191 Acute recurrent sinusitis, unspecified: Secondary | ICD-10-CM

## 2021-08-29 DIAGNOSIS — R051 Acute cough: Secondary | ICD-10-CM | POA: Diagnosis not present

## 2021-08-29 DIAGNOSIS — R928 Other abnormal and inconclusive findings on diagnostic imaging of breast: Secondary | ICD-10-CM | POA: Diagnosis not present

## 2021-08-29 MED ORDER — DOXYCYCLINE HYCLATE 100 MG PO TABS: 100.0000 mg | ORAL_TABLET | Freq: Two times a day (BID) | ORAL | 0 refills | Status: DC

## 2021-08-29 NOTE — Progress Notes (Signed)
[facility] [place], [place] [address]  Internal MEDICINE  Telephone Visit  Patient Name: Morgan Kent  [id number]  [id number]  Date of Service: 08/29/2021  I connected with the patient at 10:20 AM by telephone and verified the patients identity using two identifiers.   I discussed the limitations, risks, security and privacy concerns of performing an evaluation and management service by telephone and the availability of in person appointments. I also discussed with the patient that there may be a patient responsible charge related to the service.  The patient expressed understanding and agrees to proceed.    Chief Complaint  Patient presents with   Telephone Screen    [id number]   Telephone Assessment   Cough   Sinusitis    HPI Morgan Kent presents for a telehealth virtual visit for symptoms of sinusitis. She reports cough, headache, nasal congestion. She has not received the covid vaccine and has not had testing for covid done recently. She was encouraged by staff to have PCR testing done.    Current Medication: Outpatient Encounter Medications as of 08/29/2021  Medication Sig   doxycycline (VIBRA-TABS) 100 MG tablet Take 1 tablet (100 mg total) by mouth 2 (two) times daily. With food   albuterol (VENTOLIN HFA) 108 (90 Base) MCG/ACT inhaler Inhale 2 puffs into the lungs daily.   amLODipine (NORVASC) 5 MG tablet Take 1 tablet (5 mg total) by mouth daily.   AUROVELA FE 1.5/30 1.5-30 MG-MCG tablet TAKE 1 TABLET BY MOUTH AT BEDTIME   Budesonide (PULMICORT FLEXHALER) 90 MCG/ACT inhaler Inhale 2 puffs into the lungs 2 (two) times daily.   carvedilol (COREG) 25 MG tablet TAKE 1 TABLET(25 MG) BY MOUTH TWICE DAILY   cetirizine (ZYRTEC) 10 MG tablet Take 1 tablet (10 mg total) by mouth daily.   clobetasol (TEMOVATE) 0.05 % external solution Apply 1 application topically 2 (two) times daily as needed. Apply to scalp   cyclobenzaprine (FLEXERIL) 10 MG tablet Take 1  tablet (10 mg total) by mouth 3 (three) times daily as needed.   diclofenac Sodium (VOLTAREN) 1 % GEL Apply 2 g topically 4 (four) times daily.   ferrous sulfate (FERROUSUL) 325 (65 FE) MG tablet Take 1 tablet (325 mg total) by mouth daily with breakfast.   fluticasone (FLONASE) 50 MCG/ACT nasal spray Place 1 spray into both nostrils at bedtime.   gabapentin (NEURONTIN) 300 MG capsule TAKE 1 CAPSULE(300 MG) BY MOUTH THREE TIMES DAILY   hydrochlorothiazide (HYDRODIURIL) 12.5 MG tablet TAKE 1 TABLET(12.5 MG) BY MOUTH DAILY   hydroxychloroquine (PLAQUENIL) 200 MG tablet Take 200 mg by mouth daily.    ketoconazole (NIZORAL) 2 % shampoo APPLY TOPICALLY 1 TIME FOR 1 DOSE   ketoconazole (NIZORAL) 2 % shampoo APPLY TOPICALLY TO THE AFFECTED AREA EVERY 3 DAYS, LET SIT 5 MINUTES BEFORE RINSING OUT   lisinopril (ZESTRIL) 5 MG tablet TAKE 1 TABLET(5 MG) BY MOUTH DAILY   meloxicam (MOBIC) 7.5 MG tablet Take 1 tablet (7.5 mg total) by mouth 2 (two) times daily.   mometasone (ELOCON) 0.1 % lotion APPLY TO LUPUS IN SCALP DAILY AS NEEDED FOR FLARES   mometasone (ELOCON) 0.1 % ointment APPLY TO LUPUS SORES ON FACE TOPICALLY DAILY AS NEEDED FOR FLARES   montelukast (SINGULAIR) 10 MG tablet Take 1 tablet (10 mg total) by mouth at bedtime.   mupirocin ointment (BACTROBAN) 2 % Apply to open area on skin qd-bid   NARCAN 4 MG/0.1ML LIQD nasal spray kit SMARTSIG:1 Spray(s) Both Nares  Once PRN   Norethindrone Acetate-Ethinyl Estradiol (AUROVELA 1.5/30) 1.5-30 MG-MCG tablet Take 1 tablet by mouth daily.   oxyCODONE-acetaminophen (PERCOCET) 7.5-325 MG tablet Take 1 tablet by mouth every 4 (four) hours as needed for severe pain.   Polyethyl Glycol-Propyl Glycol (SYSTANE ULTRA) 0.4-0.3 % SOLN Place 1 drop into both eyes daily as needed (for dry eyes).    tacrolimus (PROTOPIC) 0.1 % ointment APPLY TOPICALLY TO THE AFFECTED AREA DAILY   No facility-administered encounter medications on file as of 08/29/2021.    Surgical  History: Past Surgical History:  Procedure Laterality Date   carpel tunnel  Left 2014   CRANIOTOMY  14,06   chiari   DENTAL SURGERY     HERNIA REPAIR     umbilical   PLACEMENT OF LUMBAR DRAIN N/A 07/07/2014   Procedure: PLACEMENT OF LUMBAR DRAIN AND CLOSURE OF CERVICAL INCISION;  Surgeon: Newman Pies, MD;  Location: [place] [facility];  Service: Neurosurgery;  Laterality: N/A;   SUBOCCIPITAL CRANIECTOMY CERVICAL LAMINECTOMY N/A 09/29/2013   Procedure: SUBOCCIPITAL CRANIECTOMY CERVICAL LAMINECTOMY/DURAPLASTY;  Surgeon: Ophelia Charter, MD;  Location: [place] [facility];  Service: Neurosurgery;  Laterality: N/A;  posterior   SUBOCCIPITAL CRANIECTOMY CERVICAL LAMINECTOMY N/A 06/23/2014   Procedure: SUBOCCIPITAL CRANIECTOMY CERVICAL LAMINECTOMY/DURAPLASTY;  Surgeon: Newman Pies, MD;  Location: [place] [facility];  Service: Neurosurgery;  Laterality: N/A;  suboccipital craniectomy with cervical laminectomy and duraplasty    Medical History: Past Medical History:  Diagnosis Date   Anxiety    Arthritis    rheumo possible   Chiari malformation    Depression    Dizziness    Headache(784.0)    History of kidney stones    Hypertension    Lupus (HCC)    Lupus (HCC)    Nerve damage    NECK   PONV (postoperative nausea and vomiting)    nausea only   Sleep apnea    TMJ arthritis     Family History: Family History  Problem Relation Age of Onset   Heart disease Father    Hypertension Father    Hypertension Mother    Thyroid disease Mother    Asthma Other    Depression Other    Diabetes Other    Migraines Other    Stroke Other     Social History   Socioeconomic History   Marital status: Single    Spouse name: Not on file   Number of children: Not on file   Years of education: Not on file   Highest education level: Not on file  Occupational History   Not on file  Tobacco Use   Smoking status: Never   Smokeless tobacco: Never  Vaping Use   Vaping Use: Never used  Substance and  Sexual Activity   Alcohol use: No    Alcohol/week: 0.0 standard drinks   Drug use: No   Sexual activity: Not on file  Other Topics Concern   Not on file  Social History Narrative   Lives with 3 children in 2 story home.  Unemployed.  Trying to get disability.  Education: high school.   Social Determinants of Health   Financial Resource Strain: Not on file  Food Insecurity: Not on file  Transportation Needs: Not on file  Physical Activity: Not on file  Stress: Not on file  Social Connections: Not on file  Intimate Partner Violence: Not on file      Review of Systems  Constitutional:  Positive for fever. Negative for chills and fatigue.  HENT:  Positive for congestion, postnasal drip, rhinorrhea, sinus pressure, sinus pain, sneezing and sore throat. Negative for ear pain.   Eyes:  Positive for pain and discharge.  Respiratory:  Positive for cough. Negative for chest tightness, shortness of breath and wheezing.   Cardiovascular: Negative.  Negative for chest pain and palpitations.  Gastrointestinal: Negative.  Negative for abdominal pain, constipation, diarrhea, nausea and vomiting.  Musculoskeletal:  Negative for myalgias.  Neurological:  Positive for headaches. Negative for dizziness and light-headedness.   Vital Signs: Temp (!) 97.5 F (36.4 C)   Ht _0  (1.676 m)   Wt 240 lb (108.9 kg)   BMI 38.74 kg/m    Observation/Objective: She is alert and oriented, and engages in conversation appropriately. Shedoes not sound like she is in any acute distress over telephone call.    Assessment/Plan: 1. Other acute recurrent sinusitis Empiric antibiotic treatment of sinusitis prescribed, she was treated on 9/28 for sinusitis which never completely resolved as of today. She was prescribed augmentin that time so doxycycline was prescribed today.  - doxycycline (VIBRA-TABS) 100 MG tablet; Take 1 tablet (100 mg total) by mouth 2 (two) times daily. With food  Dispense: 20 tablet;  Refill: 0  2. Acute cough Symptomatic relief with OTC medication if the patient desires. If she still has any benzonatate left, she can also take that for the cough.     General Counseling: Morgan Kent verbalizes understanding of the findings of today's phone visit and agrees with plan of treatment. I have discussed any further diagnostic evaluation that may be needed or ordered today. We also reviewed her medications today. she has been encouraged to call the office with any questions or concerns that should arise related to todays visit.  Return if symptoms worsen or fail to improve.   No orders of the defined types were placed in this encounter.   Meds ordered this encounter  Medications   doxycycline (VIBRA-TABS) 100 MG tablet    Sig: Take 1 tablet (100 mg total) by mouth 2 (two) times daily. With food    Dispense:  20 tablet    Refill:  0    Time spent:10 Minutes Time spent with patient included reviewing progress notes, labs, imaging studies, and discussing plan for follow up.  [place] Controlled Substance Database was reviewed by me for overdose risk score (ORS) if appropriate.  This patient was seen by Morgan Osgood, FNP-C in collaboration with Dr. Clayborn Bigness as a part of collaborative care agreement.  Morgan Bacha R. Valetta Fuller, MSN, FNP-C Internal medicine

## 2021-08-31 ENCOUNTER — Other Ambulatory Visit: Payer: Self-pay | Admitting: Nurse Practitioner

## 2021-08-31 DIAGNOSIS — R059 Cough, unspecified: Secondary | ICD-10-CM

## 2021-08-31 DIAGNOSIS — J0191 Acute recurrent sinusitis, unspecified: Secondary | ICD-10-CM

## 2021-09-07 ENCOUNTER — Other Ambulatory Visit: Payer: Self-pay | Admitting: Physician Assistant

## 2021-09-07 DIAGNOSIS — Z79891 Long term (current) use of opiate analgesic: Secondary | ICD-10-CM | POA: Diagnosis not present

## 2021-09-07 DIAGNOSIS — G894 Chronic pain syndrome: Secondary | ICD-10-CM | POA: Diagnosis not present

## 2021-09-09 ENCOUNTER — Other Ambulatory Visit: Payer: Self-pay | Admitting: Cardiovascular Disease

## 2021-09-09 ENCOUNTER — Other Ambulatory Visit: Payer: Self-pay | Admitting: Dermatology

## 2021-09-09 DIAGNOSIS — L930 Discoid lupus erythematosus: Secondary | ICD-10-CM

## 2021-09-09 NOTE — Progress Notes (Signed)
Cardiology Office Note  Date:  09/10/2021   ID:  Morgan, Kent 02/07/1981, MRN [id number]  PCP:  Lavera Guise, MD   Chief Complaint  Patient presents with   Follow-up    "Doing well." Patient c/o recently being diagnosed with sleep apnea. Medications reviewed by the patient verbally.    HPI:  [name] is a pleasant 40 year old woman with  morbid obesity, lupus, dx in 2013 skin and fingertips affected neck surgery With chronic pain down her arms bilaterally,  sleep apnea on nasal CPAP,   EF 60 to 65% in 11/2015, up from 45% in 2016 who presents for follow-up of her symptoms of shortness of breath, cardiomyopathy  Lov 10/2020 [place] with OSA, Does not have CPAP  Otherwise feels well,   No lupus flares Followed by rheumatology, Dr. Jefm Bryant,  Bad year for the skin, fingertips tingling, sometimes blue  For back/spine,  neuropathy, followed by Dr. Arnoldo Morale Had prior surgery  Followed by pain clinic in [facility] for pain in her neck, arms, legs, hurts to walk  In the hospital January 2021 for iron deficiency anemia  meds reviewed On coreg, lisinopril, HCTZ  Chronic SOB on exertion,  no regular exercise Pain with walking, back/shoulders  Lab Results  Component Value Date   CHOL 162 02/26/2021   HDL 37 (L) 02/26/2021   LDLCALC 85 02/26/2021   TRIG 242 (H) 02/26/2021    EKG personally reviewed by myself on todays visit Shows normal sinus rhythm rate 93 bpm no significant ST or T wave changes  PMH:   has a past medical history of Anxiety, Arthritis, Chiari malformation, Depression, Dizziness, Headache(784.0), History of kidney stones, Hypertension, Lupus ([place]), Lupus ([place]), Nerve damage, PONV (postoperative nausea and vomiting), Sleep apnea, and TMJ arthritis.  PSH:    Past Surgical History:  Procedure Laterality Date   carpel tunnel  Left 2014   CRANIOTOMY  14,06   chiari   DENTAL SURGERY     HERNIA REPAIR     umbilical   PLACEMENT OF LUMBAR DRAIN N/A  07/07/2014   Procedure: PLACEMENT OF LUMBAR DRAIN AND CLOSURE OF CERVICAL INCISION;  Surgeon: Newman Pies, MD;  Location: [place] [facility];  Service: Neurosurgery;  Laterality: N/A;   SUBOCCIPITAL CRANIECTOMY CERVICAL LAMINECTOMY N/A 09/29/2013   Procedure: SUBOCCIPITAL CRANIECTOMY CERVICAL LAMINECTOMY/DURAPLASTY;  Surgeon: Ophelia Charter, MD;  Location: [place] [facility];  Service: Neurosurgery;  Laterality: N/A;  posterior   SUBOCCIPITAL CRANIECTOMY CERVICAL LAMINECTOMY N/A 06/23/2014   Procedure: SUBOCCIPITAL CRANIECTOMY CERVICAL LAMINECTOMY/DURAPLASTY;  Surgeon: Newman Pies, MD;  Location: [place] [facility];  Service: Neurosurgery;  Laterality: N/A;  suboccipital craniectomy with cervical laminectomy and duraplasty    Current Outpatient Medications  Medication Sig Dispense Refill   albuterol (VENTOLIN HFA) 108 (90 Base) MCG/ACT inhaler Inhale 2 puffs into the lungs daily. 18 g 5   amLODipine (NORVASC) 5 MG tablet TAKE 1 TABLET(5 MG) BY MOUTH DAILY 90 tablet 0   AUROVELA FE 1.5/30 1.5-30 MG-MCG tablet TAKE 1 TABLET BY MOUTH AT BEDTIME 28 tablet 1   Budesonide (PULMICORT FLEXHALER) 90 MCG/ACT inhaler Inhale 2 puffs into the lungs 2 (two) times daily. 3 each 1   carvedilol (COREG) 25 MG tablet TAKE 1 TABLET(25 MG) BY MOUTH TWICE DAILY 180 tablet 0   cetirizine (ZYRTEC) 10 MG tablet Take 1 tablet (10 mg total) by mouth daily. 30 tablet 5   clobetasol (TEMOVATE) 0.05 % external solution Apply 1 application topically 2 (two) times daily as needed. Apply to  scalp     cyclobenzaprine (FLEXERIL) 10 MG tablet Take 1 tablet (10 mg total) by mouth 3 (three) times daily as needed. 30 tablet 1   diclofenac Sodium (VOLTAREN) 1 % GEL Apply 2 g topically 4 (four) times daily. 350 g 2   ferrous sulfate (FERROUSUL) 325 (65 FE) MG tablet Take 1 tablet (325 mg total) by mouth daily with breakfast. 90 tablet 1   fluticasone (FLONASE) 50 MCG/ACT nasal spray Place 1 spray into both nostrils at bedtime. 48 g 3    gabapentin (NEURONTIN) 300 MG capsule TAKE 1 CAPSULE(300 MG) BY MOUTH THREE TIMES DAILY 270 capsule 0   hydrochlorothiazide (HYDRODIURIL) 12.5 MG tablet TAKE 1 TABLET(12.5 MG) BY MOUTH DAILY 90 tablet 0   hydroxychloroquine (PLAQUENIL) 200 MG tablet Take 200 mg by mouth daily.      ketoconazole (NIZORAL) 2 % shampoo APPLY TOPICALLY TO THE AFFECTED AREA EVERY 3 DAYS, LET SIT 5 MINUTES BEFORE RINSING OUT 120 mL 11   lisinopril (ZESTRIL) 5 MG tablet TAKE 1 TABLET(5 MG) BY MOUTH DAILY 90 tablet 0   meloxicam (MOBIC) 7.5 MG tablet Take 1 tablet (7.5 mg total) by mouth 2 (two) times daily. 180 tablet 1   mometasone (ELOCON) 0.1 % ointment APPLY TO LUPUS SORES ON FACE TOPICALLY DAILY AS NEEDED FOR FLARES 45 g 6   montelukast (SINGULAIR) 10 MG tablet Take 1 tablet (10 mg total) by mouth at bedtime. 90 tablet 3   mupirocin ointment (BACTROBAN) 2 % Apply to open area on skin qd-bid 22 g 5   NARCAN 4 MG/0.1ML LIQD nasal spray kit SMARTSIG:1 Spray(s) Both Nares Once PRN     Norethindrone Acetate-Ethinyl Estradiol (AUROVELA 1.5/30) 1.5-30 MG-MCG tablet Take 1 tablet by mouth daily. 28 tablet 4   oxyCODONE-acetaminophen (PERCOCET) 7.5-325 MG tablet Take 1 tablet by mouth every 4 (four) hours as needed for severe pain.     Polyethyl Glycol-Propyl Glycol (SYSTANE ULTRA) 0.4-0.3 % SOLN Place 1 drop into both eyes daily as needed (for dry eyes).      tacrolimus (PROTOPIC) 0.1 % ointment APPLY TOPICALLY TO THE AFFECTED AREA DAILY 100 g 3   doxycycline (VIBRA-TABS) 100 MG tablet Take 1 tablet (100 mg total) by mouth 2 (two) times daily. With food (Patient not taking: Reported on 09/10/2021) 20 tablet 0   ketoconazole (NIZORAL) 2 % shampoo APPLY TOPICALLY 1 TIME FOR 1 DOSE (Patient not taking: Reported on 09/10/2021) 120 mL 11   mometasone (ELOCON) 0.1 % lotion APPLY TO LUPUS IN SCALP DAILY AS NEEDED FOR FLARES (Patient not taking: Reported on 09/10/2021) 60 mL 3   No current facility-administered medications for this  visit.    Allergies:   Other, Sulfa antibiotics, and Cephalexin   Social History:  The patient  reports that she has never smoked. She has never used smokeless tobacco. She reports that she does not drink alcohol and does not use drugs.   Family History:   family history includes Asthma in an other family member; Depression in an other family member; Diabetes in an other family member; Heart disease in her father; Hypertension in her father and mother; Migraines in an other family member; Stroke in an other family member; Thyroid disease in her mother.    Review of Systems: Review of Systems  Constitutional: Negative.   HENT: Negative.    Respiratory: Negative.    Cardiovascular: Negative.   Gastrointestinal: Negative.   Musculoskeletal: Negative.        Chronic arm, neck, leg  pain, finger tips sore  Neurological: Negative.   Psychiatric/Behavioral: Negative.    All other systems reviewed and are negative.  PHYSICAL EXAM: VS:  BP 120/78 (BP Location: Left Arm, Patient Position: Sitting, Cuff Size: Normal)   Pulse 93   Ht 5' 6"  (1.676 m)   Wt 237 lb 6 oz (107.7 kg)   SpO2 93%   BMI 38.31 kg/m  , BMI Body mass index is 38.31 kg/m.  Constitutional:  oriented to person, place, and time. No distress.  HENT:  Head: Grossly normal Eyes:  no discharge. No scleral icterus.  Neck: No JVD, no carotid bruits  Cardiovascular: Regular rate and rhythm, no murmurs appreciated Pulmonary/Chest: Clear to auscultation bilaterally, no wheezes or rails Abdominal: Soft.  no distension.  no tenderness.  Musculoskeletal: Normal range of motion Neurological:  normal muscle tone. Coordination normal. No atrophy Skin: Skin warm and dry Psychiatric: normal affect, pleasant   Recent Labs: 02/26/2021: ALT 9; BUN 10; Creatinine, Ser 0.91; Hemoglobin 11.1; Platelets 293; Potassium 4.8; Sodium 136; TSH 0.681    Lipid Panel Lab Results  Component Value Date   CHOL 162 02/26/2021   HDL 37 (L)  02/26/2021   LDLCALC 85 02/26/2021   TRIG 242 (H) 02/26/2021      Wt Readings from Last 3 Encounters:  09/10/21 237 lb 6 oz (107.7 kg)  08/29/21 240 lb (108.9 kg)  08/23/21 240 lb (108.9 kg)      ASSESSMENT AND PLAN:  obesity  She reports unable to exercise secondary to chronic pain We have encouraged careful diet management in an effort to lose weight. Walking with a cane  Essential hypertension Blood pressure is well controlled on today's visit. No changes made to the medications.  Stable  Obstructive sleep apnea on CPAP Diagnoses with sleep apnea per PMD Does not have CPAP yet  Shortness of breath Deconditioned, weight is elevated Activity limited by pain  Tachycardia Stable on beta-blocker, no changes to her medications Continue coreg 25 BID  Chronic pain, arms Chronic neck, shoulder, back, arm pain  on Neurontin,   followed by Dr. Arnoldo Morale Prior surgery   Total encounter time more than 25 minutes  Greater than 50% was spent in counseling and coordination of care with the patient   No orders of the defined types were placed in this encounter.    Signed, Esmond Plants, M.D., Ph.D. 09/10/2021  [place], [place]

## 2021-09-10 ENCOUNTER — Other Ambulatory Visit: Payer: Self-pay

## 2021-09-10 ENCOUNTER — Encounter: Payer: Self-pay | Admitting: Cardiovascular Disease

## 2021-09-10 ENCOUNTER — Ambulatory Visit (INDEPENDENT_AMBULATORY_CARE_PROVIDER_SITE_OTHER): Payer: Medicare Other | Admitting: Cardiovascular Disease

## 2021-09-10 VITALS — BP 120/78 | HR 93 | Ht 66.0 in | Wt 237.4 lb

## 2021-09-10 DIAGNOSIS — L930 Discoid lupus erythematosus: Secondary | ICD-10-CM

## 2021-09-10 DIAGNOSIS — I5022 Chronic systolic (congestive) heart failure: Secondary | ICD-10-CM

## 2021-09-10 DIAGNOSIS — G4733 Obstructive sleep apnea (adult) (pediatric): Secondary | ICD-10-CM

## 2021-09-10 DIAGNOSIS — I420 Dilated cardiomyopathy: Secondary | ICD-10-CM

## 2021-09-10 MED ORDER — AMLODIPINE BESYLATE 5 MG PO TABS: ORAL_TABLET | ORAL | 3 refills | Status: DC

## 2021-09-10 MED ORDER — HYDROCHLOROTHIAZIDE 12.5 MG PO TABS: ORAL_TABLET | ORAL | 3 refills | Status: DC

## 2021-09-10 MED ORDER — NARCAN 4 MG/0.1ML NA LIQD: NASAL | 1 refills | Status: DC

## 2021-09-10 MED ORDER — LISINOPRIL 5 MG PO TABS: ORAL_TABLET | ORAL | 3 refills | Status: DC

## 2021-09-10 MED ORDER — CARVEDILOL 25 MG PO TABS: ORAL_TABLET | ORAL | 3 refills | Status: DC

## 2021-09-10 NOTE — Patient Instructions (Addendum)
Medication Instructions:  No changes  If you need a refill on your cardiac medications before your next appointment, please call your pharmacy.   Lab work: No new labs needed  Testing/Procedures: No new testing needed  Follow-Up: At [facility], you and your health needs are our priority.  As part of our continuing mission to provide you with exceptional heart care, we have created designated Provider Care Teams.  These Care Teams include your primary Cardiologist (physician) and Advanced Practice Providers (APPs -  Physician Assistants and Nurse Practitioners) who all work together to provide you with the care you need, when you need it.  You will need a follow up appointment in 12 months  Providers on your designated Care Team:   Christopher Berge, NP Ryan Dunn, PA-C Cadence Furth, PA-C  COVID-19 Vaccine Information can be found at: [email or web address] For questions related to vaccine distribution or appointments, please email [email or web address] or call [phone].   

## 2021-10-08 DIAGNOSIS — G894 Chronic pain syndrome: Secondary | ICD-10-CM | POA: Diagnosis not present

## 2021-10-08 DIAGNOSIS — Z79891 Long term (current) use of opiate analgesic: Secondary | ICD-10-CM | POA: Diagnosis not present

## 2021-10-16 DIAGNOSIS — M542 Cervicalgia: Secondary | ICD-10-CM | POA: Diagnosis not present

## 2021-11-06 DIAGNOSIS — Z79891 Long term (current) use of opiate analgesic: Secondary | ICD-10-CM | POA: Diagnosis not present

## 2021-11-06 DIAGNOSIS — G894 Chronic pain syndrome: Secondary | ICD-10-CM | POA: Diagnosis not present

## 2021-11-20 ENCOUNTER — Other Ambulatory Visit: Payer: Self-pay | Admitting: Physician Assistant

## 2021-11-20 DIAGNOSIS — J4520 Mild intermittent asthma, uncomplicated: Secondary | ICD-10-CM

## 2021-11-22 ENCOUNTER — Other Ambulatory Visit: Payer: Self-pay

## 2021-11-22 ENCOUNTER — Encounter: Payer: Self-pay | Admitting: Physician Assistant

## 2021-11-22 ENCOUNTER — Ambulatory Visit (INDEPENDENT_AMBULATORY_CARE_PROVIDER_SITE_OTHER): Payer: Medicare Other | Admitting: Physician Assistant

## 2021-11-22 DIAGNOSIS — G4733 Obstructive sleep apnea (adult) (pediatric): Secondary | ICD-10-CM

## 2021-11-22 DIAGNOSIS — M792 Neuralgia and neuritis, unspecified: Secondary | ICD-10-CM | POA: Diagnosis not present

## 2021-11-22 DIAGNOSIS — M329 Systemic lupus erythematosus, unspecified: Secondary | ICD-10-CM

## 2021-11-22 DIAGNOSIS — E538 Deficiency of other specified B group vitamins: Secondary | ICD-10-CM | POA: Diagnosis not present

## 2021-11-22 DIAGNOSIS — I10 Essential (primary) hypertension: Secondary | ICD-10-CM

## 2021-11-22 DIAGNOSIS — I7300 Raynaud's syndrome without gangrene: Secondary | ICD-10-CM

## 2021-11-22 MED ORDER — GABAPENTIN 300 MG PO CAPS: ORAL_CAPSULE | ORAL | 0 refills | Status: DC

## 2021-11-22 MED ORDER — CYANOCOBALAMIN 1000 MCG/ML IJ SOLN
1000.0000 ug | Freq: Once | INTRAMUSCULAR | Status: AC
  Administered 2021-11-22: 1000 ug via INTRAMUSCULAR

## 2021-11-22 NOTE — Progress Notes (Signed)
[facility] [place], [place] [address]  Internal MEDICINE  Office Visit Note  Patient Name: Morgan Kent  [id number]  [id number]  Date of Service: 11/26/2021  Chief Complaint  Patient presents with   Follow-up   Hypertension   Depression   Hand Burn    Both hands have been hurting and feel numb at times    HPI Pt is here for routine follow up -She states that her rheumatologist who follows her Lupus at [facility] is also following her for numbness in hands, blue tips and coldness. Was given a shot in her hand at one point and it helped some but then wore off. Following her symptoms which are likely due to raynauds vs carpal tunnel symptoms. -Stopped b12 shots, unsure why--thinks she missed one when sick and never rescheduled. Will restart since was very low last check and worsening symptoms. -Received her cpap and has paper stating she needs a compliance download between dec 26-feb 21. Reports she forgets to put it on some nights. Advised by Claiborne Billings to try practicing when sitting in evening. Discussed the importance of wearing nightly and will set up a visit with Claiborne Billings for a download. She is benefiting from use. -BP well controlled  Current Medication: Outpatient Encounter Medications as of 11/22/2021  Medication Sig   albuterol (VENTOLIN HFA) 108 (90 Base) MCG/ACT inhaler Inhale 2 puffs into the lungs daily.   amLODipine (NORVASC) 5 MG tablet TAKE 1 TABLET(5 MG) BY MOUTH DAILY   AUROVELA FE 1.5/30 1.5-30 MG-MCG tablet TAKE 1 TABLET BY MOUTH AT BEDTIME   carvedilol (COREG) 25 MG tablet TAKE 1 TABLET(25 MG) BY MOUTH TWICE DAILY   cetirizine (ZYRTEC) 10 MG tablet Take 1 tablet (10 mg total) by mouth daily.   clobetasol (TEMOVATE) 0.05 % external solution Apply 1 application topically 2 (two) times daily as needed. Apply to scalp   cyclobenzaprine (FLEXERIL) 10 MG tablet Take 1 tablet (10 mg total) by mouth 3 (three) times daily as needed.   diclofenac Sodium  (VOLTAREN) 1 % GEL Apply 2 g topically 4 (four) times daily.   doxycycline (VIBRA-TABS) 100 MG tablet Take 1 tablet (100 mg total) by mouth 2 (two) times daily. With food   ferrous sulfate (FERROUSUL) 325 (65 FE) MG tablet Take 1 tablet (325 mg total) by mouth daily with breakfast.   fluticasone (FLONASE) 50 MCG/ACT nasal spray Place 1 spray into both nostrils at bedtime.   hydrochlorothiazide (HYDRODIURIL) 12.5 MG tablet TAKE 1 TABLET(12.5 MG) BY MOUTH DAILY   hydroxychloroquine (PLAQUENIL) 200 MG tablet Take 200 mg by mouth daily.    ketoconazole (NIZORAL) 2 % shampoo APPLY TOPICALLY 1 TIME FOR 1 DOSE   ketoconazole (NIZORAL) 2 % shampoo APPLY TOPICALLY TO THE AFFECTED AREA EVERY 3 DAYS, LET SIT 5 MINUTES BEFORE RINSING OUT   lisinopril (ZESTRIL) 5 MG tablet TAKE 1 TABLET(5 MG) BY MOUTH DAILY   meloxicam (MOBIC) 7.5 MG tablet Take 1 tablet (7.5 mg total) by mouth 2 (two) times daily.   mometasone (ELOCON) 0.1 % lotion APPLY TOPICALLY TO THE SCALP DAILY AS NEEDED FOR LUPUS OR FLARES   mometasone (ELOCON) 0.1 % ointment APPLY TO LUPUS SORES ON FACE TOPICALLY DAILY AS NEEDED FOR FLARES   montelukast (SINGULAIR) 10 MG tablet Take 1 tablet (10 mg total) by mouth at bedtime.   mupirocin ointment (BACTROBAN) 2 % Apply to open area on skin qd-bid   Norethindrone Acetate-Ethinyl Estradiol (AUROVELA 1.5/30) 1.5-30 MG-MCG tablet Take 1 tablet  by mouth daily.   oxyCODONE-acetaminophen (PERCOCET) 7.5-325 MG tablet Take 1 tablet by mouth every 4 (four) hours as needed for severe pain.   Polyethyl Glycol-Propyl Glycol (SYSTANE ULTRA) 0.4-0.3 % SOLN Place 1 drop into both eyes daily as needed (for dry eyes).    PULMICORT FLEXHALER 90 MCG/ACT inhaler INHALE 2 PUFFS INTO THE LUNGS TWICE DAILY   tacrolimus (PROTOPIC) 0.1 % ointment APPLY TOPICALLY TO THE AFFECTED AREA DAILY   [DISCONTINUED] gabapentin (NEURONTIN) 300 MG capsule TAKE 1 CAPSULE(300 MG) BY MOUTH THREE TIMES DAILY   gabapentin (NEURONTIN) 300 MG  capsule TAKE 1 CAPSULE(300 MG) BY MOUTH THREE TIMES DAILY   [EXPIRED] cyanocobalamin ((VITAMIN B-12)) injection 1,000 mcg    No facility-administered encounter medications on file as of 11/22/2021.    Surgical History: Past Surgical History:  Procedure Laterality Date   carpel tunnel  Left 2014   CRANIOTOMY  14,06   chiari   DENTAL SURGERY     HERNIA REPAIR     umbilical   PLACEMENT OF LUMBAR DRAIN N/A 07/07/2014   Procedure: PLACEMENT OF LUMBAR DRAIN AND CLOSURE OF CERVICAL INCISION;  Surgeon: Newman Pies, MD;  Location: [place] [facility];  Service: Neurosurgery;  Laterality: N/A;   SUBOCCIPITAL CRANIECTOMY CERVICAL LAMINECTOMY N/A 09/29/2013   Procedure: SUBOCCIPITAL CRANIECTOMY CERVICAL LAMINECTOMY/DURAPLASTY;  Surgeon: Ophelia Charter, MD;  Location: [place] [facility];  Service: Neurosurgery;  Laterality: N/A;  posterior   SUBOCCIPITAL CRANIECTOMY CERVICAL LAMINECTOMY N/A 06/23/2014   Procedure: SUBOCCIPITAL CRANIECTOMY CERVICAL LAMINECTOMY/DURAPLASTY;  Surgeon: Newman Pies, MD;  Location: [place] [facility];  Service: Neurosurgery;  Laterality: N/A;  suboccipital craniectomy with cervical laminectomy and duraplasty    Medical History: Past Medical History:  Diagnosis Date   Anxiety    Arthritis    rheumo possible   Chiari malformation    Depression    Dizziness    Headache(784.0)    History of kidney stones    Hypertension    Lupus (HCC)    Lupus (HCC)    Nerve damage    NECK   PONV (postoperative nausea and vomiting)    nausea only   Sleep apnea    TMJ arthritis     Family History: Family History  Problem Relation Age of Onset   Heart disease Father    Hypertension Father    Hypertension Mother    Thyroid disease Mother    Asthma Other    Depression Other    Diabetes Other    Migraines Other    Stroke Other     Social History   Socioeconomic History   Marital status: Single    Spouse name: Not on file   Number of children: Not on file   Years of  education: Not on file   Highest education level: Not on file  Occupational History   Not on file  Tobacco Use   Smoking status: Never   Smokeless tobacco: Never  Vaping Use   Vaping Use: Never used  Substance and Sexual Activity   Alcohol use: No    Alcohol/week: 0.0 standard drinks   Drug use: No   Sexual activity: Not on file  Other Topics Concern   Not on file  Social History Narrative   Lives with 3 children in 2 story home.  Unemployed.  Trying to get disability.  Education: high school.   Social Determinants of Health   Financial Resource Strain: Not on file  Food Insecurity: Not on file  Transportation Needs: Not on file  Physical Activity: Not on file  Stress: Not on file  Social Connections: Not on file  Intimate Partner Violence: Not on file      Review of Systems  Constitutional:  Positive for fatigue. Negative for chills and unexpected weight change.  HENT:  Negative for congestion, postnasal drip, rhinorrhea, sneezing and sore throat.   Eyes:  Negative for redness.  Respiratory:  Negative for cough, chest tightness and shortness of breath.   Cardiovascular:  Negative for chest pain and palpitations.  Gastrointestinal:  Negative for abdominal pain, constipation, diarrhea, nausea and vomiting.  Genitourinary:  Negative for dysuria and frequency.  Musculoskeletal:  Positive for arthralgias. Negative for back pain, joint swelling and neck pain.  Skin:  Negative for rash.  Neurological:  Positive for numbness. Negative for tremors.  Hematological:  Negative for adenopathy. Does not bruise/bleed easily.  Psychiatric/Behavioral:  Positive for sleep disturbance. Negative for behavioral problems (Depression) and suicidal ideas. The patient is not nervous/anxious.    Vital Signs: BP 125/82    Pulse 96    Temp 98.4 F (36.9 C)    Resp 16    Ht 5\' 6"  (1.676 m)    Wt 240 lb 3.2 oz (109 kg)    SpO2 97%    BMI 38.77 kg/m    Physical Exam Vitals and nursing note  reviewed.  Constitutional:      General: She is not in acute distress.    Appearance: She is well-developed. She is obese. She is not diaphoretic.  HENT:     Head: Normocephalic and atraumatic.     Mouth/Throat:     Pharynx: No oropharyngeal exudate.  Eyes:     Pupils: Pupils are equal, round, and reactive to light.  Neck:     Thyroid: No thyromegaly.     Vascular: No JVD.     Trachea: No tracheal deviation.  Cardiovascular:     Rate and Rhythm: Normal rate and regular rhythm.     Heart sounds: Normal heart sounds. No murmur heard.   No friction rub. No gallop.  Pulmonary:     Effort: Pulmonary effort is normal. No respiratory distress.     Breath sounds: No wheezing or rales.  Chest:     Chest wall: No tenderness.  Abdominal:     General: Bowel sounds are normal.     Palpations: Abdomen is soft.  Musculoskeletal:        General: Normal range of motion.     Cervical back: Normal range of motion and neck supple.  Lymphadenopathy:     Cervical: No cervical adenopathy.  Skin:    General: Skin is warm and dry.  Neurological:     Mental Status: She is alert and oriented to person, place, and time.     Cranial Nerves: No cranial nerve deficit.  Psychiatric:        Behavior: Behavior normal.        Thought Content: Thought content normal.        Judgment: Judgment normal.       Assessment/Plan: 1. Essential hypertension Well controlled, continue current medications  2. OSA (obstructive sleep apnea) Continue cpap nightly, will have visit for download with Claiborne Billings  3. B12 deficiency Will restart B12 injections - cyanocobalamin ((VITAMIN B-12)) injection 1,000 mcg  4. Neuralgia May continue gabapentin as needed - gabapentin (NEURONTIN) 300 MG capsule; TAKE 1 CAPSULE(300 MG) BY MOUTH THREE TIMES DAILY  Dispense: 270 capsule; Refill: 0  5. Raynaud's disease without gangrene Followed by  rheumatology  6. Lupus ([place]) Followed by rheumatology   General Counseling:  Rachael verbalizes understanding of the findings of todays visit and agrees with plan of treatment. I have discussed any further diagnostic evaluation that may be needed or ordered today. We also reviewed her medications today. she has been encouraged to call the office with any questions or concerns that should arise related to todays visit.    No orders of the defined types were placed in this encounter.   Meds ordered this encounter  Medications   gabapentin (NEURONTIN) 300 MG capsule    Sig: TAKE 1 CAPSULE(300 MG) BY MOUTH THREE TIMES DAILY    Dispense:  270 capsule    Refill:  0    ZERO refills remain on this prescription. Your patient is requesting advance approval of refills for this medication to [place]   cyanocobalamin ((VITAMIN B-12)) injection 1,000 mcg    This patient was seen by Drema Dallas, PA-C in collaboration with Dr. Clayborn Bigness as a part of collaborative care agreement.   Total time spent:30 Minutes Time spent includes review of chart, medications, test results, and follow up plan with the patient.      Dr Lavera Guise Internal medicine

## 2021-12-04 DIAGNOSIS — G894 Chronic pain syndrome: Secondary | ICD-10-CM | POA: Diagnosis not present

## 2021-12-04 DIAGNOSIS — Z79891 Long term (current) use of opiate analgesic: Secondary | ICD-10-CM | POA: Diagnosis not present

## 2021-12-05 ENCOUNTER — Other Ambulatory Visit: Payer: Self-pay | Admitting: Cardiovascular Disease

## 2021-12-12 ENCOUNTER — Other Ambulatory Visit: Payer: Self-pay

## 2021-12-12 ENCOUNTER — Ambulatory Visit (INDEPENDENT_AMBULATORY_CARE_PROVIDER_SITE_OTHER): Payer: Medicare Other

## 2021-12-12 DIAGNOSIS — G4733 Obstructive sleep apnea (adult) (pediatric): Secondary | ICD-10-CM

## 2021-12-12 NOTE — Progress Notes (Signed)
95 percentile pressure 5.1   95th percentile leak  16.0   apnea index   0.0/hr  apnea-hypopnea index  0.2 /hr   total days used  >4 hr 29 days  total days used <4 hr 25 days  Total compliance 83 percent  She is doing great no problems or questions at this time Pt was seen by Claiborne Billings  RRT/RCP  from [facility]

## 2021-12-13 ENCOUNTER — Ambulatory Visit (INDEPENDENT_AMBULATORY_CARE_PROVIDER_SITE_OTHER): Payer: Medicare Other | Admitting: Physician Assistant

## 2021-12-13 ENCOUNTER — Encounter: Payer: Self-pay | Admitting: Physician Assistant

## 2021-12-13 VITALS — BP 158/98 | HR 81 | Temp 98.3°F | Resp 16 | Ht 66.0 in | Wt 237.4 lb

## 2021-12-13 DIAGNOSIS — Z7189 Other specified counseling: Secondary | ICD-10-CM

## 2021-12-13 DIAGNOSIS — B370 Candidal stomatitis: Secondary | ICD-10-CM

## 2021-12-13 DIAGNOSIS — G4733 Obstructive sleep apnea (adult) (pediatric): Secondary | ICD-10-CM | POA: Diagnosis not present

## 2021-12-13 DIAGNOSIS — I10 Essential (primary) hypertension: Secondary | ICD-10-CM | POA: Diagnosis not present

## 2021-12-13 DIAGNOSIS — J4520 Mild intermittent asthma, uncomplicated: Secondary | ICD-10-CM | POA: Diagnosis not present

## 2021-12-13 MED ORDER — NYSTATIN 100000 UNIT/ML MT SUSP: 5.0000 mL | Freq: Four times a day (QID) | OROMUCOSAL | 0 refills | Status: DC

## 2021-12-13 MED ORDER — ALBUTEROL SULFATE HFA 108 (90 BASE) MCG/ACT IN AERS: 2.0000 | INHALATION_SPRAY | Freq: Every day | RESPIRATORY_TRACT | 5 refills | Status: DC

## 2021-12-13 NOTE — Progress Notes (Signed)
[facility] [place], [place] [address]  Internal MEDICINE  Office Visit Note  Patient Name: Morgan Kent  [id number]  [id number]  Date of Service: 12/16/2021  Chief Complaint  Patient presents with   Thrush    Mouth has been very dry, has tried biotene - going on for about 2 weeks (uses inhalers)     HPI Pt is here for a sick visit. -thrush in mouth--white patches in mouth and gums and is dry and painful for the last 2 weeks. Biotene is not really helping -doing well on cpap and had download yesterday. Benefiting from use and using 83% compliance with AHI well controlled at 0.2/hr. -BP 158/98 on recheck and is normally well controlled, may be elevated due to pain in mouth and will monitor closely at home  Current Medication:  Outpatient Encounter Medications as of 12/13/2021  Medication Sig   amLODipine (NORVASC) 5 MG tablet TAKE 1 TABLET(5 MG) BY MOUTH DAILY   AUROVELA FE 1.5/30 1.5-30 MG-MCG tablet TAKE 1 TABLET BY MOUTH AT BEDTIME   carvedilol (COREG) 25 MG tablet TAKE 1 TABLET(25 MG) BY MOUTH TWICE DAILY   cetirizine (ZYRTEC) 10 MG tablet Take 1 tablet (10 mg total) by mouth daily.   clobetasol (TEMOVATE) 0.05 % external solution Apply 1 application topically 2 (two) times daily as needed. Apply to scalp   cyclobenzaprine (FLEXERIL) 10 MG tablet Take 1 tablet (10 mg total) by mouth 3 (three) times daily as needed.   diclofenac Sodium (VOLTAREN) 1 % GEL Apply 2 g topically 4 (four) times daily.   doxycycline (VIBRA-TABS) 100 MG tablet Take 1 tablet (100 mg total) by mouth 2 (two) times daily. With food   ferrous sulfate (FERROUSUL) 325 (65 FE) MG tablet Take 1 tablet (325 mg total) by mouth daily with breakfast.   fluticasone (FLONASE) 50 MCG/ACT nasal spray Place 1 spray into both nostrils at bedtime.   gabapentin (NEURONTIN) 300 MG capsule TAKE 1 CAPSULE(300 MG) BY MOUTH THREE TIMES DAILY   hydrochlorothiazide (HYDRODIURIL) 12.5 MG tablet TAKE 1  TABLET(12.5 MG) BY MOUTH DAILY   hydroxychloroquine (PLAQUENIL) 200 MG tablet Take 200 mg by mouth daily.    ketoconazole (NIZORAL) 2 % shampoo APPLY TOPICALLY 1 TIME FOR 1 DOSE   ketoconazole (NIZORAL) 2 % shampoo APPLY TOPICALLY TO THE AFFECTED AREA EVERY 3 DAYS, LET SIT 5 MINUTES BEFORE RINSING OUT   lisinopril (ZESTRIL) 5 MG tablet TAKE 1 TABLET(5 MG) BY MOUTH DAILY   meloxicam (MOBIC) 7.5 MG tablet Take 1 tablet (7.5 mg total) by mouth 2 (two) times daily.   mometasone (ELOCON) 0.1 % lotion APPLY TOPICALLY TO THE SCALP DAILY AS NEEDED FOR LUPUS OR FLARES   mometasone (ELOCON) 0.1 % ointment APPLY TO LUPUS SORES ON FACE TOPICALLY DAILY AS NEEDED FOR FLARES   montelukast (SINGULAIR) 10 MG tablet Take 1 tablet (10 mg total) by mouth at bedtime.   mupirocin ointment (BACTROBAN) 2 % Apply to open area on skin qd-bid   nystatin (MYCOSTATIN) 100000 UNIT/ML suspension Take 5 mLs (500,000 Units total) by mouth 4 (four) times daily.   oxyCODONE-acetaminophen (PERCOCET) 7.5-325 MG tablet Take 1 tablet by mouth every 4 (four) hours as needed for severe pain.   Polyethyl Glycol-Propyl Glycol (SYSTANE ULTRA) 0.4-0.3 % SOLN Place 1 drop into both eyes daily as needed (for dry eyes).    PULMICORT FLEXHALER 90 MCG/ACT inhaler INHALE 2 PUFFS INTO THE LUNGS TWICE DAILY   tacrolimus (PROTOPIC) 0.1 % ointment APPLY  TOPICALLY TO THE AFFECTED AREA DAILY   [DISCONTINUED] albuterol (VENTOLIN HFA) 108 (90 Base) MCG/ACT inhaler Inhale 2 puffs into the lungs daily.   [DISCONTINUED] Norethindrone Acetate-Ethinyl Estradiol (AUROVELA 1.5/30) 1.5-30 MG-MCG tablet Take 1 tablet by mouth daily.   albuterol (VENTOLIN HFA) 108 (90 Base) MCG/ACT inhaler Inhale 2 puffs into the lungs daily.   No facility-administered encounter medications on file as of 12/13/2021.      Medical History: Past Medical History:  Diagnosis Date   Anxiety    Arthritis    rheumo possible   Chiari malformation    Depression    Dizziness     Headache(784.0)    History of kidney stones    Hypertension    Lupus (HCC)    Lupus (HCC)    Nerve damage    NECK   PONV (postoperative nausea and vomiting)    nausea only   Sleep apnea    TMJ arthritis      Vital Signs: BP (!) 158/98 Comment: 179/105   Pulse 81    Temp 98.3 F (36.8 C)    Resp 16    Ht 5\' 6"  (1.676 m)    Wt 237 lb 6.4 oz (107.7 kg)    SpO2 91%    BMI 38.32 kg/m    Review of Systems  Constitutional:  Negative for fatigue and fever.  HENT:  Positive for mouth sores and sore throat. Negative for congestion and postnasal drip.   Respiratory:  Negative for cough.   Cardiovascular:  Negative for chest pain.  Genitourinary:  Negative for flank pain.  Psychiatric/Behavioral: Negative.     Physical Exam Vitals and nursing note reviewed.  Constitutional:      General: She is not in acute distress.    Appearance: She is well-developed. She is obese. She is not diaphoretic.  HENT:     Head: Normocephalic and atraumatic.     Mouth/Throat:     Pharynx: No oropharyngeal exudate.     Comments: White patches along tongue and gums Eyes:     Pupils: Pupils are equal, round, and reactive to light.  Neck:     Thyroid: No thyromegaly.     Vascular: No JVD.     Trachea: No tracheal deviation.  Cardiovascular:     Rate and Rhythm: Normal rate and regular rhythm.     Heart sounds: Normal heart sounds. No murmur heard.   No friction rub. No gallop.  Pulmonary:     Effort: Pulmonary effort is normal. No respiratory distress.     Breath sounds: No wheezing or rales.  Chest:     Chest wall: No tenderness.  Abdominal:     General: Bowel sounds are normal.     Palpations: Abdomen is soft.  Musculoskeletal:        General: Normal range of motion.     Cervical back: Normal range of motion and neck supple.  Lymphadenopathy:     Cervical: No cervical adenopathy.  Skin:    General: Skin is warm and dry.  Neurological:     Mental Status: She is alert and oriented to  person, place, and time.     Cranial Nerves: No cranial nerve deficit.  Psychiatric:        Behavior: Behavior normal.        Thought Content: Thought content normal.        Judgment: Judgment normal.      Assessment/Plan: 1. Thrush Will treat with nystatin rinse, if not improving will  contact office. - nystatin (MYCOSTATIN) 100000 UNIT/ML suspension; Take 5 mLs (500,000 Units total) by mouth 4 (four) times daily.  Dispense: 60 mL; Refill: 0  2. Mild intermittent asthma without complication May continue inhalers as prescribed and as indicated. Advised to rinse mouth after inhaler use - albuterol (VENTOLIN HFA) 108 (90 Base) MCG/ACT inhaler; Inhale 2 puffs into the lungs daily.  Dispense: 18 g; Refill: 5  3. Essential hypertension Elevated in office, possibly due to pain--will monitor closely and call if not improving  4. Obstructive sleep apnea Continue cpap nightly  5. CPAP use counseling CPAP couseling-Discussed importance of adequate CPAP use as well as proper care and cleaning techniques of machine and all supplies.    General Counseling: Shalena verbalizes understanding of the findings of todays visit and agrees with plan of treatment. I have discussed any further diagnostic evaluation that may be needed or ordered today. We also reviewed her medications today. she has been encouraged to call the office with any questions or concerns that should arise related to todays visit.    Counseling:    No orders of the defined types were placed in this encounter.   Meds ordered this encounter  Medications   nystatin (MYCOSTATIN) 100000 UNIT/ML suspension    Sig: Take 5 mLs (500,000 Units total) by mouth 4 (four) times daily.    Dispense:  60 mL    Refill:  0   albuterol (VENTOLIN HFA) 108 (90 Base) MCG/ACT inhaler    Sig: Inhale 2 puffs into the lungs daily.    Dispense:  18 g    Refill:  5    Time spent:30 Minutes

## 2021-12-14 ENCOUNTER — Other Ambulatory Visit: Payer: Self-pay | Admitting: Physician Assistant

## 2021-12-14 DIAGNOSIS — Z3041 Encounter for surveillance of contraceptive pills: Secondary | ICD-10-CM

## 2021-12-17 ENCOUNTER — Ambulatory Visit (INDEPENDENT_AMBULATORY_CARE_PROVIDER_SITE_OTHER): Payer: Medicare Other | Admitting: Dermatology

## 2021-12-17 ENCOUNTER — Encounter: Payer: Self-pay | Admitting: Dermatology

## 2021-12-17 ENCOUNTER — Other Ambulatory Visit: Payer: Self-pay

## 2021-12-17 DIAGNOSIS — L819 Disorder of pigmentation, unspecified: Secondary | ICD-10-CM

## 2021-12-17 DIAGNOSIS — M3210 Systemic lupus erythematosus, organ or system involvement unspecified: Secondary | ICD-10-CM | POA: Diagnosis not present

## 2021-12-17 DIAGNOSIS — B370 Candidal stomatitis: Secondary | ICD-10-CM

## 2021-12-17 DIAGNOSIS — L905 Scar conditions and fibrosis of skin: Secondary | ICD-10-CM

## 2021-12-17 DIAGNOSIS — K130 Diseases of lips: Secondary | ICD-10-CM | POA: Diagnosis not present

## 2021-12-17 DIAGNOSIS — L930 Discoid lupus erythematosus: Secondary | ICD-10-CM

## 2021-12-17 DIAGNOSIS — L700 Acne vulgaris: Secondary | ICD-10-CM

## 2021-12-17 MED ORDER — TACROLIMUS 0.1 % EX OINT
TOPICAL_OINTMENT | CUTANEOUS | 3 refills | Status: DC
Start: 2021-12-17 — End: 2022-09-23

## 2021-12-17 MED ORDER — DAPSONE 7.5 % EX GEL: 1.0000 "application " | Freq: Every day | CUTANEOUS | 6 refills | Status: DC

## 2021-12-17 MED ORDER — MUPIROCIN 2 % EX OINT: TOPICAL_OINTMENT | CUTANEOUS | 5 refills | Status: DC

## 2021-12-17 MED ORDER — FLUCONAZOLE 200 MG PO TABS: 200.0000 mg | ORAL_TABLET | Freq: Every day | ORAL | 1 refills | Status: DC

## 2021-12-17 MED ORDER — TACROLIMUS 0.1 % EX OINT: TOPICAL_OINTMENT | CUTANEOUS | 3 refills | Status: DC

## 2021-12-17 MED ORDER — MOMETASONE FUROATE 0.1 % EX OINT
TOPICAL_OINTMENT | CUTANEOUS | 6 refills | Status: DC
Start: 2021-12-17 — End: 2022-06-17

## 2021-12-17 NOTE — Patient Instructions (Signed)

## 2021-12-17 NOTE — Progress Notes (Signed)
° °  Follow-Up Visit   Subjective  Morgan Kent is a 41 y.o. female who presents for the following: Follow-up (Discoid lupus follow up - treating with Mupirocin and Mometasone. She was recently diagnosed with thrush and was given Nystatin but she ran out.).  The following portions of the chart were reviewed this encounter and updated as appropriate:   Tobacco   Allergies   Meds   Problems   Med Hx   Surg Hx   Fam Hx      Review of Systems:  No other skin or systemic complaints except as noted in HPI or Assessment and Plan.  Objective  Well appearing patient in no apparent distress; mood and affect are within normal limits.  A focused examination was performed including face, mouth. Relevant physical exam findings are noted in the Assessment and Plan.  Face Thin hyperpigmented plaques Mild comedonal acne Tongue - thick white film            Assessment & Plan  Thrush, oral Tongue Start Diflucan 200 mg 1 po x 1 dose.  fluconazole (DIFLUCAN) 200 MG tablet - Tongue Take 1 tablet (200 mg total) by mouth daily.  Angular cheilitis Corners of mouth Chronic and persistent condition with duration or expected duration over one year. Condition is symptomatic / bothersome to patient. Not to goal. - Continue lip balm as directed, Dr. Luvenia Heller Cortibalm or Aquaphor recommended Restart Tacrolimus 0.1% oint bid prn  Related Medications tacrolimus (PROTOPIC) 0.1 % ointment APPLY TOPICALLY TO THE AFFECTED AREA DAILY  Discoid lupus erythematosus Face Chronic and persistent with scarring and dyschromia Flared Cont Mometasone oint to active areas bid for 2 weeks, then decrease to 5d/wk until clear, then prn flares Aczone cream qd aa. Cont Mupirocin oint qd to open sores face and scalp  Plaquenil per Rheumatology. mometasone (ELOCON) 0.1 % ointment - Face APPLY TO LUPUS SORES ON FACE TOPICALLY DAILY AS NEEDED FOR FLARES  mupirocin ointment (BACTROBAN) 2 % - Face Apply to open area  on skin qd-bid  Acne vulgaris Face Start Aczone 7.5% gel qd - to be used for acne and for Discoid Lupus Dapsone 7.5 % GEL - Face Apply 1 application topically daily.  Systemic lupus erythematosus with other organ involvement, unspecified SLE type ([place]) body Controlled by Dr Jefm Bryant pt taking Plaquenil tablets.  Pt to continue f/u with Dr. Jefm Bryant  Return in about 6 months (around 06/16/2022).  I, Ashok Cordia, CMA, am acting as scribe for Sarina Ser, MD . Documentation: I have reviewed the above documentation for accuracy and completeness, and I agree with the above.  Sarina Ser, MD

## 2021-12-19 ENCOUNTER — Other Ambulatory Visit: Payer: Self-pay | Admitting: Physician Assistant

## 2021-12-19 DIAGNOSIS — B370 Candidal stomatitis: Secondary | ICD-10-CM

## 2022-01-04 ENCOUNTER — Other Ambulatory Visit: Payer: Self-pay | Admitting: Dermatology

## 2022-01-04 DIAGNOSIS — L930 Discoid lupus erythematosus: Secondary | ICD-10-CM

## 2022-01-08 ENCOUNTER — Telehealth: Payer: Self-pay

## 2022-01-08 DIAGNOSIS — G894 Chronic pain syndrome: Secondary | ICD-10-CM | POA: Diagnosis not present

## 2022-01-08 DIAGNOSIS — Z79891 Long term (current) use of opiate analgesic: Secondary | ICD-10-CM | POA: Diagnosis not present

## 2022-01-08 NOTE — Telephone Encounter (Signed)
Verbal order for [facility] supplies signed by provider and placed in [facility] folder.

## 2022-01-11 ENCOUNTER — Other Ambulatory Visit: Payer: Self-pay | Admitting: Physician Assistant

## 2022-01-11 DIAGNOSIS — J4520 Mild intermittent asthma, uncomplicated: Secondary | ICD-10-CM

## 2022-01-30 DIAGNOSIS — Q0700 Arnold-Chiari syndrome without spina bifida or hydrocephalus: Secondary | ICD-10-CM | POA: Diagnosis not present

## 2022-01-30 DIAGNOSIS — G5603 Carpal tunnel syndrome, bilateral upper limbs: Secondary | ICD-10-CM | POA: Diagnosis not present

## 2022-01-30 DIAGNOSIS — Z79899 Other long term (current) drug therapy: Secondary | ICD-10-CM | POA: Diagnosis not present

## 2022-01-30 DIAGNOSIS — M329 Systemic lupus erythematosus, unspecified: Secondary | ICD-10-CM | POA: Diagnosis not present

## 2022-01-30 DIAGNOSIS — M65331 Trigger finger, right middle finger: Secondary | ICD-10-CM | POA: Diagnosis not present

## 2022-01-30 DIAGNOSIS — L930 Discoid lupus erythematosus: Secondary | ICD-10-CM | POA: Diagnosis not present

## 2022-01-30 DIAGNOSIS — I7300 Raynaud's syndrome without gangrene: Secondary | ICD-10-CM | POA: Diagnosis not present

## 2022-02-05 DIAGNOSIS — Z79891 Long term (current) use of opiate analgesic: Secondary | ICD-10-CM | POA: Diagnosis not present

## 2022-02-05 DIAGNOSIS — G894 Chronic pain syndrome: Secondary | ICD-10-CM | POA: Diagnosis not present

## 2022-02-08 ENCOUNTER — Other Ambulatory Visit: Payer: Self-pay | Admitting: Dermatology

## 2022-02-08 DIAGNOSIS — L219 Seborrheic dermatitis, unspecified: Secondary | ICD-10-CM

## 2022-02-22 ENCOUNTER — Ambulatory Visit (INDEPENDENT_AMBULATORY_CARE_PROVIDER_SITE_OTHER): Payer: Medicare Other | Admitting: Physician Assistant

## 2022-02-22 ENCOUNTER — Encounter: Payer: Self-pay | Admitting: Physician Assistant

## 2022-02-22 DIAGNOSIS — E559 Vitamin D deficiency, unspecified: Secondary | ICD-10-CM | POA: Diagnosis not present

## 2022-02-22 DIAGNOSIS — J4520 Mild intermittent asthma, uncomplicated: Secondary | ICD-10-CM

## 2022-02-22 DIAGNOSIS — G4733 Obstructive sleep apnea (adult) (pediatric): Secondary | ICD-10-CM

## 2022-02-22 DIAGNOSIS — I10 Essential (primary) hypertension: Secondary | ICD-10-CM | POA: Diagnosis not present

## 2022-02-22 DIAGNOSIS — E782 Mixed hyperlipidemia: Secondary | ICD-10-CM | POA: Diagnosis not present

## 2022-02-22 DIAGNOSIS — E538 Deficiency of other specified B group vitamins: Secondary | ICD-10-CM

## 2022-02-22 DIAGNOSIS — R7989 Other specified abnormal findings of blood chemistry: Secondary | ICD-10-CM | POA: Diagnosis not present

## 2022-02-22 DIAGNOSIS — R5383 Other fatigue: Secondary | ICD-10-CM | POA: Diagnosis not present

## 2022-02-22 DIAGNOSIS — M329 Systemic lupus erythematosus, unspecified: Secondary | ICD-10-CM

## 2022-02-22 DIAGNOSIS — M064 Inflammatory polyarthropathy: Secondary | ICD-10-CM

## 2022-02-22 DIAGNOSIS — Z3041 Encounter for surveillance of contraceptive pills: Secondary | ICD-10-CM

## 2022-02-22 MED ORDER — NORETHINDRONE ACET-ETHINYL EST 1.5-30 MG-MCG PO TABS: 1.0000 | ORAL_TABLET | Freq: Every day | ORAL | 3 refills | Status: DC

## 2022-02-22 MED ORDER — CYANOCOBALAMIN 1000 MCG/ML IJ SOLN
1000.0000 ug | Freq: Once | INTRAMUSCULAR | Status: AC
  Administered 2022-02-22: 1000 ug via INTRAMUSCULAR

## 2022-02-22 NOTE — Progress Notes (Signed)
[facility] ?[address] ?[place], [place] [address] ? ?Internal MEDICINE  ?Office Visit Note ? ?Patient Name: Morgan Kent ? [id number]  ?[id number] ? ?Date of Service: 02/22/2022 ? ?Chief Complaint  ?Patient presents with  ? Follow-up  ? Depression  ? Hypertension  ? Anxiety  ? Medication Refill  ? ? ?HPI ?Pt is here for routine follow up and has no new concerns today ?-She reports that she has recently started Methotrexate '10mg'$  (4 tabs of 2.'5mg'$ ) once weekly in addition to plaquenil by her rheumatologist for her lupus and joint pain as her hands were hurting more. States they have been monitoring her labs as well and they have been good. ?-Continues to use cpap nightly, states she was told she would have to pay for her machine due to no follow up compliance appt, however she did have a visit and download showing compliance and states she hasn't heard anything else since then so thinks it is taken care of now. She will follow up with them if any further concerns ?-BP stable ?-CPE next visit therefore will order other routine labs not done by outside provider ? ?Current Medication: ?Outpatient Encounter Medications as of 02/22/2022  ?Medication Sig  ? albuterol (VENTOLIN HFA) 108 (90 Base) MCG/ACT inhaler Inhale 2 puffs into the lungs daily.  ? amLODipine (NORVASC) 5 MG tablet TAKE 1 TABLET(5 MG) BY MOUTH DAILY  ? carvedilol (COREG) 25 MG tablet TAKE 1 TABLET(25 MG) BY MOUTH TWICE DAILY  ? cetirizine (ZYRTEC) 10 MG tablet Take 1 tablet (10 mg total) by mouth daily.  ? clobetasol (TEMOVATE) 0.05 % external solution Apply 1 application topically 2 (two) times daily as needed. Apply to scalp  ? cyclobenzaprine (FLEXERIL) 10 MG tablet Take 1 tablet (10 mg total) by mouth 3 (three) times daily as needed.  ? Dapsone 7.5 % GEL Apply 1 application topically daily.  ? diclofenac Sodium (VOLTAREN) 1 % GEL Apply 2 g topically 4 (four) times daily.  ? ferrous sulfate (FERROUSUL) 325 (65 FE) MG tablet Take 1 tablet  (325 mg total) by mouth daily with breakfast.  ? fluticasone (FLONASE) 50 MCG/ACT nasal spray Place 1 spray into both nostrils at bedtime.  ? gabapentin (NEURONTIN) 300 MG capsule TAKE 1 CAPSULE(300 MG) BY MOUTH THREE TIMES DAILY  ? hydrochlorothiazide (HYDRODIURIL) 12.5 MG tablet TAKE 1 TABLET(12.5 MG) BY MOUTH DAILY  ? hydroxychloroquine (PLAQUENIL) 200 MG tablet Take 200 mg by mouth daily.   ? ketoconazole (NIZORAL) 2 % shampoo APPLY 1 APPLICATION TOPICALLY EVERY 3 DAYS. LET SIT 5 MINUTES BEFORE RINSING OUT  ? lisinopril (ZESTRIL) 5 MG tablet TAKE 1 TABLET(5 MG) BY MOUTH DAILY  ? meloxicam (MOBIC) 7.5 MG tablet Take 1 tablet (7.5 mg total) by mouth 2 (two) times daily.  ? mometasone (ELOCON) 0.1 % ointment APPLY TO LUPUS SORES ON FACE TOPICALLY DAILY AS NEEDED FOR FLARES  ? montelukast (SINGULAIR) 10 MG tablet Take 1 tablet (10 mg total) by mouth at bedtime.  ? mupirocin ointment (BACTROBAN) 2 % Apply to open area on skin qd-bid  ? nystatin (MYCOSTATIN) 100000 UNIT/ML suspension Take 5 mLs (500,000 Units total) by mouth 4 (four) times daily.  ? oxyCODONE-acetaminophen (PERCOCET) 7.5-325 MG tablet Take 1 tablet by mouth every 4 (four) hours as needed for severe pain.  ? Polyethyl Glycol-Propyl Glycol (SYSTANE ULTRA) 0.4-0.3 % SOLN Place 1 drop into both eyes daily as needed (for dry eyes).   ? PULMICORT FLEXHALER 90 MCG/ACT inhaler INHALE 2 PUFFS INTO  THE LUNGS TWICE DAILY  ? tacrolimus (PROTOPIC) 0.1 % ointment APPLY TOPICALLY TO THE AFFECTED AREA DAILY  ? [DISCONTINUED] AUROVELA FE 1.5/30 1.5-30 MG-MCG tablet TAKE 1 TABLET BY MOUTH AT BEDTIME  ? [DISCONTINUED] doxycycline (VIBRA-TABS) 100 MG tablet Take 1 tablet (100 mg total) by mouth 2 (two) times daily. With food  ? [DISCONTINUED] fluconazole (DIFLUCAN) 200 MG tablet Take 1 tablet (200 mg total) by mouth daily.  ? [DISCONTINUED] JUNEL 1.5/30 1.5-30 MG-MCG tablet TAKE 1 TABLET BY MOUTH DAILY  ? Norethindrone Acetate-Ethinyl Estradiol (JUNEL 1.5/30) 1.5-30  MG-MCG tablet Take 1 tablet by mouth daily.  ? [EXPIRED] cyanocobalamin ((VITAMIN B-12)) injection 1,000 mcg   ? ?No facility-administered encounter medications on file as of 02/22/2022.  ? ? ?Surgical History: ?Past Surgical History:  ?Procedure Laterality Date  ? carpel tunnel  Left 2014  ? CRANIOTOMY  14,06  ? chiari  ? DENTAL SURGERY    ? HERNIA REPAIR    ? umbilical  ? PLACEMENT OF LUMBAR DRAIN N/A 07/07/2014  ? Procedure: PLACEMENT OF LUMBAR DRAIN AND CLOSURE OF CERVICAL INCISION;  Surgeon: Newman Pies, MD;  Location: [facility];  Service: Neurosurgery;  Laterality: N/A;  ? SUBOCCIPITAL CRANIECTOMY CERVICAL LAMINECTOMY N/A 09/29/2013  ? Procedure: SUBOCCIPITAL CRANIECTOMY CERVICAL LAMINECTOMY/DURAPLASTY;  Surgeon: Ophelia Charter, MD;  Location: [facility];  Service: Neurosurgery;  Laterality: N/A;  posterior  ? SUBOCCIPITAL CRANIECTOMY CERVICAL LAMINECTOMY N/A 06/23/2014  ? Procedure: SUBOCCIPITAL CRANIECTOMY CERVICAL LAMINECTOMY/DURAPLASTY;  Surgeon: Newman Pies, MD;  Location: [facility];  Service: Neurosurgery;  Laterality: N/A;  suboccipital craniectomy with cervical laminectomy and duraplasty  ? ? ?Medical History: ?Past Medical History:  ?Diagnosis Date  ? Anxiety   ? Arthritis   ? rheumo possible  ? Chiari malformation   ? Depression   ? Dizziness   ? Headache(784.0)   ? History of kidney stones   ? Hypertension   ? Lupus ([place])   ? Lupus ([place])   ? Nerve damage   ? NECK  ? PONV (postoperative nausea and vomiting)   ? nausea only  ? Sleep apnea   ? TMJ arthritis   ? ? ?Family History: ?Family History  ?Problem Relation Age of Onset  ? Heart disease Father   ? Hypertension Father   ? Hypertension Mother   ? Thyroid disease Mother   ? Asthma Other   ? Depression Other   ? Diabetes Other   ? Migraines Other   ? Stroke Other   ? ? ?Social History  ? ?Socioeconomic History  ? Marital status: Single  ?  Spouse name: Not on file  ? Number of children: Not on file  ? Years of education: Not on file   ? Highest education level: Not on file  ?Occupational History  ? Not on file  ?Tobacco Use  ? Smoking status: Never  ? Smokeless tobacco: Never  ?Vaping Use  ? Vaping Use: Never used  ?Substance and Sexual Activity  ? Alcohol use: No  ?  Alcohol/week: 0.0 standard drinks  ? Drug use: No  ? Sexual activity: Not on file  ?Other Topics Concern  ? Not on file  ?Social History Narrative  ? Lives with 3 children in 2 story home.  Unemployed.  Trying to get disability.  Education: high school.  ? ?Social Determinants of Health  ? ?Financial Resource Strain: Not on file  ?Food Insecurity: Not on file  ?Transportation Needs: Not on file  ?Physical Activity: Not on file  ?  Stress: Not on file  ?Social Connections: Not on file  ?Intimate Partner Violence: Not on file  ? ? ? ? ?Review of Systems  ?Constitutional:  Negative for chills and unexpected weight change.  ?HENT:  Negative for congestion, rhinorrhea, sneezing and sore throat.   ?Eyes:  Negative for redness.  ?Respiratory:  Negative for cough, chest tightness and shortness of breath.   ?Cardiovascular:  Negative for chest pain and palpitations.  ?Gastrointestinal:  Negative for abdominal pain, constipation, diarrhea, nausea and vomiting.  ?Genitourinary:  Negative for dysuria and frequency.  ?Musculoskeletal:  Positive for arthralgias. Negative for back pain, joint swelling and neck pain.  ?Skin:  Negative for rash.  ?Neurological: Negative.  Negative for tremors and numbness.  ?Hematological:  Negative for adenopathy. Does not bruise/bleed easily.  ?Psychiatric/Behavioral:  Negative for behavioral problems (Depression), sleep disturbance and suicidal ideas. The patient is not nervous/anxious.   ? ?Vital Signs: ?BP 116/68   Pulse 83   Temp 97.8 ?F (36.6 ?C)   Resp 16   Ht '5\' 6"'$  (1.676 m)   Wt 233 lb 12.8 oz (106.1 kg)   SpO2 99%   BMI 37.74 kg/m?  ? ? ?Physical Exam ?Vitals and nursing note reviewed.  ?Constitutional:   ?   General: She is not in acute distress. ?    Appearance: She is well-developed. She is obese. She is not diaphoretic.  ?HENT:  ?   Head: Normocephalic and atraumatic.  ?   Mouth/Throat:  ?   Pharynx: No oropharyngeal exudate.  ?Eyes:  ?   Pupils: Pupils a

## 2022-03-05 ENCOUNTER — Other Ambulatory Visit: Payer: Self-pay | Admitting: Internal Medicine

## 2022-03-05 ENCOUNTER — Other Ambulatory Visit: Payer: Self-pay | Admitting: Physician Assistant

## 2022-03-05 DIAGNOSIS — B370 Candidal stomatitis: Secondary | ICD-10-CM

## 2022-03-05 DIAGNOSIS — G894 Chronic pain syndrome: Secondary | ICD-10-CM | POA: Diagnosis not present

## 2022-03-05 DIAGNOSIS — Z79891 Long term (current) use of opiate analgesic: Secondary | ICD-10-CM | POA: Diagnosis not present

## 2022-03-05 DIAGNOSIS — M792 Neuralgia and neuritis, unspecified: Secondary | ICD-10-CM

## 2022-03-05 MED ORDER — NYSTATIN 100000 UNIT/ML MT SUSP: 5.0000 mL | Freq: Four times a day (QID) | OROMUCOSAL | 2 refills | Status: DC

## 2022-03-05 MED ORDER — GABAPENTIN 300 MG PO CAPS: ORAL_CAPSULE | ORAL | 1 refills | Status: DC

## 2022-03-05 MED ORDER — CLOBETASOL PROPIONATE 0.05 % EX SOLN
1.0000 | Freq: Two times a day (BID) | CUTANEOUS | 2 refills | Status: DC | PRN
Start: 2022-03-05 — End: 2023-09-17

## 2022-03-05 MED ORDER — HYDROXYCHLOROQUINE SULFATE 200 MG PO TABS: 200.0000 mg | ORAL_TABLET | Freq: Every day | ORAL | 2 refills | Status: AC

## 2022-03-08 ENCOUNTER — Other Ambulatory Visit: Payer: Self-pay

## 2022-03-08 DIAGNOSIS — J309 Allergic rhinitis, unspecified: Secondary | ICD-10-CM

## 2022-03-08 MED ORDER — FLUTICASONE PROPIONATE 50 MCG/ACT NA SUSP: 1.0000 | Freq: Every day | NASAL | 3 refills | Status: DC

## 2022-03-14 DIAGNOSIS — Z20822 Contact with and (suspected) exposure to covid-19: Secondary | ICD-10-CM | POA: Diagnosis not present

## 2022-04-05 DIAGNOSIS — Z79899 Other long term (current) drug therapy: Secondary | ICD-10-CM | POA: Diagnosis not present

## 2022-04-05 DIAGNOSIS — I7300 Raynaud's syndrome without gangrene: Secondary | ICD-10-CM | POA: Diagnosis not present

## 2022-04-05 DIAGNOSIS — L930 Discoid lupus erythematosus: Secondary | ICD-10-CM | POA: Diagnosis not present

## 2022-04-05 DIAGNOSIS — M329 Systemic lupus erythematosus, unspecified: Secondary | ICD-10-CM | POA: Diagnosis not present

## 2022-04-09 DIAGNOSIS — M25512 Pain in left shoulder: Secondary | ICD-10-CM | POA: Diagnosis not present

## 2022-04-09 DIAGNOSIS — Z713 Dietary counseling and surveillance: Secondary | ICD-10-CM | POA: Diagnosis not present

## 2022-04-09 DIAGNOSIS — Z79891 Long term (current) use of opiate analgesic: Secondary | ICD-10-CM | POA: Diagnosis not present

## 2022-04-09 DIAGNOSIS — G909 Disorder of the autonomic nervous system, unspecified: Secondary | ICD-10-CM | POA: Diagnosis not present

## 2022-04-09 DIAGNOSIS — M5136 Other intervertebral disc degeneration, lumbar region: Secondary | ICD-10-CM | POA: Diagnosis not present

## 2022-04-09 DIAGNOSIS — M542 Cervicalgia: Secondary | ICD-10-CM | POA: Diagnosis not present

## 2022-04-09 DIAGNOSIS — M79662 Pain in left lower leg: Secondary | ICD-10-CM | POA: Diagnosis not present

## 2022-04-09 DIAGNOSIS — G629 Polyneuropathy, unspecified: Secondary | ICD-10-CM | POA: Diagnosis not present

## 2022-04-09 DIAGNOSIS — Z724 Inappropriate diet and eating habits: Secondary | ICD-10-CM | POA: Diagnosis not present

## 2022-04-09 DIAGNOSIS — R519 Headache, unspecified: Secondary | ICD-10-CM | POA: Diagnosis not present

## 2022-04-09 DIAGNOSIS — Z6839 Body mass index (BMI) 39.0-39.9, adult: Secondary | ICD-10-CM | POA: Diagnosis not present

## 2022-04-09 DIAGNOSIS — M545 Low back pain, unspecified: Secondary | ICD-10-CM | POA: Diagnosis not present

## 2022-04-09 DIAGNOSIS — M79661 Pain in right lower leg: Secondary | ICD-10-CM | POA: Diagnosis not present

## 2022-04-09 DIAGNOSIS — G894 Chronic pain syndrome: Secondary | ICD-10-CM | POA: Diagnosis not present

## 2022-04-16 ENCOUNTER — Telehealth (INDEPENDENT_AMBULATORY_CARE_PROVIDER_SITE_OTHER): Payer: Medicare Other | Admitting: Internal Medicine

## 2022-04-16 ENCOUNTER — Encounter: Payer: Self-pay | Admitting: Internal Medicine

## 2022-04-16 VITALS — Temp 97.2°F | Resp 16 | Ht 66.0 in

## 2022-04-16 DIAGNOSIS — J4541 Moderate persistent asthma with (acute) exacerbation: Secondary | ICD-10-CM

## 2022-04-16 DIAGNOSIS — J45909 Unspecified asthma, uncomplicated: Secondary | ICD-10-CM | POA: Diagnosis not present

## 2022-04-16 DIAGNOSIS — Z6836 Body mass index (BMI) 36.0-36.9, adult: Secondary | ICD-10-CM | POA: Diagnosis not present

## 2022-04-16 DIAGNOSIS — B3731 Acute candidiasis of vulva and vagina: Secondary | ICD-10-CM | POA: Diagnosis not present

## 2022-04-16 DIAGNOSIS — G935 Compression of brain: Secondary | ICD-10-CM | POA: Diagnosis not present

## 2022-04-16 MED ORDER — AZITHROMYCIN 250 MG PO TABS: ORAL_TABLET | ORAL | 0 refills | Status: DC

## 2022-04-16 MED ORDER — BREZTRI AEROSPHERE 160-9-4.8 MCG/ACT IN AERO: 2.0000 | INHALATION_SPRAY | Freq: Two times a day (BID) | RESPIRATORY_TRACT | 11 refills | Status: DC

## 2022-04-16 MED ORDER — FLUCONAZOLE 150 MG PO TABS: ORAL_TABLET | ORAL | 0 refills | Status: DC

## 2022-04-16 NOTE — Progress Notes (Signed)
[facility], [place] [address]  Internal MEDICINE  Telephone Visit  Patient Name: Morgan Kent  [id number]  [id number]  Date of Service: 04/30/2022  I connected with the patient at 1157 am by telephone and verified the patients identity using two identifiers.   I discussed the limitations, risks, security and privacy concerns of performing an evaluation and management service by telephone and the availability of in person appointments. I also discussed with the patient that there may be a patient responsible charge related to the service.  The patient expressed understanding and agrees to proceed.    Chief Complaint  Patient presents with   Telephone Screen   Telephone Assessment    Video call sometimes does not work for patient, use: [phone]   Cough    Currently doing at-home covid test, will have results by visit time   Sore Throat    HPI  Pt is connected for acute and sick visit. C/o pain in her throat, cough and wheezing, feels tight in her chest  Using her Ventolin inhaler  Feels feverish.   Current Medication: Outpatient Encounter Medications as of 04/16/2022  Medication Sig   albuterol (VENTOLIN HFA) 108 (90 Base) MCG/ACT inhaler Inhale 2 puffs into the lungs daily.   amLODipine (NORVASC) 5 MG tablet TAKE 1 TABLET(5 MG) BY MOUTH DAILY   azithromycin (ZITHROMAX) 250 MG tablet Take one tab a day for 10 days for uri   Budeson-Glycopyrrol-Formoterol (BREZTRI AEROSPHERE) 160-9-4.8 MCG/ACT AERO Inhale 2 puffs into the lungs 2 (two) times daily.   carvedilol (COREG) 25 MG tablet TAKE 1 TABLET(25 MG) BY MOUTH TWICE DAILY   cetirizine (ZYRTEC) 10 MG tablet Take 1 tablet (10 mg total) by mouth daily.   clobetasol (TEMOVATE) 0.05 % external solution Apply 1 application. topically 2 (two) times daily as needed. Apply to scalp   cyclobenzaprine (FLEXERIL) 10 MG tablet Take 1 tablet (10 mg total) by mouth 3 (three) times daily as needed.    Dapsone 7.5 % GEL Apply 1 application topically daily.   diclofenac Sodium (VOLTAREN) 1 % GEL Apply 2 g topically 4 (four) times daily.   ferrous sulfate (FERROUSUL) 325 (65 FE) MG tablet Take 1 tablet (325 mg total) by mouth daily with breakfast.   fluconazole (DIFLUCAN) 150 MG tablet Take one tab a day for 3 days   fluticasone (FLONASE) 50 MCG/ACT nasal spray Place 1 spray into both nostrils at bedtime.   gabapentin (NEURONTIN) 300 MG capsule TAKE 1 CAPSULE(300 MG) BY MOUTH THREE TIMES DAILY   hydrochlorothiazide (HYDRODIURIL) 12.5 MG tablet TAKE 1 TABLET(12.5 MG) BY MOUTH DAILY   hydroxychloroquine (PLAQUENIL) 200 MG tablet Take 1 tablet (200 mg total) by mouth daily.   ketoconazole (NIZORAL) 2 % shampoo APPLY 1 APPLICATION TOPICALLY EVERY 3 DAYS. LET SIT 5 MINUTES BEFORE RINSING OUT   lisinopril (ZESTRIL) 5 MG tablet TAKE 1 TABLET(5 MG) BY MOUTH DAILY   meloxicam (MOBIC) 7.5 MG tablet Take 1 tablet (7.5 mg total) by mouth 2 (two) times daily.   mometasone (ELOCON) 0.1 % ointment APPLY TO LUPUS SORES ON FACE TOPICALLY DAILY AS NEEDED FOR FLARES   montelukast (SINGULAIR) 10 MG tablet Take 1 tablet (10 mg total) by mouth at bedtime.   mupirocin ointment (BACTROBAN) 2 % Apply to open area on skin qd-bid   Norethindrone Acetate-Ethinyl Estradiol (JUNEL 1.5/30) 1.5-30 MG-MCG tablet Take 1 tablet by mouth daily.   nystatin (MYCOSTATIN) 100000 UNIT/ML suspension Take 5 mLs (500,000 Units total)  by mouth 4 (four) times daily.   oxyCODONE-acetaminophen (PERCOCET) 7.5-325 MG tablet Take 1 tablet by mouth every 4 (four) hours as needed for severe pain.   Polyethyl Glycol-Propyl Glycol (SYSTANE ULTRA) 0.4-0.3 % SOLN Place 1 drop into both eyes daily as needed (for dry eyes).    PULMICORT FLEXHALER 90 MCG/ACT inhaler INHALE 2 PUFFS INTO THE LUNGS TWICE DAILY   tacrolimus (PROTOPIC) 0.1 % ointment APPLY TOPICALLY TO THE AFFECTED AREA DAILY   No facility-administered encounter medications on file as of  04/16/2022.    Surgical History: Past Surgical History:  Procedure Laterality Date   carpel tunnel  Left 2014   CRANIOTOMY  14,06   chiari   DENTAL SURGERY     HERNIA REPAIR     umbilical   PLACEMENT OF LUMBAR DRAIN N/A 07/07/2014   Procedure: PLACEMENT OF LUMBAR DRAIN AND CLOSURE OF CERVICAL INCISION;  Surgeon: Newman Pies, MD;  Location: [place] [facility];  Service: Neurosurgery;  Laterality: N/A;   SUBOCCIPITAL CRANIECTOMY CERVICAL LAMINECTOMY N/A 09/29/2013   Procedure: SUBOCCIPITAL CRANIECTOMY CERVICAL LAMINECTOMY/DURAPLASTY;  Surgeon: Ophelia Charter, MD;  Location: [place] [facility];  Service: Neurosurgery;  Laterality: N/A;  posterior   SUBOCCIPITAL CRANIECTOMY CERVICAL LAMINECTOMY N/A 06/23/2014   Procedure: SUBOCCIPITAL CRANIECTOMY CERVICAL LAMINECTOMY/DURAPLASTY;  Surgeon: Newman Pies, MD;  Location: [place] [facility];  Service: Neurosurgery;  Laterality: N/A;  suboccipital craniectomy with cervical laminectomy and duraplasty    Medical History: Past Medical History:  Diagnosis Date   Anxiety    Arthritis    rheumo possible   Chiari malformation    Depression    Dizziness    Headache(784.0)    History of kidney stones    Hypertension    Lupus (HCC)    Lupus (HCC)    Nerve damage    NECK   PONV (postoperative nausea and vomiting)    nausea only   Sleep apnea    TMJ arthritis     Family History: Family History  Problem Relation Age of Onset   Heart disease Father    Hypertension Father    Hypertension Mother    Thyroid disease Mother    Asthma Other    Depression Other    Diabetes Other    Migraines Other    Stroke Other     Social History   Socioeconomic History   Marital status: Single    Spouse name: Not on file   Number of children: Not on file   Years of education: Not on file   Highest education level: Not on file  Occupational History   Not on file  Tobacco Use   Smoking status: Never   Smokeless tobacco: Never  Vaping Use   Vaping Use:  Never used  Substance and Sexual Activity   Alcohol use: No    Alcohol/week: 0.0 standard drinks of alcohol   Drug use: No   Sexual activity: Not on file  Other Topics Concern   Not on file  Social History Narrative   Lives with 3 children in 2 story home.  Unemployed.  Trying to get disability.  Education: high school.   Social Determinants of Health   Financial Resource Strain: Not on file  Food Insecurity: Not on file  Transportation Needs: Not on file  Physical Activity: Not on file  Stress: Not on file  Social Connections: Not on file  Intimate Partner Violence: Not on file      Review of Systems  Constitutional:  Negative for fatigue and  fever.  HENT:  Negative for congestion, mouth sores and postnasal drip.   Respiratory:  Positive for cough and shortness of breath.   Cardiovascular:  Negative for chest pain.  Genitourinary:  Negative for flank pain.  Psychiatric/Behavioral: Negative.      Vital Signs: Temp (!) 97.2 F (36.2 C)   Resp 16   Ht '5\' 6"'$  (1.676 m)   BMI 37.74 kg/m    Observation/Objective: Looks uncomfortable, coughing, saliva      Assessment/Plan: 1. Moderate persistent asthma with acute exacerbation Will start on ICS, as asthma is not well controlled  - Budeson-Glycopyrrol-Formoterol (BREZTRI AEROSPHERE) 160-9-4.8 MCG/ACT AERO; Inhale 2 puffs into the lungs 2 (two) times daily.  Dispense: 10.7 g; Refill: 11  2. Acute asthmatic bronchitis - azithromycin (ZITHROMAX) 250 MG tablet; Take one tab a day for 10 days for uri  Dispense: 10 tablet; Refill: 0  3. Yeast infection of the vagina Prophylaxis therapy - fluconazole (DIFLUCAN) 150 MG tablet; Take one tab a day for 3 days  Dispense: 3 tablet; Refill: 0   General Counseling: Morgan Kent verbalizes understanding of the findings of today's phone visit and agrees with plan of treatment. I have discussed any further diagnostic evaluation that may be needed or ordered today. We also reviewed her  medications today. she has been encouraged to call the office with any questions or concerns that should arise related to todays visit.    No orders of the defined types were placed in this encounter.   Meds ordered this encounter  Medications   azithromycin (ZITHROMAX) 250 MG tablet    Sig: Take one tab a day for 10 days for uri    Dispense:  10 tablet    Refill:  0   Budeson-Glycopyrrol-Formoterol (BREZTRI AEROSPHERE) 160-9-4.8 MCG/ACT AERO    Sig: Inhale 2 puffs into the lungs 2 (two) times daily.    Dispense:  10.7 g    Refill:  11   fluconazole (DIFLUCAN) 150 MG tablet    Sig: Take one tab a day for 3 days    Dispense:  3 tablet    Refill:  0    Time spent:25 Minutes    Dr Lavera Guise Internal medicine

## 2022-04-18 ENCOUNTER — Other Ambulatory Visit: Payer: Self-pay | Admitting: Dermatology

## 2022-04-18 DIAGNOSIS — B370 Candidal stomatitis: Secondary | ICD-10-CM

## 2022-05-07 DIAGNOSIS — Z79891 Long term (current) use of opiate analgesic: Secondary | ICD-10-CM | POA: Diagnosis not present

## 2022-05-07 DIAGNOSIS — G894 Chronic pain syndrome: Secondary | ICD-10-CM | POA: Diagnosis not present

## 2022-05-22 DIAGNOSIS — M47816 Spondylosis without myelopathy or radiculopathy, lumbar region: Secondary | ICD-10-CM | POA: Diagnosis not present

## 2022-05-22 DIAGNOSIS — M5136 Other intervertebral disc degeneration, lumbar region: Secondary | ICD-10-CM | POA: Diagnosis not present

## 2022-05-28 ENCOUNTER — Ambulatory Visit (INDEPENDENT_AMBULATORY_CARE_PROVIDER_SITE_OTHER): Payer: Medicare Other | Admitting: Internal Medicine

## 2022-05-28 ENCOUNTER — Encounter: Payer: Self-pay | Admitting: Internal Medicine

## 2022-05-28 ENCOUNTER — Telehealth: Payer: Self-pay

## 2022-05-28 VITALS — BP 132/81 | HR 96 | Temp 98.0°F | Resp 16 | Ht 66.0 in | Wt 223.6 lb

## 2022-05-28 DIAGNOSIS — G4733 Obstructive sleep apnea (adult) (pediatric): Secondary | ICD-10-CM

## 2022-05-28 DIAGNOSIS — Q0700 Arnold-Chiari syndrome without spina bifida or hydrocephalus: Secondary | ICD-10-CM | POA: Diagnosis not present

## 2022-05-28 DIAGNOSIS — J4541 Moderate persistent asthma with (acute) exacerbation: Secondary | ICD-10-CM | POA: Diagnosis not present

## 2022-05-28 DIAGNOSIS — M3219 Other organ or system involvement in systemic lupus erythematosus: Secondary | ICD-10-CM

## 2022-05-28 DIAGNOSIS — R6884 Jaw pain: Secondary | ICD-10-CM

## 2022-05-28 DIAGNOSIS — Z6836 Body mass index (BMI) 36.0-36.9, adult: Secondary | ICD-10-CM | POA: Diagnosis not present

## 2022-05-28 DIAGNOSIS — Z9989 Dependence on other enabling machines and devices: Secondary | ICD-10-CM | POA: Diagnosis not present

## 2022-05-28 NOTE — Progress Notes (Signed)
[facility] [place], [place] [address]  Internal MEDICINE  Office Visit Note  Patient Name: Morgan Kent  [id number]  [id number]  Date of Service: 05/30/2022  Chief Complaint  Patient presents with   Follow-up   Depression   Asthma   Facial Pain    Left side facial pain/pressure - started 1 week ago   Foot Pain    Left heel pain    HPI Pt is here for f/u after her acute visit She was diagnosed to have asthma and was treated appropriately ICS combo Breztri, she feels better  She does have Lupus and can have risk of lung involvement, she is on MTX  She is c/o left sided neck and jaw pain, will see her Rheumatologist    Pt is on multiple medications and will need med consult  Depression and BP is well controlled  Struggles with wt loss     Current Medication: Outpatient Encounter Medications as of 05/28/2022  Medication Sig   albuterol (VENTOLIN HFA) 108 (90 Base) MCG/ACT inhaler Inhale 2 puffs into the lungs daily.   amLODipine (NORVASC) 5 MG tablet TAKE 1 TABLET(5 MG) BY MOUTH DAILY   Budeson-Glycopyrrol-Formoterol (BREZTRI AEROSPHERE) 160-9-4.8 MCG/ACT AERO Inhale 2 puffs into the lungs 2 (two) times daily.   carvedilol (COREG) 25 MG tablet TAKE 1 TABLET(25 MG) BY MOUTH TWICE DAILY   cetirizine (ZYRTEC) 10 MG tablet Take 1 tablet (10 mg total) by mouth daily.   clobetasol (TEMOVATE) 0.05 % external solution Apply 1 application. topically 2 (two) times daily as needed. Apply to scalp   cyclobenzaprine (FLEXERIL) 10 MG tablet Take 1 tablet (10 mg total) by mouth 3 (three) times daily as needed.   Dapsone 7.5 % GEL Apply 1 application topically daily.   diclofenac Sodium (VOLTAREN) 1 % GEL Apply 2 g topically 4 (four) times daily.   ferrous sulfate (FERROUSUL) 325 (65 FE) MG tablet Take 1 tablet (325 mg total) by mouth daily with breakfast.   fluconazole (DIFLUCAN) 150 MG tablet Take one tab a day for 3 days   fluticasone (FLONASE) 50 MCG/ACT nasal  spray Place 1 spray into both nostrils at bedtime.   folic acid (FOLVITE) 1 MG tablet Take 1 mg by mouth daily.   gabapentin (NEURONTIN) 300 MG capsule TAKE 1 CAPSULE(300 MG) BY MOUTH THREE TIMES DAILY   hydrochlorothiazide (HYDRODIURIL) 12.5 MG tablet TAKE 1 TABLET(12.5 MG) BY MOUTH DAILY   hydroxychloroquine (PLAQUENIL) 200 MG tablet Take 1 tablet (200 mg total) by mouth daily.   ketoconazole (NIZORAL) 2 % shampoo APPLY 1 APPLICATION TOPICALLY EVERY 3 DAYS. LET SIT 5 MINUTES BEFORE RINSING OUT   lisinopril (ZESTRIL) 5 MG tablet TAKE 1 TABLET(5 MG) BY MOUTH DAILY   meloxicam (MOBIC) 7.5 MG tablet Take 1 tablet (7.5 mg total) by mouth 2 (two) times daily.   methotrexate (RHEUMATREX) 2.5 MG tablet Take 2.5 mg by mouth once a week. Caution:Chemotherapy. Protect from light.   mometasone (ELOCON) 0.1 % ointment APPLY TO LUPUS SORES ON FACE TOPICALLY DAILY AS NEEDED FOR FLARES   montelukast (SINGULAIR) 10 MG tablet Take 1 tablet (10 mg total) by mouth at bedtime.   mupirocin ointment (BACTROBAN) 2 % Apply to open area on skin qd-bid   Norethindrone Acetate-Ethinyl Estradiol (JUNEL 1.5/30) 1.5-30 MG-MCG tablet Take 1 tablet by mouth daily.   norethindrone-ethinyl estradiol-iron (LOESTRIN FE) 1.5-30 MG-MCG tablet Take 1 tablet by mouth daily.   nystatin (MYCOSTATIN) 100000 UNIT/ML suspension Take 5 mLs (500,000  Units total) by mouth 4 (four) times daily.   oxyCODONE-acetaminophen (PERCOCET) 7.5-325 MG tablet Take 1 tablet by mouth every 4 (four) hours as needed for severe pain.   Polyethyl Glycol-Propyl Glycol (SYSTANE ULTRA) 0.4-0.3 % SOLN Place 1 drop into both eyes daily as needed (for dry eyes).    PULMICORT FLEXHALER 90 MCG/ACT inhaler INHALE 2 PUFFS INTO THE LUNGS TWICE DAILY   tacrolimus (PROTOPIC) 0.1 % ointment APPLY TOPICALLY TO THE AFFECTED AREA DAILY   [DISCONTINUED] azithromycin (ZITHROMAX) 250 MG tablet Take one tab a day for 10 days for uri   No facility-administered encounter  medications on file as of 05/28/2022.    Surgical History: Past Surgical History:  Procedure Laterality Date   carpel tunnel  Left 2014   CRANIOTOMY  14,06   chiari   DENTAL SURGERY     HERNIA REPAIR     umbilical   PLACEMENT OF LUMBAR DRAIN N/A 07/07/2014   Procedure: PLACEMENT OF LUMBAR DRAIN AND CLOSURE OF CERVICAL INCISION;  Surgeon: Newman Pies, MD;  Location: [place] [facility];  Service: Neurosurgery;  Laterality: N/A;   SUBOCCIPITAL CRANIECTOMY CERVICAL LAMINECTOMY N/A 09/29/2013   Procedure: SUBOCCIPITAL CRANIECTOMY CERVICAL LAMINECTOMY/DURAPLASTY;  Surgeon: Ophelia Charter, MD;  Location: [place] [facility];  Service: Neurosurgery;  Laterality: N/A;  posterior   SUBOCCIPITAL CRANIECTOMY CERVICAL LAMINECTOMY N/A 06/23/2014   Procedure: SUBOCCIPITAL CRANIECTOMY CERVICAL LAMINECTOMY/DURAPLASTY;  Surgeon: Newman Pies, MD;  Location: [place] [facility];  Service: Neurosurgery;  Laterality: N/A;  suboccipital craniectomy with cervical laminectomy and duraplasty    Medical History: Past Medical History:  Diagnosis Date   Anxiety    Arthritis    rheumo possible   Chiari malformation    Depression    Dizziness    Headache(784.0)    History of kidney stones    Hypertension    Lupus (HCC)    Lupus (HCC)    Nerve damage    NECK   PONV (postoperative nausea and vomiting)    nausea only   Sleep apnea    TMJ arthritis     Family History: Family History  Problem Relation Age of Onset   Heart disease Father    Hypertension Father    Hypertension Mother    Thyroid disease Mother    Asthma Other    Depression Other    Diabetes Other    Migraines Other    Stroke Other     Social History   Socioeconomic History   Marital status: Single    Spouse name: Not on file   Number of children: Not on file   Years of education: Not on file   Highest education level: Not on file  Occupational History   Not on file  Tobacco Use   Smoking status: Never   Smokeless tobacco: Never   Vaping Use   Vaping Use: Never used  Substance and Sexual Activity   Alcohol use: No    Alcohol/week: 0.0 standard drinks of alcohol   Drug use: No   Sexual activity: Not on file  Other Topics Concern   Not on file  Social History Narrative   Lives with 3 children in 2 story home.  Unemployed.  Trying to get disability.  Education: high school.   Social Determinants of Health   Financial Resource Strain: Not on file  Food Insecurity: Not on file  Transportation Needs: Not on file  Physical Activity: Not on file  Stress: Not on file  Social Connections: Not on file  Intimate  Partner Violence: Not on file      Review of Systems  Constitutional:  Negative for fatigue and fever.  HENT:  Negative for congestion, mouth sores and postnasal drip.   Respiratory:  Positive for shortness of breath. Negative for cough.   Cardiovascular:  Negative for chest pain.  Genitourinary:  Negative for flank pain.  Musculoskeletal:  Positive for neck pain.  Psychiatric/Behavioral: Negative.      Vital Signs: BP 132/81   Pulse 96   Temp 98 F (36.7 C)   Resp 16   Ht '5\' 6"'$  (1.676 m)   Wt 223 lb 9.6 oz (101.4 kg)   SpO2 99%   BMI 36.09 kg/m    Physical Exam Constitutional:      Appearance: Normal appearance.  HENT:     Head: Normocephalic and atraumatic.     Nose: Nose normal.     Mouth/Throat:     Mouth: Mucous membranes are moist.     Pharynx: No posterior oropharyngeal erythema.  Eyes:     Extraocular Movements: Extraocular movements intact.     Pupils: Pupils are equal, round, and reactive to light.  Cardiovascular:     Pulses: Normal pulses.     Heart sounds: Normal heart sounds.  Pulmonary:     Effort: Pulmonary effort is normal.     Breath sounds: Normal breath sounds.  Neurological:     General: No focal deficit present.     Mental Status: She is alert.  Psychiatric:        Mood and Affect: Mood normal.        Behavior: Behavior normal.         Assessment/Plan: 1. Moderate persistent asthma with acute exacerbation Pt is stable today, will need to look further about lupus involvement, pt needs to see pulmonary for further recommendations  - Pulmonary Function Test; Future - DG Chest 2 View; Future - Ambulatory referral to Pulmonology  2. OSA on CPAP Will continue on CPAP  3. Pain in bone of jaw Referred to Rheumatology  4. BMI 36.0-36.9,adult Obesity Counseling: Risk Assessment: An assessment of behavioral risk factors was made today and includes lack of exercise sedentary lifestyle, lack of portion control and poor dietary habits.  Risk Modification Advice: She was counseled on portion control guidelines. Restricting daily caloric intake to 1500. The detrimental long term effects of obesity on her health and ongoing poor compliance was also discussed with the patient.   5. Arnold-Chiari syndrome ([place]) Followed by Neurology   6. Other systemic lupus erythematosus with other organ involvement ([place]) Followed by Rheumatology   General Counseling: Makynlie verbalizes understanding of the findings of todays visit and agrees with plan of treatment. I have discussed any further diagnostic evaluation that may be needed or ordered today. We also reviewed her medications today. she has been encouraged to call the office with any questions or concerns that should arise related to todays visit.    Orders Placed This Encounter  Procedures   DG Chest 2 View   Ambulatory referral to Pulmonology   Pulmonary Function Test    No orders of the defined types were placed in this encounter.   Total time spent:35 Minutes Time spent includes review of chart, medications, test results, and follow up plan with the patient.   [place] Controlled Substance Database was reviewed by me.   Dr Lavera Guise Internal medicine

## 2022-05-28 NOTE — Telephone Encounter (Signed)
Spoke with kelly she will call pt today they did find her in system and she is using [facility] for cpap

## 2022-05-29 ENCOUNTER — Telehealth: Payer: Self-pay

## 2022-05-29 NOTE — Telephone Encounter (Signed)
Error

## 2022-06-03 ENCOUNTER — Ambulatory Visit
Admission: RE | Admit: 2022-06-03 | Discharge: 2022-06-03 | Disposition: A | Discharge status: Home / Self Care | Payer: Medicare Other | Attending: Internal Medicine | Admitting: Internal Medicine

## 2022-06-03 ENCOUNTER — Ambulatory Visit
Admission: RE | Admit: 2022-06-03 | Discharge: 2022-06-03 | Disposition: A | Discharge status: Home / Self Care | Payer: Medicare Other | Source: Ambulatory Visit | Attending: Internal Medicine | Admitting: Internal Medicine

## 2022-06-03 DIAGNOSIS — J4541 Moderate persistent asthma with (acute) exacerbation: Secondary | ICD-10-CM | POA: Insufficient documentation

## 2022-06-03 DIAGNOSIS — R059 Cough, unspecified: Secondary | ICD-10-CM | POA: Diagnosis not present

## 2022-06-03 DIAGNOSIS — J45909 Unspecified asthma, uncomplicated: Secondary | ICD-10-CM | POA: Diagnosis not present

## 2022-06-03 DIAGNOSIS — R0602 Shortness of breath: Secondary | ICD-10-CM | POA: Diagnosis not present

## 2022-06-04 DIAGNOSIS — M25511 Pain in right shoulder: Secondary | ICD-10-CM | POA: Diagnosis not present

## 2022-06-04 DIAGNOSIS — M5136 Other intervertebral disc degeneration, lumbar region: Secondary | ICD-10-CM | POA: Diagnosis not present

## 2022-06-04 DIAGNOSIS — M25512 Pain in left shoulder: Secondary | ICD-10-CM | POA: Diagnosis not present

## 2022-06-04 DIAGNOSIS — M545 Low back pain, unspecified: Secondary | ICD-10-CM | POA: Diagnosis not present

## 2022-06-04 DIAGNOSIS — M542 Cervicalgia: Secondary | ICD-10-CM | POA: Diagnosis not present

## 2022-06-04 DIAGNOSIS — G629 Polyneuropathy, unspecified: Secondary | ICD-10-CM | POA: Diagnosis not present

## 2022-06-04 DIAGNOSIS — M5412 Radiculopathy, cervical region: Secondary | ICD-10-CM | POA: Diagnosis not present

## 2022-06-04 DIAGNOSIS — G894 Chronic pain syndrome: Secondary | ICD-10-CM | POA: Diagnosis not present

## 2022-06-04 DIAGNOSIS — M79661 Pain in right lower leg: Secondary | ICD-10-CM | POA: Diagnosis not present

## 2022-06-04 DIAGNOSIS — M79662 Pain in left lower leg: Secondary | ICD-10-CM | POA: Diagnosis not present

## 2022-06-04 DIAGNOSIS — Z713 Dietary counseling and surveillance: Secondary | ICD-10-CM | POA: Diagnosis not present

## 2022-06-04 DIAGNOSIS — Z79891 Long term (current) use of opiate analgesic: Secondary | ICD-10-CM | POA: Diagnosis not present

## 2022-06-04 DIAGNOSIS — Z724 Inappropriate diet and eating habits: Secondary | ICD-10-CM | POA: Diagnosis not present

## 2022-06-04 NOTE — Progress Notes (Signed)
Cxr normal

## 2022-06-05 ENCOUNTER — Telehealth: Payer: Self-pay

## 2022-06-05 ENCOUNTER — Ambulatory Visit (INDEPENDENT_AMBULATORY_CARE_PROVIDER_SITE_OTHER): Payer: Medicare Other | Admitting: Internal Medicine

## 2022-06-05 DIAGNOSIS — J4541 Moderate persistent asthma with (acute) exacerbation: Secondary | ICD-10-CM

## 2022-06-05 NOTE — Telephone Encounter (Signed)
-----   Message from Edd Arbour, [place] sent at 06/05/2022 11:07 AM EDT -----  ----- Message ----- From: Lavera Guise, MD Sent: 06/04/2022   8:58 AM EDT To: Edd Arbour, CMA  Cxr normal

## 2022-06-05 NOTE — Telephone Encounter (Signed)
Spoke with patient regarding chest x ray results.

## 2022-06-08 ENCOUNTER — Other Ambulatory Visit: Payer: Self-pay | Admitting: Physician Assistant

## 2022-06-08 ENCOUNTER — Other Ambulatory Visit: Payer: Self-pay | Admitting: Cardiovascular Disease

## 2022-06-08 DIAGNOSIS — Z3041 Encounter for surveillance of contraceptive pills: Secondary | ICD-10-CM

## 2022-06-09 ENCOUNTER — Other Ambulatory Visit: Payer: Self-pay | Admitting: Physician Assistant

## 2022-06-09 DIAGNOSIS — M792 Neuralgia and neuritis, unspecified: Secondary | ICD-10-CM

## 2022-06-09 DIAGNOSIS — Z3041 Encounter for surveillance of contraceptive pills: Secondary | ICD-10-CM

## 2022-06-10 ENCOUNTER — Other Ambulatory Visit: Payer: Self-pay

## 2022-06-10 DIAGNOSIS — J4520 Mild intermittent asthma, uncomplicated: Secondary | ICD-10-CM

## 2022-06-10 DIAGNOSIS — D509 Iron deficiency anemia, unspecified: Secondary | ICD-10-CM

## 2022-06-10 MED ORDER — ALBUTEROL SULFATE HFA 108 (90 BASE) MCG/ACT IN AERS: 2.0000 | INHALATION_SPRAY | Freq: Every day | RESPIRATORY_TRACT | 5 refills | Status: DC

## 2022-06-10 MED ORDER — FERROUS SULFATE 325 (65 FE) MG PO TABS: 325.0000 mg | ORAL_TABLET | Freq: Every day | ORAL | 1 refills | Status: DC

## 2022-06-12 ENCOUNTER — Other Ambulatory Visit: Payer: Self-pay | Admitting: Physician Assistant

## 2022-06-12 ENCOUNTER — Ambulatory Visit (INDEPENDENT_AMBULATORY_CARE_PROVIDER_SITE_OTHER): Payer: Medicare Other

## 2022-06-12 DIAGNOSIS — G4733 Obstructive sleep apnea (adult) (pediatric): Secondary | ICD-10-CM | POA: Diagnosis not present

## 2022-06-12 DIAGNOSIS — Z3041 Encounter for surveillance of contraceptive pills: Secondary | ICD-10-CM

## 2022-06-12 DIAGNOSIS — E538 Deficiency of other specified B group vitamins: Secondary | ICD-10-CM | POA: Diagnosis not present

## 2022-06-12 DIAGNOSIS — R5383 Other fatigue: Secondary | ICD-10-CM | POA: Diagnosis not present

## 2022-06-12 DIAGNOSIS — E559 Vitamin D deficiency, unspecified: Secondary | ICD-10-CM | POA: Diagnosis not present

## 2022-06-12 DIAGNOSIS — R7989 Other specified abnormal findings of blood chemistry: Secondary | ICD-10-CM | POA: Diagnosis not present

## 2022-06-12 DIAGNOSIS — E782 Mixed hyperlipidemia: Secondary | ICD-10-CM | POA: Diagnosis not present

## 2022-06-12 NOTE — Progress Notes (Signed)
95 percentile pressure 5.2   95th percentile leak 2.2   apnea index 0.1 /hr  apnea-hypopnea index  0.2 /hr   total days used  >4 hr 89 days  total days used <4 hr 82 days  Total compliance 92 percent  She is doing great I will work on getting her new supplies. No problems or questions at this time.   Pt was seen by Claiborne Billings  RRT/RCP  from [facility]

## 2022-06-13 ENCOUNTER — Encounter: Payer: Self-pay | Admitting: Physician Assistant

## 2022-06-13 ENCOUNTER — Ambulatory Visit (INDEPENDENT_AMBULATORY_CARE_PROVIDER_SITE_OTHER): Payer: Medicare Other | Admitting: Physician Assistant

## 2022-06-13 VITALS — BP 128/76 | HR 98 | Temp 98.2°F | Resp 16 | Ht 66.0 in | Wt 227.0 lb

## 2022-06-13 DIAGNOSIS — Z1231 Encounter for screening mammogram for malignant neoplasm of breast: Secondary | ICD-10-CM | POA: Diagnosis not present

## 2022-06-13 DIAGNOSIS — M329 Systemic lupus erythematosus, unspecified: Secondary | ICD-10-CM

## 2022-06-13 DIAGNOSIS — I10 Essential (primary) hypertension: Secondary | ICD-10-CM

## 2022-06-13 DIAGNOSIS — M064 Inflammatory polyarthropathy: Secondary | ICD-10-CM | POA: Diagnosis not present

## 2022-06-13 DIAGNOSIS — J4520 Mild intermittent asthma, uncomplicated: Secondary | ICD-10-CM

## 2022-06-13 DIAGNOSIS — E538 Deficiency of other specified B group vitamins: Secondary | ICD-10-CM | POA: Diagnosis not present

## 2022-06-13 DIAGNOSIS — E559 Vitamin D deficiency, unspecified: Secondary | ICD-10-CM

## 2022-06-13 DIAGNOSIS — B370 Candidal stomatitis: Secondary | ICD-10-CM | POA: Diagnosis not present

## 2022-06-13 DIAGNOSIS — Z23 Encounter for immunization: Secondary | ICD-10-CM

## 2022-06-13 DIAGNOSIS — Z0001 Encounter for general adult medical examination with abnormal findings: Secondary | ICD-10-CM

## 2022-06-13 DIAGNOSIS — R300 Dysuria: Secondary | ICD-10-CM

## 2022-06-13 LAB — COMPREHENSIVE METABOLIC PANEL
ALT: 8 IU/L (ref 0–32)
AST: 17 IU/L (ref 0–40)
Albumin/Globulin Ratio: 1.1 — ABNORMAL LOW (ref 1.2–2.2)
Albumin: 4.2 g/dL (ref 3.9–4.9)
Alkaline Phosphatase: 53 IU/L (ref 44–121)
BUN/Creatinine Ratio: 7 — ABNORMAL LOW (ref 9–23)
BUN: 6 mg/dL (ref 6–24)
Bilirubin Total: 0.3 mg/dL (ref 0.0–1.2)
CO2: 23 mmol/L (ref 20–29)
Calcium: 9.6 mg/dL (ref 8.7–10.2)
Chloride: 97 mmol/L (ref 96–106)
Creatinine, Ser: 0.91 mg/dL (ref 0.57–1.00)
Globulin, Total: 3.9 g/dL (ref 1.5–4.5)
Glucose: 66 mg/dL — ABNORMAL LOW (ref 70–99)
Potassium: 4.5 mmol/L (ref 3.5–5.2)
Sodium: 136 mmol/L (ref 134–144)
Total Protein: 8.1 g/dL (ref 6.0–8.5)
eGFR: 81 mL/min/{1.73_m2} (ref >59)

## 2022-06-13 LAB — IRON,TIBC AND FERRITIN PANEL
Ferritin: 263 ng/mL — ABNORMAL HIGH (ref 15–150)
Iron Saturation: 20 % (ref 15–55)
Iron: 66 ug/dL (ref 27–159)
Total Iron Binding Capacity: 335 ug/dL (ref 250–450)
UIBC: 269 ug/dL (ref 131–425)

## 2022-06-13 LAB — LIPID PANEL WITH LDL/HDL RATIO
Cholesterol, Total: 167 mg/dL (ref 100–199)
HDL: 34 mg/dL — ABNORMAL LOW (ref >39)
LDL Chol Calc (NIH): 87 mg/dL (ref 0–99)
LDL/HDL Ratio: 2.6 ratio (ref 0.0–3.2)
Triglycerides: 274 mg/dL — ABNORMAL HIGH (ref 0–149)
VLDL Cholesterol Cal: 46 mg/dL — ABNORMAL HIGH (ref 5–40)

## 2022-06-13 LAB — B12 AND FOLATE PANEL
Folate: >20 ng/mL (ref >3.0)
Vitamin B-12: 334 pg/mL (ref 232–1245)

## 2022-06-13 LAB — TSH+FREE T4
Free T4: 1.43 ng/dL (ref 0.82–1.77)
TSH: 1.29 u[IU]/mL (ref 0.450–4.500)

## 2022-06-13 LAB — VITAMIN D 25 HYDROXY (VIT D DEFICIENCY, FRACTURES): Vit D, 25-Hydroxy: 19.5 ng/mL — ABNORMAL LOW (ref 30.0–100.0)

## 2022-06-13 MED ORDER — CYANOCOBALAMIN 1000 MCG/ML IJ SOLN
1000.0000 ug | Freq: Once | INTRAMUSCULAR | Status: AC
  Administered 2022-06-13: 1000 ug via INTRAMUSCULAR

## 2022-06-13 MED ORDER — MELOXICAM 7.5 MG PO TABS
7.5000 mg | ORAL_TABLET | Freq: Two times a day (BID) | ORAL | 1 refills | Status: DC
Start: 2022-06-13 — End: 2022-10-18

## 2022-06-13 MED ORDER — ERGOCALCIFEROL 1.25 MG (50000 UT) PO CAPS: ORAL_CAPSULE | ORAL | 3 refills | Status: DC

## 2022-06-13 MED ORDER — DICLOFENAC SODIUM 1 % EX GEL: 2.0000 g | Freq: Four times a day (QID) | CUTANEOUS | 2 refills | Status: DC

## 2022-06-13 MED ORDER — MONTELUKAST SODIUM 10 MG PO TABS: 10.0000 mg | ORAL_TABLET | Freq: Every day | ORAL | 3 refills | Status: DC

## 2022-06-13 MED ORDER — FLUCONAZOLE 150 MG PO TABS
ORAL_TABLET | ORAL | 0 refills | Status: DC
Start: 2022-06-13 — End: 2022-08-19

## 2022-06-13 MED ORDER — TETANUS-DIPHTH-ACELL PERTUSSIS 5-2.5-18.5 LF-MCG/0.5 IM SUSP: 0.5000 mL | Freq: Once | INTRAMUSCULAR | 0 refills | Status: AC

## 2022-06-13 MED ORDER — NYSTATIN 100000 UNIT/ML MT SUSP: 5.0000 mL | Freq: Four times a day (QID) | OROMUCOSAL | 2 refills | Status: DC

## 2022-06-13 NOTE — Progress Notes (Signed)
[facility] [place], [place] [address]  Internal MEDICINE  Office Visit Note  Patient Name: Morgan Kent  [id number]  [id number]  Date of Service: 06/13/2022  Chief Complaint  Patient presents with   Medicare Wellness   Hypertension   Depression   Anxiety     HPI Pt is here for routine health maintenance examination -Needs some meds refilled -Labs reviewed showing low vitamin D and b12 still low, never completed b12 injections and will start back with this  -Goes back to rheumatology by end of the month -thrush in mouth again, doesn't always swish after inhalers and will do better with this. -Does have paper for enrolling in [facility] weight loss program that she would like signed off today. Reports her daughter's provider did this for her daughter and she would like to try to do it as well. She reports no recent cardiac problems and was told by cardiology at last visit that she didn't need to be seen regularly since she was doing so well. Therefore will sign off on starting program with patient restricting herself by her own physical abilities and will have her monitor BP there as well. -Due for tdap  Current Medication: Outpatient Encounter Medications as of 06/13/2022  Medication Sig   albuterol (VENTOLIN HFA) 108 (90 Base) MCG/ACT inhaler Inhale 2 puffs into the lungs daily.   amLODipine (NORVASC) 5 MG tablet TAKE 1 TABLET(5 MG) BY MOUTH DAILY   Budeson-Glycopyrrol-Formoterol (BREZTRI AEROSPHERE) 160-9-4.8 MCG/ACT AERO Inhale 2 puffs into the lungs 2 (two) times daily.   carvedilol (COREG) 25 MG tablet TAKE 1 TABLET(25 MG) BY MOUTH TWICE DAILY   cetirizine (ZYRTEC) 10 MG tablet Take 1 tablet (10 mg total) by mouth daily.   clobetasol (TEMOVATE) 0.05 % external solution Apply 1 application. topically 2 (two) times daily as needed. Apply to scalp   cyclobenzaprine (FLEXERIL) 10 MG tablet Take 1 tablet (10 mg total) by mouth 3 (three) times daily as needed.    Dapsone 7.5 % GEL Apply 1 application topically daily.   ergocalciferol (DRISDOL) 1.25 MG (50000 UT) capsule Take one cap q week   ferrous sulfate (FERROUSUL) 325 (65 FE) MG tablet Take 1 tablet (325 mg total) by mouth daily with breakfast.   fluticasone (FLONASE) 50 MCG/ACT nasal spray Place 1 spray into both nostrils at bedtime.   folic acid (FOLVITE) 1 MG tablet Take 1 mg by mouth daily.   gabapentin (NEURONTIN) 300 MG capsule TAKE 1 CAPSULE(300 MG) BY MOUTH THREE TIMES DAILY   hydrochlorothiazide (HYDRODIURIL) 12.5 MG tablet TAKE 1 TABLET(12.5 MG) BY MOUTH DAILY   hydroxychloroquine (PLAQUENIL) 200 MG tablet Take 1 tablet (200 mg total) by mouth daily.   JUNEL 1.5/30 1.5-30 MG-MCG tablet TAKE 1 TABLET BY MOUTH DAILY   ketoconazole (NIZORAL) 2 % shampoo APPLY 1 APPLICATION TOPICALLY EVERY 3 DAYS. LET SIT 5 MINUTES BEFORE RINSING OUT   lisinopril (ZESTRIL) 5 MG tablet TAKE 1 TABLET(5 MG) BY MOUTH DAILY   methotrexate (RHEUMATREX) 2.5 MG tablet Take 2.5 mg by mouth once a week. Caution:Chemotherapy. Protect from light.   mometasone (ELOCON) 0.1 % ointment APPLY TO LUPUS SORES ON FACE TOPICALLY DAILY AS NEEDED FOR FLARES   mupirocin ointment (BACTROBAN) 2 % Apply to open area on skin qd-bid   NON FORMULARY CPAP with AHP   oxyCODONE-acetaminophen (PERCOCET) 7.5-325 MG tablet Take 1 tablet by mouth every 4 (four) hours as needed for severe pain.   Polyethyl Glycol-Propyl Glycol (SYSTANE ULTRA) 0.4-0.3 %  SOLN Place 1 drop into both eyes daily as needed (for dry eyes).    PULMICORT FLEXHALER 90 MCG/ACT inhaler INHALE 2 PUFFS INTO THE LUNGS TWICE DAILY   tacrolimus (PROTOPIC) 0.1 % ointment APPLY TOPICALLY TO THE AFFECTED AREA DAILY   Tdap (BOOSTRIX) 5-2.5-18.5 LF-MCG/0.5 injection Inject 0.5 mLs into the muscle once for 1 dose.   [DISCONTINUED] diclofenac Sodium (VOLTAREN) 1 % GEL Apply 2 g topically 4 (four) times daily.   [DISCONTINUED] meloxicam (MOBIC) 7.5 MG tablet Take 1 tablet (7.5 mg  total) by mouth 2 (two) times daily.   [DISCONTINUED] montelukast (SINGULAIR) 10 MG tablet Take 1 tablet (10 mg total) by mouth at bedtime.   [DISCONTINUED] norethindrone-ethinyl estradiol-iron (LOESTRIN FE) 1.5-30 MG-MCG tablet Take 1 tablet by mouth daily.   diclofenac Sodium (VOLTAREN) 1 % GEL Apply 2 g topically 4 (four) times daily.   fluconazole (DIFLUCAN) 150 MG tablet Take one tab a day for 3 days   meloxicam (MOBIC) 7.5 MG tablet Take 1 tablet (7.5 mg total) by mouth 2 (two) times daily.   montelukast (SINGULAIR) 10 MG tablet Take 1 tablet (10 mg total) by mouth at bedtime.   nystatin (MYCOSTATIN) 100000 UNIT/ML suspension Take 5 mLs (500,000 Units total) by mouth 4 (four) times daily.   [DISCONTINUED] fluconazole (DIFLUCAN) 150 MG tablet Take one tab a day for 3 days (Patient not taking: Reported on 06/13/2022)   [DISCONTINUED] nystatin (MYCOSTATIN) 100000 UNIT/ML suspension Take 5 mLs (500,000 Units total) by mouth 4 (four) times daily. (Patient not taking: Reported on 06/13/2022)   [EXPIRED] cyanocobalamin (VITAMIN B12) injection 1,000 mcg    No facility-administered encounter medications on file as of 06/13/2022.    Surgical History: Past Surgical History:  Procedure Laterality Date   carpel tunnel  Left 2014   CRANIOTOMY  14,06   chiari   DENTAL SURGERY     HERNIA REPAIR     umbilical   PLACEMENT OF LUMBAR DRAIN N/A 07/07/2014   Procedure: PLACEMENT OF LUMBAR DRAIN AND CLOSURE OF CERVICAL INCISION;  Surgeon: Newman Pies, MD;  Location: [facility];  Service: Neurosurgery;  Laterality: N/A;   SUBOCCIPITAL CRANIECTOMY CERVICAL LAMINECTOMY N/A 09/29/2013   Procedure: SUBOCCIPITAL CRANIECTOMY CERVICAL LAMINECTOMY/DURAPLASTY;  Surgeon: Ophelia Charter, MD;  Location: [facility];  Service: Neurosurgery;  Laterality: N/A;  posterior   SUBOCCIPITAL CRANIECTOMY CERVICAL LAMINECTOMY N/A 06/23/2014   Procedure: SUBOCCIPITAL CRANIECTOMY CERVICAL LAMINECTOMY/DURAPLASTY;  Surgeon:  Newman Pies, MD;  Location: [facility];  Service: Neurosurgery;  Laterality: N/A;  suboccipital craniectomy with cervical laminectomy and duraplasty    Medical History: Past Medical History:  Diagnosis Date   Anxiety    Arthritis    rheumo possible   Chiari malformation    Depression    Dizziness    Headache(784.0)    History of kidney stones    Hypertension    Lupus (HCC)    Lupus (HCC)    Nerve damage    NECK   PONV (postoperative nausea and vomiting)    nausea only   Sleep apnea    TMJ arthritis     Family History: Family History  Problem Relation Age of Onset   Heart disease Father    Hypertension Father    Hypertension Mother    Thyroid disease Mother    Asthma Other    Depression Other    Diabetes Other    Migraines Other    Stroke Other       Review of Systems  Constitutional:  Negative for chills and unexpected weight change.  HENT:  Negative for congestion, rhinorrhea, sneezing and sore throat.   Eyes:  Negative for redness.  Respiratory:  Negative for cough, chest tightness and shortness of breath.   Cardiovascular:  Negative for chest pain and palpitations.  Gastrointestinal:  Negative for abdominal pain, constipation, diarrhea, nausea and vomiting.  Genitourinary:  Negative for dysuria and frequency.  Musculoskeletal:  Positive for arthralgias. Negative for back pain, joint swelling and neck pain.  Skin:  Negative for rash.  Neurological: Negative.  Negative for tremors and numbness.  Hematological:  Negative for adenopathy. Does not bruise/bleed easily.  Psychiatric/Behavioral:  Negative for behavioral problems (Depression), sleep disturbance and suicidal ideas. The patient is not nervous/anxious.      Vital Signs: BP 128/76 Comment: 137/93  Pulse 98   Temp 98.2 F (36.8 C)   Resp 16   Ht 5' 6"  (1.676 m)   Wt 227 lb (103 kg)   LMP 05/27/2022   SpO2 100%   BMI 36.64 kg/m    Physical Exam Vitals and nursing note reviewed.   Constitutional:      Appearance: Normal appearance. She is obese.  HENT:     Head: Normocephalic and atraumatic.     Nose: Nose normal.     Mouth/Throat:     Mouth: Mucous membranes are moist.     Pharynx: No posterior oropharyngeal erythema.  Eyes:     Extraocular Movements: Extraocular movements intact.     Pupils: Pupils are equal, round, and reactive to light.  Cardiovascular:     Rate and Rhythm: Normal rate and regular rhythm.     Pulses: Normal pulses.     Heart sounds: Normal heart sounds.  Pulmonary:     Effort: Pulmonary effort is normal.     Breath sounds: Normal breath sounds.  Chest:     Chest wall: No tenderness.  Breasts:    Right: No mass.     Left: No mass.  Abdominal:     General: Abdomen is flat.     Palpations: Abdomen is soft.     Tenderness: There is no abdominal tenderness.  Musculoskeletal:        General: Normal range of motion.     Cervical back: Normal range of motion.  Skin:    General: Skin is warm and dry.  Neurological:     General: No focal deficit present.     Mental Status: She is alert.  Psychiatric:        Mood and Affect: Mood normal.        Behavior: Behavior normal.      LABS: Recent Results (from the past 2160 hour(s))  Lipid Panel With LDL/HDL Ratio     Status: Abnormal   Collection Time: 06/12/22  8:28 AM  Result Value Ref Range   Cholesterol, Total 167 100 - 199 mg/dL   Triglycerides 274 (H) 0 - 149 mg/dL   HDL 34 (L) >39 mg/dL   VLDL Cholesterol Cal 46 (H) 5 - 40 mg/dL   LDL Chol Calc (NIH) 87 0 - 99 mg/dL   LDL/HDL Ratio 2.6 0.0 - 3.2 ratio    Comment:                                     LDL/HDL Ratio  Men  Women                               1/2 Avg.Risk  1.0    1.5                                   Avg.Risk  3.6    3.2                                2X Avg.Risk  6.2    5.0                                3X Avg.Risk  8.0    6.1   TSH + free T4     Status: None    Collection Time: 06/12/22  8:28 AM  Result Value Ref Range   TSH 1.290 0.450 - 4.500 uIU/mL   Free T4 1.43 0.82 - 1.77 ng/dL  Comprehensive metabolic panel     Status: Abnormal   Collection Time: 06/12/22  8:28 AM  Result Value Ref Range   Glucose 66 (L) 70 - 99 mg/dL   BUN 6 6 - 24 mg/dL   Creatinine, Ser 0.91 0.57 - 1.00 mg/dL   eGFR 81 >59 mL/min/1.73   BUN/Creatinine Ratio 7 (L) 9 - 23   Sodium 136 134 - 144 mmol/L   Potassium 4.5 3.5 - 5.2 mmol/L   Chloride 97 96 - 106 mmol/L   CO2 23 20 - 29 mmol/L   Calcium 9.6 8.7 - 10.2 mg/dL   Total Protein 8.1 6.0 - 8.5 g/dL   Albumin 4.2 3.9 - 4.9 g/dL   Globulin, Total 3.9 1.5 - 4.5 g/dL   Albumin/Globulin Ratio 1.1 (L) 1.2 - 2.2   Bilirubin Total 0.3 0.0 - 1.2 mg/dL   Alkaline Phosphatase 53 44 - 121 IU/L   AST 17 0 - 40 IU/L   ALT 8 0 - 32 IU/L  B12 and Folate Panel     Status: None   Collection Time: 06/12/22  8:28 AM  Result Value Ref Range   Vitamin B-12 334 232 - 1,245 pg/mL   Folate >20.0 >3.0 ng/mL    Comment: A serum folate concentration of less than 3.1 ng/mL is considered to represent clinical deficiency.   Fe+TIBC+Fer     Status: Abnormal   Collection Time: 06/12/22  8:28 AM  Result Value Ref Range   Total Iron Binding Capacity 335 250 - 450 ug/dL   UIBC 269 131 - 425 ug/dL   Iron 66 27 - 159 ug/dL   Iron Saturation 20 15 - 55 %   Ferritin 263 (H) 15 - 150 ng/mL  VITAMIN D 25 Hydroxy (Vit-D Deficiency, Fractures)     Status: Abnormal   Collection Time: 06/12/22  8:28 AM  Result Value Ref Range   Vit D, 25-Hydroxy 19.5 (L) 30.0 - 100.0 ng/mL    Comment: Vitamin D deficiency has been defined by the [place] practice guideline as a level of serum 25-OH vitamin D less than 20 ng/mL (1,2). The Endocrine Society went on to further define vitamin D insufficiency as a level between 21 and 29 ng/mL (2). 1. IOM (Institute of Medicine). 2010. Dietary  reference    intakes for  calcium and D. [place]: The    [employer]. 2. Holick MF, Binkley [place], Bischoff-Ferrari HA, et al.    Evaluation, treatment, and prevention of vitamin D    deficiency: an Endocrine Society clinical practice    guideline. JCEM. 2011 Jul; 96(7):1911-30.         Assessment/Plan: 1. Encounter for general adult medical examination with abnormal findings CPE performed, labs reviewed, mammogram ordered, up-to-date on Pap  2. Essential hypertension Stable, continue current medication  3. Inflammatory polyarthritis ([place]) Followed by rheumatology - diclofenac Sodium (VOLTAREN) 1 % GEL; Apply 2 g topically 4 (four) times daily.  Dispense: 350 g; Refill: 2 - meloxicam (MOBIC) 7.5 MG tablet; Take 1 tablet (7.5 mg total) by mouth 2 (two) times daily.  Dispense: 180 tablet; Refill: 1  4. Lupus ([place]) Followed by rheumatology  5. Mild intermittent asthma without complication Continue inhalers as prescribed - montelukast (SINGULAIR) 10 MG tablet; Take 1 tablet (10 mg total) by mouth at bedtime.  Dispense: 90 tablet; Refill: 3  6. Thrush Advised to rinse mouth after use of inhalers to help avoid recurrent thrush and may utilize Diflucan and nystatin oral rinse - fluconazole (DIFLUCAN) 150 MG tablet; Take one tab a day for 3 days  Dispense: 3 tablet; Refill: 0 - nystatin (MYCOSTATIN) 100000 UNIT/ML suspension; Take 5 mLs (500,000 Units total) by mouth 4 (four) times daily.  Dispense: 60 mL; Refill: 2  7. Need for Tdap vaccination - Tdap ([place]) 5-2.5-18.5 LF-MCG/0.5 injection; Inject 0.5 mLs into the muscle once for 1 dose.  Dispense: 0.5 mL; Refill: 0  8. Vitamin D deficiency - ergocalciferol (DRISDOL) 1.25 MG (50000 UT) capsule; Take one cap q week  Dispense: 12 capsule; Refill: 3  9. B12 deficiency - cyanocobalamin (VITAMIN B12) injection 1,000 mcg  10. Visit for screening mammogram - MM 3D SCREEN BREAST BILATERAL; Future  11. Dysuria - UA/M w/rflx Culture,  Routine   General Counseling: Avaleen verbalizes understanding of the findings of todays visit and agrees with plan of treatment. I have discussed any further diagnostic evaluation that may be needed or ordered today. We also reviewed her medications today. she has been encouraged to call the office with any questions or concerns that should arise related to todays visit.    Counseling:    Orders Placed This Encounter  Procedures   MM 3D SCREEN BREAST BILATERAL   UA/M w/rflx Culture, Routine    Meds ordered this encounter  Medications   diclofenac Sodium (VOLTAREN) 1 % GEL    Sig: Apply 2 g topically 4 (four) times daily.    Dispense:  350 g    Refill:  2   meloxicam (MOBIC) 7.5 MG tablet    Sig: Take 1 tablet (7.5 mg total) by mouth 2 (two) times daily.    Dispense:  180 tablet    Refill:  1   montelukast (SINGULAIR) 10 MG tablet    Sig: Take 1 tablet (10 mg total) by mouth at bedtime.    Dispense:  90 tablet    Refill:  3   fluconazole (DIFLUCAN) 150 MG tablet    Sig: Take one tab a day for 3 days    Dispense:  3 tablet    Refill:  0   nystatin (MYCOSTATIN) 100000 UNIT/ML suspension    Sig: Take 5 mLs (500,000 Units total) by mouth 4 (four) times daily.    Dispense:  60 mL    Refill:  2   Tdap (BOOSTRIX) 5-2.5-18.5 LF-MCG/0.5 injection    Sig: Inject 0.5 mLs into the muscle once for 1 dose.    Dispense:  0.5 mL    Refill:  0   ergocalciferol (DRISDOL) 1.25 MG (50000 UT) capsule    Sig: Take one cap q week    Dispense:  12 capsule    Refill:  3   cyanocobalamin (VITAMIN B12) injection 1,000 mcg    This patient was seen by Drema Dallas, PA-C in collaboration with Dr. Clayborn Bigness as a part of collaborative care agreement.  Total time spent:35 Minutes  Time spent includes review of chart, medications, test results, and follow up plan with the patient.     Lavera Guise, MD  Internal Medicine

## 2022-06-14 LAB — UA/M W/RFLX CULTURE, ROUTINE
Bilirubin, UA: NEGATIVE
Glucose, UA: NEGATIVE
Ketones, UA: NEGATIVE
Leukocytes,UA: NEGATIVE
Nitrite, UA: NEGATIVE
Protein,UA: NEGATIVE
RBC, UA: NEGATIVE
Specific Gravity, UA: 1.011 (ref 1.005–1.030)
Urobilinogen, Ur: 0.2 mg/dL (ref 0.2–1.0)
pH, UA: 6 (ref 5.0–7.5)

## 2022-06-14 LAB — MICROSCOPIC EXAMINATION
Bacteria, UA: NONE SEEN
Casts: NONE SEEN /lpf
Epithelial Cells (non renal): NONE SEEN /hpf (ref 0–10)
WBC, UA: NONE SEEN /hpf (ref 0–5)

## 2022-06-17 ENCOUNTER — Ambulatory Visit (INDEPENDENT_AMBULATORY_CARE_PROVIDER_SITE_OTHER): Payer: Medicare Other | Admitting: Dermatology

## 2022-06-17 ENCOUNTER — Encounter: Payer: Self-pay | Admitting: Dermatology

## 2022-06-17 DIAGNOSIS — B379 Candidiasis, unspecified: Secondary | ICD-10-CM

## 2022-06-17 DIAGNOSIS — L930 Discoid lupus erythematosus: Secondary | ICD-10-CM

## 2022-06-17 DIAGNOSIS — L700 Acne vulgaris: Secondary | ICD-10-CM

## 2022-06-17 DIAGNOSIS — K130 Diseases of lips: Secondary | ICD-10-CM | POA: Diagnosis not present

## 2022-06-17 DIAGNOSIS — M3219 Other organ or system involvement in systemic lupus erythematosus: Secondary | ICD-10-CM | POA: Diagnosis not present

## 2022-06-17 MED ORDER — KETOCONAZOLE 2 % EX SHAM: MEDICATED_SHAMPOO | CUTANEOUS | 11 refills | Status: DC

## 2022-06-17 MED ORDER — TRIAMCINOLONE ACETONIDE 0.1 % EX CREA: 1.0000 | TOPICAL_CREAM | Freq: Two times a day (BID) | CUTANEOUS | 2 refills | Status: DC | PRN

## 2022-06-17 MED ORDER — FLUCONAZOLE 200 MG PO TABS: ORAL_TABLET | ORAL | 1 refills | Status: DC

## 2022-06-17 MED ORDER — MOMETASONE FUROATE 0.1 % EX OINT: TOPICAL_OINTMENT | CUTANEOUS | 6 refills | Status: DC

## 2022-06-17 MED ORDER — MUPIROCIN 2 % EX OINT: TOPICAL_OINTMENT | CUTANEOUS | 5 refills | Status: DC

## 2022-06-17 MED ORDER — MOMETASONE FUROATE 0.1 % EX SOLN: Freq: Every day | CUTANEOUS | 0 refills | Status: DC

## 2022-06-17 NOTE — Progress Notes (Signed)
Follow-Up Visit   Subjective  Morgan Kent is a 41 y.o. female who presents for the following: Lupus (6 month follow up. Face, scalp. Has SLE and DLE. On plaquenil from Dr. Scharlene Gloss office. Using Mometasone, mupirocin and aczone (for DLE and acne) as directed). The patient has spots, moles and lesions to be evaluated, some may be new or changing and the patient has concerns that these could be cancer.  The following portions of the chart were reviewed this encounter and updated as appropriate:  Tobacco  Allergies  Meds  Problems  Med Hx  Surg Hx  Fam Hx     Review of Systems: No other skin or systemic complaints except as noted in HPI or Assessment and Plan.  Objective  Well appearing patient in no apparent distress; mood and affect are within normal limits.  A focused examination was performed including face, scalp. Relevant physical exam findings are noted in the Assessment and Plan.  Head - Anterior (Face) Hyperpigmented scaly patches on cheeks, scaly/crusted hyperpigmented patches on arms  Head - Anterior (Face) Erythematous papules and pustules with comedones   corners of mouth Erythema with maceration    Assessment & Plan  Discoid lupus erythematosus Head - Anterior (Face); arms Chronic and persistent condition with duration or expected duration over one year. Condition is symptomatic / bothersome to patient. Not to goal.  Continue Mometasone cream to thickened, rough active areas on face daily, five days a week as needed for lupus patches on face.   Continue Mupirocin ointment for open sores anywhere on skin  Start Triamcinolone 0.1% cream daily 5 days a week for lupus areas on arms.   Continue Mometasone solution to scalp once or twice daily as needed.   Continue Ketoconazole shampoo 1-2 times a week as directed.  mometasone (ELOCON) 0.1 % lotion - Head - Anterior (Face) Apply topically daily. ketoconazole (NIZORAL) 2 % shampoo - Head - Anterior  (Face) 1-2 times a week lather on scalp, leave on 5-8 minutes, rinse well triamcinolone cream (KENALOG) 0.1 % - Head - Anterior (Face) Apply 1 Application topically 2 (two) times daily as needed. Use up to 5 days a week as needed Related Medications mupirocin ointment (BACTROBAN) 2 % Apply to open area on skin qd-bid mometasone (ELOCON) 0.1 % ointment APPLY TO LUPUS SORES ON FACE TOPICALLY DAILY AS NEEDED FOR FLARES  Acne vulgaris Head - Anterior (Face) Chronic and persistent condition with duration or expected duration over one year. Condition is symptomatic / bothersome to patient. Not to goal. Continue Dapsone to face once daily as directed.   Related Medications Dapsone 7.5 % GEL Apply 1 application topically daily.  Angular cheilitis corners of mouth With Candidia Chronic and persistent condition with duration or expected duration over one year. Condition is symptomatic / bothersome to patient. Not to goal.  Recurrent and flared today Start Fluconazole 200 mg tablets once a week for 2 weeks, refill if needed.  Continue Tacrolimus ointment twice daily to corners of mouth as needed.   fluconazole (DIFLUCAN) 200 MG tablet - corners of mouth Take one pill once a week for 2 weeks Related Medications tacrolimus (PROTOPIC) 0.1 % ointment APPLY TOPICALLY TO THE AFFECTED AREA DAILY  Systemic lupus erythematosus with other organ involvement, unspecified SLE type ([place]) body Controlled by Dr Winfield Rast at [facility] --  pt taking Plaquenil tablets.  Pt to continue f/u with Dr. Winfield Rast.  Return in about 6 months (around 12/18/2022) for DLE, SLE, Acne, angular cheilitis .  I, Emelia Salisbury, CMA, am acting as scribe for Sarina Ser, MD. Documentation: I have reviewed the above documentation for accuracy and completeness, and I agree with the above.  Sarina Ser, MD

## 2022-06-17 NOTE — Patient Instructions (Addendum)
Continue Mometasone cream to thickened, rough active areas on face daily, five days a week for lupus patches on face.   Continue Mupirocin ointment for open sores  Start Triamcinolone 0.1% cream daily 5 days a week for lupus areas on arms. Avoid applying to face, groin, and axilla. Use as directed. Long-term use can cause thinning of the skin.   Continue Mometasone solution to scalp once or twice daily as needed.   Continue Ketoconazole shampoo 1-2 times a week as directed.  Acne Continue Dapsone to face once daily as directed.   Thrush Start Fluconazole 200 mg tablets once a week for 2 weeks, refill if needed.   Continue Tacrolimus ointment twice daily to corners of mouth as needed.   Topical steroids (such as triamcinolone, fluocinolone, fluocinonide, mometasone, clobetasol, halobetasol, betamethasone, hydrocortisone) can cause thinning and lightening of the skin if they are used for too long in the same area. Your physician has selected the right strength medicine for your problem and area affected on the body. Please use your medication only as directed by your physician to prevent side effects.     Due to recent changes in healthcare laws, you may see results of your pathology and/or laboratory studies on MyChart before the doctors have had a chance to review them. We understand that in some cases there may be results that are confusing or concerning to you. Please understand that not all results are received at the same time and often the doctors may need to interpret multiple results in order to provide you with the best plan of care or course of treatment. Therefore, we ask that you please give Morgan Kent 2 business days to thoroughly review all your results before contacting the office for clarification. Should we see a critical lab result, you will be contacted sooner.   If You Need Anything After Your Visit  If you have any questions or concerns for your doctor, please call our main  line at [phone] and press option 4 to reach your doctor's medical assistant. If no one answers, please leave a voicemail as directed and we will return your call as soon as possible. Messages left after 4 pm will be answered the following business day.   You may also send Morgan Kent a message via [place]. We typically respond to MyChart messages within 1-2 business days.  For prescription refills, please ask your pharmacy to contact our office. Our fax number is [phone].  If you have an urgent issue when the clinic is closed that cannot wait until the next business day, you can page your doctor at the number below.    Please note that while we do our best to be available for urgent issues outside of office hours, we are not available 24/7.   If you have an urgent issue and are unable to reach Morgan Kent, you may choose to seek medical care at your doctor's office, retail clinic, urgent care center, or emergency room.  If you have a medical emergency, please immediately call 911 or go to the emergency department.  Pager Numbers  - Dr. Nehemiah Massed: [phone]  - Dr. Laurence Ferrari: [phone]  - Dr. Nicole Kindred: [phone]  In the event of inclement weather, please call our main line at [phone] for an update on the status of any delays or closures.  Dermatology Medication Tips: Please keep the boxes that topical medications come in in order to help keep track of the instructions about where and how to use these. Pharmacies typically print the  medication instructions only on the boxes and not directly on the medication tubes.   If your medication is too expensive, please contact our office at [phone] option 4 or send Morgan Kent a message through [place].   We are unable to tell what your co-pay for medications will be in advance as this is different depending on your insurance coverage. However, we may be able to find a substitute medication at lower cost or fill out paperwork to get insurance to cover a  needed medication.   If a prior authorization is required to get your medication covered by your insurance company, please allow Morgan Kent 1-2 business days to complete this process.  Drug prices often vary depending on where the prescription is filled and some pharmacies may offer cheaper prices.  The website www.goodrx.com contains coupons for medications through different pharmacies. The prices here do not account for what the cost may be with help from insurance (it may be cheaper with your insurance), but the website can give you the price if you did not use any insurance.  - You can print the associated coupon and take it with your prescription to the pharmacy.  - You may also stop by our office during regular business hours and pick up a GoodRx coupon card.  - If you need your prescription sent electronically to a different pharmacy, notify our office through [facility] or by phone at [phone] option 4.     Si Usted Necesita Algo Despus de Su Visita  Tambin puede enviarnos un mensaje a travs de Pharmacist, community. Por lo general respondemos a los mensajes de MyChart en el transcurso de 1 a 2 das hbiles.  Para renovar recetas, por favor pida a su farmacia que se ponga en contacto con nuestra oficina. Harland Dingwall de fax es [place] [phone].  Si tiene un asunto urgente cuando la clnica est cerrada y que no puede esperar hasta el siguiente da hbil, puede llamar/localizar a su doctor(a) al nmero que aparece a continuacin.   Por favor, tenga en cuenta que aunque hacemos todo lo posible para estar disponibles para asuntos urgentes fuera del horario de [place], no estamos disponibles las 24 horas del da, los 7 das de la [place].   Si tiene un problema urgente y no puede comunicarse con nosotros, puede optar por buscar atencin mdica  en el consultorio de su doctor(a), en una clnica privada, en un centro de atencin urgente o en una sala de emergencias.  Si tiene Architect, por favor llame inmediatamente al 911 o vaya a la sala de emergencias.  Nmeros de bper  - Dr. Nehemiah Massed: [phone]  - Dra. Moye: [phone]  - Dra. Nicole Kindred: [phone]  En caso de inclemencias del [place], por favor llame a Johnsie Kindred principal al [phone] para una actualizacin sobre el [place] de cualquier retraso o cierre.  Consejos para la medicacin en dermatologa: Por favor, guarde las cajas en las que vienen los medicamentos de uso tpico para ayudarle a seguir las instrucciones sobre dnde y cmo usarlos. Las farmacias generalmente imprimen las instrucciones del medicamento slo en las cajas y no directamente en los tubos del [place].   Si su medicamento es muy caro, por favor, pngase en contacto con Zigmund Daniel llamando al [phone] y presione la opcin 4 o envenos un mensaje a travs de Pharmacist, community.   No podemos decirle cul ser su copago por los medicamentos por adelantado ya que esto es diferente dependiendo de la cobertura de su seguro. Sin  embargo, es posible que podamos encontrar un medicamento sustituto a Electrical engineer un formulario para que el seguro cubra el medicamento que se considera necesario.   Si se requiere una autorizacin previa para que su compaa de seguros [place] su medicamento, por favor permtanos de 1 a 2 das hbiles para completar este proceso.  Los precios de los medicamentos varan con frecuencia dependiendo del Environmental consultant de dnde se surte la receta y alguna farmacias pueden ofrecer precios ms baratos.  El sitio web www.goodrx.com tiene cupones para medicamentos de Airline pilot. Los precios aqu no tienen en cuenta lo que podra costar con la ayuda del seguro (puede ser ms barato con su seguro), pero el sitio web puede darle el precio si no utiliz Research scientist (physical sciences).  - Puede imprimir el cupn correspondiente y llevarlo con su receta a la farmacia.  - Tambin puede pasar por nuestra oficina durante el horario de  atencin regular y Charity fundraiser una tarjeta de cupones de GoodRx.  - Si necesita que su receta se enve electrnicamente a una farmacia diferente, informe a nuestra oficina a travs de MyChart de [place] o por telfono llamando al [phone] y presione la opcin 4.

## 2022-06-20 ENCOUNTER — Ambulatory Visit (INDEPENDENT_AMBULATORY_CARE_PROVIDER_SITE_OTHER): Payer: Medicare Other

## 2022-06-20 DIAGNOSIS — E538 Deficiency of other specified B group vitamins: Secondary | ICD-10-CM | POA: Diagnosis not present

## 2022-06-20 MED ORDER — CYANOCOBALAMIN 1000 MCG/ML IJ SOLN
1000.0000 ug | Freq: Once | INTRAMUSCULAR | Status: AC
  Administered 2022-06-20: 1000 ug via INTRAMUSCULAR

## 2022-06-20 NOTE — Procedures (Signed)
[facility] [address] [place], [address]    Complete Pulmonary Function Testing Interpretation:  FINDINGS:  Forced vital capacity is mildly decreased.  FEV1 is 1.51 L which is 55% of predicted and is moderately decreased.  F1 FVC ratio is mildly decreased.  Postbronchodilator there is no significant change in the FEV1 however clinical improvement may occur in the absence of spirometric improvement.  Total lung capacity is normal residual volume is increased residual on total lung capacity ratio is increased FRC is normal DLCO is within normal limits.  IMPRESSION:  This pulmonary function study is suggestive of moderate obstructive lung disease clinical correlation recommended  Allyne Gee, MD [facility] Pulmonary Critical Care Medicine Sleep Medicine

## 2022-06-25 DIAGNOSIS — M329 Systemic lupus erythematosus, unspecified: Secondary | ICD-10-CM | POA: Diagnosis not present

## 2022-06-25 DIAGNOSIS — L930 Discoid lupus erythematosus: Secondary | ICD-10-CM | POA: Diagnosis not present

## 2022-06-25 DIAGNOSIS — I7300 Raynaud's syndrome without gangrene: Secondary | ICD-10-CM | POA: Diagnosis not present

## 2022-06-25 DIAGNOSIS — Z79899 Other long term (current) drug therapy: Secondary | ICD-10-CM | POA: Diagnosis not present

## 2022-06-26 ENCOUNTER — Other Ambulatory Visit: Payer: Self-pay

## 2022-06-27 ENCOUNTER — Ambulatory Visit (INDEPENDENT_AMBULATORY_CARE_PROVIDER_SITE_OTHER): Payer: Medicare Other

## 2022-06-27 DIAGNOSIS — E538 Deficiency of other specified B group vitamins: Secondary | ICD-10-CM | POA: Diagnosis not present

## 2022-06-27 MED ORDER — CYANOCOBALAMIN 1000 MCG/ML IJ SOLN
1000.0000 ug | Freq: Once | INTRAMUSCULAR | Status: AC
  Administered 2022-06-27: 1000 ug via INTRAMUSCULAR

## 2022-06-28 LAB — PULMONARY FUNCTION TEST

## 2022-07-01 ENCOUNTER — Ambulatory Visit (INDEPENDENT_AMBULATORY_CARE_PROVIDER_SITE_OTHER): Payer: Medicare Other | Admitting: Internal Medicine

## 2022-07-01 ENCOUNTER — Encounter: Payer: Self-pay | Admitting: Internal Medicine

## 2022-07-01 VITALS — BP 129/78 | HR 98 | Temp 98.2°F | Resp 16 | Ht 66.0 in | Wt 224.0 lb

## 2022-07-01 DIAGNOSIS — Z7189 Other specified counseling: Secondary | ICD-10-CM

## 2022-07-01 DIAGNOSIS — M329 Systemic lupus erythematosus, unspecified: Secondary | ICD-10-CM | POA: Diagnosis not present

## 2022-07-01 DIAGNOSIS — G4733 Obstructive sleep apnea (adult) (pediatric): Secondary | ICD-10-CM | POA: Diagnosis not present

## 2022-07-01 DIAGNOSIS — R0602 Shortness of breath: Secondary | ICD-10-CM | POA: Diagnosis not present

## 2022-07-01 DIAGNOSIS — J449 Chronic obstructive pulmonary disease, unspecified: Secondary | ICD-10-CM | POA: Diagnosis not present

## 2022-07-01 NOTE — Progress Notes (Signed)
[facility] [place], [place] [address]  Pulmonary Sleep Medicine   Office Visit Note  Patient Name: Morgan Kent DOB: July 12, 1981 MRN [id number]  Date of Service: 07/01/2022  Complaints/HPI: She was diagnosed with lupus about 10 years. Mostly involves the skin and arthritis. Patient also has DJD. She also states she has RA and carpal tunnel. She has dry eyes and mouth. Patient states she has no real breathing difficulty but there is occasional SOB. She denies having cough or congestion. She is using a CPAP for OSA. She notes there is a cough at night. She has no history of Chest pain. She had a CXR in July which was showing no significant disease. She has edema of legs. She also has a diagnosis of cardiac issues but recently she states her cardiologist said she was doing well. PFT done this month had shown MODERATE obstruction and some restriction. She does not smoke has been around smokers. She states she is disabled but has worked in [employer] prior. She denies any other occupational exposure.   ROS  General: (-) fever, (-) chills, (-) night sweats, (-) weakness Skin: (-) rashes, (-) itching,. Eyes: (-) visual changes, (-) redness, (-) itching. Nose and Sinuses: (-) nasal stuffiness or itchiness, (-) postnasal drip, (-) nosebleeds, (-) sinus trouble. Mouth and Throat: (-) sore throat, (-) hoarseness. Neck: (-) swollen glands, (-) enlarged thyroid, (-) neck pain. Respiratory: + cough, (-) bloody sputum, + shortness of breath, - wheezing. Cardiovascular: - ankle swelling, (-) chest pain. Lymphatic: (-) lymph node enlargement. Neurologic: (-) numbness, (-) tingling. Psychiatric: (-) anxiety, (-) depression   Current Medication: Outpatient Encounter Medications as of 07/01/2022  Medication Sig   albuterol (VENTOLIN HFA) 108 (90 Base) MCG/ACT inhaler Inhale 2 puffs into the lungs daily.   amLODipine (NORVASC) 5 MG tablet TAKE 1 TABLET(5 MG) BY MOUTH  DAILY   Budeson-Glycopyrrol-Formoterol (BREZTRI AEROSPHERE) 160-9-4.8 MCG/ACT AERO Inhale 2 puffs into the lungs 2 (two) times daily.   carvedilol (COREG) 25 MG tablet TAKE 1 TABLET(25 MG) BY MOUTH TWICE DAILY   cetirizine (ZYRTEC) 10 MG tablet Take 1 tablet (10 mg total) by mouth daily.   clobetasol (TEMOVATE) 0.05 % external solution Apply 1 application. topically 2 (two) times daily as needed. Apply to scalp   cyclobenzaprine (FLEXERIL) 10 MG tablet Take 1 tablet (10 mg total) by mouth 3 (three) times daily as needed.   Dapsone 7.5 % GEL Apply 1 application topically daily.   diclofenac Sodium (VOLTAREN) 1 % GEL Apply 2 g topically 4 (four) times daily.   ergocalciferol (DRISDOL) 1.25 MG (50000 UT) capsule Take one cap q week   ferrous sulfate (FERROUSUL) 325 (65 FE) MG tablet Take 1 tablet (325 mg total) by mouth daily with breakfast.   fluconazole (DIFLUCAN) 150 MG tablet Take one tab a day for 3 days   fluconazole (DIFLUCAN) 200 MG tablet Take one pill once a week for 2 weeks   fluticasone (FLONASE) 50 MCG/ACT nasal spray Place 1 spray into both nostrils at bedtime.   folic acid (FOLVITE) 1 MG tablet Take 1 mg by mouth daily.   gabapentin (NEURONTIN) 300 MG capsule TAKE 1 CAPSULE(300 MG) BY MOUTH THREE TIMES DAILY   hydrochlorothiazide (HYDRODIURIL) 12.5 MG tablet TAKE 1 TABLET(12.5 MG) BY MOUTH DAILY   hydroxychloroquine (PLAQUENIL) 200 MG tablet Take 1 tablet (200 mg total) by mouth daily.   JUNEL 1.5/30 1.5-30 MG-MCG tablet TAKE 1 TABLET BY MOUTH DAILY   ketoconazole (  NIZORAL) 2 % shampoo APPLY 1 APPLICATION TOPICALLY EVERY 3 DAYS. LET SIT 5 MINUTES BEFORE RINSING OUT   ketoconazole (NIZORAL) 2 % shampoo 1-2 times a week lather on scalp, leave on 5-8 minutes, rinse well   lisinopril (ZESTRIL) 5 MG tablet TAKE 1 TABLET(5 MG) BY MOUTH DAILY   meloxicam (MOBIC) 7.5 MG tablet Take 1 tablet (7.5 mg total) by mouth 2 (two) times daily.   methotrexate (RHEUMATREX) 2.5 MG tablet Take 2.5 mg by  mouth once a week. Caution:Chemotherapy. Protect from light.   mometasone (ELOCON) 0.1 % lotion Apply topically daily.   mometasone (ELOCON) 0.1 % ointment APPLY TO LUPUS SORES ON FACE TOPICALLY DAILY AS NEEDED FOR FLARES   montelukast (SINGULAIR) 10 MG tablet Take 1 tablet (10 mg total) by mouth at bedtime.   mupirocin ointment (BACTROBAN) 2 % Apply to open area on skin qd-bid   NON FORMULARY CPAP with AHP   nystatin (MYCOSTATIN) 100000 UNIT/ML suspension Take 5 mLs (500,000 Units total) by mouth 4 (four) times daily.   oxyCODONE-acetaminophen (PERCOCET) 7.5-325 MG tablet Take 1 tablet by mouth every 4 (four) hours as needed for severe pain.   Polyethyl Glycol-Propyl Glycol (SYSTANE ULTRA) 0.4-0.3 % SOLN Place 1 drop into both eyes daily as needed (for dry eyes).    PULMICORT FLEXHALER 90 MCG/ACT inhaler INHALE 2 PUFFS INTO THE LUNGS TWICE DAILY   tacrolimus (PROTOPIC) 0.1 % ointment APPLY TOPICALLY TO THE AFFECTED AREA DAILY   triamcinolone cream (KENALOG) 0.1 % Apply 1 Application topically 2 (two) times daily as needed. Use up to 5 days a week as needed   No facility-administered encounter medications on file as of 07/01/2022.    Surgical History: Past Surgical History:  Procedure Laterality Date   carpel tunnel  Left 2014   CRANIOTOMY  14,06   chiari   DENTAL SURGERY     HERNIA REPAIR     umbilical   PLACEMENT OF LUMBAR DRAIN N/A 07/07/2014   Procedure: PLACEMENT OF LUMBAR DRAIN AND CLOSURE OF CERVICAL INCISION;  Surgeon: Newman Pies, MD;  Location: [place] [facility] OR[facility];  Service: Neurosurgery;  Laterality: N/A;   SUBOCCIPITAL CRANIECTOMY CERVICAL LAMINECTOMY N/A 09/29/2013   Procedure: SUBOCCIPITAL CRANIECTOMY CERVICAL LAMINECTOMY/DURAPLASTY;  Surgeon: Ophelia Charter, MD;  Location: [place] NEUR[facility] ORS;  Service: Neurosurgery;  Laterality: N/A;  posterior   SUBOCCIPITAL CRANIECTOMY CERVICAL LAMINECTOMY N/A 06/23/2014   Procedure: SUBOCCIPITAL CRANIECTOMY CERVICAL LAMINECTOMY/DURAPLASTY;   Surgeon: Newman Pies, MD;  Location: [place] NEURO ORS;  Service: Neurosurgery;  Laterality: N/A;  suboccipital craniectomy with cervical laminectomy and duraplasty    Medical History: Past Medical History:  Diagnosis Date   Anxiety    Arthritis    rheumo possible   Chiari malformation    Depression    Dizziness    Headache(784.0)    History of kidney stones    Hypertension    Lupus (HCC)    Lupus (HCC)    Nerve damage    NECK   PONV (postoperative nausea and vomiting)    nausea only   Sleep apnea    TMJ arthritis     Family History: Family History  Problem Relation Age of Onset   Heart disease Father    Hypertension Father    Hypertension Mother    Thyroid disease Mother    Asthma Other    Depression Other    Diabetes Other    Migraines Other    Stroke Other     Social History: Social History   Socioeconomic  History   Marital status: Single    Spouse name: Not on file   Number of children: Not on file   Years of education: Not on file   Highest education level: Not on file  Occupational History   Not on file  Tobacco Use   Smoking status: Never   Smokeless tobacco: Never  Vaping Use   Vaping Use: Never used  Substance and Sexual Activity   Alcohol use: No    Alcohol/week: 0.0 standard drinks of alcohol   Drug use: No   Sexual activity: Not on file  Other Topics Concern   Not on file  Social History Narrative   Lives with 3 children in 2 story home.  Unemployed.  Trying to get disability.  Education: high school.   Social Determinants of Health   Financial Resource Strain: Not on file  Food Insecurity: Not on file  Transportation Needs: Not on file  Physical Activity: Not on file  Stress: Not on file  Social Connections: Not on file  Intimate Partner Violence: Not on file    Vital Signs: Blood pressure 129/78, pulse 98, temperature 98.2 F (36.8 C), resp. rate 16, height 5' 6"  (1.676 m), weight 224 lb (101.6 kg), last menstrual period  05/27/2022, SpO2 98 %.  Examination: General Appearance: The patient is well-developed, well-nourished, and in no distress. Skin: Gross inspection of skin unremarkable. Head: normocephalic, no gross deformities. Eyes: no gross deformities noted. ENT: ears appear grossly normal no exudates. Neck: Supple. No thyromegaly. No LAD. Respiratory: few crackles noted. Cardiovascular: Normal S1 and S2 without murmur or rub. Extremities: No cyanosis. pulses are equal. Neurologic: Alert and oriented. No involuntary movements.  LABS: Recent Results (from the past 2160 hour(s))  Lipid Panel With LDL/HDL Ratio     Status: Abnormal   Collection Time: 06/12/22  8:28 AM  Result Value Ref Range   Cholesterol, Total 167 100 - 199 mg/dL   Triglycerides 274 (H) 0 - 149 mg/dL   HDL 34 (L) >39 mg/dL   VLDL Cholesterol Cal 46 (H) 5 - 40 mg/dL   LDL Chol Calc (NIH) 87 0 - 99 mg/dL   LDL/HDL Ratio 2.6 0.0 - 3.2 ratio    Comment:                                     LDL/HDL Ratio                                             Men  Women                               1/2 Avg.Risk  1.0    1.5                                   Avg.Risk  3.6    3.2                                2X Avg.Risk  6.2    5.0  3X Avg.Risk  8.0    6.1   TSH + free T4     Status: None   Collection Time: 06/12/22  8:28 AM  Result Value Ref Range   TSH 1.290 0.450 - 4.500 uIU/mL   Free T4 1.43 0.82 - 1.77 ng/dL  Comprehensive metabolic panel     Status: Abnormal   Collection Time: 06/12/22  8:28 AM  Result Value Ref Range   Glucose 66 (L) 70 - 99 mg/dL   BUN 6 6 - 24 mg/dL   Creatinine, Ser 0.91 0.57 - 1.00 mg/dL   eGFR 81 >59 mL/min/1.73   BUN/Creatinine Ratio 7 (L) 9 - 23   Sodium 136 134 - 144 mmol/L   Potassium 4.5 3.5 - 5.2 mmol/L   Chloride 97 96 - 106 mmol/L   CO2 23 20 - 29 mmol/L   Calcium 9.6 8.7 - 10.2 mg/dL   Total Protein 8.1 6.0 - 8.5 g/dL   Albumin 4.2 3.9 - 4.9 g/dL   Globulin,  Total 3.9 1.5 - 4.5 g/dL   Albumin/Globulin Ratio 1.1 (L) 1.2 - 2.2   Bilirubin Total 0.3 0.0 - 1.2 mg/dL   Alkaline Phosphatase 53 44 - 121 IU/L   AST 17 0 - 40 IU/L   ALT 8 0 - 32 IU/L  B12 and Folate Panel     Status: None   Collection Time: 06/12/22  8:28 AM  Result Value Ref Range   Vitamin B-12 334 232 - 1,245 pg/mL   Folate >20.0 >3.0 ng/mL    Comment: A serum folate concentration of less than 3.1 ng/mL is considered to represent clinical deficiency.   Fe+TIBC+Fer     Status: Abnormal   Collection Time: 06/12/22  8:28 AM  Result Value Ref Range   Total Iron Binding Capacity 335 250 - 450 ug/dL   UIBC 269 131 - 425 ug/dL   Iron 66 27 - 159 ug/dL   Iron Saturation 20 15 - 55 %   Ferritin 263 (H) 15 - 150 ng/mL  VITAMIN D 25 Hydroxy (Vit-D Deficiency, Fractures)     Status: Abnormal   Collection Time: 06/12/22  8:28 AM  Result Value Ref Range   Vit D, 25-Hydroxy 19.5 (L) 30.0 - 100.0 ng/mL    Comment: Vitamin D deficiency has been defined by the [place] practice guideline as a level of serum 25-OH vitamin D less than 20 ng/mL (1,2). The Endocrine Society went on to further define vitamin D insufficiency as a level between 21 and 29 ng/mL (2). 1. IOM (Institute of Medicine). 2010. Dietary reference    intakes for calcium and D. [place]: The    [employer]. 2. Holick MF, Binkley [place], Bischoff-Ferrari HA, et al.    Evaluation, treatment, and prevention of vitamin D    deficiency: an Endocrine Society clinical practice    guideline. JCEM. 2011 Jul; 96(7):1911-30.   UA/M w/rflx Culture, Routine     Status: None   Collection Time: 06/13/22 11:30 AM   Specimen: Urine   Urine  Result Value Ref Range   Specific Gravity, UA 1.011 1.005 - 1.030   pH, UA 6.0 5.0 - 7.5   Color, UA Yellow Yellow   Appearance Ur Clear Clear   Leukocytes,UA Negative Negative   Protein,UA Negative Negative/Trace   Glucose, UA Negative  Negative   Ketones, UA Negative Negative   RBC, UA Negative Negative   Bilirubin, UA Negative Negative   Urobilinogen,  Ur 0.2 0.2 - 1.0 mg/dL   Nitrite, UA Negative Negative   Microscopic Examination Comment     Comment: Microscopic follows if indicated.   Microscopic Examination See below:     Comment: Microscopic was indicated and was performed.   Urinalysis Reflex Comment     Comment: This specimen will not reflex to a Urine Culture.  Microscopic Examination     Status: None   Collection Time: 06/13/22 11:30 AM   Urine  Result Value Ref Range   WBC, UA None seen 0 - 5 /hpf   RBC, Urine 0-2 0 - 2 /hpf   Epithelial Cells (non renal) None seen 0 - 10 /hpf   Casts None seen None seen /lpf   Bacteria, UA None seen None seen/Few  Pulmonary Function Test     Status: None   Collection Time: 06/28/22  2:27 PM  Result Value Ref Range   FEV1     FVC     FEV1/FVC     TLC     DLCO      Radiology: DG Chest 2 View  Result Date: 06/04/2022 CLINICAL DATA:  Shortness of breath.  Nonproductive cough.  Asthma. EXAM: CHEST - 2 VIEW COMPARISON:  12/06/2016 FINDINGS: The heart size and mediastinal contours are within normal limits. Both lungs are clear. The visualized skeletal structures are unremarkable. IMPRESSION: No active cardiopulmonary disease. Electronically Signed   By: Marlaine Hind M.D.   On: 06/04/2022 08:13    No results found.  DG Chest 2 View  Result Date: 06/04/2022 CLINICAL DATA:  Shortness of breath.  Nonproductive cough.  Asthma. EXAM: CHEST - 2 VIEW COMPARISON:  12/06/2016 FINDINGS: The heart size and mediastinal contours are within normal limits. Both lungs are clear. The visualized skeletal structures are unremarkable. IMPRESSION: No active cardiopulmonary disease. Electronically Signed   By: Marlaine Hind M.D.   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   Candidiasis 12/08/2018   Body mass index 40.0-44.9, adult ([place]) 12/08/2018   Impaired fasting glucose 09/07/2018   Other fatigue 09/07/2018   Vitamin D deficiency 09/07/2018   Encounter for annual general medical examination with abnormal findings in adult 05/28/2018   Screening for malignant neoplasm of cervix 05/28/2018   Dysuria 05/28/2018   Allergic rhinitis 04/16/2018   Acute recurrent sinusitis 03/20/2018   Tachycardia 08/20/2016   Shortness of breath 08/20/2016   Congestive dilated cardiomyopathy ([place]) 11/22/2015   Lupus ([place]) 11/22/2015   Moderate obesity 11/22/2015   OSA (obstructive sleep apnea) 11/22/2015   Essential hypertension 07/37/1062   Chronic systolic CHF (congestive heart failure) ([place]) 08/21/2015   Numbness and tingling 08/04/2014   Chiari malformation 08/04/2014   Sleep disorder 08/04/2014   Postprocedural pseudomeningocele 07/07/2014   Chiari malformation type I ([place]) 06/23/2014   Carpal tunnel syndrome 02/14/2014   Depression 02/14/2014   Neuralgia 02/14/2014   Migraines 02/14/2014   Sleep apnea 02/14/2014   Systemic lupus erythematosus ([place]) 02/14/2014   Hypertension 02/14/2014   1. Obstructive chronic bronchitis without  exacerbation ([place]) She has moderate COPD she is on breztri should be continued and albuterol for rescue  2. Obesity, morbid ([place]) Obesity Counseling: Had a lengthy discussion regarding patients BMI and weight issues. Patient was instructed on portion control as well as increased activity. Also discussed caloric restrictions with trying to maintain  intake less than 2000 Kcal. Discussions were made in accordance with the 5As of weight management. Simple actions such as not eating late and if able to, taking a walk is suggested.   3. OSA (obstructive sleep apnea) On CPAP therapy will be continued  4. CPAP use counseling CPAP Counseling: had a lengthy discussion with the patient regarding the importance of PAP therapy in management of the sleep apnea. Patient appears to understand the risk factor reduction and also understands the risks associated with untreated sleep apnea. Patient will try to make a good faith effort to remain compliant with therapy. Also instructed the patient on proper cleaning of the device including the water must be changed daily if possible and use of distilled water is preferred. Patient understands that the machine should be regularly cleaned with appropriate recommended cleaning solutions that do not damage the PAP machine for example given white vinegar and water rinses. Other methods such as ozone treatment may not be as good as these simple methods to achieve cleaning.   5. Lupus ([place]) She will continue with her primary Rheumatologist - CT Chest High Resolution; Future  6. Shortness of breath  - ECHOCARDIOGRAM COMPLETE; Future - CT Chest High Resolution; Future   General Counseling: I have discussed the findings of the evaluation and examination with Alejandro.  I have also discussed any further diagnostic evaluation thatmay be needed or ordered today. Rifka verbalizes understanding of the findings of todays visit. We also reviewed her medications today and discussed drug interactions and side effects including but not limited excessive drowsiness and altered mental states. We also discussed that there is always a risk not just to her but also people around her. she has been encouraged to call the office with any questions or concerns that should arise related to todays visit.  No orders of the defined types were  placed in this encounter.    Time spent: 28  I have personally obtained a history, examined the patient, evaluated laboratory and imaging results, formulated the assessment and plan and placed orders.    Allyne Gee, MD [facility] Pulmonary and Critical Care Sleep medicine

## 2022-07-01 NOTE — Patient Instructions (Signed)

## 2022-07-02 DIAGNOSIS — Z79891 Long term (current) use of opiate analgesic: Secondary | ICD-10-CM | POA: Diagnosis not present

## 2022-07-02 DIAGNOSIS — G894 Chronic pain syndrome: Secondary | ICD-10-CM | POA: Diagnosis not present

## 2022-07-03 ENCOUNTER — Other Ambulatory Visit: Payer: Self-pay | Admitting: Dermatology

## 2022-07-03 DIAGNOSIS — L700 Acne vulgaris: Secondary | ICD-10-CM

## 2022-07-09 ENCOUNTER — Ambulatory Visit
Admission: RE | Admit: 2022-07-09 | Discharge: 2022-07-09 | Disposition: A | Discharge status: Home / Self Care | Payer: Medicare Other | Source: Ambulatory Visit | Attending: Internal Medicine | Admitting: Internal Medicine

## 2022-07-09 DIAGNOSIS — R590 Localized enlarged lymph nodes: Secondary | ICD-10-CM | POA: Diagnosis not present

## 2022-07-09 DIAGNOSIS — R918 Other nonspecific abnormal finding of lung field: Secondary | ICD-10-CM | POA: Diagnosis not present

## 2022-07-09 DIAGNOSIS — G4730 Sleep apnea, unspecified: Secondary | ICD-10-CM | POA: Diagnosis not present

## 2022-07-09 DIAGNOSIS — M329 Systemic lupus erythematosus, unspecified: Secondary | ICD-10-CM | POA: Diagnosis not present

## 2022-07-09 DIAGNOSIS — R0602 Shortness of breath: Secondary | ICD-10-CM | POA: Diagnosis not present

## 2022-07-09 DIAGNOSIS — J439 Emphysema, unspecified: Secondary | ICD-10-CM | POA: Diagnosis not present

## 2022-07-11 ENCOUNTER — Telehealth: Payer: Self-pay

## 2022-07-11 NOTE — Telephone Encounter (Signed)
Pt informed of CT and will discuss further at next visit

## 2022-07-11 NOTE — Telephone Encounter (Signed)
-----   Message from Allyne Gee, MD sent at 07/10/2022  7:18 PM EDT ----- CT looks good wil discuss at next visit in detail

## 2022-07-22 ENCOUNTER — Ambulatory Visit: Payer: Medicare Other

## 2022-07-22 DIAGNOSIS — R0602 Shortness of breath: Secondary | ICD-10-CM

## 2022-07-25 ENCOUNTER — Ambulatory Visit (INDEPENDENT_AMBULATORY_CARE_PROVIDER_SITE_OTHER): Payer: Medicare Other

## 2022-07-25 DIAGNOSIS — E538 Deficiency of other specified B group vitamins: Secondary | ICD-10-CM | POA: Diagnosis not present

## 2022-07-25 MED ORDER — CYANOCOBALAMIN 1000 MCG/ML IJ SOLN
1000.0000 ug | Freq: Once | INTRAMUSCULAR | Status: AC
  Administered 2022-07-25: 1000 ug via INTRAMUSCULAR

## 2022-07-27 ENCOUNTER — Other Ambulatory Visit: Payer: Self-pay | Admitting: Dermatology

## 2022-07-27 DIAGNOSIS — L930 Discoid lupus erythematosus: Secondary | ICD-10-CM

## 2022-07-30 DIAGNOSIS — G894 Chronic pain syndrome: Secondary | ICD-10-CM | POA: Diagnosis not present

## 2022-07-30 DIAGNOSIS — Z79891 Long term (current) use of opiate analgesic: Secondary | ICD-10-CM | POA: Diagnosis not present

## 2022-08-01 ENCOUNTER — Encounter: Payer: Self-pay | Admitting: Internal Medicine

## 2022-08-01 ENCOUNTER — Ambulatory Visit (INDEPENDENT_AMBULATORY_CARE_PROVIDER_SITE_OTHER): Payer: Medicare Other | Admitting: Internal Medicine

## 2022-08-01 VITALS — BP 120/81 | HR 73 | Temp 98.0°F | Resp 16 | Ht 66.0 in | Wt 230.4 lb

## 2022-08-01 DIAGNOSIS — J4489 Other specified chronic obstructive pulmonary disease: Secondary | ICD-10-CM

## 2022-08-01 DIAGNOSIS — J449 Chronic obstructive pulmonary disease, unspecified: Secondary | ICD-10-CM | POA: Diagnosis not present

## 2022-08-01 DIAGNOSIS — I515 Myocardial degeneration: Secondary | ICD-10-CM

## 2022-08-01 DIAGNOSIS — R599 Enlarged lymph nodes, unspecified: Secondary | ICD-10-CM | POA: Diagnosis not present

## 2022-08-01 DIAGNOSIS — Z7189 Other specified counseling: Secondary | ICD-10-CM | POA: Diagnosis not present

## 2022-08-01 DIAGNOSIS — G4733 Obstructive sleep apnea (adult) (pediatric): Secondary | ICD-10-CM

## 2022-08-01 DIAGNOSIS — M329 Systemic lupus erythematosus, unspecified: Secondary | ICD-10-CM

## 2022-08-01 DIAGNOSIS — IMO0002 Reserved for concepts with insufficient information to code with codable children: Secondary | ICD-10-CM

## 2022-08-01 NOTE — Progress Notes (Signed)
[facility] [place], [place] [address]  Pulmonary Sleep Medicine   Office Visit Note  Patient Name: Morgan Kent DOB: Oct 29, 1981 MRN [id number]  Date of Service: 08/01/2022  Complaints/HPI: She had a CT chest done shows what appears to be fat along the heart border. Concern for possible liposarcoma (less likely). She did have a lymph enlarged also and she is supposed to go to the breast clinic for evaluation. Patient should be referred for oncology opinion also for completeness. OSA doing well with current therapy.  ROS  General: (-) fever, (-) chills, (-) night sweats, (-) weakness Skin: (-) rashes, (-) itching,. Eyes: (-) visual changes, (-) redness, (-) itching. Nose and Sinuses: (-) nasal stuffiness or itchiness, (-) postnasal drip, (-) nosebleeds, (-) sinus trouble. Mouth and Throat: (-) sore throat, (-) hoarseness. Neck: (-) swollen glands, (-) enlarged thyroid, (-) neck pain. Respiratory: - cough, (-) bloody sputum, - shortness of breath, - wheezing. Cardiovascular: - ankle swelling, (-) chest pain. Lymphatic: (-) lymph node enlargement. Neurologic: (-) numbness, (-) tingling. Psychiatric: (-) anxiety, (-) depression   Current Medication: Outpatient Encounter Medications as of 08/01/2022  Medication Sig   albuterol (VENTOLIN HFA) 108 (90 Base) MCG/ACT inhaler Inhale 2 puffs into the lungs daily.   amLODipine (NORVASC) 5 MG tablet TAKE 1 TABLET(5 MG) BY MOUTH DAILY   Budeson-Glycopyrrol-Formoterol (BREZTRI AEROSPHERE) 160-9-4.8 MCG/ACT AERO Inhale 2 puffs into the lungs 2 (two) times daily.   carvedilol (COREG) 25 MG tablet TAKE 1 TABLET(25 MG) BY MOUTH TWICE DAILY   cetirizine (ZYRTEC) 10 MG tablet Take 1 tablet (10 mg total) by mouth daily.   clobetasol (TEMOVATE) 0.05 % external solution Apply 1 application. topically 2 (two) times daily as needed. Apply to scalp   cyclobenzaprine (FLEXERIL) 10 MG tablet Take 1 tablet (10 mg total) by mouth 3  (three) times daily as needed.   Dapsone 7.5 % GEL APPLY TOPICALLY TO THE AFFECTED AREA DAILY   diclofenac Sodium (VOLTAREN) 1 % GEL Apply 2 g topically 4 (four) times daily.   ergocalciferol (DRISDOL) 1.25 MG (50000 UT) capsule Take one cap q week   ferrous sulfate (FERROUSUL) 325 (65 FE) MG tablet Take 1 tablet (325 mg total) by mouth daily with breakfast.   fluconazole (DIFLUCAN) 150 MG tablet Take one tab a day for 3 days   fluconazole (DIFLUCAN) 200 MG tablet Take one pill once a week for 2 weeks   fluticasone (FLONASE) 50 MCG/ACT nasal spray Place 1 spray into both nostrils at bedtime.   folic acid (FOLVITE) 1 MG tablet Take 1 mg by mouth daily.   gabapentin (NEURONTIN) 300 MG capsule TAKE 1 CAPSULE(300 MG) BY MOUTH THREE TIMES DAILY   hydrochlorothiazide (HYDRODIURIL) 12.5 MG tablet TAKE 1 TABLET(12.5 MG) BY MOUTH DAILY   hydroxychloroquine (PLAQUENIL) 200 MG tablet Take 1 tablet (200 mg total) by mouth daily.   JUNEL 1.5/30 1.5-30 MG-MCG tablet TAKE 1 TABLET BY MOUTH DAILY   ketoconazole (NIZORAL) 2 % shampoo APPLY 1 APPLICATION TOPICALLY EVERY 3 DAYS. LET SIT 5 MINUTES BEFORE RINSING OUT   ketoconazole (NIZORAL) 2 % shampoo 1-2 times a week lather on scalp, leave on 5-8 minutes, rinse well   lisinopril (ZESTRIL) 5 MG tablet TAKE 1 TABLET(5 MG) BY MOUTH DAILY   meloxicam (MOBIC) 7.5 MG tablet Take 1 tablet (7.5 mg total) by mouth 2 (two) times daily.   methotrexate (RHEUMATREX) 2.5 MG tablet Take 2.5 mg by mouth once a week. Caution:Chemotherapy. Protect from light.  mometasone (ELOCON) 0.1 % ointment APPLY TOPICALLY TO SORES ON FACE DAILY AS NEEDED FOR FLARES   montelukast (SINGULAIR) 10 MG tablet Take 1 tablet (10 mg total) by mouth at bedtime.   mupirocin ointment (BACTROBAN) 2 % Apply to open area on skin qd-bid   NON FORMULARY CPAP with AHP   nystatin (MYCOSTATIN) 100000 UNIT/ML suspension Take 5 mLs (500,000 Units total) by mouth 4 (four) times daily.   oxyCODONE-acetaminophen  (PERCOCET) 7.5-325 MG tablet Take 1 tablet by mouth every 4 (four) hours as needed for severe pain.   Polyethyl Glycol-Propyl Glycol (SYSTANE ULTRA) 0.4-0.3 % SOLN Place 1 drop into both eyes daily as needed (for dry eyes).    PULMICORT FLEXHALER 90 MCG/ACT inhaler INHALE 2 PUFFS INTO THE LUNGS TWICE DAILY   tacrolimus (PROTOPIC) 0.1 % ointment APPLY TOPICALLY TO THE AFFECTED AREA DAILY   triamcinolone cream (KENALOG) 0.1 % Apply 1 Application topically 2 (two) times daily as needed. Use up to 5 days a week as needed   No facility-administered encounter medications on file as of 08/01/2022.    Surgical History: Past Surgical History:  Procedure Laterality Date   carpel tunnel  Left 2014   CRANIOTOMY  14,06   chiari   DENTAL SURGERY     HERNIA REPAIR     umbilical   PLACEMENT OF LUMBAR DRAIN N/A 07/07/2014   Procedure: PLACEMENT OF LUMBAR DRAIN AND CLOSURE OF CERVICAL INCISION;  Surgeon: Newman Pies, MD;  Location: [place] [facility];  Service: Neurosurgery;  Laterality: N/A;   SUBOCCIPITAL CRANIECTOMY CERVICAL LAMINECTOMY N/A 09/29/2013   Procedure: SUBOCCIPITAL CRANIECTOMY CERVICAL LAMINECTOMY/DURAPLASTY;  Surgeon: Ophelia Charter, MD;  Location: [place] [facility];  Service: Neurosurgery;  Laterality: N/A;  posterior   SUBOCCIPITAL CRANIECTOMY CERVICAL LAMINECTOMY N/A 06/23/2014   Procedure: SUBOCCIPITAL CRANIECTOMY CERVICAL LAMINECTOMY/DURAPLASTY;  Surgeon: Newman Pies, MD;  Location: [place] [facility];  Service: Neurosurgery;  Laterality: N/A;  suboccipital craniectomy with cervical laminectomy and duraplasty    Medical History: Past Medical History:  Diagnosis Date   Anxiety    Arthritis    rheumo possible   Chiari malformation    Depression    Dizziness    Headache(784.0)    History of kidney stones    Hypertension    Lupus (HCC)    Lupus (HCC)    Nerve damage    NECK   PONV (postoperative nausea and vomiting)    nausea only   Sleep apnea    TMJ arthritis     Family  History: Family History  Problem Relation Age of Onset   Heart disease Father    Hypertension Father    Hypertension Mother    Thyroid disease Mother    Asthma Other    Depression Other    Diabetes Other    Migraines Other    Stroke Other     Social History: Social History   Socioeconomic History   Marital status: Single    Spouse name: Not on file   Number of children: Not on file   Years of education: Not on file   Highest education level: Not on file  Occupational History   Not on file  Tobacco Use   Smoking status: Never   Smokeless tobacco: Never  Vaping Use   Vaping Use: Never used  Substance and Sexual Activity   Alcohol use: No    Alcohol/week: 0.0 standard drinks of alcohol   Drug use: No   Sexual activity: Not on file  Other Topics Concern  Not on file  Social History Narrative   Lives with 3 children in 2 story home.  Unemployed.  Trying to get disability.  Education: high school.   Social Determinants of Health   Financial Resource Strain: Not on file  Food Insecurity: Not on file  Transportation Needs: Not on file  Physical Activity: Not on file  Stress: Not on file  Social Connections: Not on file  Intimate Partner Violence: Not on file    Vital Signs: Blood pressure 120/81, pulse 73, temperature 98 F (36.7 C), resp. rate 16, height 5' 6"  (1.676 m), weight 230 lb 6.4 oz (104.5 kg), SpO2 100 %.  Examination: General Appearance: The patient is well-developed, well-nourished, and in no distress. Skin: Gross inspection of skin unremarkable. Head: normocephalic, no gross deformities. Eyes: no gross deformities noted. ENT: ears appear grossly normal no exudates. Neck: Supple. No thyromegaly. No LAD. Respiratory: no rhonchi noted. Cardiovascular: Normal S1 and S2 without murmur or rub. Extremities: No cyanosis. pulses are equal. Neurologic: Alert and oriented. No involuntary movements.  LABS: Recent Results (from the past 2160 hour(s))   Lipid Panel With LDL/HDL Ratio     Status: Abnormal   Collection Time: 06/12/22  8:28 AM  Result Value Ref Range   Cholesterol, Total 167 100 - 199 mg/dL   Triglycerides 274 (H) 0 - 149 mg/dL   HDL 34 (L) >39 mg/dL   VLDL Cholesterol Cal 46 (H) 5 - 40 mg/dL   LDL Chol Calc (NIH) 87 0 - 99 mg/dL   LDL/HDL Ratio 2.6 0.0 - 3.2 ratio    Comment:                                     LDL/HDL Ratio                                             Men  Women                               1/2 Avg.Risk  1.0    1.5                                   Avg.Risk  3.6    3.2                                2X Avg.Risk  6.2    5.0                                3X Avg.Risk  8.0    6.1   TSH + free T4     Status: None   Collection Time: 06/12/22  8:28 AM  Result Value Ref Range   TSH 1.290 0.450 - 4.500 uIU/mL   Free T4 1.43 0.82 - 1.77 ng/dL  Comprehensive metabolic panel     Status: Abnormal   Collection Time: 06/12/22  8:28 AM  Result Value Ref Range   Glucose 66 (L) 70 - 99 mg/dL   BUN 6 6 - 24 mg/dL  Creatinine, Ser 0.91 0.57 - 1.00 mg/dL   eGFR 81 >59 mL/min/1.73   BUN/Creatinine Ratio 7 (L) 9 - 23   Sodium 136 134 - 144 mmol/L   Potassium 4.5 3.5 - 5.2 mmol/L   Chloride 97 96 - 106 mmol/L   CO2 23 20 - 29 mmol/L   Calcium 9.6 8.7 - 10.2 mg/dL   Total Protein 8.1 6.0 - 8.5 g/dL   Albumin 4.2 3.9 - 4.9 g/dL   Globulin, Total 3.9 1.5 - 4.5 g/dL   Albumin/Globulin Ratio 1.1 (L) 1.2 - 2.2   Bilirubin Total 0.3 0.0 - 1.2 mg/dL   Alkaline Phosphatase 53 44 - 121 IU/L   AST 17 0 - 40 IU/L   ALT 8 0 - 32 IU/L  B12 and Folate Panel     Status: None   Collection Time: 06/12/22  8:28 AM  Result Value Ref Range   Vitamin B-12 334 232 - 1,245 pg/mL   Folate >20.0 >3.0 ng/mL    Comment: A serum folate concentration of less than 3.1 ng/mL is considered to represent clinical deficiency.   Fe+TIBC+Fer     Status: Abnormal   Collection Time: 06/12/22  8:28 AM  Result Value Ref Range   Total Iron  Binding Capacity 335 250 - 450 ug/dL   UIBC 269 131 - 425 ug/dL   Iron 66 27 - 159 ug/dL   Iron Saturation 20 15 - 55 %   Ferritin 263 (H) 15 - 150 ng/mL  VITAMIN D 25 Hydroxy (Vit-D Deficiency, Fractures)     Status: Abnormal   Collection Time: 06/12/22  8:28 AM  Result Value Ref Range   Vit D, 25-Hydroxy 19.5 (L) 30.0 - 100.0 ng/mL    Comment: Vitamin D deficiency has been defined by the [place] practice guideline as a level of serum 25-OH vitamin D less than 20 ng/mL (1,2). The Endocrine Society went on to further define vitamin D insufficiency as a level between 21 and 29 ng/mL (2). 1. IOM (Institute of Medicine). 2010. Dietary reference    intakes for calcium and D. [place]: The    [employer]. 2. Holick MF, Binkley Panaca, Bischoff-Ferrari HA, et al.    Evaluation, treatment, and prevention of vitamin D    deficiency: an Endocrine Society clinical practice    guideline. JCEM. 2011 Jul; 96(7):1911-30.   UA/M w/rflx Culture, Routine     Status: None   Collection Time: 06/13/22 11:30 AM   Specimen: Urine   Urine  Result Value Ref Range   Specific Gravity, UA 1.011 1.005 - 1.030   pH, UA 6.0 5.0 - 7.5   Color, UA Yellow Yellow   Appearance Ur Clear Clear   Leukocytes,UA Negative Negative   Protein,UA Negative Negative/Trace   Glucose, UA Negative Negative   Ketones, UA Negative Negative   RBC, UA Negative Negative   Bilirubin, UA Negative Negative   Urobilinogen, Ur 0.2 0.2 - 1.0 mg/dL   Nitrite, UA Negative Negative   Microscopic Examination Comment     Comment: Microscopic follows if indicated.   Microscopic Examination See below:     Comment: Microscopic was indicated and was performed.   Urinalysis Reflex Comment     Comment: This specimen will not reflex to a Urine Culture.  Microscopic Examination     Status: None   Collection Time: 06/13/22 11:30 AM   Urine  Result Value Ref Range   WBC, UA None seen 0 -  5  /hpf   RBC, Urine 0-2 0 - 2 /hpf   Epithelial Cells (non renal) None seen 0 - 10 /hpf   Casts None seen None seen /lpf   Bacteria, UA None seen None seen/Few  Pulmonary Function Test     Status: None   Collection Time: 06/28/22  2:27 PM  Result Value Ref Range   FEV1     FVC     FEV1/FVC     TLC     DLCO      Radiology: CT Chest High Resolution  Result Date: 07/10/2022 CLINICAL DATA:  Shortness of breath on exertion. Lupus. Sleep apnea. EXAM: CT CHEST WITHOUT CONTRAST TECHNIQUE: Multidetector CT imaging of the chest was performed following the standard protocol without intravenous contrast. High resolution imaging of the lungs, as well as inspiratory and expiratory imaging, was performed. RADIATION DOSE REDUCTION: This exam was performed according to the departmental dose-optimization program which includes automated exposure control, adjustment of the mA and/or kV according to patient size and/or use of iterative reconstruction technique. COMPARISON:  None Available. FINDINGS: Cardiovascular: Heart size normal. No pericardial effusion. Focal encapsulated fat with minimal internal wispy soft tissue along the right heart border measures 1.7 x 3.8 cm. Mediastinum/Nodes: No pathologically enlarged mediastinal lymph nodes. Hilar regions are difficult to definitively evaluate without IV contrast. Numerous subpectoral and axillary lymph nodes, measuring up to 10 mm bilaterally. Internal jugular lymph nodes are not enlarged by CT size criteria. Esophagus is grossly unremarkable. Lungs/Pleura: Blebs are seen at the lung apices. Minimal scattered peripheral peribronchovascular ground-glass nodularity. Discrete pulmonary nodules measure up to 4 mm in the posteromedial right lower lobe (8/106). Negative for subpleural reticulation, traction bronchiectasis/bronchiolectasis, ground-glass, architectural distortion or honeycombing. No pleural fluid. Airway is unremarkable. Expiratory phase imaging was not  performed in true expiration, limiting the evaluation for air trapping. Upper Abdomen: Visualized portions of the liver, adrenal glands, kidneys, spleen, pancreas, stomach and bowel are grossly unremarkable. No upper abdominal adenopathy. Musculoskeletal: Degenerative changes in the spine. No worrisome lytic or sclerotic lesions. IMPRESSION: 1. No evidence of interstitial lung disease. 2. Focal encapsulated fat along the right heart border with minimal internal wispy soft tissue, likely a lipoma. Follow-up could be obtained in 6-12 months to ensure stability, as low-grade liposarcoma cannot be definitively excluded. 3. Numerous bilateral subpectoral and axillary lymph nodes, 10 mm or less in size. Difficult to exclude a lymphoproliferative disorder. 4. Scattered peripheral peribronchovascular ground-glass nodularity, possibly postinfectious in etiology. 5. Pulmonary nodules measure 4 mm or less in size. No follow-up needed if patient is low-risk (and has no known or suspected primary neoplasm). Non-contrast chest CT can be considered in 12 months if patient is high-risk. This recommendation follows the consensus statement: Guidelines for Management of Incidental Pulmonary Nodules Detected on CT Images: From the Fleischner Society 2017; Radiology 2017; 284:228-243. Electronically Signed   By: Lorin Picket M.D.   On: 07/10/2022 08:29    No results found.  CT Chest High Resolution  Result Date: 07/10/2022 CLINICAL DATA:  Shortness of breath on exertion. Lupus. Sleep apnea. EXAM: CT CHEST WITHOUT CONTRAST TECHNIQUE: Multidetector CT imaging of the chest was performed following the standard protocol without intravenous contrast. High resolution imaging of the lungs, as well as inspiratory and expiratory imaging, was performed. RADIATION DOSE REDUCTION: This exam was performed according to the departmental dose-optimization program which includes automated exposure control, adjustment of the mA and/or kV  according to patient size and/or use of iterative reconstruction technique. COMPARISON:  None Available. FINDINGS: Cardiovascular: Heart size normal. No pericardial effusion. Focal encapsulated fat with minimal internal wispy soft tissue along the right heart border measures 1.7 x 3.8 cm. Mediastinum/Nodes: No pathologically enlarged mediastinal lymph nodes. Hilar regions are difficult to definitively evaluate without IV contrast. Numerous subpectoral and axillary lymph nodes, measuring up to 10 mm bilaterally. Internal jugular lymph nodes are not enlarged by CT size criteria. Esophagus is grossly unremarkable. Lungs/Pleura: Blebs are seen at the lung apices. Minimal scattered peripheral peribronchovascular ground-glass nodularity. Discrete pulmonary nodules measure up to 4 mm in the posteromedial right lower lobe (8/106). Negative for subpleural reticulation, traction bronchiectasis/bronchiolectasis, ground-glass, architectural distortion or honeycombing. No pleural fluid. Airway is unremarkable. Expiratory phase imaging was not performed in true expiration, limiting the evaluation for air trapping. Upper Abdomen: Visualized portions of the liver, adrenal glands, kidneys, spleen, pancreas, stomach and bowel are grossly unremarkable. No upper abdominal adenopathy. Musculoskeletal: Degenerative changes in the spine. No worrisome lytic or sclerotic lesions. IMPRESSION: 1. No evidence of interstitial lung disease. 2. Focal encapsulated fat along the right heart border with minimal internal wispy soft tissue, likely a lipoma. Follow-up could be obtained in 6-12 months to ensure stability, as low-grade liposarcoma cannot be definitively excluded. 3. Numerous bilateral subpectoral and axillary lymph nodes, 10 mm or less in size. Difficult to exclude a lymphoproliferative disorder. 4. Scattered peripheral peribronchovascular ground-glass nodularity, possibly postinfectious in etiology. 5. Pulmonary nodules measure 4 mm or  less in size. No follow-up needed if patient is low-risk (and has no known or suspected primary neoplasm). Non-contrast chest CT can be considered in 12 months if patient is high-risk. This recommendation follows the consensus statement: Guidelines for Management of Incidental Pulmonary Nodules Detected on CT Images: From the Fleischner Society 2017; Radiology 2017; 284:228-243. Electronically Signed   By: Lorin Picket M.D.   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   Candidiasis 12/08/2018   Body mass index 40.0-44.9, adult ([place]) 12/08/2018   Impaired fasting glucose 09/07/2018   Other fatigue 09/07/2018   Vitamin D deficiency 09/07/2018   Encounter for annual general medical examination with abnormal findings in adult 05/28/2018   Screening for malignant neoplasm of cervix 05/28/2018   Dysuria 05/28/2018   Allergic rhinitis 04/16/2018   Acute recurrent sinusitis 03/20/2018   Tachycardia 08/20/2016   Shortness of breath 08/20/2016   Congestive dilated cardiomyopathy ([place]) 11/22/2015   Lupus ([place]) 11/22/2015   Moderate obesity 11/22/2015   OSA (obstructive sleep apnea) 11/22/2015   Essential hypertension 16/60/6301   Chronic systolic CHF (congestive heart failure) ([place]) 08/21/2015   Numbness and tingling 08/04/2014   Chiari malformation 08/04/2014   Sleep disorder 08/04/2014   Postprocedural pseudomeningocele 07/07/2014   Chiari malformation type I ([place]) 06/23/2014   Carpal tunnel  syndrome 02/14/2014   Depression 02/14/2014   Neuralgia 02/14/2014   Migraines 02/14/2014   Sleep apnea 02/14/2014   Systemic lupus erythematosus ([place]) 02/14/2014   Hypertension 02/14/2014    1. OSA (obstructive sleep apnea) Continue with CPAP therapy patient has  been tolerating it relatively well.  2. Obesity, morbid ([place]) Obesity Counseling: Had a lengthy discussion regarding patients BMI and weight issues. Patient was instructed on portion control as well as increased activity. Also discussed caloric restrictions with trying to maintain intake less than 2000 Kcal. Discussions were made in accordance with the 5As of weight management. Simple actions such as not eating late and if able to, taking a walk is suggested.   3. Obstructive chronic bronchitis without exacerbation ([place]) Patient has clear lungs today.  Plan is going to continue with current management  4. Lupus ([place]) Lupus under control we will continue supportive care  5. CPAP use counseling CPAP Counseling: had a lengthy discussion with the patient regarding the importance of PAP therapy in management of the sleep apnea. Patient appears to understand the risk factor reduction and also understands the risks associated with untreated sleep apnea. Patient will try to make a good faith effort to remain compliant with therapy. Also instructed the patient on proper cleaning of the device including the water must be changed daily if possible and use of distilled water is preferred. Patient understands that the machine should be regularly cleaned with appropriate recommended cleaning solutions that do not damage the PAP machine for example given white vinegar and water rinses. Other methods such as ozone treatment may not be as good as these simple methods to achieve cleaning.   6. Lymph node enlargement  - Ambulatory referral to Hematology / Oncology - CT Chest Wo Contrast; Future     General Counseling: I have discussed the  findings of the evaluation and examination with Halle.  I have also discussed any further diagnostic evaluation thatmay be needed or ordered today. Dynisha verbalizes understanding of the findings of todays visit. We also reviewed her medications today and discussed drug interactions and side effects including but not limited excessive drowsiness and altered mental states. We also discussed that there is always a risk not just to her but also people around her. she has been encouraged to call the office with any questions or concerns that should arise related to todays visit.  No orders of the defined types were placed in this encounter.    Time spent: 23  I have personally obtained a history, examined the patient, evaluated laboratory and imaging results, formulated the assessment and plan and placed orders.    Allyne Gee, MD [facility] Pulmonary and Critical Care Sleep medicine

## 2022-08-06 ENCOUNTER — Other Ambulatory Visit: Payer: Self-pay | Admitting: Cardiovascular Disease

## 2022-08-06 NOTE — Telephone Encounter (Signed)
Please contact pt for future appointment. Pt due for 12 month f/u. 

## 2022-08-07 NOTE — Telephone Encounter (Signed)
Rx refill sent to pharmacy. 

## 2022-08-08 ENCOUNTER — Other Ambulatory Visit: Payer: Self-pay | Admitting: Cardiovascular Disease

## 2022-08-15 ENCOUNTER — Inpatient Hospital Stay: Payer: Medicare Other | Admitting: Internal Medicine

## 2022-08-15 ENCOUNTER — Inpatient Hospital Stay: Payer: Medicare Other

## 2022-08-19 ENCOUNTER — Inpatient Hospital Stay: Payer: Medicare Other

## 2022-08-19 ENCOUNTER — Telehealth (INDEPENDENT_AMBULATORY_CARE_PROVIDER_SITE_OTHER): Payer: Medicare Other | Admitting: Physician Assistant

## 2022-08-19 ENCOUNTER — Encounter: Payer: Self-pay | Admitting: Internal Medicine

## 2022-08-19 ENCOUNTER — Inpatient Hospital Stay: Payer: Medicare Other | Attending: Internal Medicine | Admitting: Internal Medicine

## 2022-08-19 ENCOUNTER — Other Ambulatory Visit: Payer: Self-pay | Admitting: *Deleted

## 2022-08-19 ENCOUNTER — Encounter: Payer: Self-pay | Admitting: Physician Assistant

## 2022-08-19 VITALS — BP 121/72 | HR 85 | Temp 97.8°F | Resp 18 | Wt 223.0 lb

## 2022-08-19 VITALS — Resp 16 | Ht 66.0 in

## 2022-08-19 DIAGNOSIS — Z79621 Long term (current) use of calcineurin inhibitor: Secondary | ICD-10-CM | POA: Diagnosis not present

## 2022-08-19 DIAGNOSIS — J029 Acute pharyngitis, unspecified: Secondary | ICD-10-CM

## 2022-08-19 DIAGNOSIS — R590 Localized enlarged lymph nodes: Secondary | ICD-10-CM | POA: Diagnosis not present

## 2022-08-19 DIAGNOSIS — Z7951 Long term (current) use of inhaled steroids: Secondary | ICD-10-CM | POA: Insufficient documentation

## 2022-08-19 DIAGNOSIS — H9202 Otalgia, left ear: Secondary | ICD-10-CM

## 2022-08-19 DIAGNOSIS — J4489 Other specified chronic obstructive pulmonary disease: Secondary | ICD-10-CM | POA: Insufficient documentation

## 2022-08-19 DIAGNOSIS — G4733 Obstructive sleep apnea (adult) (pediatric): Secondary | ICD-10-CM | POA: Diagnosis not present

## 2022-08-19 DIAGNOSIS — Z79899 Other long term (current) drug therapy: Secondary | ICD-10-CM | POA: Insufficient documentation

## 2022-08-19 DIAGNOSIS — R222 Localized swelling, mass and lump, trunk: Secondary | ICD-10-CM

## 2022-08-19 LAB — COMPREHENSIVE METABOLIC PANEL
ALT: 16 U/L (ref 0–44)
AST: 23 U/L (ref 15–41)
Albumin: 3.7 g/dL (ref 3.5–5.0)
Alkaline Phosphatase: 41 U/L (ref 38–126)
Anion gap: 7 (ref 5–15)
BUN: 12 mg/dL (ref 6–20)
CO2: 24 mmol/L (ref 22–32)
Calcium: 9 mg/dL (ref 8.9–10.3)
Chloride: 106 mmol/L (ref 98–111)
Creatinine, Ser: 1.24 mg/dL — ABNORMAL HIGH (ref 0.44–1.00)
GFR, Estimated: 56 mL/min — ABNORMAL LOW (ref >60)
Glucose, Bld: 82 mg/dL (ref 70–99)
Potassium: 4.3 mmol/L (ref 3.5–5.1)
Sodium: 137 mmol/L (ref 135–145)
Total Bilirubin: 0.5 mg/dL (ref 0.3–1.2)
Total Protein: 8 g/dL (ref 6.5–8.1)

## 2022-08-19 LAB — LACTATE DEHYDROGENASE: LDH: 140 U/L (ref 98–192)

## 2022-08-19 LAB — CBC WITH DIFFERENTIAL/PLATELET
Abs Immature Granulocytes: 0.02 10*3/uL (ref 0.00–0.07)
Basophils Absolute: 0 10*3/uL (ref 0.0–0.1)
Basophils Relative: 0 %
Eosinophils Absolute: 0.1 10*3/uL (ref 0.0–0.5)
Eosinophils Relative: 2 %
HCT: 29.4 % — ABNORMAL LOW (ref 36.0–46.0)
Hemoglobin: 10.1 g/dL — ABNORMAL LOW (ref 12.0–15.0)
Immature Granulocytes: 0 %
Lymphocytes Relative: 21 %
Lymphs Abs: 1.2 10*3/uL (ref 0.7–4.0)
MCH: 31.6 pg (ref 26.0–34.0)
MCHC: 34.4 g/dL (ref 30.0–36.0)
MCV: 91.9 fL (ref 80.0–100.0)
Monocytes Absolute: 0.5 10*3/uL (ref 0.1–1.0)
Monocytes Relative: 8 %
Neutro Abs: 4 10*3/uL (ref 1.7–7.7)
Neutrophils Relative %: 69 %
Platelets: 308 10*3/uL (ref 150–400)
RBC: 3.2 MIL/uL — ABNORMAL LOW (ref 3.87–5.11)
RDW: 13.8 % (ref 11.5–15.5)
WBC: 5.9 10*3/uL (ref 4.0–10.5)
nRBC: 0 % (ref 0.0–0.2)

## 2022-08-19 MED ORDER — AMOXICILLIN-POT CLAVULANATE 875-125 MG PO TABS: 1.0000 | ORAL_TABLET | Freq: Two times a day (BID) | ORAL | 0 refills | Status: DC

## 2022-08-19 NOTE — Progress Notes (Signed)
[facility] [place], [place] [address]  Internal MEDICINE  Telephone Visit  Patient Name: Morgan Kent  [id number]  [id number]  Date of Service: 08/19/2022  I connected with the patient at 10:42 by telephone and verified the patients identity using two identifiers.   I discussed the limitations, risks, security and privacy concerns of performing an evaluation and management service by telephone and the availability of in person appointments. I also discussed with the patient that there may be a patient responsible charge related to the service.  The patient expressed understanding and agrees to proceed.    Chief Complaint  Patient presents with   Telephone Assessment    [phone], prefers telephone call   Telephone Screen   Sore Throat    Ongoing for 1 week, has tried OTC Mucinex and has tried gargling with salt water   Ear Pain    Left ear pain, NEG for Covid    HPI Pt is here for virtual sick visit -she has had a sore throat for the past week. Hurts to swallow. Stuffy nose. Left ear pain as well. Denies coughing, SOB, wheezing, fevers, or chills. -Taking her inhalers as prescribed, no albuterol needed -Daughter is also sick -as been taking mucinex, throat lozenges, and gargling with salt water. Using nasal spray as well. -Patient states she has taken PCNs previously and has no reaction to these despite reaction to keflex in past. Advised if any reaction to stop and contact office or seek higher care if needed  Current Medication: Outpatient Encounter Medications as of 08/19/2022  Medication Sig   albuterol (VENTOLIN HFA) 108 (90 Base) MCG/ACT inhaler Inhale 2 puffs into the lungs daily.   amLODipine (NORVASC) 5 MG tablet TAKE 1 TABLET(5 MG) BY MOUTH DAILY   amoxicillin-clavulanate (AUGMENTIN) 875-125 MG tablet Take 1 tablet by mouth 2 (two) times daily. Take with food.   Budeson-Glycopyrrol-Formoterol (BREZTRI AEROSPHERE) 160-9-4.8 MCG/ACT AERO  Inhale 2 puffs into the lungs 2 (two) times daily.   carvedilol (COREG) 25 MG tablet TAKE 1 TABLET(25 MG) BY MOUTH TWICE DAILY   cetirizine (ZYRTEC) 10 MG tablet Take 1 tablet (10 mg total) by mouth daily.   clobetasol (TEMOVATE) 0.05 % external solution Apply 1 application. topically 2 (two) times daily as needed. Apply to scalp   cyclobenzaprine (FLEXERIL) 10 MG tablet Take 1 tablet (10 mg total) by mouth 3 (three) times daily as needed.   Dapsone 7.5 % GEL APPLY TOPICALLY TO THE AFFECTED AREA DAILY   diclofenac Sodium (VOLTAREN) 1 % GEL Apply 2 g topically 4 (four) times daily.   ergocalciferol (DRISDOL) 1.25 MG (50000 UT) capsule Take one cap q week   ferrous sulfate (FERROUSUL) 325 (65 FE) MG tablet Take 1 tablet (325 mg total) by mouth daily with breakfast.   fluconazole (DIFLUCAN) 150 MG tablet Take one tab a day for 3 days   fluconazole (DIFLUCAN) 200 MG tablet Take one pill once a week for 2 weeks   fluticasone (FLONASE) 50 MCG/ACT nasal spray Place 1 spray into both nostrils at bedtime.   folic acid (FOLVITE) 1 MG tablet Take 1 mg by mouth daily.   gabapentin (NEURONTIN) 300 MG capsule TAKE 1 CAPSULE(300 MG) BY MOUTH THREE TIMES DAILY   hydrochlorothiazide (HYDRODIURIL) 12.5 MG tablet TAKE 1 TABLET(12.5 MG) BY MOUTH DAILY   hydroxychloroquine (PLAQUENIL) 200 MG tablet Take 1 tablet (200 mg total) by mouth daily.   JUNEL 1.5/30 1.5-30 MG-MCG tablet TAKE 1 TABLET BY MOUTH DAILY  ketoconazole (NIZORAL) 2 % shampoo APPLY 1 APPLICATION TOPICALLY EVERY 3 DAYS. LET SIT 5 MINUTES BEFORE RINSING OUT   ketoconazole (NIZORAL) 2 % shampoo 1-2 times a week lather on scalp, leave on 5-8 minutes, rinse well   lisinopril (ZESTRIL) 5 MG tablet TAKE 1 TABLET(5 MG) BY MOUTH DAILY   meloxicam (MOBIC) 7.5 MG tablet Take 1 tablet (7.5 mg total) by mouth 2 (two) times daily.   methotrexate (RHEUMATREX) 2.5 MG tablet Take 2.5 mg by mouth once a week. Caution:Chemotherapy. Protect from light.   mometasone  (ELOCON) 0.1 % ointment APPLY TOPICALLY TO SORES ON FACE DAILY AS NEEDED FOR FLARES   montelukast (SINGULAIR) 10 MG tablet Take 1 tablet (10 mg total) by mouth at bedtime.   mupirocin ointment (BACTROBAN) 2 % Apply to open area on skin qd-bid   NON FORMULARY CPAP with AHP   nystatin (MYCOSTATIN) 100000 UNIT/ML suspension Take 5 mLs (500,000 Units total) by mouth 4 (four) times daily.   oxyCODONE-acetaminophen (PERCOCET) 7.5-325 MG tablet Take 1 tablet by mouth every 4 (four) hours as needed for severe pain.   Polyethyl Glycol-Propyl Glycol (SYSTANE ULTRA) 0.4-0.3 % SOLN Place 1 drop into both eyes daily as needed (for dry eyes).    PULMICORT FLEXHALER 90 MCG/ACT inhaler INHALE 2 PUFFS INTO THE LUNGS TWICE DAILY   tacrolimus (PROTOPIC) 0.1 % ointment APPLY TOPICALLY TO THE AFFECTED AREA DAILY   triamcinolone cream (KENALOG) 0.1 % Apply 1 Application topically 2 (two) times daily as needed. Use up to 5 days a week as needed   No facility-administered encounter medications on file as of 08/19/2022.    Surgical History: Past Surgical History:  Procedure Laterality Date   carpel tunnel  Left 2014   CRANIOTOMY  14,06   chiari   DENTAL SURGERY     HERNIA REPAIR     umbilical   PLACEMENT OF LUMBAR DRAIN N/A 07/07/2014   Procedure: PLACEMENT OF LUMBAR DRAIN AND CLOSURE OF CERVICAL INCISION;  Surgeon: Newman Pies, MD;  Location: [facility];  Service: Neurosurgery;  Laterality: N/A;   SUBOCCIPITAL CRANIECTOMY CERVICAL LAMINECTOMY N/A 09/29/2013   Procedure: SUBOCCIPITAL CRANIECTOMY CERVICAL LAMINECTOMY/DURAPLASTY;  Surgeon: Ophelia Charter, MD;  Location: [facility];  Service: Neurosurgery;  Laterality: N/A;  posterior   SUBOCCIPITAL CRANIECTOMY CERVICAL LAMINECTOMY N/A 06/23/2014   Procedure: SUBOCCIPITAL CRANIECTOMY CERVICAL LAMINECTOMY/DURAPLASTY;  Surgeon: Newman Pies, MD;  Location: [facility];  Service: Neurosurgery;  Laterality: N/A;  suboccipital craniectomy with cervical  laminectomy and duraplasty    Medical History: Past Medical History:  Diagnosis Date   Anxiety    Arthritis    rheumo possible   Chiari malformation    Depression    Dizziness    Headache(784.0)    History of kidney stones    Hypertension    Lupus (HCC)    Lupus (HCC)    Nerve damage    NECK   PONV (postoperative nausea and vomiting)    nausea only   Sleep apnea    TMJ arthritis     Family History: Family History  Problem Relation Age of Onset   Heart disease Father    Hypertension Father    Hypertension Mother    Thyroid disease Mother    Asthma Other    Depression Other    Diabetes Other    Migraines Other    Stroke Other     Social History   Socioeconomic History   Marital status: Single    Spouse name: Not  on file   Number of children: Not on file   Years of education: Not on file   Highest education level: Not on file  Occupational History   Not on file  Tobacco Use   Smoking status: Never   Smokeless tobacco: Never  Vaping Use   Vaping Use: Never used  Substance and Sexual Activity   Alcohol use: No    Alcohol/week: 0.0 standard drinks of alcohol   Drug use: No   Sexual activity: Not on file  Other Topics Concern   Not on file  Social History Narrative   Lives with 3 children in 2 story home.  Unemployed.  Trying to get disability.  Education: high school.   Social Determinants of Health   Financial Resource Strain: Not on file  Food Insecurity: Not on file  Transportation Needs: Not on file  Physical Activity: Not on file  Stress: Not on file  Social Connections: Not on file  Intimate Partner Violence: Not on file      Review of Systems  Constitutional:  Negative for chills, fatigue and fever.  HENT:  Positive for congestion, ear pain, postnasal drip and sore throat. Negative for mouth sores.   Respiratory:  Negative for cough, shortness of breath and wheezing.   Cardiovascular:  Negative for chest pain.  Genitourinary:   Negative for flank pain.  Psychiatric/Behavioral: Negative.      Vital Signs: Resp 16   Ht '5\' 6"'$  (1.676 m)   BMI 37.19 kg/m    Observation/Objective:  Pt is able to carry out conversation   Assessment/Plan: 1. Sore throat Advised to continue mucinex, nasal spray and salt water gargles. Will start augmentin Bid with Food. Also advised to try throat lozenges and rest and stay well hydrated. - amoxicillin-clavulanate (AUGMENTIN) 875-125 MG tablet; Take 1 tablet by mouth 2 (two) times daily. Take with food.  Dispense: 20 tablet; Refill: 0  2. Left ear pain - amoxicillin-clavulanate (AUGMENTIN) 875-125 MG tablet; Take 1 tablet by mouth 2 (two) times daily. Take with food.  Dispense: 20 tablet; Refill: 0   General Counseling: Gaylia verbalizes understanding of the findings of today's phone visit and agrees with plan of treatment. I have discussed any further diagnostic evaluation that may be needed or ordered today. We also reviewed her medications today. she has been encouraged to call the office with any questions or concerns that should arise related to todays visit.    No orders of the defined types were placed in this encounter.   Meds ordered this encounter  Medications   amoxicillin-clavulanate (AUGMENTIN) 875-125 MG tablet    Sig: Take 1 tablet by mouth 2 (two) times daily. Take with food.    Dispense:  20 tablet    Refill:  0    Time spent:25 Minutes    Dr Lavera Guise Internal medicine

## 2022-08-19 NOTE — Progress Notes (Addendum)
Morgan Kent  [phone]) [phone] Fax:(336) [phone]  ID: Morgan Kent OB: [birth year]  MR#: [id number]  CSN#:[id number]  Patient Care Team: Carolynne Edouard as PCP - General (Physician Assistant) Minna Merritts, MD as Consulting Physician (Cardiology)  REFERRING PROVIDER: Dr. Humphrey Rolls   REASON FOR REFERRAL: enlarged axillary and subpectoral lymph nodes  HPI: Morgan Kent is a 41 y.o. female with past medical history of SLE follows with Dr. Winfield Rast at [facility] on Plaquenil, OSA, Roselie Awkward Chiari malformation status post craniotomy in 2015 was referred to hematology for incidental finding of bilateral enlarged axillary and subpectoral lymph nodes.  Patient follows with Dr. Humphrey Rolls of pulmonary for OSA and obstructive chronic bronchitis. Recently, while undergoing PFTs she was having difficulty breathing.  CT chest was done on 07/09/2022 which showed incidental finding of focal encapsulated fat with minimal internal wispy soft tissue along along the right heart border measuring 1.7 x 3.8 cm.  Follow-up CT in 6 to 12 months was recommended to ensure stability as low-grade liposarcoma cannot be definitely ruled out. Also, numerous subpectoral and axillary lymph nodes measuring up to 10 mm bilaterally were seen.  Patient denies fever, chills, nausea, vomiting, night sweats, cough, abdominal pain, bleeding, bowel or bladder issues. Energy level is good.  Appetite is good.  Denies any weight loss. Denies pain. Denies recent infection or vaccine.  Reports her lupus is generally well controlled.  She has not used steroids for flare for over a year now.   REVIEW OF SYSTEMS:   ROS  As per HPI. Otherwise, a complete review of systems is negative.  PAST MEDICAL HISTORY: Past Medical History:  Diagnosis Date   Anxiety    Arthritis    rheumo possible   Chiari malformation    Depression    Dizziness    Headache(784.0)    History of kidney stones    Hypertension     Lupus (HCC)    Lupus (HCC)    Nerve damage    NECK   PONV (postoperative nausea and vomiting)    nausea only   Sleep apnea    TMJ arthritis     PAST SURGICAL HISTORY: Past Surgical History:  Procedure Laterality Date   carpel tunnel  Left 2014   CRANIOTOMY  14,06   chiari   DENTAL SURGERY     HERNIA REPAIR     umbilical   PLACEMENT OF LUMBAR DRAIN N/A 07/07/2014   Procedure: PLACEMENT OF LUMBAR DRAIN AND CLOSURE OF CERVICAL INCISION;  Surgeon: Newman Pies, MD;  Location: [facility];  Service: Neurosurgery;  Laterality: N/A;   SUBOCCIPITAL CRANIECTOMY CERVICAL LAMINECTOMY N/A 09/29/2013   Procedure: SUBOCCIPITAL CRANIECTOMY CERVICAL LAMINECTOMY/DURAPLASTY;  Surgeon: Ophelia Charter, MD;  Location: [facility];  Service: Neurosurgery;  Laterality: N/A;  posterior   SUBOCCIPITAL CRANIECTOMY CERVICAL LAMINECTOMY N/A 06/23/2014   Procedure: SUBOCCIPITAL CRANIECTOMY CERVICAL LAMINECTOMY/DURAPLASTY;  Surgeon: Newman Pies, MD;  Location: [facility];  Service: Neurosurgery;  Laterality: N/A;  suboccipital craniectomy with cervical laminectomy and duraplasty    FAMILY HISTORY: Family History  Problem Relation Age of Onset   Heart disease Father    Hypertension Father    Hypertension Mother    Thyroid disease Mother    Asthma Other    Depression Other    Diabetes Other    Migraines Other    Stroke Other     HEALTH MAINTENANCE: Social History   Tobacco Use   Smoking status: Never   Smokeless tobacco: Never  Vaping Use   Vaping Use: Never used  Substance Use Topics   Alcohol use: No    Alcohol/week: 0.0 standard drinks of alcohol   Drug use: No     Allergies  Allergen Reactions   Misc. Sulfonamide Containing Compounds    Other Other (See Comments)   Sulfa Antibiotics    Cephalexin Itching    Per [facility] records    Current Outpatient Medications  Medication Sig Dispense Refill   albuterol (VENTOLIN HFA) 108 (90 Base) MCG/ACT inhaler Inhale 2  puffs into the lungs daily. 18 g 5   amLODipine (NORVASC) 5 MG tablet TAKE 1 TABLET(5 MG) BY MOUTH DAILY 90 tablet 3   Budeson-Glycopyrrol-Formoterol (BREZTRI AEROSPHERE) 160-9-4.8 MCG/ACT AERO Inhale 2 puffs into the lungs 2 (two) times daily. 10.7 g 11   carvedilol (COREG) 25 MG tablet TAKE 1 TABLET(25 MG) BY MOUTH TWICE DAILY 60 tablet 0   cetirizine (ZYRTEC) 10 MG tablet Take 1 tablet (10 mg total) by mouth daily. 30 tablet 5   clobetasol (TEMOVATE) 0.05 % external solution Apply 1 application. topically 2 (two) times daily as needed. Apply to scalp 50 mL 2   Cyanocobalamin (B-12 COMPLIANCE INJECTION) 1000 MCG/ML KIT Inject as directed.     cyclobenzaprine (FLEXERIL) 10 MG tablet Take 1 tablet (10 mg total) by mouth 3 (three) times daily as needed. 30 tablet 1   Dapsone 7.5 % GEL APPLY TOPICALLY TO THE AFFECTED AREA DAILY 60 g 6   diclofenac Sodium (VOLTAREN) 1 % GEL Apply 2 g topically 4 (four) times daily. 350 g 2   ergocalciferol (DRISDOL) 1.25 MG (50000 UT) capsule Take one cap q week 12 capsule 3   ferrous sulfate (FERROUSUL) 325 (65 FE) MG tablet Take 1 tablet (325 mg total) by mouth daily with breakfast. 90 tablet 1   fluticasone (FLONASE) 50 MCG/ACT nasal spray Place 1 spray into both nostrils at bedtime. 48 g 3   folic acid (FOLVITE) 1 MG tablet Take 1 mg by mouth daily.     gabapentin (NEURONTIN) 300 MG capsule TAKE 1 CAPSULE(300 MG) BY MOUTH THREE TIMES DAILY 270 capsule 1   hydrochlorothiazide (HYDRODIURIL) 12.5 MG tablet TAKE 1 TABLET(12.5 MG) BY MOUTH DAILY 90 tablet 0   hydroxychloroquine (PLAQUENIL) 200 MG tablet Take 1 tablet (200 mg total) by mouth daily. 30 tablet 2   JUNEL 1.5/30 1.5-30 MG-MCG tablet TAKE 1 TABLET BY MOUTH DAILY 21 tablet 3   ketoconazole (NIZORAL) 2 % shampoo APPLY 1 APPLICATION TOPICALLY EVERY 3 DAYS. LET SIT 5 MINUTES BEFORE RINSING OUT 120 mL 11   ketoconazole (NIZORAL) 2 % shampoo 1-2 times a week lather on scalp, leave on 5-8 minutes, rinse well 120  mL 11   lisinopril (ZESTRIL) 5 MG tablet TAKE 1 TABLET(5 MG) BY MOUTH DAILY 90 tablet 0   meloxicam (MOBIC) 7.5 MG tablet Take 1 tablet (7.5 mg total) by mouth 2 (two) times daily. 180 tablet 1   methotrexate (RHEUMATREX) 2.5 MG tablet Take 2.5 mg by mouth once a week. Caution:Chemotherapy. Protect from light.     mometasone (ELOCON) 0.1 % ointment APPLY TOPICALLY TO SORES ON FACE DAILY AS NEEDED FOR FLARES 45 g 6   montelukast (SINGULAIR) 10 MG tablet Take 1 tablet (10 mg total) by mouth at bedtime. 90 tablet 3   NON FORMULARY CPAP with AHP     oxyCODONE-acetaminophen (PERCOCET) 7.5-325 MG tablet Take 1 tablet by mouth every 4 (four) hours as needed for severe pain.  Polyethyl Glycol-Propyl Glycol (SYSTANE ULTRA) 0.4-0.3 % SOLN Place 1 drop into both eyes daily as needed (for dry eyes).      tacrolimus (PROTOPIC) 0.1 % ointment APPLY TOPICALLY TO THE AFFECTED AREA DAILY 100 g 3   triamcinolone cream (KENALOG) 0.1 % Apply 1 Application topically 2 (two) times daily as needed. Use up to 5 days a week as needed 80 g 2   amoxicillin-clavulanate (AUGMENTIN) 875-125 MG tablet Take 1 tablet by mouth 2 (two) times daily. Take with food. (Patient not taking: Reported on 08/19/2022) 20 tablet 0   PULMICORT FLEXHALER 90 MCG/ACT inhaler INHALE 2 PUFFS INTO THE LUNGS TWICE DAILY (Patient not taking: Reported on 08/19/2022) 3 each 3   No current facility-administered medications for this visit.    OBJECTIVE: Vitals:   08/19/22 1156  BP: 121/72  Pulse: 85  Resp: 18  Temp: 97.8 F (36.6 C)     Body mass index is 35.99 kg/m.      General: Well-developed, well-nourished, no acute distress. Eyes: Pink conjunctiva, anicteric sclera. HEENT: Normocephalic, moist mucous membranes, clear oropharnyx. Lungs: Clear to auscultation bilaterally. Heart: Regular rate and rhythm. No rubs, murmurs, or gallops. Abdomen: Soft, nontender, nondistended. No organomegaly noted, normoactive bowel  sounds. Musculoskeletal: No edema, cyanosis, or clubbing. Neuro: Alert, answering all questions appropriately. Cranial nerves grossly intact. Skin: No rashes or petechiae noted. Psych: Normal affect. Lymphatics: No cervical, calvicular, axillary or inguinal LAD.   LAB RESULTS:  Lab Results  Component Value Date   NA 137 08/19/2022   K 4.3 08/19/2022   CL 106 08/19/2022   CO2 24 08/19/2022   GLUCOSE 82 08/19/2022   BUN 12 08/19/2022   CREATININE 1.24 (H) 08/19/2022   CALCIUM 9.0 08/19/2022   PROT 8.0 08/19/2022   ALBUMIN 3.7 08/19/2022   AST 23 08/19/2022   ALT 16 08/19/2022   ALKPHOS 41 08/19/2022   BILITOT 0.5 08/19/2022   GFRNONAA 56 (L) 08/19/2022   GFRAA 58 (L) 10/29/2019    Lab Results  Component Value Date   WBC 5.9 08/19/2022   NEUTROABS 4.0 08/19/2022   HGB 10.1 (L) 08/19/2022   HCT 29.4 (L) 08/19/2022   MCV 91.9 08/19/2022   PLT 308 08/19/2022    Lab Results  Component Value Date   TIBC 335 06/12/2022   TIBC 275 02/26/2021   TIBC 269 10/29/2019   FERRITIN 263 (H) 06/12/2022   FERRITIN 215 (H) 02/26/2021   FERRITIN 83 10/29/2019   IRONPCTSAT 20 06/12/2022   IRONPCTSAT 16 02/26/2021   IRONPCTSAT 19 10/29/2019     STUDIES: No results found.  ASSESSMENT AND PLAN:   Morgan Kent is a 41 y.o. female with pmh of SLE follows with Dr. Winfield Rast at [facility] on Plaquenil, OSA, Arnold Chiari malformation status post craniotomy in 2015 was referred to hematology for incidental finding of bilateral enlarged axillary and subpectoral lymph nodes.  #Abnormal CT chest #Bilateral mildly enlarged axillary and subpectoral lymph nodes - Undiagnosed new problem with uncertain prognosis - Patient follows with Dr. Humphrey Rolls of pulmonary for OSA and obstructive chronic bronchitis. Recently, while undergoing PFTs she was having difficulty breathing.  CT chest was done on 07/09/2022 which showed incidental finding of focal encapsulated fat with minimal internal wispy soft tissue  along along the right heart border measuring 1.7 x 3.8 cm.  Follow-up CT in 6 to 12 months was recommended to ensure stability as low-grade liposarcoma cannot be definitely ruled out. Also, numerous subpectoral and axillary lymph nodes measuring up  to 10 mm bilaterally were seen.  - She is scheduled for mammogram today we will follow-up with that. -Patient is asymptomatic.  Denies any B symptoms.  Denies any recent infection or vaccine use.   - [place] axilla from 08/2021 also showed bilateral symmetric axillary adenopathy deemed reactive from underlying lupus. Considering the lymph nodes are very small, present at least since a year, has lupus and patient is otherwise asymptomatic we will hold off on biopsy at this time. -I added the patient to tumor board for radiology review.  I will inform the patient about our discussion. -Repeat CT chest with contrast scheduled in 6 months.  RTC in 6 months for MD visit, CT chest with contrast prior.  Patient expressed understanding and was in agreement with this plan. She also understands that She can call clinic at any time with any questions, concerns, or complaints.   I spent a total of 45 minutes reviewing chart data, face-to-face evaluation with the patient, counseling and coordination of care as detailed above.  Jane Canary, MD   08/20/2022 8:43 AM

## 2022-08-19 NOTE — Progress Notes (Unsigned)
Patient here today for initial evaluation regarding enlarged lymph node.

## 2022-08-22 ENCOUNTER — Ambulatory Visit: Payer: Medicare Other

## 2022-08-22 ENCOUNTER — Other Ambulatory Visit: Payer: Medicare Other

## 2022-08-22 DIAGNOSIS — E538 Deficiency of other specified B group vitamins: Secondary | ICD-10-CM

## 2022-08-22 MED ORDER — CYANOCOBALAMIN 1000 MCG/ML IJ SOLN
1000.0000 ug | Freq: Once | INTRAMUSCULAR | Status: AC
  Administered 2022-08-22: 1000 ug via INTRAMUSCULAR

## 2022-08-22 NOTE — Progress Notes (Signed)
Tumor Board Documentation  RAYNETTE ARRAS was presented by Dr Darrall Dears at our Tumor Board on 08/22/2022, which included representatives from pathology, radiology, medical oncology, surgical, pharmacy, pulmonology, genetics, radiation oncology, navigation, research, internal medicine.  Malai currently presents as a new patient, for discussion with history of the following treatments: active survellience.  Additionally, we reviewed previous medical and familial history, history of present illness, and recent lab results along with all available histopathologic and imaging studies. The tumor board considered available treatment options and made the following recommendations: Active surveillance (Repeat CT in 6 months, Keep mammogram appt as scheduled)    The following procedures/referrals were also placed: No orders of the defined types were placed in this encounter.   Clinical Trial Status: not discussed   Staging used: Not Applicable AJCC Staging:       Group: Enlarged Lymph Nodes bilateral armpits   National site-specific guidelines   were discussed with respect to the case.  Tumor board is a meeting of clinicians from various specialty areas who evaluate and discuss patients for whom a multidisciplinary approach is being considered. Final determinations in the plan of care are those of the provider(s). The responsibility for follow up of recommendations given during tumor board is that of the provider.   Today's extended care, comprehensive team conference, Tanaia was not present for the discussion and was not examined.   Multidisciplinary Tumor Board is a multidisciplinary case peer review process.  Decisions discussed in the Multidisciplinary Tumor Board reflect the opinions of the specialists present at the conference without having examined the patient.  Ultimately, treatment and diagnostic decisions rest with the primary provider(s) and the patient.

## 2022-08-26 ENCOUNTER — Telehealth: Payer: Self-pay

## 2022-08-26 NOTE — Telephone Encounter (Signed)
Pt called that she still having sore throat advised her to finished antibiotic gargle with salt water take dayquil or Mucinex and also advised her that gave little after she finished antibiotic if she is not feeling better need appt

## 2022-08-26 NOTE — Telephone Encounter (Signed)
Lmom to call us back regarding she left message

## 2022-09-06 ENCOUNTER — Other Ambulatory Visit: Payer: Self-pay | Admitting: Dermatology

## 2022-09-06 DIAGNOSIS — L700 Acne vulgaris: Secondary | ICD-10-CM

## 2022-09-10 ENCOUNTER — Ambulatory Visit
Admission: RE | Admit: 2022-09-10 | Discharge: 2022-09-10 | Disposition: A | Discharge status: Home / Self Care | Payer: Medicare Other | Source: Ambulatory Visit | Attending: Physician Assistant | Admitting: Physician Assistant

## 2022-09-10 DIAGNOSIS — Z1231 Encounter for screening mammogram for malignant neoplasm of breast: Secondary | ICD-10-CM | POA: Insufficient documentation

## 2022-09-19 ENCOUNTER — Ambulatory Visit: Payer: Medicare Other

## 2022-09-19 DIAGNOSIS — Z724 Inappropriate diet and eating habits: Secondary | ICD-10-CM | POA: Diagnosis not present

## 2022-09-19 DIAGNOSIS — I10 Essential (primary) hypertension: Secondary | ICD-10-CM | POA: Diagnosis not present

## 2022-09-19 DIAGNOSIS — G629 Polyneuropathy, unspecified: Secondary | ICD-10-CM | POA: Diagnosis not present

## 2022-09-19 DIAGNOSIS — M5412 Radiculopathy, cervical region: Secondary | ICD-10-CM | POA: Diagnosis not present

## 2022-09-19 DIAGNOSIS — Z79891 Long term (current) use of opiate analgesic: Secondary | ICD-10-CM | POA: Diagnosis not present

## 2022-09-19 DIAGNOSIS — M79662 Pain in left lower leg: Secondary | ICD-10-CM | POA: Diagnosis not present

## 2022-09-19 DIAGNOSIS — Z6839 Body mass index (BMI) 39.0-39.9, adult: Secondary | ICD-10-CM | POA: Diagnosis not present

## 2022-09-19 DIAGNOSIS — G909 Disorder of the autonomic nervous system, unspecified: Secondary | ICD-10-CM | POA: Diagnosis not present

## 2022-09-19 DIAGNOSIS — Z713 Dietary counseling and surveillance: Secondary | ICD-10-CM | POA: Diagnosis not present

## 2022-09-19 DIAGNOSIS — M5136 Other intervertebral disc degeneration, lumbar region: Secondary | ICD-10-CM | POA: Diagnosis not present

## 2022-09-19 DIAGNOSIS — M25512 Pain in left shoulder: Secondary | ICD-10-CM | POA: Diagnosis not present

## 2022-09-19 DIAGNOSIS — M79661 Pain in right lower leg: Secondary | ICD-10-CM | POA: Diagnosis not present

## 2022-09-19 DIAGNOSIS — M25511 Pain in right shoulder: Secondary | ICD-10-CM | POA: Diagnosis not present

## 2022-09-19 DIAGNOSIS — G894 Chronic pain syndrome: Secondary | ICD-10-CM | POA: Diagnosis not present

## 2022-09-20 ENCOUNTER — Ambulatory Visit (INDEPENDENT_AMBULATORY_CARE_PROVIDER_SITE_OTHER): Payer: Medicare Other

## 2022-09-20 DIAGNOSIS — E538 Deficiency of other specified B group vitamins: Secondary | ICD-10-CM | POA: Diagnosis not present

## 2022-09-20 MED ORDER — CYANOCOBALAMIN 1000 MCG/ML IJ SOLN
1000.0000 ug | Freq: Once | INTRAMUSCULAR | Status: AC
  Administered 2022-09-20: 1000 ug via INTRAMUSCULAR

## 2022-09-21 ENCOUNTER — Other Ambulatory Visit: Payer: Self-pay | Admitting: Dermatology

## 2022-09-21 DIAGNOSIS — K130 Diseases of lips: Secondary | ICD-10-CM

## 2022-09-21 DIAGNOSIS — L930 Discoid lupus erythematosus: Secondary | ICD-10-CM

## 2022-09-23 ENCOUNTER — Other Ambulatory Visit: Payer: Self-pay | Admitting: Physician Assistant

## 2022-09-23 ENCOUNTER — Other Ambulatory Visit: Payer: Self-pay | Admitting: Nurse Practitioner

## 2022-09-23 ENCOUNTER — Other Ambulatory Visit: Payer: Self-pay

## 2022-09-23 DIAGNOSIS — J309 Allergic rhinitis, unspecified: Secondary | ICD-10-CM

## 2022-09-23 MED ORDER — CARVEDILOL 25 MG PO TABS: ORAL_TABLET | ORAL | 0 refills | Status: DC

## 2022-10-08 ENCOUNTER — Other Ambulatory Visit: Payer: Self-pay

## 2022-10-08 DIAGNOSIS — Z3041 Encounter for surveillance of contraceptive pills: Secondary | ICD-10-CM

## 2022-10-08 MED ORDER — NORETHINDRONE ACET-ETHINYL EST 1.5-30 MG-MCG PO TABS: 1.0000 | ORAL_TABLET | Freq: Every day | ORAL | 3 refills | Status: DC

## 2022-10-10 DIAGNOSIS — M329 Systemic lupus erythematosus, unspecified: Secondary | ICD-10-CM | POA: Diagnosis not present

## 2022-10-10 DIAGNOSIS — I7300 Raynaud's syndrome without gangrene: Secondary | ICD-10-CM | POA: Diagnosis not present

## 2022-10-10 DIAGNOSIS — Z79899 Other long term (current) drug therapy: Secondary | ICD-10-CM | POA: Diagnosis not present

## 2022-10-10 DIAGNOSIS — N3090 Cystitis, unspecified without hematuria: Secondary | ICD-10-CM | POA: Diagnosis not present

## 2022-10-10 DIAGNOSIS — L930 Discoid lupus erythematosus: Secondary | ICD-10-CM | POA: Diagnosis not present

## 2022-10-15 ENCOUNTER — Other Ambulatory Visit: Payer: Self-pay | Admitting: Neurosurgery

## 2022-10-15 DIAGNOSIS — M542 Cervicalgia: Secondary | ICD-10-CM | POA: Diagnosis not present

## 2022-10-15 DIAGNOSIS — G935 Compression of brain: Secondary | ICD-10-CM | POA: Diagnosis not present

## 2022-10-17 ENCOUNTER — Ambulatory Visit (INDEPENDENT_AMBULATORY_CARE_PROVIDER_SITE_OTHER): Payer: Medicare Other | Admitting: Physician Assistant

## 2022-10-17 ENCOUNTER — Encounter: Payer: Self-pay | Admitting: Physician Assistant

## 2022-10-17 ENCOUNTER — Other Ambulatory Visit: Payer: Self-pay | Admitting: Dermatology

## 2022-10-17 ENCOUNTER — Other Ambulatory Visit: Payer: Self-pay | Admitting: Physician Assistant

## 2022-10-17 VITALS — BP 108/70 | HR 87 | Temp 98.0°F | Resp 16 | Ht 66.0 in | Wt 217.0 lb

## 2022-10-17 DIAGNOSIS — M064 Inflammatory polyarthropathy: Secondary | ICD-10-CM

## 2022-10-17 DIAGNOSIS — I10 Essential (primary) hypertension: Secondary | ICD-10-CM | POA: Diagnosis not present

## 2022-10-17 DIAGNOSIS — L930 Discoid lupus erythematosus: Secondary | ICD-10-CM

## 2022-10-17 DIAGNOSIS — M329 Systemic lupus erythematosus, unspecified: Secondary | ICD-10-CM | POA: Diagnosis not present

## 2022-10-17 DIAGNOSIS — M792 Neuralgia and neuritis, unspecified: Secondary | ICD-10-CM

## 2022-10-17 DIAGNOSIS — G4733 Obstructive sleep apnea (adult) (pediatric): Secondary | ICD-10-CM | POA: Diagnosis not present

## 2022-10-17 NOTE — Progress Notes (Signed)
[facility] [place], [place] [address]  Internal MEDICINE  Office Visit Note  Patient Name: Morgan Kent  [id number]  [id number]  Date of Service: 10/17/2022  Chief Complaint  Patient presents with   Follow-up   Depression   Hypertension   Referral    Patient requesting a referral to an eye doctor and a dentist    HPI Pt is here for routine follow up -Recent lupus flare, she was put on steroids by rheumatologist. Had labs and urine checked and found to have protein in urine and has been referred to nephrology -Saw Dr. Arnoldo Morale for her neck and has an MRI scheduled for next week -Due for eye exam for glaucoma and  monitoring due to lupus and will call for this -Some dental concerns and looking for a dentist -BP on the lower side in office today, sees cardiology next week, if continued low readings may be able to lower medication dose and advised to log at home to bring to that visit or call the office here if low -In need of cpap supplies, but states DME company told her they couldn't find her in system--will check into this and have them reach out to her for new supplies  Current Medication: Outpatient Encounter Medications as of 10/17/2022  Medication Sig   albuterol (VENTOLIN HFA) 108 (90 Base) MCG/ACT inhaler Inhale 2 puffs into the lungs daily.   amLODipine (NORVASC) 5 MG tablet TAKE 1 TABLET(5 MG) BY MOUTH DAILY   amoxicillin-clavulanate (AUGMENTIN) 875-125 MG tablet Take 1 tablet by mouth 2 (two) times daily. Take with food.   Budeson-Glycopyrrol-Formoterol (BREZTRI AEROSPHERE) 160-9-4.8 MCG/ACT AERO Inhale 2 puffs into the lungs 2 (two) times daily.   carvedilol (COREG) 25 MG tablet TAKE 1 TABLET(25 MG) BY MOUTH TWICE DAILY   cetirizine (ZYRTEC) 10 MG tablet Take 1 tablet (10 mg total) by mouth daily.   clobetasol (TEMOVATE) 0.05 % external solution Apply 1 application. topically 2 (two) times daily as needed. Apply to scalp   Cyanocobalamin (B-12  COMPLIANCE INJECTION) 1000 MCG/ML KIT Inject as directed.   cyclobenzaprine (FLEXERIL) 10 MG tablet Take 1 tablet (10 mg total) by mouth 3 (three) times daily as needed.   Dapsone 7.5 % GEL APPLY TOPICALLY TO TO THE AFFECTED AREA EVERY DAY   diclofenac Sodium (VOLTAREN) 1 % GEL Apply 2 g topically 4 (four) times daily.   ergocalciferol (DRISDOL) 1.25 MG (50000 UT) capsule Take one cap q week   ferrous sulfate (FERROUSUL) 325 (65 FE) MG tablet Take 1 tablet (325 mg total) by mouth daily with breakfast.   fluticasone (FLONASE) 50 MCG/ACT nasal spray USE 1 SPRAY IRN AT BEDTIME   folic acid (FOLVITE) 1 MG tablet Take 1 mg by mouth daily.   gabapentin (NEURONTIN) 300 MG capsule TAKE 1 CAPSULE(300 MG) BY MOUTH THREE TIMES DAILY   hydrochlorothiazide (HYDRODIURIL) 12.5 MG tablet TAKE 1 TABLET(12.5 MG) BY MOUTH DAILY   hydroxychloroquine (PLAQUENIL) 200 MG tablet Take 1 tablet (200 mg total) by mouth daily.   ketoconazole (NIZORAL) 2 % shampoo APPLY 1 APPLICATION TOPICALLY EVERY 3 DAYS. LET SIT 5 MINUTES BEFORE RINSING OUT   ketoconazole (NIZORAL) 2 % shampoo 1-2 times a week lather on scalp, leave on 5-8 minutes, rinse well   lisinopril (ZESTRIL) 5 MG tablet TAKE 1 TABLET(5 MG) BY MOUTH DAILY   meloxicam (MOBIC) 7.5 MG tablet Take 1 tablet (7.5 mg total) by mouth 2 (two) times daily.   methotrexate (RHEUMATREX) 2.5 MG  tablet Take 2.5 mg by mouth once a week. Caution:Chemotherapy. Protect from light.   mometasone (ELOCON) 0.1 % ointment APPLY TOPICALLY TO SORES ON FACE DAILY AS NEEDED FOR FLARES   montelukast (SINGULAIR) 10 MG tablet Take 1 tablet (10 mg total) by mouth at bedtime.   NON FORMULARY CPAP with AHP   Norethindrone Acetate-Ethinyl Estradiol (JUNEL 1.5/30) 1.5-30 MG-MCG tablet Take 1 tablet by mouth daily.   oxyCODONE-acetaminophen (PERCOCET) 7.5-325 MG tablet Take 1 tablet by mouth every 4 (four) hours as needed for severe pain.   Polyethyl Glycol-Propyl Glycol (SYSTANE ULTRA) 0.4-0.3 % SOLN  Place 1 drop into both eyes daily as needed (for dry eyes).    PULMICORT FLEXHALER 90 MCG/ACT inhaler INHALE 2 PUFFS INTO THE LUNGS TWICE DAILY   tacrolimus (PROTOPIC) 0.1 % ointment APPLY TOPICALLY TO THE AFFECTED AREA DAILY   triamcinolone cream (KENALOG) 0.1 % APPLY EXTERNALLY TO THE AFFECTED AREA TWICE DAILY AS NEEDED FOR UP TO 5 DAYS A WEEK   No facility-administered encounter medications on file as of 10/17/2022.    Surgical History: Past Surgical History:  Procedure Laterality Date   carpel tunnel  Left 2014   CRANIOTOMY  14,06   chiari   DENTAL SURGERY     HERNIA REPAIR     umbilical   PLACEMENT OF LUMBAR DRAIN N/A 07/07/2014   Procedure: PLACEMENT OF LUMBAR DRAIN AND CLOSURE OF CERVICAL INCISION;  Surgeon: Newman Pies, MD;  Location: [place] [facility];  Service: Neurosurgery;  Laterality: N/A;   SUBOCCIPITAL CRANIECTOMY CERVICAL LAMINECTOMY N/A 09/29/2013   Procedure: SUBOCCIPITAL CRANIECTOMY CERVICAL LAMINECTOMY/DURAPLASTY;  Surgeon: Ophelia Charter, MD;  Location: [place] [facility];  Service: Neurosurgery;  Laterality: N/A;  posterior   SUBOCCIPITAL CRANIECTOMY CERVICAL LAMINECTOMY N/A 06/23/2014   Procedure: SUBOCCIPITAL CRANIECTOMY CERVICAL LAMINECTOMY/DURAPLASTY;  Surgeon: Newman Pies, MD;  Location: [place] [facility];  Service: Neurosurgery;  Laterality: N/A;  suboccipital craniectomy with cervical laminectomy and duraplasty    Medical History: Past Medical History:  Diagnosis Date   Anxiety    Arthritis    rheumo possible   Chiari malformation    Depression    Dizziness    Headache(784.0)    History of kidney stones    Hypertension    Lupus (HCC)    Lupus (HCC)    Nerve damage    NECK   PONV (postoperative nausea and vomiting)    nausea only   Sleep apnea    TMJ arthritis     Family History: Family History  Problem Relation Age of Onset   Heart disease Father    Hypertension Father    Hypertension Mother    Thyroid disease Mother    Asthma Other     Depression Other    Diabetes Other    Migraines Other    Stroke Other     Social History   Socioeconomic History   Marital status: Single    Spouse name: Not on file   Number of children: Not on file   Years of education: Not on file   Highest education level: Not on file  Occupational History   Not on file  Tobacco Use   Smoking status: Never   Smokeless tobacco: Never  Vaping Use   Vaping Use: Never used  Substance and Sexual Activity   Alcohol use: No    Alcohol/week: 0.0 standard drinks of alcohol   Drug use: No   Sexual activity: Not on file  Other Topics Concern   Not on file  Social History Narrative   Lives with 3 children in 2 story home.  Unemployed.  Trying to get disability.  Education: high school.   Social Determinants of Health   Financial Resource Strain: Not on file  Food Insecurity: Not on file  Transportation Needs: Not on file  Physical Activity: Not on file  Stress: Not on file  Social Connections: Not on file  Intimate Partner Violence: Not on file      Review of Systems  Constitutional:  Negative for chills and unexpected weight change.  HENT:  Positive for dental problem. Negative for congestion, rhinorrhea, sneezing and sore throat.   Eyes:  Negative for redness.  Respiratory:  Negative for cough, chest tightness and shortness of breath.   Cardiovascular:  Negative for chest pain and palpitations.  Gastrointestinal:  Negative for abdominal pain, constipation, diarrhea, nausea and vomiting.  Genitourinary:  Negative for dysuria and frequency.  Musculoskeletal:  Positive for arthralgias and neck pain. Negative for back pain and joint swelling.  Skin:  Negative for rash.  Neurological: Negative.  Negative for tremors and numbness.  Hematological:  Negative for adenopathy. Does not bruise/bleed easily.  Psychiatric/Behavioral:  Negative for behavioral problems (Depression), sleep disturbance and suicidal ideas. The patient is not  nervous/anxious.     Vital Signs: BP 108/70   Pulse 87   Temp 98 F (36.7 C)   Resp 16   Ht _0  (1.676 m)   Wt 217 lb (98.4 kg)   SpO2 99%   BMI 35.02 kg/m    Physical Exam Vitals and nursing note reviewed.  Constitutional:      Appearance: Normal appearance. She is obese.  HENT:     Head: Normocephalic and atraumatic.     Nose: Nose normal.     Mouth/Throat:     Mouth: Mucous membranes are moist.     Pharynx: No posterior oropharyngeal erythema.  Eyes:     Extraocular Movements: Extraocular movements intact.     Pupils: Pupils are equal, round, and reactive to light.  Cardiovascular:     Rate and Rhythm: Normal rate and regular rhythm.     Pulses: Normal pulses.     Heart sounds: Normal heart sounds.  Pulmonary:     Effort: Pulmonary effort is normal.     Breath sounds: Normal breath sounds.  Chest:     Chest wall: No tenderness.  Breasts:    Right: No mass.     Left: No mass.  Abdominal:     General: Abdomen is flat.     Palpations: Abdomen is soft.  Musculoskeletal:        General: Normal range of motion.     Cervical back: Normal range of motion.  Skin:    General: Skin is warm and dry.  Neurological:     General: No focal deficit present.     Mental Status: She is alert.  Psychiatric:        Mood and Affect: Mood normal.        Behavior: Behavior normal.        Assessment/Plan: 1. Essential hypertension On the low side, but asymptomatic. Will monitor at home this next week and may discuss lowering dose of meds at cardiology visit next week or will call office here for adjustment if low  2. OSA (obstructive sleep apnea) Will contact DME company for new supplies, continue nightly use  3. Lupus ([place]) Followed by rheumatology   General Counseling: Mirtha verbalizes understanding of the findings of  todays visit and agrees with plan of treatment. I have discussed any further diagnostic evaluation that may be needed or ordered today. We also  reviewed her medications today. she has been encouraged to call the office with any questions or concerns that should arise related to todays visit.    No orders of the defined types were placed in this encounter.   No orders of the defined types were placed in this encounter.   This patient was seen by Drema Dallas, PA-C in collaboration with Dr. Clayborn Bigness as a part of collaborative care agreement.   Total time spent:30 Minutes Time spent includes review of chart, medications, test results, and follow up plan with the patient.      Dr Lavera Guise Internal medicine

## 2022-10-18 DIAGNOSIS — G894 Chronic pain syndrome: Secondary | ICD-10-CM | POA: Diagnosis not present

## 2022-10-18 DIAGNOSIS — Z79891 Long term (current) use of opiate analgesic: Secondary | ICD-10-CM | POA: Diagnosis not present

## 2022-10-21 NOTE — Progress Notes (Unsigned)
Cardiology Office Note  Date:  10/22/2022   ID:  Morgan Kent, DOB [birth year], MRN [id number]  PCP:  Mylinda Latina, PA-C   Chief Complaint  Patient presents with   12 month follow up     Patient c/o left foot and ankle swelling and very painful to walk on. Medications reviewed by the patient verbally.    HPI:  Morgan Kent is a pleasant 41 year old woman with  morbid obesity, lupus, dx in 2013 skin and fingertips affected neck surgery With chronic pain down her arms bilaterally,  sleep apnea on nasal CPAP,   EF 60 to 65% in 11/2015, up from 45% in 2016 who presents for follow-up of her symptoms of shortness of breath, cardiomyopathy  Last seen by myself in clinic October 2022  Doing "ok"  Lab work reviewed Protein in urine Cr 1.5, GFR 44 HGB 10.9, on iron for several months  Left ankle is sore to walk, seem to start in the past day or so Little bit of swelling left ankle On prednisone taper for lupus flare, Followed by rheumatology, Dr. Winfield Rast Prior issues with skin, fingertips tingling,   meds reviewed On coreg, lisinopril, HCTZ Blood pressure well-controlled  Dx with OSA, Has CPAP, difficulty getting supplies  For back/spine,  neuropathy, followed by Dr. Arnoldo Morale Had prior surgery  Followed by pain clinic in [facility] for pain in her neck, arms, legs, hurts to walk Periodically taking meloxicam with gabapentin  In the hospital January 2021 for iron deficiency anemia  Chronic SOB on exertion,  no regular exercise Pain with walking, back/shoulders  Lab Results  Component Value Date   CHOL 167 06/12/2022   HDL 34 (L) 06/12/2022   LDLCALC 87 06/12/2022   TRIG 274 (H) 06/12/2022    EKG personally reviewed by myself on todays visit Shows normal sinus rhythm rate 91 bpm no significant ST or T wave changes, frequent PVCs  PMH:   has a past medical history of Anxiety, Arthritis, Chiari malformation, Depression, Dizziness, Headache(784.0), History of kidney  stones, Hypertension, Lupus ([place]), Lupus ([place]), Nerve damage, PONV (postoperative nausea and vomiting), Sleep apnea, and TMJ arthritis.  PSH:    Past Surgical History:  Procedure Laterality Date   carpel tunnel  Left 2014   CRANIOTOMY  14,06   chiari   DENTAL SURGERY     HERNIA REPAIR     umbilical   PLACEMENT OF LUMBAR DRAIN N/A 07/07/2014   Procedure: PLACEMENT OF LUMBAR DRAIN AND CLOSURE OF CERVICAL INCISION;  Surgeon: Newman Pies, MD;  Location: [place] [facility];  Service: Neurosurgery;  Laterality: N/A;   SUBOCCIPITAL CRANIECTOMY CERVICAL LAMINECTOMY N/A 09/29/2013   Procedure: SUBOCCIPITAL CRANIECTOMY CERVICAL LAMINECTOMY/DURAPLASTY;  Surgeon: Ophelia Charter, MD;  Location: [place] [facility];  Service: Neurosurgery;  Laterality: N/A;  posterior   SUBOCCIPITAL CRANIECTOMY CERVICAL LAMINECTOMY N/A 06/23/2014   Procedure: SUBOCCIPITAL CRANIECTOMY CERVICAL LAMINECTOMY/DURAPLASTY;  Surgeon: Newman Pies, MD;  Location: [place] [facility];  Service: Neurosurgery;  Laterality: N/A;  suboccipital craniectomy with cervical laminectomy and duraplasty    Current Outpatient Medications  Medication Sig Dispense Refill   albuterol (VENTOLIN HFA) 108 (90 Base) MCG/ACT inhaler Inhale 2 puffs into the lungs daily. 18 g 5   amLODipine (NORVASC) 5 MG tablet TAKE 1 TABLET(5 MG) BY MOUTH DAILY 90 tablet 3   Budeson-Glycopyrrol-Formoterol (BREZTRI AEROSPHERE) 160-9-4.8 MCG/ACT AERO Inhale 2 puffs into the lungs 2 (two) times daily. 10.7 g 11   carvedilol (COREG) 25 MG tablet TAKE 1 TABLET(25 MG) BY MOUTH  TWICE DAILY 60 tablet 0   cetirizine (ZYRTEC) 10 MG tablet Take 1 tablet (10 mg total) by mouth daily. 30 tablet 5   clobetasol (TEMOVATE) 0.05 % external solution Apply 1 application. topically 2 (two) times daily as needed. Apply to scalp 50 mL 2   Cyanocobalamin (B-12 COMPLIANCE INJECTION) 1000 MCG/ML KIT Inject as directed.     cyclobenzaprine (FLEXERIL) 10 MG tablet Take 1 tablet (10 mg total) by mouth 3  (three) times daily as needed. 30 tablet 1   Dapsone 7.5 % GEL APPLY TOPICALLY TO TO THE AFFECTED AREA EVERY DAY 90 g 1   diclofenac Sodium (VOLTAREN) 1 % GEL APPLY 2 GRAMS TOPICALLY TO THE AFFECTED AREA FOUR TIMES DAILY 300 g 1   ergocalciferol (DRISDOL) 1.25 MG (50000 UT) capsule Take one cap q week 12 capsule 3   ferrous sulfate (FERROUSUL) 325 (65 FE) MG tablet Take 1 tablet (325 mg total) by mouth daily with breakfast. 90 tablet 1   fluticasone (FLONASE) 50 MCG/ACT nasal spray USE 1 SPRAY IRN AT BEDTIME 48 g 3   folic acid (FOLVITE) 1 MG tablet Take 1 mg by mouth daily.     gabapentin (NEURONTIN) 300 MG capsule TAKE 1 CAPSULE(300 MG) BY MOUTH THREE TIMES DAILY 270 capsule 1   hydrochlorothiazide (HYDRODIURIL) 12.5 MG tablet TAKE 1 TABLET(12.5 MG) BY MOUTH DAILY 90 tablet 0   hydroxychloroquine (PLAQUENIL) 200 MG tablet Take 1 tablet (200 mg total) by mouth daily. 30 tablet 2   ketoconazole (NIZORAL) 2 % shampoo 1-2 times a week lather on scalp, leave on 5-8 minutes, rinse well 120 mL 11   lisinopril (ZESTRIL) 5 MG tablet TAKE 1 TABLET(5 MG) BY MOUTH DAILY 90 tablet 0   meloxicam (MOBIC) 7.5 MG tablet TAKE 1 TABLET(7.5 MG) BY MOUTH TWICE DAILY 180 tablet 1   methotrexate (RHEUMATREX) 2.5 MG tablet Take 2.5 mg by mouth once a week. Caution:Chemotherapy. Protect from light.     mometasone (ELOCON) 0.1 % lotion APPLY TOPICALLY TO THE AFFECTED AREA DAILY 60 mL 0   montelukast (SINGULAIR) 10 MG tablet Take 1 tablet (10 mg total) by mouth at bedtime. 90 tablet 3   NON FORMULARY CPAP with AHP     Norethindrone Acetate-Ethinyl Estradiol (JUNEL 1.5/30) 1.5-30 MG-MCG tablet Take 1 tablet by mouth daily. 21 tablet 3   oxyCODONE-acetaminophen (PERCOCET) 7.5-325 MG tablet Take 1 tablet by mouth every 4 (four) hours as needed for severe pain.     Polyethyl Glycol-Propyl Glycol (SYSTANE ULTRA) 0.4-0.3 % SOLN Place 1 drop into both eyes daily as needed (for dry eyes).      predniSONE (DELTASONE) 5 MG tablet  Take by mouth.     PULMICORT FLEXHALER 90 MCG/ACT inhaler INHALE 2 PUFFS INTO THE LUNGS TWICE DAILY 3 each 3   tacrolimus (PROTOPIC) 0.1 % ointment APPLY TOPICALLY TO THE AFFECTED AREA DAILY 100 g 3   triamcinolone cream (KENALOG) 0.1 % APPLY EXTERNALLY TO THE AFFECTED AREA TWICE DAILY AS NEEDED FOR UP TO 5 DAYS A WEEK 80 g 2   amoxicillin-clavulanate (AUGMENTIN) 875-125 MG tablet Take 1 tablet by mouth 2 (two) times daily. Take with food. (Patient not taking: Reported on 10/22/2022) 20 tablet 0   ketoconazole (NIZORAL) 2 % shampoo APPLY 1 APPLICATION TOPICALLY EVERY 3 DAYS. LET SIT 5 MINUTES BEFORE RINSING OUT (Patient not taking: Reported on 10/22/2022) 120 mL 11   mometasone (ELOCON) 0.1 % ointment APPLY TOPICALLY TO SORES ON FACE DAILY AS NEEDED FOR FLARES (Patient  not taking: Reported on 10/22/2022) 45 g 6   No current facility-administered medications for this visit.    Allergies:   Misc. sulfonamide containing compounds, Other, Sulfa antibiotics, and Cephalexin   Social History:  The patient  reports that she has never smoked. She has never used smokeless tobacco. She reports that she does not drink alcohol and does not use drugs.   Family History:   family history includes Asthma in an other family member; Depression in an other family member; Diabetes in an other family member; Heart disease in her father; Hypertension in her father and mother; Migraines in an other family member; Stroke in an other family member; Thyroid disease in her mother.    Review of Systems: Review of Systems  Constitutional: Negative.   HENT: Negative.    Respiratory: Negative.    Cardiovascular: Negative.   Gastrointestinal: Negative.   Musculoskeletal:  Positive for joint pain.       Chronic arm, neck, leg pain, finger tips sore  Neurological: Negative.   Psychiatric/Behavioral: Negative.    All other systems reviewed and are negative.   PHYSICAL EXAM: VS:  BP 130/74 (BP Location: Left Arm,  Patient Position: Sitting, Cuff Size: Normal)   Pulse 91   Ht _0  (1.676 m)   Wt 223 lb 2 oz (101.2 kg)   SpO2 98%   BMI 36.01 kg/m  , BMI Body mass index is 36.01 kg/m.  Constitutional:  oriented to person, place, and time. No distress.  HENT:  Head: Grossly normal Eyes:  no discharge. No scleral icterus.  Neck: No JVD, no carotid bruits  Cardiovascular: Regular rate and rhythm, no murmurs appreciated Pulmonary/Chest: Clear to auscultation bilaterally, no wheezes or rails Abdominal: Soft.  no distension.  no tenderness.  Musculoskeletal: Normal range of motion Neurological:  normal muscle tone. Coordination normal. No atrophy Skin: Skin warm and dry Psychiatric: normal affect, pleasant  Recent Labs: 06/12/2022: TSH 1.290 08/19/2022: ALT 16; BUN 12; Creatinine, Ser 1.24; Hemoglobin 10.1; Platelets 308; Potassium 4.3; Sodium 137    Lipid Panel Lab Results  Component Value Date   CHOL 167 06/12/2022   HDL 34 (L) 06/12/2022   LDLCALC 87 06/12/2022   TRIG 274 (H) 06/12/2022      Wt Readings from Last 3 Encounters:  10/22/22 223 lb 2 oz (101.2 kg)  10/17/22 217 lb (98.4 kg)  08/19/22 223 lb (101.2 kg)    ASSESSMENT AND PLAN:  obesity  We have encouraged continued careful diet management in an effort to lose weight. Walking with a cane  Essential hypertension Blood pressure is well controlled on today's visit. No changes made to the medications.  Obstructive sleep apnea on CPAP Diagnoses with sleep apnea per PMD Difficulty getting CPAP supplies  Shortness of breath Deconditioned, weight is elevated Activity limited by pain  Tachycardia/PVCs PVCs noted on EKG, asymptomatic Reports tachycardia symptoms are stable on beta-blocker, no changes to her medications Continue coreg 25 BID  Chronic pain, arms Chronic neck, shoulder, back, arm pain  on Neurontin,   followed by Dr. Arnoldo Morale Prior surgery  Lupus Followed by rheumatology, on prednisone  taper  Anemia: Mildly depressed, on iron   Total encounter time more than 30 minutes  Greater than 50% was spent in counseling and coordination of care with the patient   No orders of the defined types were placed in this encounter.    Signed, Esmond Plants, M.D., Ph.D. 10/22/2022  [place], [place]

## 2022-10-22 ENCOUNTER — Ambulatory Visit
Admission: RE | Admit: 2022-10-22 | Discharge: 2022-10-22 | Disposition: A | Discharge status: Home / Self Care | Payer: Medicare Other | Source: Ambulatory Visit | Attending: Neurosurgery | Admitting: Neurosurgery

## 2022-10-22 ENCOUNTER — Ambulatory Visit: Payer: Medicare Other | Attending: Cardiovascular Disease | Admitting: Cardiovascular Disease

## 2022-10-22 ENCOUNTER — Encounter: Payer: Self-pay | Admitting: Cardiovascular Disease

## 2022-10-22 VITALS — BP 130/74 | HR 91 | Ht 66.0 in | Wt 223.1 lb

## 2022-10-22 DIAGNOSIS — I420 Dilated cardiomyopathy: Secondary | ICD-10-CM | POA: Diagnosis not present

## 2022-10-22 DIAGNOSIS — M542 Cervicalgia: Secondary | ICD-10-CM | POA: Diagnosis not present

## 2022-10-22 DIAGNOSIS — M47812 Spondylosis without myelopathy or radiculopathy, cervical region: Secondary | ICD-10-CM | POA: Diagnosis not present

## 2022-10-22 DIAGNOSIS — I10 Essential (primary) hypertension: Secondary | ICD-10-CM | POA: Diagnosis not present

## 2022-10-22 DIAGNOSIS — I5022 Chronic systolic (congestive) heart failure: Secondary | ICD-10-CM

## 2022-10-22 DIAGNOSIS — M4312 Spondylolisthesis, cervical region: Secondary | ICD-10-CM | POA: Diagnosis not present

## 2022-10-22 DIAGNOSIS — L930 Discoid lupus erythematosus: Secondary | ICD-10-CM | POA: Diagnosis not present

## 2022-10-22 DIAGNOSIS — G4733 Obstructive sleep apnea (adult) (pediatric): Secondary | ICD-10-CM

## 2022-10-22 DIAGNOSIS — I7300 Raynaud's syndrome without gangrene: Secondary | ICD-10-CM | POA: Insufficient documentation

## 2022-10-22 DIAGNOSIS — G935 Compression of brain: Secondary | ICD-10-CM | POA: Insufficient documentation

## 2022-10-22 MED ORDER — HYDROCHLOROTHIAZIDE 12.5 MG PO TABS: 12.5000 mg | ORAL_TABLET | Freq: Every day | ORAL | 3 refills | Status: DC

## 2022-10-22 MED ORDER — LISINOPRIL 5 MG PO TABS: 5.0000 mg | ORAL_TABLET | Freq: Every day | ORAL | 3 refills | Status: DC

## 2022-10-22 MED ORDER — CARVEDILOL 25 MG PO TABS: ORAL_TABLET | ORAL | 3 refills | Status: DC

## 2022-10-22 NOTE — Patient Instructions (Signed)
Medication Instructions:  No changes  If you need a refill on your cardiac medications before your next appointment, please call your pharmacy.   Lab work: No new labs needed  Testing/Procedures: No new testing needed  Follow-Up: At [facility], you and your health needs are our priority.  As part of our continuing mission to provide you with exceptional heart care, we have created designated Provider Care Teams.  These Care Teams include your primary Cardiologist (physician) and Advanced Practice Providers (APPs -  Physician Assistants and Nurse Practitioners) who all work together to provide you with the care you need, when you need it.  You will need a follow up appointment in 12 months  Providers on your designated Care Team:   Christopher Berge, NP Ryan Dunn, PA-C Cadence Furth, PA-C  COVID-19 Vaccine Information can be found at: [email or web address] For questions related to vaccine distribution or appointments, please email [email or web address] or call [phone].   

## 2022-10-24 ENCOUNTER — Ambulatory Visit (INDEPENDENT_AMBULATORY_CARE_PROVIDER_SITE_OTHER): Payer: Medicare Other

## 2022-10-24 DIAGNOSIS — E538 Deficiency of other specified B group vitamins: Secondary | ICD-10-CM

## 2022-10-24 MED ORDER — CYANOCOBALAMIN 1000 MCG/ML IJ SOLN
1000.0000 ug | Freq: Once | INTRAMUSCULAR | Status: AC
  Administered 2022-10-24: 1000 ug via INTRAMUSCULAR

## 2022-11-02 ENCOUNTER — Other Ambulatory Visit: Payer: Self-pay | Admitting: Internal Medicine

## 2022-11-02 DIAGNOSIS — D509 Iron deficiency anemia, unspecified: Secondary | ICD-10-CM

## 2022-11-15 DIAGNOSIS — G894 Chronic pain syndrome: Secondary | ICD-10-CM | POA: Diagnosis not present

## 2022-11-15 DIAGNOSIS — Z79891 Long term (current) use of opiate analgesic: Secondary | ICD-10-CM | POA: Diagnosis not present

## 2022-11-21 ENCOUNTER — Ambulatory Visit (INDEPENDENT_AMBULATORY_CARE_PROVIDER_SITE_OTHER): Payer: Medicare Other

## 2022-11-21 DIAGNOSIS — E538 Deficiency of other specified B group vitamins: Secondary | ICD-10-CM

## 2022-11-21 MED ORDER — CYANOCOBALAMIN 1000 MCG/ML IJ SOLN
1000.0000 ug | Freq: Once | INTRAMUSCULAR | Status: AC
  Administered 2022-11-21: 1000 ug via INTRAMUSCULAR

## 2022-11-26 ENCOUNTER — Other Ambulatory Visit: Payer: Self-pay | Admitting: Dermatology

## 2022-11-26 DIAGNOSIS — L930 Discoid lupus erythematosus: Secondary | ICD-10-CM

## 2022-11-26 DIAGNOSIS — L700 Acne vulgaris: Secondary | ICD-10-CM

## 2022-12-18 ENCOUNTER — Ambulatory Visit (INDEPENDENT_AMBULATORY_CARE_PROVIDER_SITE_OTHER): Payer: Medicare Other

## 2022-12-18 DIAGNOSIS — G4733 Obstructive sleep apnea (adult) (pediatric): Secondary | ICD-10-CM | POA: Diagnosis not present

## 2022-12-18 DIAGNOSIS — E538 Deficiency of other specified B group vitamins: Secondary | ICD-10-CM

## 2022-12-18 MED ORDER — CYANOCOBALAMIN 1000 MCG/ML IJ SOLN
1000.0000 ug | Freq: Once | INTRAMUSCULAR | Status: AC
  Administered 2022-12-18: 1000 ug via INTRAMUSCULAR

## 2022-12-18 NOTE — Progress Notes (Signed)
95 percentile pressure 6.6   95th percentile leak 2.8   apnea index 0.1 /hr  apnea-hypopnea index  0.2 /hr   total days used  >4 hr 26 days  total days used <4 hr 3 days  Total compliance 87 percent  She is doing great need new supplies will see about getting new order for supplies   Pt was seen by Claiborne Billings  RRT/RCP  from [employer]

## 2022-12-19 ENCOUNTER — Ambulatory Visit: Payer: Medicare Other

## 2022-12-19 DIAGNOSIS — Z79891 Long term (current) use of opiate analgesic: Secondary | ICD-10-CM | POA: Diagnosis not present

## 2022-12-19 DIAGNOSIS — G894 Chronic pain syndrome: Secondary | ICD-10-CM | POA: Diagnosis not present

## 2022-12-23 DIAGNOSIS — R808 Other proteinuria: Secondary | ICD-10-CM | POA: Diagnosis not present

## 2022-12-23 DIAGNOSIS — N179 Acute kidney failure, unspecified: Secondary | ICD-10-CM | POA: Diagnosis not present

## 2022-12-23 DIAGNOSIS — I10 Essential (primary) hypertension: Secondary | ICD-10-CM | POA: Diagnosis not present

## 2022-12-25 ENCOUNTER — Ambulatory Visit: Payer: Medicare Other | Admitting: Dermatology

## 2022-12-26 DIAGNOSIS — L930 Discoid lupus erythematosus: Secondary | ICD-10-CM | POA: Diagnosis not present

## 2022-12-26 DIAGNOSIS — M329 Systemic lupus erythematosus, unspecified: Secondary | ICD-10-CM | POA: Diagnosis not present

## 2022-12-26 DIAGNOSIS — B370 Candidal stomatitis: Secondary | ICD-10-CM | POA: Diagnosis not present

## 2022-12-26 DIAGNOSIS — Z79899 Other long term (current) drug therapy: Secondary | ICD-10-CM | POA: Diagnosis not present

## 2022-12-26 DIAGNOSIS — I7300 Raynaud's syndrome without gangrene: Secondary | ICD-10-CM | POA: Diagnosis not present

## 2022-12-29 ENCOUNTER — Other Ambulatory Visit: Payer: Self-pay | Admitting: Physician Assistant

## 2022-12-29 DIAGNOSIS — Z3041 Encounter for surveillance of contraceptive pills: Secondary | ICD-10-CM

## 2023-01-02 ENCOUNTER — Other Ambulatory Visit: Payer: Self-pay | Admitting: Physician Assistant

## 2023-01-02 DIAGNOSIS — J4520 Mild intermittent asthma, uncomplicated: Secondary | ICD-10-CM

## 2023-01-15 ENCOUNTER — Ambulatory Visit (INDEPENDENT_AMBULATORY_CARE_PROVIDER_SITE_OTHER): Payer: Medicare Other

## 2023-01-15 DIAGNOSIS — E538 Deficiency of other specified B group vitamins: Secondary | ICD-10-CM

## 2023-01-15 MED ORDER — CYANOCOBALAMIN 1000 MCG/ML IJ SOLN
1000.0000 ug | Freq: Once | INTRAMUSCULAR | Status: AC
  Administered 2023-01-15: 1000 ug via INTRAMUSCULAR

## 2023-01-16 DIAGNOSIS — M79661 Pain in right lower leg: Secondary | ICD-10-CM | POA: Diagnosis not present

## 2023-01-16 DIAGNOSIS — M25511 Pain in right shoulder: Secondary | ICD-10-CM | POA: Diagnosis not present

## 2023-01-16 DIAGNOSIS — M79662 Pain in left lower leg: Secondary | ICD-10-CM | POA: Diagnosis not present

## 2023-01-16 DIAGNOSIS — Z79891 Long term (current) use of opiate analgesic: Secondary | ICD-10-CM | POA: Diagnosis not present

## 2023-01-16 DIAGNOSIS — M25512 Pain in left shoulder: Secondary | ICD-10-CM | POA: Diagnosis not present

## 2023-01-16 DIAGNOSIS — G894 Chronic pain syndrome: Secondary | ICD-10-CM | POA: Diagnosis not present

## 2023-01-17 ENCOUNTER — Other Ambulatory Visit: Payer: Self-pay | Admitting: Physician Assistant

## 2023-01-17 DIAGNOSIS — E559 Vitamin D deficiency, unspecified: Secondary | ICD-10-CM

## 2023-01-23 ENCOUNTER — Other Ambulatory Visit: Payer: Self-pay

## 2023-01-23 DIAGNOSIS — L930 Discoid lupus erythematosus: Secondary | ICD-10-CM

## 2023-01-23 MED ORDER — MOMETASONE FUROATE 0.1 % EX SOLN: CUTANEOUS | 0 refills | Status: DC

## 2023-01-23 NOTE — Progress Notes (Signed)
Refill request faxed from [facility]. [employer]  

## 2023-01-24 ENCOUNTER — Other Ambulatory Visit: Payer: Self-pay | Admitting: Physician Assistant

## 2023-01-24 DIAGNOSIS — J4520 Mild intermittent asthma, uncomplicated: Secondary | ICD-10-CM

## 2023-01-27 ENCOUNTER — Other Ambulatory Visit: Payer: Self-pay | Admitting: Internal Medicine

## 2023-01-27 DIAGNOSIS — I10 Essential (primary) hypertension: Secondary | ICD-10-CM | POA: Diagnosis not present

## 2023-01-27 DIAGNOSIS — R808 Other proteinuria: Secondary | ICD-10-CM | POA: Diagnosis not present

## 2023-01-27 DIAGNOSIS — J4541 Moderate persistent asthma with (acute) exacerbation: Secondary | ICD-10-CM

## 2023-01-27 DIAGNOSIS — N179 Acute kidney failure, unspecified: Secondary | ICD-10-CM | POA: Diagnosis not present

## 2023-01-27 NOTE — Telephone Encounter (Signed)
Can you please that she taking

## 2023-01-28 ENCOUNTER — Other Ambulatory Visit: Payer: Self-pay

## 2023-01-28 DIAGNOSIS — K130 Diseases of lips: Secondary | ICD-10-CM

## 2023-01-28 MED ORDER — TACROLIMUS 0.1 % EX OINT: TOPICAL_OINTMENT | CUTANEOUS | 0 refills | Status: DC

## 2023-01-28 NOTE — Progress Notes (Signed)
Refill request faxed from [facility]. [employer]  

## 2023-02-03 DIAGNOSIS — M329 Systemic lupus erythematosus, unspecified: Secondary | ICD-10-CM | POA: Diagnosis not present

## 2023-02-03 DIAGNOSIS — R808 Other proteinuria: Secondary | ICD-10-CM | POA: Diagnosis not present

## 2023-02-03 DIAGNOSIS — I10 Essential (primary) hypertension: Secondary | ICD-10-CM | POA: Diagnosis not present

## 2023-02-11 ENCOUNTER — Ambulatory Visit
Admission: RE | Admit: 2023-02-11 | Discharge: 2023-02-11 | Disposition: A | Discharge status: Home / Self Care | Payer: Medicare Other | Source: Ambulatory Visit | Attending: Internal Medicine | Admitting: Internal Medicine

## 2023-02-11 DIAGNOSIS — J439 Emphysema, unspecified: Secondary | ICD-10-CM | POA: Diagnosis not present

## 2023-02-11 DIAGNOSIS — R590 Localized enlarged lymph nodes: Secondary | ICD-10-CM

## 2023-02-11 DIAGNOSIS — R222 Localized swelling, mass and lump, trunk: Secondary | ICD-10-CM | POA: Insufficient documentation

## 2023-02-11 MED ORDER — IOHEXOL 300 MG/ML  SOLN
75.0000 mL | Freq: Once | INTRAMUSCULAR | Status: AC | PRN
  Administered 2023-02-11: 75 mL via INTRAVENOUS

## 2023-02-13 DIAGNOSIS — Z79891 Long term (current) use of opiate analgesic: Secondary | ICD-10-CM | POA: Diagnosis not present

## 2023-02-13 DIAGNOSIS — G894 Chronic pain syndrome: Secondary | ICD-10-CM | POA: Diagnosis not present

## 2023-02-18 ENCOUNTER — Encounter: Payer: Self-pay | Admitting: Internal Medicine

## 2023-02-18 ENCOUNTER — Inpatient Hospital Stay: Payer: Medicare Other | Attending: Internal Medicine | Admitting: Internal Medicine

## 2023-02-18 VITALS — BP 108/75 | HR 100 | Temp 98.6°F | Resp 17 | Wt 231.0 lb

## 2023-02-18 DIAGNOSIS — R9389 Abnormal findings on diagnostic imaging of other specified body structures: Secondary | ICD-10-CM | POA: Diagnosis not present

## 2023-02-18 DIAGNOSIS — Z79899 Other long term (current) drug therapy: Secondary | ICD-10-CM | POA: Diagnosis not present

## 2023-02-18 DIAGNOSIS — Z79621 Long term (current) use of calcineurin inhibitor: Secondary | ICD-10-CM | POA: Diagnosis not present

## 2023-02-18 DIAGNOSIS — I10 Essential (primary) hypertension: Secondary | ICD-10-CM | POA: Insufficient documentation

## 2023-02-18 DIAGNOSIS — R599 Enlarged lymph nodes, unspecified: Secondary | ICD-10-CM | POA: Diagnosis not present

## 2023-02-18 DIAGNOSIS — G4733 Obstructive sleep apnea (adult) (pediatric): Secondary | ICD-10-CM | POA: Diagnosis not present

## 2023-02-18 DIAGNOSIS — M329 Systemic lupus erythematosus, unspecified: Secondary | ICD-10-CM | POA: Diagnosis not present

## 2023-02-18 DIAGNOSIS — Z7951 Long term (current) use of inhaled steroids: Secondary | ICD-10-CM | POA: Insufficient documentation

## 2023-02-18 DIAGNOSIS — J4489 Other specified chronic obstructive pulmonary disease: Secondary | ICD-10-CM | POA: Insufficient documentation

## 2023-02-18 NOTE — Progress Notes (Signed)
Patient here for oncology follow-up appointment,  concerns of random outbreaks of head sweating and head pain

## 2023-02-18 NOTE — Progress Notes (Signed)
[facility]  [phone] [phone]  ID: Morgan Kent OB: 10/26/1981  MR#: [id number]  TIR#:[id number]  Patient Care Team: Alan Ripper as PCP - General (Physician Assistant) Antonieta Iba, MD as Consulting Physician (Cardiology)   HPI: Morgan Kent is a 42 y.o. female with past medical history of SLE follows with Dr. Angie Fava at [place] on Plaquenil, OSA, Debroah Loop Chiari malformation status post craniotomy in 2015 was referred to hematology for incidental finding of bilateral enlarged axillary and subpectoral lymph nodes.  Patient follows with Dr. Welton Flakes of pulmonary for OSA and obstructive chronic bronchitis. Recently, while undergoing PFTs she was having difficulty breathing.  CT chest was done on 07/09/2022 which showed incidental finding of focal encapsulated fat with minimal internal wispy soft tissue along along the right heart border measuring 1.7 x 3.8 cm.  Follow-up CT in 6 to 12 months was recommended to ensure stability as low-grade liposarcoma cannot be definitely ruled out. Also, numerous subpectoral and axillary lymph nodes measuring up to 10 mm bilaterally were seen.  Interval history- Patient was seen today as a 75-month follow-up to review CT scan results. She was recently started on prednisone due to lupus flare.  She was also seen by Dr. Thedore Mins for slight increase in creatinine level. She expressed concerns about sweating from her scalp which has occurred on 3 occasions.  She is scheduled to see Dr. Gwen Pounds from dermatology in May.  REVIEW OF SYSTEMS:   ROS  As per HPI. Otherwise, a complete review of systems is negative.  PAST MEDICAL HISTORY: Past Medical History:  Diagnosis Date   Anxiety    Arthritis    rheumo possible   Chiari malformation    Depression    Dizziness    Headache(784.0)    History of kidney stones    Hypertension    Lupus (HCC)    Lupus (HCC)    Nerve damage    NECK   PONV  (postoperative nausea and vomiting)    nausea only   Sleep apnea    TMJ arthritis     PAST SURGICAL HISTORY: Past Surgical History:  Procedure Laterality Date   carpel tunnel  Left 2014   CRANIOTOMY  14,06   chiari   DENTAL SURGERY     HERNIA REPAIR     umbilical   PLACEMENT OF LUMBAR DRAIN N/A 07/07/2014   Procedure: PLACEMENT OF LUMBAR DRAIN AND CLOSURE OF CERVICAL INCISION;  Surgeon: Tressie Stalker, MD;  Location: [facility];  Service: Neurosurgery;  Laterality: N/A;   SUBOCCIPITAL CRANIECTOMY CERVICAL LAMINECTOMY N/A 09/29/2013   Procedure: SUBOCCIPITAL CRANIECTOMY CERVICAL LAMINECTOMY/DURAPLASTY;  Surgeon: Cristi Loron, MD;  Location: [facility];  Service: Neurosurgery;  Laterality: N/A;  posterior   SUBOCCIPITAL CRANIECTOMY CERVICAL LAMINECTOMY N/A 06/23/2014   Procedure: SUBOCCIPITAL CRANIECTOMY CERVICAL LAMINECTOMY/DURAPLASTY;  Surgeon: Tressie Stalker, MD;  Location: [facility];  Service: Neurosurgery;  Laterality: N/A;  suboccipital craniectomy with cervical laminectomy and duraplasty    FAMILY HISTORY: Family History  Problem Relation Age of Onset   Heart disease Father    Hypertension Father    Hypertension Mother    Thyroid disease Mother    Asthma Other    Depression Other    Diabetes Other    Migraines Other    Stroke Other     HEALTH MAINTENANCE: Social History   Tobacco Use   Smoking status: Never   Smokeless tobacco: Never  Vaping Use   Vaping Use: Never  used  Substance Use Topics   Alcohol use: No    Alcohol/week: 0.0 standard drinks of alcohol   Drug use: No     Allergies  Allergen Reactions   Misc. Sulfonamide Containing Compounds    Other Other (See Comments)   Sulfa Antibiotics    Cephalexin Itching    Per [facility] records    Current Outpatient Medications  Medication Sig Dispense Refill   albuterol (VENTOLIN HFA) 108 (90 Base) MCG/ACT inhaler INHALE 2 PUFFS INTO THE LUNGS DAILY 18 g 5   amLODipine (NORVASC) 5 MG  tablet TAKE 1 TABLET(5 MG) BY MOUTH DAILY 90 tablet 3   Budeson-Glycopyrrol-Formoterol (BREZTRI AEROSPHERE) 160-9-4.8 MCG/ACT AERO INHALE 2 PUFFS BY MOUTH INTO THE LUNGS TWICE DAILY 32.1 g 1   carvedilol (COREG) 25 MG tablet TAKE 1 TABLET(25 MG) BY MOUTH TWICE DAILY 180 tablet 3   cetirizine (ZYRTEC) 10 MG tablet Take 1 tablet (10 mg total) by mouth daily. 30 tablet 5   clobetasol (TEMOVATE) 0.05 % external solution Apply 1 application. topically 2 (two) times daily as needed. Apply to scalp 50 mL 2   Cyanocobalamin (B-12 COMPLIANCE INJECTION) 1000 MCG/ML KIT Inject as directed.     cyclobenzaprine (FLEXERIL) 10 MG tablet Take 1 tablet (10 mg total) by mouth 3 (three) times daily as needed. 30 tablet 1   Dapsone 7.5 % GEL APPLY TOPICALLY TO THE AFFECTED AREA EVERY DAY 90 g 1   diclofenac Sodium (VOLTAREN) 1 % GEL APPLY 2 GRAMS TOPICALLY TO THE AFFECTED AREA FOUR TIMES DAILY 300 g 1   FEROSUL 325 (65 Fe) MG tablet TAKE 1 TABLET BY MOUTH DAILY WITH BREAKFAST 90 tablet 1   fluconazole (DIFLUCAN) 200 MG tablet Take 200 mg by mouth once.     fluticasone (FLONASE) 50 MCG/ACT nasal spray USE 1 SPRAY IRN AT BEDTIME 48 g 3   folic acid (FOLVITE) 1 MG tablet Take 1 mg by mouth daily.     gabapentin (NEURONTIN) 300 MG capsule TAKE 1 CAPSULE(300 MG) BY MOUTH THREE TIMES DAILY 270 capsule 1   hydrochlorothiazide (HYDRODIURIL) 12.5 MG tablet Take 1 tablet (12.5 mg total) by mouth daily. 90 tablet 3   hydroxychloroquine (PLAQUENIL) 200 MG tablet Take 1 tablet (200 mg total) by mouth daily. 30 tablet 2   JUNEL 1.5/30 1.5-30 MG-MCG tablet TAKE 1 TABLET BY MOUTH DAILY 21 tablet 3   ketoconazole (NIZORAL) 2 % shampoo 1-2 times a week lather on scalp, leave on 5-8 minutes, rinse well 120 mL 11   lisinopril (ZESTRIL) 5 MG tablet Take 1 tablet (5 mg total) by mouth daily. 90 tablet 3   meloxicam (MOBIC) 7.5 MG tablet TAKE 1 TABLET(7.5 MG) BY MOUTH TWICE DAILY 180 tablet 1   methotrexate (RHEUMATREX) 2.5 MG tablet Take  2.5 mg by mouth once a week. Caution:Chemotherapy. Protect from light.     mometasone (ELOCON) 0.1 % lotion APPLY TOPICALLY TO THE AFFECTED AREA DAILY 60 mL 0   montelukast (SINGULAIR) 10 MG tablet TAKE 1 TABLET(10 MG) BY MOUTH AT BEDTIME 90 tablet 3   mupirocin ointment (BACTROBAN) 2 % APPLY EXTERNALLY TO OPEN AREA ON SKIN ONCE TO TWICE DAILY 22 g 5   nitrofurantoin, macrocrystal-monohydrate, (MACROBID) 100 MG capsule Take 100 mg by mouth 2 (two) times daily.     NON FORMULARY CPAP with AHP     nystatin (MYCOSTATIN) 100000 UNIT/ML suspension Take 5 mLs by mouth 4 (four) times daily.     oxyCODONE-acetaminophen (PERCOCET) 7.5-325 MG tablet  Take 1 tablet by mouth every 4 (four) hours as needed for severe pain.     Polyethyl Glycol-Propyl Glycol (SYSTANE ULTRA) 0.4-0.3 % SOLN Place 1 drop into both eyes daily as needed (for dry eyes).      tacrolimus (PROTOPIC) 0.1 % ointment twice daily to corners of mouth as needed 100 g 0   triamcinolone cream (KENALOG) 0.1 % APPLY EXTERNALLY TO THE AFFECTED AREA TWICE DAILY AS NEEDED FOR UP TO 5 DAYS A WEEK 80 g 2   Vitamin D, Ergocalciferol, (DRISDOL) 1.25 MG (50000 UNIT) CAPS capsule TAKE 1 CAPSULE BY MOUTH EVERY WEEK 12 capsule 3   amoxicillin-clavulanate (AUGMENTIN) 875-125 MG tablet Take 1 tablet by mouth 2 (two) times daily. Take with food. (Patient not taking: Reported on 10/22/2022) 20 tablet 0   ketoconazole (NIZORAL) 2 % shampoo APPLY 1 APPLICATION TOPICALLY EVERY 3 DAYS. LET SIT 5 MINUTES BEFORE RINSING OUT (Patient not taking: Reported on 10/22/2022) 120 mL 11   mometasone (ELOCON) 0.1 % ointment APPLY TOPICALLY TO SORES ON FACE DAILY AS NEEDED FOR FLARES (Patient not taking: Reported on 10/22/2022) 45 g 6   No current facility-administered medications for this visit.    OBJECTIVE: There were no vitals filed for this visit.    There is no height or weight on file to calculate BMI.      General: Well-developed, well-nourished, no acute  distress. Eyes: Pink conjunctiva, anicteric sclera. HEENT: Normocephalic, moist mucous membranes, clear oropharnyx. Lungs: Clear to auscultation bilaterally. Heart: Regular rate and rhythm. No rubs, murmurs, or gallops. Abdomen: Soft, nontender, nondistended. No organomegaly noted, normoactive bowel sounds. Musculoskeletal: No edema, cyanosis, or clubbing. Neuro: Alert, answering all questions appropriately. Cranial nerves grossly intact. Skin: No rashes or petechiae noted. Psych: Normal affect. Lymphatics: No cervical, calvicular, axillary or inguinal LAD.   LAB RESULTS:  Lab Results  Component Value Date   NA 137 08/19/2022   K 4.3 08/19/2022   CL 106 08/19/2022   CO2 24 08/19/2022   GLUCOSE 82 08/19/2022   BUN 12 08/19/2022   CREATININE 1.24 (H) 08/19/2022   CALCIUM 9.0 08/19/2022   PROT 8.0 08/19/2022   ALBUMIN 3.7 08/19/2022   AST 23 08/19/2022   ALT 16 08/19/2022   ALKPHOS 41 08/19/2022   BILITOT 0.5 08/19/2022   GFRNONAA 56 (L) 08/19/2022   GFRAA 58 (L) 10/29/2019    Lab Results  Component Value Date   WBC 5.9 08/19/2022   NEUTROABS 4.0 08/19/2022   HGB 10.1 (L) 08/19/2022   HCT 29.4 (L) 08/19/2022   MCV 91.9 08/19/2022   PLT 308 08/19/2022    Lab Results  Component Value Date   TIBC 335 06/12/2022   TIBC 275 02/26/2021   TIBC 269 10/29/2019   FERRITIN 263 (H) 06/12/2022   FERRITIN 215 (H) 02/26/2021   FERRITIN 83 10/29/2019   IRONPCTSAT 20 06/12/2022   IRONPCTSAT 16 02/26/2021   IRONPCTSAT 19 10/29/2019     STUDIES: CT Chest W Contrast  Result Date: 02/12/2023 CLINICAL DATA:  Follow up pericardial lipoma EXAM: CT CHEST WITH CONTRAST TECHNIQUE: Multidetector CT imaging of the chest was performed during intravenous contrast administration. RADIATION DOSE REDUCTION: This exam was performed according to the departmental dose-optimization program which includes automated exposure control, adjustment of the mA and/or kV according to patient size and/or  use of iterative reconstruction technique. CONTRAST:  75mL OMNIPAQUE IOHEXOL 300 MG/ML  SOLN COMPARISON:  07/09/2022 FINDINGS: Cardiovascular: No significant vascular findings. Normal heart size. No pericardial effusion. Fatty pericardial  nodule is stable finding consistent with a lipoma measuring 3.9 x 1.6 cm. Mediastinum/Nodes: No enlarged mediastinal, hilar, or axillary lymph nodes. Thyroid gland, trachea, and esophagus demonstrate no significant findings. Lungs/Pleura: Emphysematous scarring in the apices. A few scattered bilateral pulmonary nodules identified with the largest 5 mm 4 mm right lower lobe. These are stable findings. No further imaging follow up is necessary for this finding. No pneumonia or pulmonary edema. Upper Abdomen: No acute abnormality. Musculoskeletal: No chest wall abnormality. No acute or significant osseous findings. IMPRESSION: 1. Pericardial lipoma is unchanged. 2. Scattered tiny pulmonary nodules unchanged. No further follow-up necessary unless patient is deemed to be at high risk. 3. Minimal absence edematous scarring. 4. No acute cardiopulmonary process. Electronically Signed   By: Layla MawJoshua  Pleasure M.D.   On: 02/12/2023 12:47    ASSESSMENT AND PLAN:   Morgan Kent is a 42 y.o. female with pmh of SLE follows with Dr. Angie Favaefoor at [facility] on Plaquenil, OSA, Debroah LoopArnold Chiari malformation status post craniotomy in 2015 was referred to hematology for incidental finding of bilateral enlarged axillary and subpectoral lymph nodes.  # Pericardial nodule #Bilateral mildly enlarged axillary and subpectoral lymph nodes  - Patient follows with Dr. Welton FlakesKhan of pulmonary for OSA and obstructive chronic bronchitis. CT chest was done on 07/09/2022 showed incidental finding of focal encapsulated fat with minimal internal wispy soft tissue along along the right heart border measuring 1.7 x 3.8 cm.  Follow-up CT in 6 to 12 months was recommended to ensure stability as low-grade liposarcoma cannot be  definitely ruled out. Also, numerous subpectoral and axillary lymph nodes measuring up to 10 mm bilaterally were seen.  -Repeat CT chest with contrast (02/11/2023) showed stable fatty pericardial nodule consistent with lipoma.  No mediastinal, hilar or axillary lymph nodes were noted.  There are few bilateral pulmonary nodules which are unchanged and does not need further follow-up. Mammogram reviewed from 09/10/2022 which was negative.  -Above imaging findings were discussed with the patient.  Small lymph nodes visible on the prior scan has resolved suggestive of likely reactive etiology from her underlying autoimmune problem.  She does not have any personal history of smoking.  No further CT follow-up is required for the pulmonary nodules and pericardial nodule.  RTC PRN  Patient expressed understanding and was in agreement with this plan. She also understands that She can call clinic at any time with any questions, concerns, or complaints.   I spent a total of 30 minutes reviewing chart data, face-to-face evaluation with the patient, counseling and coordination of care as detailed above.  Michaelyn BarterKavita Gwendelyn Lanting, MD   02/18/2023 11:00 AM

## 2023-02-20 ENCOUNTER — Encounter: Payer: Self-pay | Admitting: Physician Assistant

## 2023-02-20 ENCOUNTER — Ambulatory Visit (INDEPENDENT_AMBULATORY_CARE_PROVIDER_SITE_OTHER): Payer: Medicare Other | Admitting: Physician Assistant

## 2023-02-20 VITALS — BP 110/97 | HR 83 | Temp 97.8°F | Resp 16 | Ht 66.0 in | Wt 230.4 lb

## 2023-02-20 DIAGNOSIS — Z3041 Encounter for surveillance of contraceptive pills: Secondary | ICD-10-CM | POA: Diagnosis not present

## 2023-02-20 DIAGNOSIS — M792 Neuralgia and neuritis, unspecified: Secondary | ICD-10-CM

## 2023-02-20 DIAGNOSIS — D509 Iron deficiency anemia, unspecified: Secondary | ICD-10-CM

## 2023-02-20 DIAGNOSIS — E538 Deficiency of other specified B group vitamins: Secondary | ICD-10-CM

## 2023-02-20 DIAGNOSIS — J4520 Mild intermittent asthma, uncomplicated: Secondary | ICD-10-CM

## 2023-02-20 DIAGNOSIS — R946 Abnormal results of thyroid function studies: Secondary | ICD-10-CM

## 2023-02-20 DIAGNOSIS — M064 Inflammatory polyarthropathy: Secondary | ICD-10-CM | POA: Diagnosis not present

## 2023-02-20 DIAGNOSIS — E559 Vitamin D deficiency, unspecified: Secondary | ICD-10-CM | POA: Diagnosis not present

## 2023-02-20 DIAGNOSIS — J309 Allergic rhinitis, unspecified: Secondary | ICD-10-CM

## 2023-02-20 DIAGNOSIS — I10 Essential (primary) hypertension: Secondary | ICD-10-CM | POA: Diagnosis not present

## 2023-02-20 DIAGNOSIS — E782 Mixed hyperlipidemia: Secondary | ICD-10-CM | POA: Diagnosis not present

## 2023-02-20 MED ORDER — DICLOFENAC SODIUM 1 % EX GEL: CUTANEOUS | 1 refills | Status: AC

## 2023-02-20 MED ORDER — VITAMIN D (ERGOCALCIFEROL) 1.25 MG (50000 UNIT) PO CAPS: ORAL_CAPSULE | ORAL | 3 refills | Status: AC

## 2023-02-20 MED ORDER — FLUTICASONE PROPIONATE 50 MCG/ACT NA SUSP: NASAL | 3 refills | Status: DC

## 2023-02-20 MED ORDER — GABAPENTIN 300 MG PO CAPS: ORAL_CAPSULE | ORAL | 1 refills | Status: DC

## 2023-02-20 MED ORDER — NORETHINDRONE ACET-ETHINYL EST 1.5-30 MG-MCG PO TABS: 1.0000 | ORAL_TABLET | Freq: Every day | ORAL | 3 refills | Status: DC

## 2023-02-20 MED ORDER — FERROUS SULFATE 325 (65 FE) MG PO TABS: 325.0000 mg | ORAL_TABLET | Freq: Every day | ORAL | 1 refills | Status: DC

## 2023-02-20 MED ORDER — ALBUTEROL SULFATE HFA 108 (90 BASE) MCG/ACT IN AERS: 2.0000 | INHALATION_SPRAY | Freq: Every day | RESPIRATORY_TRACT | 5 refills | Status: DC

## 2023-02-20 NOTE — Progress Notes (Signed)
[facility] [address] [place], [place] [address]  Internal MEDICINE  Office Visit Note  Patient Name: Morgan Kent  [id number]  [id number]  Date of Service: 02/20/2023  Chief Complaint  Patient presents with   Follow-up    Patient has questions regarding B12s.   Depression   Hypertension   Hair/Scalp Problem    Patient's scalp has been sweating. Has has 3 episodes, first episode was 1 month ago.    HPI Pt is here for routine follow up -Finished up a steroid from rheumatology from lupus flare -Last month had a few instance of scalp sweating, not exercising or hot when it occurred. Unsure if just while on steroid, did also gain weight on this as well. Not sure if having some early menopausal symptoms as well. She states dermatology mentioned a condition but not sure what its called. Possible hyperhidrosis vs HS. -Oncology follow up yesterday for CT chest follow up and states she was released to follow up prn now -Nephrology following and monitoring labs -BP not checked recently, but has been ok at other offices -Just took BP meds before coming into office, forgot earlier this morning,  -Will order labs not already done by other providers for CPE -Needs multiple refills  Current Medication: Outpatient Encounter Medications as of 02/20/2023  Medication Sig   amLODipine (NORVASC) 5 MG tablet TAKE 1 TABLET(5 MG) BY MOUTH DAILY   Budeson-Glycopyrrol-Formoterol (BREZTRI AEROSPHERE) 160-9-4.8 MCG/ACT AERO INHALE 2 PUFFS BY MOUTH INTO THE LUNGS TWICE DAILY   carvedilol (COREG) 25 MG tablet TAKE 1 TABLET(25 MG) BY MOUTH TWICE DAILY   cetirizine (ZYRTEC) 10 MG tablet Take 1 tablet (10 mg total) by mouth daily.   clobetasol (TEMOVATE) 0.05 % external solution Apply 1 application. topically 2 (two) times daily as needed. Apply to scalp   Cyanocobalamin (B-12 COMPLIANCE INJECTION) 1000 MCG/ML KIT Inject as directed.   cyclobenzaprine (FLEXERIL) 10 MG tablet Take 1 tablet (10  mg total) by mouth 3 (three) times daily as needed.   Dapsone 7.5 % GEL APPLY TOPICALLY TO THE AFFECTED AREA EVERY DAY   fluconazole (DIFLUCAN) 200 MG tablet Take 200 mg by mouth once.   folic acid (FOLVITE) 1 MG tablet Take 1 mg by mouth daily.   hydrochlorothiazide (HYDRODIURIL) 12.5 MG tablet Take 1 tablet (12.5 mg total) by mouth daily.   hydroxychloroquine (PLAQUENIL) 200 MG tablet Take 1 tablet (200 mg total) by mouth daily.   ketoconazole (NIZORAL) 2 % shampoo APPLY 1 APPLICATION TOPICALLY EVERY 3 DAYS. LET SIT 5 MINUTES BEFORE RINSING OUT   ketoconazole (NIZORAL) 2 % shampoo 1-2 times a week lather on scalp, leave on 5-8 minutes, rinse well   lisinopril (ZESTRIL) 5 MG tablet Take 1 tablet (5 mg total) by mouth daily.   meloxicam (MOBIC) 7.5 MG tablet TAKE 1 TABLET(7.5 MG) BY MOUTH TWICE DAILY   methotrexate (RHEUMATREX) 2.5 MG tablet Take 2.5 mg by mouth once a week. Caution:Chemotherapy. Protect from light.   mometasone (ELOCON) 0.1 % lotion APPLY TOPICALLY TO THE AFFECTED AREA DAILY   mometasone (ELOCON) 0.1 % ointment APPLY TOPICALLY TO SORES ON FACE DAILY AS NEEDED FOR FLARES   montelukast (SINGULAIR) 10 MG tablet TAKE 1 TABLET(10 MG) BY MOUTH AT BEDTIME   mupirocin ointment (BACTROBAN) 2 % APPLY EXTERNALLY TO OPEN AREA ON SKIN ONCE TO TWICE DAILY   nitrofurantoin, macrocrystal-monohydrate, (MACROBID) 100 MG capsule Take 100 mg by mouth 2 (two) times daily.   NON FORMULARY CPAP with AHP  oxyCODONE-acetaminophen (PERCOCET) 7.5-325 MG tablet Take 1 tablet by mouth every 4 (four) hours as needed for severe pain.   Polyethyl Glycol-Propyl Glycol (SYSTANE ULTRA) 0.4-0.3 % SOLN Place 1 drop into both eyes daily as needed (for dry eyes).    tacrolimus (PROTOPIC) 0.1 % ointment twice daily to corners of mouth as needed   triamcinolone cream (KENALOG) 0.1 % APPLY EXTERNALLY TO THE AFFECTED AREA TWICE DAILY AS NEEDED FOR UP TO 5 DAYS A WEEK   [DISCONTINUED] albuterol (VENTOLIN HFA) 108 (90  Base) MCG/ACT inhaler INHALE 2 PUFFS INTO THE LUNGS DAILY   [DISCONTINUED] amoxicillin-clavulanate (AUGMENTIN) 875-125 MG tablet Take 1 tablet by mouth 2 (two) times daily. Take with food.   [DISCONTINUED] diclofenac Sodium (VOLTAREN) 1 % GEL APPLY 2 GRAMS TOPICALLY TO THE AFFECTED AREA FOUR TIMES DAILY   [DISCONTINUED] FEROSUL 325 (65 Fe) MG tablet TAKE 1 TABLET BY MOUTH DAILY WITH BREAKFAST   [DISCONTINUED] fluticasone (FLONASE) 50 MCG/ACT nasal spray USE 1 SPRAY IRN AT BEDTIME   [DISCONTINUED] gabapentin (NEURONTIN) 300 MG capsule TAKE 1 CAPSULE(300 MG) BY MOUTH THREE TIMES DAILY   [DISCONTINUED] JUNEL 1.5/30 1.5-30 MG-MCG tablet TAKE 1 TABLET BY MOUTH DAILY   [DISCONTINUED] nystatin (MYCOSTATIN) 100000 UNIT/ML suspension Take 5 mLs by mouth 4 (four) times daily.   [DISCONTINUED] Vitamin D, Ergocalciferol, (DRISDOL) 1.25 MG (50000 UNIT) CAPS capsule TAKE 1 CAPSULE BY MOUTH EVERY WEEK   albuterol (VENTOLIN HFA) 108 (90 Base) MCG/ACT inhaler Inhale 2 puffs into the lungs daily.   diclofenac Sodium (VOLTAREN) 1 % GEL APPLY 2 GRAMS TOPICALLY TO THE AFFECTED AREA FOUR TIMES DAILY   ferrous sulfate (FEROSUL) 325 (65 FE) MG tablet Take 1 tablet (325 mg total) by mouth daily with breakfast.   fluticasone (FLONASE) 50 MCG/ACT nasal spray USE 1 SPRAY IRN AT BEDTIME   gabapentin (NEURONTIN) 300 MG capsule TAKE 1 CAPSULE(300 MG) BY MOUTH THREE TIMES DAILY   Norethindrone Acetate-Ethinyl Estradiol (JUNEL 1.5/30) 1.5-30 MG-MCG tablet Take 1 tablet by mouth daily.   Vitamin D, Ergocalciferol, (DRISDOL) 1.25 MG (50000 UNIT) CAPS capsule TAKE 1 CAPSULE BY MOUTH EVERY WEEK   No facility-administered encounter medications on file as of 02/20/2023.    Surgical History: Past Surgical History:  Procedure Laterality Date   carpel tunnel  Left 2014   CRANIOTOMY  14,06   chiari   DENTAL SURGERY     HERNIA REPAIR     umbilical   PLACEMENT OF LUMBAR DRAIN N/A 07/07/2014   Procedure: PLACEMENT OF LUMBAR DRAIN AND  CLOSURE OF CERVICAL INCISION;  Surgeon: Tressie Stalker, MD;  Location: [facility];  Service: Neurosurgery;  Laterality: N/A;   SUBOCCIPITAL CRANIECTOMY CERVICAL LAMINECTOMY N/A 09/29/2013   Procedure: SUBOCCIPITAL CRANIECTOMY CERVICAL LAMINECTOMY/DURAPLASTY;  Surgeon: Cristi Loron, MD;  Location: [facility];  Service: Neurosurgery;  Laterality: N/A;  posterior   SUBOCCIPITAL CRANIECTOMY CERVICAL LAMINECTOMY N/A 06/23/2014   Procedure: SUBOCCIPITAL CRANIECTOMY CERVICAL LAMINECTOMY/DURAPLASTY;  Surgeon: Tressie Stalker, MD;  Location: [facility];  Service: Neurosurgery;  Laterality: N/A;  suboccipital craniectomy with cervical laminectomy and duraplasty    Medical History: Past Medical History:  Diagnosis Date   Anxiety    Arthritis    rheumo possible   Chiari malformation    Depression    Dizziness    Headache(784.0)    History of kidney stones    Hypertension    Lupus    Lupus    Nerve damage    NECK   PONV (postoperative nausea and vomiting)  nausea only   Sleep apnea    TMJ arthritis     Family History: Family History  Problem Relation Age of Onset   Heart disease Father    Hypertension Father    Hypertension Mother    Thyroid disease Mother    Asthma Other    Depression Other    Diabetes Other    Migraines Other    Stroke Other     Social History   Socioeconomic History   Marital status: Single    Spouse name: Not on file   Number of children: Not on file   Years of education: Not on file   Highest education level: Not on file  Occupational History   Not on file  Tobacco Use   Smoking status: Never   Smokeless tobacco: Never  Vaping Use   Vaping Use: Never used  Substance and Sexual Activity   Alcohol use: No    Alcohol/week: 0.0 standard drinks of alcohol   Drug use: No   Sexual activity: Not on file  Other Topics Concern   Not on file  Social History Narrative   Lives with 3 children in 2 story home.  Unemployed.  Trying to get  disability.  Education: high school.   Social Determinants of Health   Financial Resource Strain: Not on file  Food Insecurity: Not on file  Transportation Needs: Not on file  Physical Activity: Not on file  Stress: Not on file  Social Connections: Not on file  Intimate Partner Violence: Not on file      Review of Systems  Constitutional:  Negative for chills and unexpected weight change.  HENT:  Negative for congestion, rhinorrhea, sneezing and sore throat.   Eyes:  Negative for redness.  Respiratory:  Negative for cough, chest tightness and shortness of breath.   Cardiovascular:  Negative for chest pain and palpitations.  Gastrointestinal:  Negative for abdominal pain, constipation, diarrhea, nausea and vomiting.  Genitourinary:  Negative for dysuria and frequency.  Musculoskeletal:  Positive for arthralgias. Negative for back pain, joint swelling and neck pain.  Skin:  Negative for rash.  Neurological: Negative.  Negative for tremors and numbness.  Hematological:  Negative for adenopathy. Does not bruise/bleed easily.  Psychiatric/Behavioral:  Negative for behavioral problems (Depression), sleep disturbance and suicidal ideas. The patient is not nervous/anxious.     Vital Signs: BP (!) 110/97   Pulse 83   Temp 97.8 F (36.6 C)   Resp 16   Ht 5\' 6"  (1.676 m)   Wt 230 lb 6.4 oz (104.5 kg)   SpO2 95%   BMI 37.19 kg/m    Physical Exam Vitals and nursing note reviewed.  Constitutional:      Appearance: Normal appearance. She is obese.  HENT:     Head: Normocephalic and atraumatic.     Nose: Nose normal.     Mouth/Throat:     Mouth: Mucous membranes are moist.     Pharynx: No posterior oropharyngeal erythema.  Eyes:     Extraocular Movements: Extraocular movements intact.     Pupils: Pupils are equal, round, and reactive to light.  Cardiovascular:     Rate and Rhythm: Normal rate and regular rhythm.     Pulses: Normal pulses.     Heart sounds: Normal heart  sounds.  Pulmonary:     Effort: Pulmonary effort is normal.     Breath sounds: Normal breath sounds.  Chest:     Chest wall: No tenderness.  Breasts:  Right: No mass.     Left: No mass.  Abdominal:     General: Abdomen is flat.     Palpations: Abdomen is soft.  Musculoskeletal:        General: Normal range of motion.     Cervical back: Normal range of motion.  Skin:    General: Skin is warm and dry.  Neurological:     General: No focal deficit present.     Mental Status: She is alert.  Psychiatric:        Mood and Affect: Mood normal.        Behavior: Behavior normal.        Assessment/Plan: 1. Essential hypertension Elevated in office but just took meds and has been well controlled, continue to monitor  2. Vitamin D deficiency - Vitamin D, Ergocalciferol, (DRISDOL) 1.25 MG (50000 UNIT) CAPS capsule; TAKE 1 CAPSULE BY MOUTH EVERY WEEK  Dispense: 12 capsule; Refill: 3 - VITAMIN D 25 Hydroxy (Vit-D Deficiency, Fractures)  3. Iron deficiency anemia, unspecified iron deficiency anemia type - ferrous sulfate (FEROSUL) 325 (65 FE) MG tablet; Take 1 tablet (325 mg total) by mouth daily with breakfast.  Dispense: 90 tablet; Refill: 1 - Fe+TIBC+Fer  4. Mild intermittent asthma without complication - albuterol (VENTOLIN HFA) 108 (90 Base) MCG/ACT inhaler; Inhale 2 puffs into the lungs daily.  Dispense: 18 g; Refill: 5  5. Allergic rhinitis, unspecified seasonality, unspecified trigger - fluticasone (FLONASE) 50 MCG/ACT nasal spray; USE 1 SPRAY IRN AT BEDTIME  Dispense: 48 g; Refill: 3  6. Neuralgia - gabapentin (NEURONTIN) 300 MG capsule; TAKE 1 CAPSULE(300 MG) BY MOUTH THREE TIMES DAILY  Dispense: 270 capsule; Refill: 1  7. Encounter for surveillance of contraceptive pills - Norethindrone Acetate-Ethinyl Estradiol (JUNEL 1.5/30) 1.5-30 MG-MCG tablet; Take 1 tablet by mouth daily.  Dispense: 21 tablet; Refill: 3  8. Inflammatory polyarthritis - diclofenac Sodium  (VOLTAREN) 1 % GEL; APPLY 2 GRAMS TOPICALLY TO THE AFFECTED AREA FOUR TIMES DAILY  Dispense: 300 g; Refill: 1  9. B12 deficiency - B12 and Folate Panel  10. Mixed hyperlipidemia - Lipid Panel With LDL/HDL Ratio  11. Abnormal thyroid exam - TSH + free T4   General Counseling: Morgan Kent verbalizes understanding of the findings of todays visit and agrees with plan of treatment. I have discussed any further diagnostic evaluation that may be needed or ordered today. We also reviewed her medications today. she has been encouraged to call the office with any questions or concerns that should arise related to todays visit.    Orders Placed This Encounter  Procedures   TSH + free T4   Lipid Panel With LDL/HDL Ratio   VITAMIN D 25 Hydroxy (Vit-D Deficiency, Fractures)   B12 and Folate Panel   Fe+TIBC+Fer    Meds ordered this encounter  Medications   Vitamin D, Ergocalciferol, (DRISDOL) 1.25 MG (50000 UNIT) CAPS capsule    Sig: TAKE 1 CAPSULE BY MOUTH EVERY WEEK    Dispense:  12 capsule    Refill:  3    ZERO refills remain on this prescription. Your patient is requesting advance approval of refills for this medication to PREVENT ANY MISSED DOSES   ferrous sulfate (FEROSUL) 325 (65 FE) MG tablet    Sig: Take 1 tablet (325 mg total) by mouth daily with breakfast.    Dispense:  90 tablet    Refill:  1   albuterol (VENTOLIN HFA) 108 (90 Base) MCG/ACT inhaler    Sig: Inhale 2 puffs into  the lungs daily.    Dispense:  18 g    Refill:  5   fluticasone (FLONASE) 50 MCG/ACT nasal spray    Sig: USE 1 SPRAY IRN AT BEDTIME    Dispense:  48 g    Refill:  3   gabapentin (NEURONTIN) 300 MG capsule    Sig: TAKE 1 CAPSULE(300 MG) BY MOUTH THREE TIMES DAILY    Dispense:  270 capsule    Refill:  1   Norethindrone Acetate-Ethinyl Estradiol (JUNEL 1.5/30) 1.5-30 MG-MCG tablet    Sig: Take 1 tablet by mouth daily.    Dispense:  21 tablet    Refill:  3   diclofenac Sodium (VOLTAREN) 1 % GEL    Sig:  APPLY 2 GRAMS TOPICALLY TO THE AFFECTED AREA FOUR TIMES DAILY    Dispense:  300 g    Refill:  1    This patient was seen by Lynn ItoLauren Bowman Higbie, PA-C in collaboration with Dr. Beverely RisenFozia Khan as a part of collaborative care agreement.   Total time spent:30 Minutes Time spent includes review of chart, medications, test results, and follow up plan with the patient.      Dr Lyndon CodeFozia M Khan Internal medicine

## 2023-03-05 DIAGNOSIS — Z79899 Other long term (current) drug therapy: Secondary | ICD-10-CM | POA: Diagnosis not present

## 2023-03-05 DIAGNOSIS — H43813 Vitreous degeneration, bilateral: Secondary | ICD-10-CM | POA: Diagnosis not present

## 2023-03-05 DIAGNOSIS — H40003 Preglaucoma, unspecified, bilateral: Secondary | ICD-10-CM | POA: Diagnosis not present

## 2023-03-05 DIAGNOSIS — M329 Systemic lupus erythematosus, unspecified: Secondary | ICD-10-CM | POA: Diagnosis not present

## 2023-03-09 ENCOUNTER — Other Ambulatory Visit: Payer: Self-pay | Admitting: Physician Assistant

## 2023-03-09 ENCOUNTER — Other Ambulatory Visit: Payer: Self-pay | Admitting: Dermatology

## 2023-03-09 DIAGNOSIS — L930 Discoid lupus erythematosus: Secondary | ICD-10-CM

## 2023-03-09 DIAGNOSIS — L700 Acne vulgaris: Secondary | ICD-10-CM

## 2023-03-09 DIAGNOSIS — J309 Allergic rhinitis, unspecified: Secondary | ICD-10-CM

## 2023-03-12 ENCOUNTER — Ambulatory Visit (INDEPENDENT_AMBULATORY_CARE_PROVIDER_SITE_OTHER): Payer: Medicare Other | Admitting: Dermatology

## 2023-03-12 VITALS — BP 98/69

## 2023-03-12 DIAGNOSIS — K130 Diseases of lips: Secondary | ICD-10-CM

## 2023-03-12 DIAGNOSIS — Z79899 Other long term (current) drug therapy: Secondary | ICD-10-CM | POA: Diagnosis not present

## 2023-03-12 DIAGNOSIS — M721 Knuckle pads: Secondary | ICD-10-CM

## 2023-03-12 DIAGNOSIS — L930 Discoid lupus erythematosus: Secondary | ICD-10-CM | POA: Diagnosis not present

## 2023-03-12 DIAGNOSIS — L700 Acne vulgaris: Secondary | ICD-10-CM

## 2023-03-12 DIAGNOSIS — M329 Systemic lupus erythematosus, unspecified: Secondary | ICD-10-CM

## 2023-03-12 MED ORDER — FLUCONAZOLE 200 MG PO TABS: 200.0000 mg | ORAL_TABLET | Freq: Once | ORAL | 0 refills | Status: DC

## 2023-03-12 MED ORDER — DAPSONE 7.5 % EX GEL
1.00 | Freq: Every day | CUTANEOUS | 5 refills | Status: DC
Start: 2023-03-12 — End: 2023-05-19

## 2023-03-12 MED ORDER — MOMETASONE FUROATE 0.1 % EX SOLN: CUTANEOUS | 5 refills | Status: DC

## 2023-03-12 MED ORDER — MOMETASONE FUROATE 0.1 % EX OINT
TOPICAL_OINTMENT | CUTANEOUS | 6 refills | Status: DC
Start: 2023-03-12 — End: 2023-09-17

## 2023-03-12 MED ORDER — FLUCONAZOLE 200 MG PO TABS: 200.0000 mg | ORAL_TABLET | ORAL | 2 refills | Status: DC

## 2023-03-12 MED ORDER — TRIAMCINOLONE ACETONIDE 0.1 % EX CREA: 1.0000 | TOPICAL_CREAM | CUTANEOUS | 5 refills | Status: DC

## 2023-03-12 MED ORDER — MUPIROCIN 2 % EX OINT: 1.0000 | TOPICAL_OINTMENT | Freq: Every day | CUTANEOUS | 6 refills | Status: DC

## 2023-03-12 MED ORDER — TACROLIMUS 0.1 % EX OINT: TOPICAL_OINTMENT | Freq: Two times a day (BID) | CUTANEOUS | 5 refills | Status: DC

## 2023-03-12 MED ORDER — KETOCONAZOLE 2 % EX SHAM: 1.0000 | MEDICATED_SHAMPOO | CUTANEOUS | 0 refills | Status: DC

## 2023-03-12 NOTE — Patient Instructions (Addendum)
Medication for Lupus  Decrease Mometasone ointment to once daily up to 5days a week to face Continue Mometasone solution daily up to 5 days a week to affected area of scalp Decrease TMC 0.1%  cream to once daily up to 5 days  a week affected areas of  arms as needed for flares, avoid face, groin, underarms Continue Mupirocin ointment once daily to open sores Continue Ketoconazole 2% shampoo 1-2 times a week to wash scalp, let sit 5 minutes and rinse out  For Acne Continue Dapsone 7.5% gel once a day  For Cheilitis (corners of mouth) Continue Tacrolimus 0.1% ointment 1-2 times a day as needed for flares Cont Fluconazole 200mg  1 pill a week for 2 weeks as needed for yeast flares      Due to recent changes in healthcare laws, you may see results of your pathology and/or laboratory studies on MyChart before the doctors have had a chance to review them. We understand that in some cases there may be results that are confusing or concerning to you. Please understand that not all results are received at the same time and often the doctors may need to interpret multiple results in order to provide you with the best plan of care or course of treatment. Therefore, we ask that you please give [place] 2 business days to thoroughly review all your results before contacting the office for clarification. Should we see a critical lab result, you will be contacted sooner.   If You Need Anything After Your Visit  If you have any questions or concerns for your doctor, please call our main line at [phone] and press option 4 to reach your doctor's medical assistant. If no one answers, please leave a voicemail as directed and we will return your call as soon as possible. Messages left after 4 pm will be answered the following business day.   You may also send [place] a message via MyChart. We typically respond to MyChart messages within 1-2 business days.  For prescription refills, please ask your pharmacy to contact  our office. Our fax number is [phone].  If you have an urgent issue when the clinic is closed that cannot wait until the next business day, you can page your doctor at the number below.    Please note that while we do our best to be available for urgent issues outside of office hours, we are not available 24/7.   If you have an urgent issue and are unable to reach [place], you may choose to seek medical care at your doctor's office, retail clinic, urgent care center, or emergency room.  If you have a medical emergency, please immediately call 911 or go to the emergency department.  Pager Numbers  - Dr. Gwen Pounds: [phone]  - Dr. Neale Burly: [phone]  - Dr. Roseanne Reno: [phone]  In the event of inclement weather, please call our main line at [phone] for an update on the status of any delays or closures.  Dermatology Medication Tips: Please keep the boxes that topical medications come in in order to help keep track of the instructions about where and how to use these. Pharmacies typically print the medication instructions only on the boxes and not directly on the medication tubes.   If your medication is too expensive, please contact our office at [phone] option 4 or send [place] a message through MyChart.   We are unable to tell what your co-pay for medications will be in advance as this is different depending on your  insurance coverage. However, we may be able to find a substitute medication at lower cost or fill out paperwork to get insurance to cover a needed medication.   If a prior authorization is required to get your medication covered by your insurance company, please allow [place] 1-2 business days to complete this process.  Drug prices often vary depending on where the prescription is filled and some pharmacies may offer cheaper prices.  The website www.goodrx.com contains coupons for medications through different pharmacies. The prices here do not account for what the cost  may be with help from insurance (it may be cheaper with your insurance), but the website can give you the price if you did not use any insurance.  - You can print the associated coupon and take it with your prescription to the pharmacy.  - You may also stop by our office during regular business hours and pick up a GoodRx coupon card.  - If you need your prescription sent electronically to a different pharmacy, notify our office through [facility] or by phone at [phone] option 4.     Si Usted Necesita Algo Despus de Su Visita  Tambin puede enviarnos un mensaje a travs de Clinical cytogeneticist. Por lo general respondemos a los mensajes de MyChart en el transcurso de 1 a 2 das hbiles.  Para renovar recetas, por favor pida a su farmacia que se ponga en contacto con nuestra oficina. Annie Sable de fax es [place] [phone].  Si tiene un asunto urgente cuando la clnica est cerrada y que no puede esperar hasta el siguiente da hbil, puede llamar/localizar a su doctor(a) al nmero que aparece a continuacin.   Por favor, tenga en cuenta que aunque hacemos todo lo posible para estar disponibles para asuntos urgentes fuera del horario de [place], no estamos disponibles las 24 horas del da, los 7 [address]  [address] de la [place].   Si tiene un problema urgente y no puede comunicarse con nosotros, puede optar por buscar atencin mdica  en el consultorio de su doctor(a), en una clnica privada, en un centro de atencin urgente o en una sala de emergencias.  Si tiene Engineer, drilling, por favor llame inmediatamente al 911 o vaya a la sala de emergencias.  Nmeros de bper  - Dr. Gwen Pounds: [phone]  - Dra. Moye: [phone]  - Dra. Roseanne Reno: [phone]  En caso de inclemencias del [place], por favor llame a Lacy Duverney principal al [phone] para una actualizacin sobre el [place] de cualquier retraso o cierre.  Consejos para la medicacin en dermatologa: Por favor, guarde las cajas en  las que vienen los medicamentos de uso tpico para ayudarle a seguir las instrucciones sobre dnde y cmo usarlos. Las farmacias generalmente imprimen las instrucciones del medicamento slo en las cajas y no directamente en los tubos del [place].   Si su medicamento es muy caro, por favor, pngase en contacto con Rolm Gala llamando al [phone] y presione la opcin 4 o envenos un mensaje a travs de Clinical cytogeneticist.   No podemos decirle cul ser su copago por los medicamentos por adelantado ya que esto es diferente dependiendo de la cobertura de su seguro. Sin embargo, es posible que podamos encontrar un medicamento sustituto a Audiological scientist un formulario para que el seguro cubra el medicamento que se considera necesario.   Si se requiere una autorizacin previa para que su compaa de seguros [place] su medicamento, por favor permtanos de 1 a 2 das hbiles para completar [address]  Los  precios de los medicamentos varan con frecuencia dependiendo del lugar de dnde se surte la receta y alguna farmacias pueden ofrecer precios ms baratos.  El sitio web www.goodrx.com tiene cupones para medicamentos de Airline pilot. Los precios aqu no tienen en cuenta lo que podra costar con la ayuda del seguro (puede ser ms barato con su seguro), pero el sitio web puede darle el precio si no utiliz Research scientist (physical sciences).  - Puede imprimir el cupn correspondiente y llevarlo con su receta a la farmacia.  - Tambin puede pasar por nuestra oficina durante el horario de atencin regular y Charity fundraiser una tarjeta de cupones de GoodRx.  - Si necesita que su receta se enve electrnicamente a una farmacia diferente, informe a nuestra oficina a travs de MyChart de [place] o por telfono llamando al [phone] y presione la opcin 4.

## 2023-03-12 NOTE — Progress Notes (Signed)
   Follow-Up Visit   Subjective  Morgan Kent is a 42 y.o. female who presents for the following: Discoid lupus erythematosus face, arms, mometasone oint bid, mupirocin oint to open areas, TMC 0.1% cr qd arms, Ketoconazole 2% shampoo 1x/wk, mometasone lotion to scalp prn itching, pt recently on oral steroids for flare, has been off around 2 months, Acne face, Dapsone 7.5% gel prn, Angular Cheilitis with Candidia, Tacrolimus oint prn, finished Fluconazole, check knuckles - noticed dark spots  The following portions of the chart were reviewed this encounter and updated as appropriate: medications, allergies, medical history  Review of Systems:  No other skin or systemic complaints except as noted in HPI or Assessment and Plan.  Objective  Well appearing patient in no apparent distress; mood and affect are within normal limits.   A focused examination was performed of the following areas: Face, arms, hands  Relevant exam findings are noted in the Assessment and Plan.               Assessment & Plan   DISCOID LUPUS ERYTHEMATOSUS Face, scalp, arms Exam: dyspigmented patches and plaques face and arms with scarring  Treatment Plan: Decrease Mometasone oint qd up to 5d/wk prn face Cont Mometasone sol qd up to 5d/wk prn to aa scalp Decrease TMC 0.1%  cr to qd up to 5d/wk prn aa arms prn flares, avoid f/g/a Cont Mupirocin oint qd prn to open sores Cont Ketoconazole 2% shampoo 1-2x/wk, let sit 5 minutes and rinse out  Topical steroids (such as triamcinolone, fluocinolone, fluocinonide, mometasone, clobetasol, halobetasol, betamethasone, hydrocortisone) can cause thinning and lightening of the skin if they are used for too long in the same area. Your physician has selected the right strength medicine for your problem and area affected on the body. Please use your medication only as directed by your physician to prevent side effects.    Discoid lupus erythematosus  Related  Medications mometasone (ELOCON) 0.1 % ointment APPLY TOPICALLY TO SORES ON FACE DAILY up to 5 days a week AS NEEDED FOR FLARES  Acne vulgaris  Related Medications Dapsone 7.5 % GEL Apply 1 Application topically daily. Qd to face for acne  SYSTEMIC LUPUS ERYTHEMATOSUS  With Discoid lesions (see above) body Exam: pt does have joint aches Treatment Plan: Better Controlled by Yvone Neu PA at [facility] - pt taking plaquenil tablets Pt to cont f/u with Chrissie Noa Defoor PA  ACNE VULGARIS face Exam: face clear today Well controlled  Treatment Plan: Cont Dapsone 7.5% gel qhs  ANGULAR CHEILITIS with history of CANDIDIA Exam Corners of mouth clear today Treatment Plan Continue Tacrolimus 0.1% oint qd/bid prn flares of soreness; splitting Cont Fluconazole 200mg  1 po qwk x 2 wks prn flares of "yeast"    Side effects of fluconazole (diflucan) include nausea, diarrhea, headache, dizziness, taste changes, rare risk of irritation of the liver, allergy, or decreased blood counts (which could show up as infection or tiredness).   KNUCKLE PADS Exam: slight thickening and hyperpigmentation of the MCP knuckles of hands Treatment Plan: Cont Moisturizer qd   Return in about 6 months (around 09/12/2023) for DLE, SLE, Angular cheilitis, acne, .  I, Sonya Hupman, RMA, am acting as scribe for Armida Sans, MD .  Documentation: I have reviewed the above documentation for accuracy and completeness, and I agree with the above.  Armida Sans, MD

## 2023-03-16 ENCOUNTER — Other Ambulatory Visit: Payer: Self-pay | Admitting: Dermatology

## 2023-03-16 ENCOUNTER — Other Ambulatory Visit: Payer: Self-pay | Admitting: Physician Assistant

## 2023-03-16 DIAGNOSIS — M792 Neuralgia and neuritis, unspecified: Secondary | ICD-10-CM

## 2023-03-18 ENCOUNTER — Encounter: Payer: Self-pay | Admitting: Dermatology

## 2023-03-18 DIAGNOSIS — G894 Chronic pain syndrome: Secondary | ICD-10-CM | POA: Diagnosis not present

## 2023-03-18 DIAGNOSIS — Z79891 Long term (current) use of opiate analgesic: Secondary | ICD-10-CM | POA: Diagnosis not present

## 2023-03-24 ENCOUNTER — Ambulatory Visit (INDEPENDENT_AMBULATORY_CARE_PROVIDER_SITE_OTHER): Payer: Medicare Other | Admitting: Nurse Practitioner

## 2023-03-24 ENCOUNTER — Encounter: Payer: Self-pay | Admitting: Nurse Practitioner

## 2023-03-24 VITALS — BP 130/88 | HR 94 | Temp 97.6°F | Resp 16 | Ht 66.0 in | Wt 229.8 lb

## 2023-03-24 DIAGNOSIS — Z7189 Other specified counseling: Secondary | ICD-10-CM

## 2023-03-24 DIAGNOSIS — G4733 Obstructive sleep apnea (adult) (pediatric): Secondary | ICD-10-CM | POA: Diagnosis not present

## 2023-03-24 DIAGNOSIS — J4541 Moderate persistent asthma with (acute) exacerbation: Secondary | ICD-10-CM

## 2023-03-24 MED ORDER — BREZTRI AEROSPHERE 160-9-4.8 MCG/ACT IN AERO: INHALATION_SPRAY | RESPIRATORY_TRACT | 4 refills | Status: DC

## 2023-03-24 NOTE — Progress Notes (Signed)
[facility] [address] [place], [place] [address]  Internal MEDICINE  Office Visit Note  Patient Name: Morgan Kent  [id number]  [id number]  Date of Service: 03/24/2023  Chief Complaint  Patient presents with   Depression   Hypertension   Follow-up    Review CT    HPI Morgan Kent presents for a follow-up visit for pulmonary/sleep visit.  OSA on CPAP -- doing well, wakes up in the morning feeling rested. Denies any issues with daytime sleepiness or napping.  CPAP download 95 percentile pressure 6.6 95th percentile leak 2.8  apnea index 0.1 /hr apnea-hypopnea index  0.2 /hr total days used  >4 hr 26 days total days used <4 hr 3 days  Total compliance 87 percent  -- need new supplies will see about getting new order for supplies   Pt was seen by Tresa Endo  RRT/RCP  from [facility]  Asthma -- no episodes or attacks, uses rescue inhaler once a day. No hospitalizations since her last office visit. Uses breztri inhaler once daily.  Obesity-- interested in medication to help, not losing any weight with diet and lifestyle modifications.      Current Medication: Outpatient Encounter Medications as of 03/24/2023  Medication Sig   albuterol (VENTOLIN HFA) 108 (90 Base) MCG/ACT inhaler Inhale 2 puffs into the lungs daily.   amLODipine (NORVASC) 5 MG tablet TAKE 1 TABLET(5 MG) BY MOUTH DAILY   carvedilol (COREG) 25 MG tablet TAKE 1 TABLET(25 MG) BY MOUTH TWICE DAILY   cetirizine (ZYRTEC) 10 MG tablet Take 1 tablet (10 mg total) by mouth daily.   clobetasol (TEMOVATE) 0.05 % external solution Apply 1 application. topically 2 (two) times daily as needed. Apply to scalp   Cyanocobalamin (B-12 COMPLIANCE INJECTION) 1000 MCG/ML KIT Inject as directed.   cyclobenzaprine (FLEXERIL) 10 MG tablet Take 1 tablet (10 mg total) by mouth 3 (three) times daily as needed.   Dapsone 7.5 % GEL Apply 1 Application topically daily. Qd to face for acne   diclofenac Sodium (VOLTAREN) 1 % GEL APPLY 2 GRAMS  TOPICALLY TO THE AFFECTED AREA FOUR TIMES DAILY   ferrous sulfate (FEROSUL) 325 (65 FE) MG tablet Take 1 tablet (325 mg total) by mouth daily with breakfast.   fluconazole (DIFLUCAN) 200 MG tablet Take 1 tablet (200 mg total) by mouth once a week. Take 1 po 1 time a week for 2 weeks prn yeast flares around the mouth   fluticasone (FLONASE) 50 MCG/ACT nasal spray USE 1 SPRAY IN EACH NOSTRIL AT BEDTIME   folic acid (FOLVITE) 1 MG tablet Take 1 mg by mouth daily.   gabapentin (NEURONTIN) 300 MG capsule TAKE 1 CAPSULE(300 MG) BY MOUTH THREE TIMES DAILY   hydrochlorothiazide (HYDRODIURIL) 12.5 MG tablet Take 1 tablet (12.5 mg total) by mouth daily.   hydroxychloroquine (PLAQUENIL) 200 MG tablet Take 1 tablet (200 mg total) by mouth daily.   ketoconazole (NIZORAL) 2 % shampoo LATHER ON SCALP ONCE TO TWICE A WEEK, LEAVE ON 6 TO 8 MINUTES, THEN RINSE WELL   lisinopril (ZESTRIL) 5 MG tablet Take 1 tablet (5 mg total) by mouth daily.   meloxicam (MOBIC) 7.5 MG tablet TAKE 1 TABLET(7.5 MG) BY MOUTH TWICE DAILY   methotrexate (RHEUMATREX) 2.5 MG tablet Take 2.5 mg by mouth once a week. Caution:Chemotherapy. Protect from light.   mometasone (ELOCON) 0.1 % lotion Apply topically as directed. Qd up to 5 days a week to affected area of scalp as needed for flares   mometasone (  ELOCON) 0.1 % ointment APPLY TOPICALLY TO SORES ON FACE DAILY up to 5 days a week AS NEEDED FOR FLARES   montelukast (SINGULAIR) 10 MG tablet TAKE 1 TABLET(10 MG) BY MOUTH AT BEDTIME   mupirocin ointment (BACTROBAN) 2 % Apply 1 Application topically daily. Qd to open sores face, body prn flares   nitrofurantoin, macrocrystal-monohydrate, (MACROBID) 100 MG capsule Take 100 mg by mouth 2 (two) times daily.   NON FORMULARY CPAP with [facility]   Norethindrone Acetate-Ethinyl Estradiol (JUNEL 1.5/30) 1.5-30 MG-MCG tablet Take 1 tablet by mouth daily.   oxyCODONE-acetaminophen (PERCOCET) 7.5-325 MG tablet Take 1 tablet by mouth every 4 (four) hours as  needed for severe pain.   Polyethyl Glycol-Propyl Glycol (SYSTANE ULTRA) 0.4-0.3 % SOLN Place 1 drop into both eyes daily as needed (for dry eyes).    tacrolimus (PROTOPIC) 0.1 % ointment TWICE DAILY TO CORNERS AS MUCH AS NEEDED   triamcinolone cream (KENALOG) 0.1 % Apply 1 Application topically as directed. Qd up to 5 days per week to aa lupus on arms until clear, avoid face, groin, axilla   Vitamin D, Ergocalciferol, (DRISDOL) 1.25 MG (50000 UNIT) CAPS capsule TAKE 1 CAPSULE BY MOUTH EVERY WEEK   [DISCONTINUED] Budeson-Glycopyrrol-Formoterol (BREZTRI AEROSPHERE) 160-9-4.8 MCG/ACT AERO INHALE 2 PUFFS BY MOUTH INTO THE LUNGS TWICE DAILY   Budeson-Glycopyrrol-Formoterol (BREZTRI AEROSPHERE) 160-9-4.8 MCG/ACT AERO INHALE 2 PUFFS BY MOUTH INTO THE LUNGS TWICE DAILY   No facility-administered encounter medications on file as of 03/24/2023.    Surgical History: Past Surgical History:  Procedure Laterality Date   carpel tunnel  Left 2014   CRANIOTOMY  14,06   chiari   DENTAL SURGERY     HERNIA REPAIR     umbilical   PLACEMENT OF LUMBAR DRAIN N/A 07/07/2014   Procedure: PLACEMENT OF LUMBAR DRAIN AND CLOSURE OF CERVICAL INCISION;  Surgeon: Tressie Stalker, MD;  Location: [facility];  Service: Neurosurgery;  Laterality: N/A;   SUBOCCIPITAL CRANIECTOMY CERVICAL LAMINECTOMY N/A 09/29/2013   Procedure: SUBOCCIPITAL CRANIECTOMY CERVICAL LAMINECTOMY/DURAPLASTY;  Surgeon: Cristi Loron, MD;  Location: [facility];  Service: Neurosurgery;  Laterality: N/A;  posterior   SUBOCCIPITAL CRANIECTOMY CERVICAL LAMINECTOMY N/A 06/23/2014   Procedure: SUBOCCIPITAL CRANIECTOMY CERVICAL LAMINECTOMY/DURAPLASTY;  Surgeon: Tressie Stalker, MD;  Location: [facility];  Service: Neurosurgery;  Laterality: N/A;  suboccipital craniectomy with cervical laminectomy and duraplasty    Medical History: Past Medical History:  Diagnosis Date   Anxiety    Arthritis    rheumo possible   Chiari malformation    Depression     Dizziness    Headache(784.0)    History of kidney stones    Hypertension    Lupus (HCC)    Lupus (HCC)    Nerve damage    NECK   PONV (postoperative nausea and vomiting)    nausea only   Sleep apnea    TMJ arthritis     Family History: Family History  Problem Relation Age of Onset   Heart disease Father    Hypertension Father    Hypertension Mother    Thyroid disease Mother    Asthma Other    Depression Other    Diabetes Other    Migraines Other    Stroke Other     Social History   Socioeconomic History   Marital status: Single    Spouse name: Not on file   Number of children: Not on file   Years of education: Not on file   Highest education level:  Not on file  Occupational History   Not on file  Tobacco Use   Smoking status: Never   Smokeless tobacco: Never  Vaping Use   Vaping Use: Never used  Substance and Sexual Activity   Alcohol use: No    Alcohol/week: 0.0 standard drinks of alcohol   Drug use: No   Sexual activity: Not on file  Other Topics Concern   Not on file  Social History Narrative   Lives with 3 children in 2 story home.  Unemployed.  Trying to get disability.  Education: high school.   Social Determinants of Health   Financial Resource Strain: Not on file  Food Insecurity: Not on file  Transportation Needs: Not on file  Physical Activity: Not on file  Stress: Not on file  Social Connections: Not on file  Intimate Partner Violence: Not on file      Review of Systems  Constitutional:  Negative for chills, fatigue and unexpected weight change.  HENT:  Positive for postnasal drip. Negative for congestion, rhinorrhea, sneezing and sore throat.   Respiratory: Negative.  Negative for cough, chest tightness, shortness of breath and wheezing.   Cardiovascular: Negative.  Negative for chest pain and palpitations.  Gastrointestinal:  Negative for abdominal pain, constipation, diarrhea, nausea and vomiting.  Genitourinary:  Negative for  dysuria and frequency.  Musculoskeletal:  Negative for arthralgias, back pain, joint swelling and neck pain.  Skin:  Negative for rash.  Neurological: Negative.   Psychiatric/Behavioral:  Negative for behavioral problems (Depression), sleep disturbance and suicidal ideas. The patient is not nervous/anxious.     Vital Signs: BP 130/88   Pulse 94   Temp 97.6 F (36.4 C)   Resp 16   Ht 5\' 6"  (1.676 m)   Wt 229 lb 12.8 oz (104.2 kg)   SpO2 97%   BMI 37.09 kg/m    Physical Exam Vitals reviewed.  Constitutional:      General: She is not in acute distress.    Appearance: Normal appearance. She is obese. She is not ill-appearing.  HENT:     Head: Normocephalic and atraumatic.  Eyes:     Pupils: Pupils are equal, round, and reactive to light.  Cardiovascular:     Rate and Rhythm: Normal rate and regular rhythm.     Heart sounds: Normal heart sounds. No murmur heard. Pulmonary:     Effort: Pulmonary effort is normal. No respiratory distress.     Breath sounds: Normal breath sounds. No wheezing.  Neurological:     Mental Status: She is alert and oriented to person, place, and time.  Psychiatric:        Mood and Affect: Mood normal.        Behavior: Behavior normal.        Assessment/Plan: 1. Moderate persistent asthma with acute exacerbation Continue breztri as prescribed.  - Budeson-Glycopyrrol-Formoterol (BREZTRI AEROSPHERE) 160-9-4.8 MCG/ACT AERO; INHALE 2 PUFFS BY MOUTH INTO THE LUNGS TWICE DAILY  Dispense: 32.1 g; Refill: 4  2. OSA on CPAP Stable, continue nightly use of CPAP machine as instructed. Follow up with Tresa Endo RRT in about 6 months.  - For home use only DME continuous positive airway pressure (CPAP)  3. CPAP use counseling New supplies ordered. No concerns or issues with the use and maintenance of her CPAP machine  - For home use only DME continuous positive airway pressure (CPAP)  4. Obesity, morbid (HCC) Patient will discuss weight loss medications  with her PCP due to the number of  medications that she is taking and her other complex medical conditions.    General Counseling: Balinda verbalizes understanding of the findings of todays visit and agrees with plan of treatment. I have discussed any further diagnostic evaluation that may be needed or ordered today. We also reviewed her medications today. she has been encouraged to call the office with any questions or concerns that should arise related to todays visit.    Orders Placed This Encounter  Procedures   For home use only DME continuous positive airway pressure (CPAP)    Meds ordered this encounter  Medications   Budeson-Glycopyrrol-Formoterol (BREZTRI AEROSPHERE) 160-9-4.8 MCG/ACT AERO    Sig: INHALE 2 PUFFS BY MOUTH INTO THE LUNGS TWICE DAILY    Dispense:  32.1 g    Refill:  4    **Patient requests 90 days supply**    Return in about 6 months (around 09/24/2023) for F/U, pulmonary/sleep with Clyde Zarrella or [facility], make sure it is after her appt with Tresa Endo from [facility].   Total time spent:30 Minutes Time spent includes review of chart, medications, test results, and follow up plan with the patient.   [place] Controlled Substance Database was reviewed by me.  This patient was seen by Sallyanne Kuster, FNP-C in collaboration with Dr. Beverely Risen as a part of collaborative care agreement.   Zakya Halabi R. Tedd Sias, MSN, FNP-C Internal medicine

## 2023-03-25 ENCOUNTER — Telehealth: Payer: Self-pay | Admitting: Physician Assistant

## 2023-03-25 NOTE — Telephone Encounter (Signed)
Notified Beth & Sarah with [facility] of supply order-[name] 

## 2023-03-30 ENCOUNTER — Other Ambulatory Visit: Payer: Self-pay | Admitting: Physician Assistant

## 2023-03-30 DIAGNOSIS — Z3041 Encounter for surveillance of contraceptive pills: Secondary | ICD-10-CM

## 2023-03-31 ENCOUNTER — Ambulatory Visit: Payer: Medicare Other | Admitting: Internal Medicine

## 2023-04-11 DIAGNOSIS — I7300 Raynaud's syndrome without gangrene: Secondary | ICD-10-CM | POA: Diagnosis not present

## 2023-04-11 DIAGNOSIS — L930 Discoid lupus erythematosus: Secondary | ICD-10-CM | POA: Diagnosis not present

## 2023-04-11 DIAGNOSIS — Z79899 Other long term (current) drug therapy: Secondary | ICD-10-CM | POA: Diagnosis not present

## 2023-04-11 DIAGNOSIS — M329 Systemic lupus erythematosus, unspecified: Secondary | ICD-10-CM | POA: Diagnosis not present

## 2023-04-15 DIAGNOSIS — G894 Chronic pain syndrome: Secondary | ICD-10-CM | POA: Diagnosis not present

## 2023-04-15 DIAGNOSIS — Z79899 Other long term (current) drug therapy: Secondary | ICD-10-CM | POA: Diagnosis not present

## 2023-04-15 DIAGNOSIS — G935 Compression of brain: Secondary | ICD-10-CM | POA: Diagnosis not present

## 2023-04-15 DIAGNOSIS — Z79891 Long term (current) use of opiate analgesic: Secondary | ICD-10-CM | POA: Diagnosis not present

## 2023-04-17 ENCOUNTER — Other Ambulatory Visit: Payer: Self-pay

## 2023-04-17 DIAGNOSIS — Z3041 Encounter for surveillance of contraceptive pills: Secondary | ICD-10-CM

## 2023-04-17 MED ORDER — NORETHINDRONE ACET-ETHINYL EST 1.5-30 MG-MCG PO TABS
1.0000 | ORAL_TABLET | Freq: Every day | ORAL | 3 refills | Status: DC
Start: 2023-04-17 — End: 2023-04-18

## 2023-04-18 ENCOUNTER — Telehealth: Payer: Self-pay

## 2023-04-18 ENCOUNTER — Other Ambulatory Visit: Payer: Self-pay

## 2023-04-18 DIAGNOSIS — Z3041 Encounter for surveillance of contraceptive pills: Secondary | ICD-10-CM

## 2023-04-18 MED ORDER — NORETHINDRONE ACET-ETHINYL EST 1.5-30 MG-MCG PO TABS
1.0000 | ORAL_TABLET | Freq: Every day | ORAL | 3 refills | Status: DC
Start: 2023-04-18 — End: 2023-06-19

## 2023-04-18 NOTE — Telephone Encounter (Signed)
PA was done and approved for Morgan Kent 1.5/30 1.5-30 MG. Patient notified as well.

## 2023-05-11 ENCOUNTER — Other Ambulatory Visit: Payer: Self-pay | Admitting: Physician Assistant

## 2023-05-11 DIAGNOSIS — D509 Iron deficiency anemia, unspecified: Secondary | ICD-10-CM

## 2023-05-15 ENCOUNTER — Other Ambulatory Visit: Payer: Self-pay | Admitting: Dermatology

## 2023-05-15 DIAGNOSIS — L700 Acne vulgaris: Secondary | ICD-10-CM

## 2023-05-27 DIAGNOSIS — G894 Chronic pain syndrome: Secondary | ICD-10-CM | POA: Diagnosis not present

## 2023-05-27 DIAGNOSIS — Z79891 Long term (current) use of opiate analgesic: Secondary | ICD-10-CM | POA: Diagnosis not present

## 2023-05-27 DIAGNOSIS — Z79899 Other long term (current) drug therapy: Secondary | ICD-10-CM | POA: Diagnosis not present

## 2023-06-11 ENCOUNTER — Telehealth: Payer: Self-pay | Admitting: Internal Medicine

## 2023-06-11 ENCOUNTER — Other Ambulatory Visit: Payer: Self-pay

## 2023-06-11 DIAGNOSIS — R0602 Shortness of breath: Secondary | ICD-10-CM

## 2023-06-11 NOTE — Telephone Encounter (Signed)
Left vm and sent mychart message to confirm 06/18/23 appointment-[name]

## 2023-06-12 DIAGNOSIS — D509 Iron deficiency anemia, unspecified: Secondary | ICD-10-CM | POA: Diagnosis not present

## 2023-06-12 DIAGNOSIS — E559 Vitamin D deficiency, unspecified: Secondary | ICD-10-CM | POA: Diagnosis not present

## 2023-06-12 DIAGNOSIS — E782 Mixed hyperlipidemia: Secondary | ICD-10-CM | POA: Diagnosis not present

## 2023-06-12 DIAGNOSIS — E538 Deficiency of other specified B group vitamins: Secondary | ICD-10-CM | POA: Diagnosis not present

## 2023-06-12 DIAGNOSIS — R946 Abnormal results of thyroid function studies: Secondary | ICD-10-CM | POA: Diagnosis not present

## 2023-06-18 ENCOUNTER — Ambulatory Visit (INDEPENDENT_AMBULATORY_CARE_PROVIDER_SITE_OTHER): Payer: Medicare Other | Admitting: Internal Medicine

## 2023-06-18 DIAGNOSIS — R0602 Shortness of breath: Secondary | ICD-10-CM | POA: Diagnosis not present

## 2023-06-18 LAB — PULMONARY FUNCTION TEST

## 2023-06-19 ENCOUNTER — Ambulatory Visit: Payer: Medicare Other | Admitting: Physician Assistant

## 2023-06-19 ENCOUNTER — Other Ambulatory Visit: Payer: Self-pay

## 2023-06-19 ENCOUNTER — Encounter: Payer: Self-pay | Admitting: Physician Assistant

## 2023-06-19 VITALS — BP 132/96 | HR 95 | Temp 98.4°F | Resp 16 | Ht 66.0 in | Wt 228.2 lb

## 2023-06-19 DIAGNOSIS — Z3041 Encounter for surveillance of contraceptive pills: Secondary | ICD-10-CM

## 2023-06-19 DIAGNOSIS — I10 Essential (primary) hypertension: Secondary | ICD-10-CM | POA: Diagnosis not present

## 2023-06-19 DIAGNOSIS — Z0001 Encounter for general adult medical examination with abnormal findings: Secondary | ICD-10-CM

## 2023-06-19 DIAGNOSIS — Z1231 Encounter for screening mammogram for malignant neoplasm of breast: Secondary | ICD-10-CM

## 2023-06-19 MED ORDER — NORETHINDRONE ACET-ETHINYL EST 1.5-30 MG-MCG PO TABS
1.0000 | ORAL_TABLET | Freq: Every day | ORAL | 1 refills | Status: DC
Start: 2023-06-19 — End: 2023-06-19

## 2023-06-19 MED ORDER — NORETHINDRONE ACET-ETHINYL EST 1.5-30 MG-MCG PO TABS
1.0000 | ORAL_TABLET | Freq: Every day | ORAL | 1 refills | Status: DC
Start: 2023-06-19 — End: 2023-09-15

## 2023-06-19 NOTE — Progress Notes (Signed)
[facility] [address] [place], [place] [address]  Internal MEDICINE  Office Visit Note  Patient Name: Morgan Kent  [id number]  [id number]  Date of Service: 06/24/2023  Chief Complaint  Patient presents with   Depression   Hypertension   Medicare Wellness    HPI Morgan Kent presents for an annual well visit and physical exam.  Well-appearing 42 y.o. female Routine CRC screening: not yet indicated Routine mammogram: due in October Pap smear: Followed by GYN now? Has not seen GYN in a few years, is due for Pap by our records. She is on cycle today, but is open to updating this next visit. Reports difficulty with pharmacy filling OCP still, will resend again Labs: ferritin a little elevated still, cholesterol elevated and will work on this, b12 elevated--will decrease supplement to every other day due to hx of being low Other concerns: Bp elevated still on recheck 130/96     06/19/2023   10:19 AM 06/13/2022   10:28 AM 06/07/2021    9:29 AM  MMSE - Mini Mental State Exam  Orientation to time 3 5 1   Orientation to Place 5 5 5   Registration 3 3 3   Attention/ Calculation 5 5 5   Recall 3 3 3   Language- name 2 objects 2 2 2   Language- repeat 1 1 0  Language- follow 3 step command 3 3 3   Language- read & follow direction 1 1 1   Write a sentence 1 1 1   Copy design 1 1 1   Total score 28 30 25     Functional Status Survey: Is the patient deaf or have difficulty hearing?: No Does the patient have difficulty seeing, even when wearing glasses/contacts?: No Does the patient have difficulty concentrating, remembering, or making decisions?: No Does the patient have difficulty walking or climbing stairs?: No Does the patient have difficulty dressing or bathing?: No Does the patient have difficulty doing errands alone such as visiting a doctor's office or shopping?: No     06/13/2022   10:27 AM 08/19/2022   11:55 AM 10/17/2022    9:45 AM 02/20/2023    9:41 AM 06/19/2023   10:17  AM  Fall Risk  Falls in the past year? 0  0 0 0  Was there an injury with Fall?     0  Fall Risk Category Calculator     0  (RETIRED) Patient Fall Risk Level  Low fall risk     Patient at Risk for Falls Due to     No Fall Risks  Fall risk Follow up     Falls evaluation completed       10/17/2022    9:45 AM  Depression screen PHQ 2/9  Decreased Interest 0  Down, Depressed, Hopeless 0  PHQ - 2 Score 0        No data to display            Current Medication: Outpatient Encounter Medications as of 06/19/2023  Medication Sig   albuterol (VENTOLIN HFA) 108 (90 Base) MCG/ACT inhaler Inhale 2 puffs into the lungs daily.   amLODipine (NORVASC) 5 MG tablet TAKE 1 TABLET(5 MG) BY MOUTH DAILY   Budeson-Glycopyrrol-Formoterol (BREZTRI AEROSPHERE) 160-9-4.8 MCG/ACT AERO INHALE 2 PUFFS BY MOUTH INTO THE LUNGS TWICE DAILY   carvedilol (COREG) 25 MG tablet TAKE 1 TABLET(25 MG) BY MOUTH TWICE DAILY   cetirizine (ZYRTEC) 10 MG tablet Take 1 tablet (10 mg total) by mouth daily.   clobetasol (TEMOVATE) 0.05 % external solution  Apply 1 application. topically 2 (two) times daily as needed. Apply to scalp   Cyanocobalamin (B-12 COMPLIANCE INJECTION) 1000 MCG/ML KIT Inject as directed.   cyclobenzaprine (FLEXERIL) 10 MG tablet Take 1 tablet (10 mg total) by mouth 3 (three) times daily as needed.   Dapsone 7.5 % GEL APPLY TOPICALLY TO THE AFFECTED AREA EVERY DAY   diclofenac Sodium (VOLTAREN) 1 % GEL APPLY 2 GRAMS TOPICALLY TO THE AFFECTED AREA FOUR TIMES DAILY   FEROSUL 325 (65 Fe) MG tablet TAKE 1 TABLET BY MOUTH DAILY WITH BREAKFAST   fluconazole (DIFLUCAN) 200 MG tablet Take 1 tablet (200 mg total) by mouth once a week. Take 1 po 1 time a week for 2 weeks prn yeast flares around the mouth   fluticasone (FLONASE) 50 MCG/ACT nasal spray USE 1 SPRAY IN EACH NOSTRIL AT BEDTIME   folic acid (FOLVITE) 1 MG tablet Take 1 mg by mouth daily.   gabapentin (NEURONTIN) 300 MG capsule TAKE 1 CAPSULE(300 MG)  BY MOUTH THREE TIMES DAILY   hydrochlorothiazide (HYDRODIURIL) 12.5 MG tablet Take 1 tablet (12.5 mg total) by mouth daily.   hydroxychloroquine (PLAQUENIL) 200 MG tablet Take 1 tablet (200 mg total) by mouth daily.   ketoconazole (NIZORAL) 2 % shampoo LATHER ON SCALP ONCE TO TWICE A WEEK, LEAVE ON 6 TO 8 MINUTES, THEN RINSE WELL   lisinopril (ZESTRIL) 5 MG tablet Take 1 tablet (5 mg total) by mouth daily.   meloxicam (MOBIC) 7.5 MG tablet TAKE 1 TABLET(7.5 MG) BY MOUTH TWICE DAILY   methotrexate (RHEUMATREX) 2.5 MG tablet Take 2.5 mg by mouth once a week. Caution:Chemotherapy. Protect from light.   mometasone (ELOCON) 0.1 % lotion Apply topically as directed. Qd up to 5 days a week to affected area of scalp as needed for flares   mometasone (ELOCON) 0.1 % ointment APPLY TOPICALLY TO SORES ON FACE DAILY up to 5 days a week AS NEEDED FOR FLARES   montelukast (SINGULAIR) 10 MG tablet TAKE 1 TABLET(10 MG) BY MOUTH AT BEDTIME   mupirocin ointment (BACTROBAN) 2 % Apply 1 Application topically daily. Qd to open sores face, body prn flares   nitrofurantoin, macrocrystal-monohydrate, (MACROBID) 100 MG capsule Take 100 mg by mouth 2 (two) times daily.   NON FORMULARY CPAP with AHP   oxyCODONE-acetaminophen (PERCOCET) 7.5-325 MG tablet Take 1 tablet by mouth every 4 (four) hours as needed for severe pain.   Polyethyl Glycol-Propyl Glycol (SYSTANE ULTRA) 0.4-0.3 % SOLN Place 1 drop into both eyes daily as needed (for dry eyes).    tacrolimus (PROTOPIC) 0.1 % ointment TWICE DAILY TO CORNERS AS MUCH AS NEEDED   triamcinolone cream (KENALOG) 0.1 % Apply 1 Application topically as directed. Qd up to 5 days per week to aa lupus on arms until clear, avoid face, groin, axilla   Vitamin D, Ergocalciferol, (DRISDOL) 1.25 MG (50000 UNIT) CAPS capsule TAKE 1 CAPSULE BY MOUTH EVERY WEEK   [DISCONTINUED] Norethindrone Acetate-Ethinyl Estradiol (JUNEL 1.5/30) 1.5-30 MG-MCG tablet Take 1 tablet by mouth daily.    [DISCONTINUED] Norethindrone Acetate-Ethinyl Estradiol (JUNEL 1.5/30) 1.5-30 MG-MCG tablet Take 1 tablet by mouth daily.   No facility-administered encounter medications on file as of 06/19/2023.    Surgical History: Past Surgical History:  Procedure Laterality Date   carpel tunnel  Left 2014   CRANIOTOMY  14,06   chiari   DENTAL SURGERY     HERNIA REPAIR     umbilical   PLACEMENT OF LUMBAR DRAIN N/A 07/07/2014   Procedure:  PLACEMENT OF LUMBAR DRAIN AND CLOSURE OF CERVICAL INCISION;  Surgeon: Tressie Stalker, MD;  Location: [facility];  Service: Neurosurgery;  Laterality: N/A;   SUBOCCIPITAL CRANIECTOMY CERVICAL LAMINECTOMY N/A 09/29/2013   Procedure: SUBOCCIPITAL CRANIECTOMY CERVICAL LAMINECTOMY/DURAPLASTY;  Surgeon: Cristi Loron, MD;  Location: [facility];  Service: Neurosurgery;  Laterality: N/A;  posterior   SUBOCCIPITAL CRANIECTOMY CERVICAL LAMINECTOMY N/A 06/23/2014   Procedure: SUBOCCIPITAL CRANIECTOMY CERVICAL LAMINECTOMY/DURAPLASTY;  Surgeon: Tressie Stalker, MD;  Location: [facility];  Service: Neurosurgery;  Laterality: N/A;  suboccipital craniectomy with cervical laminectomy and duraplasty    Medical History: Past Medical History:  Diagnosis Date   Anxiety    Arthritis    rheumo possible   Chiari malformation    Depression    Dizziness    Headache(784.0)    History of kidney stones    Hypertension    Lupus (HCC)    Lupus (HCC)    Nerve damage    NECK   PONV (postoperative nausea and vomiting)    nausea only   Sleep apnea    TMJ arthritis     Family History: Family History  Problem Relation Age of Onset   Heart disease Father    Hypertension Father    Hypertension Mother    Thyroid disease Mother    Asthma Other    Depression Other    Diabetes Other    Migraines Other    Stroke Other     Social History   Socioeconomic History   Marital status: Single    Spouse name: Not on file   Number of children: Not on file   Years of education:  Not on file   Highest education level: Not on file  Occupational History   Not on file  Tobacco Use   Smoking status: Never   Smokeless tobacco: Never  Vaping Use   Vaping status: Never Used  Substance and Sexual Activity   Alcohol use: No    Alcohol/week: 0.0 standard drinks of alcohol   Drug use: No   Sexual activity: Not on file  Other Topics Concern   Not on file  Social History Narrative   Lives with 3 children in 2 story home.  Unemployed.  Trying to get disability.  Education: high school.   Social Determinants of Health   Financial Resource Strain: Not on file  Food Insecurity: Not on file  Transportation Needs: Not on file  Physical Activity: Not on file  Stress: Not on file  Social Connections: Not on file  Intimate Partner Violence: Not on file      Review of Systems  Constitutional:  Negative for chills and unexpected weight change.  HENT:  Negative for congestion, rhinorrhea, sneezing and sore throat.   Eyes:  Negative for redness.  Respiratory:  Negative for cough, chest tightness and shortness of breath.   Cardiovascular:  Negative for chest pain and palpitations.  Gastrointestinal:  Negative for abdominal pain, constipation, diarrhea, nausea and vomiting.  Genitourinary:  Negative for dysuria and frequency.  Musculoskeletal:  Positive for arthralgias. Negative for back pain, joint swelling and neck pain.  Skin:  Negative for rash.  Neurological: Negative.  Negative for tremors and numbness.  Hematological:  Negative for adenopathy. Does not bruise/bleed easily.  Psychiatric/Behavioral:  Negative for behavioral problems (Depression), sleep disturbance and suicidal ideas. The patient is not nervous/anxious.     Vital Signs: BP (!) 132/96   Pulse 95   Temp 98.4 F (36.9 C)   Resp  16   Ht 5\' 6"  (1.676 m)   Wt 228 lb 3.2 oz (103.5 kg)   SpO2 98%   BMI 36.83 kg/m    Physical Exam Vitals and nursing note reviewed.  Constitutional:      General:  She is not in acute distress.    Appearance: Normal appearance. She is obese. She is not ill-appearing.  HENT:     Head: Normocephalic and atraumatic.  Eyes:     Pupils: Pupils are equal, round, and reactive to light.  Cardiovascular:     Rate and Rhythm: Normal rate and regular rhythm.     Heart sounds: Normal heart sounds. No murmur heard. Pulmonary:     Effort: Pulmonary effort is normal. No respiratory distress.     Breath sounds: Normal breath sounds. No wheezing.  Abdominal:     General: Abdomen is flat.     Palpations: Abdomen is soft.     Tenderness: There is no abdominal tenderness.  Musculoskeletal:        General: Normal range of motion.  Skin:    General: Skin is warm and dry.  Neurological:     Mental Status: She is alert and oriented to person, place, and time.  Psychiatric:        Mood and Affect: Mood normal.        Behavior: Behavior normal.        Assessment/Plan: 1. Encounter for general adult medical examination with abnormal findings CPE performed, labs reviewed, due for PAP  2. Essential hypertension Elevated in office, will monitor  3. Visit for screening mammogram - MM 3D SCREENING MAMMOGRAM BILATERAL BREAST; Future  4. Encounter for surveillance of contraceptive pills Reordered    General Counseling: Kathia verbalizes understanding of the findings of todays visit and agrees with plan of treatment. I have discussed any further diagnostic evaluation that may be needed or ordered today. We also reviewed her medications today. she has been encouraged to call the office with any questions or concerns that should arise related to todays visit.    Orders Placed This Encounter  Procedures   MM 3D SCREENING MAMMOGRAM BILATERAL BREAST    Meds ordered this encounter  Medications   DISCONTD: Norethindrone Acetate-Ethinyl Estradiol (JUNEL 1.5/30) 1.5-30 MG-MCG tablet    Sig: Take 1 tablet by mouth daily.    Dispense:  84 tablet    Refill:  1     Return in about 3 weeks (around 07/10/2023) for HTN, pap only.   Total time spent:35 Minutes Time spent includes review of chart, medications, test results, and follow up plan with the patient.   [place] Controlled Substance Database was reviewed by me.  This patient was seen by Lynn Ito, PA-C in collaboration with Dr. Beverely Risen as a part of collaborative care agreement.  Lynn Ito, PA-C Internal medicine

## 2023-06-24 DIAGNOSIS — Z79891 Long term (current) use of opiate analgesic: Secondary | ICD-10-CM | POA: Diagnosis not present

## 2023-06-24 DIAGNOSIS — G894 Chronic pain syndrome: Secondary | ICD-10-CM | POA: Diagnosis not present

## 2023-06-25 ENCOUNTER — Other Ambulatory Visit: Payer: Self-pay | Admitting: Cardiovascular Disease

## 2023-06-26 NOTE — Procedures (Signed)
[facility] [address] [place], [address]    Complete Pulmonary Function Testing Interpretation:  FINDINGS:  The forced vital capacity is mildly decreased.  FEV1 is 1.67 L which is 62% of predicted and is moderately decreased.  FEV1 FVC ratio is mildly decreased.  Postbronchodilator there was no significant change in the FEV1 clinical improvement may occur in the absence of spirometric improvement.  Total lung capacity is mildly decreased.  Residual volume is normal.  Residual volume total lung capacity ratio is increased.  FRC is normal.  The DLCO was normal.  IMPRESSION:  This pulmonary function study is suggestive of moderate obstructive lung disease and mild restrictive lung disease.  Clinical correlation is recommended  Yevonne Pax, MD [facility] Pulmonary Critical Care Medicine Sleep Medicine

## 2023-07-02 ENCOUNTER — Ambulatory Visit: Payer: Medicare Other

## 2023-07-10 ENCOUNTER — Ambulatory Visit: Payer: Medicare Other | Admitting: Physician Assistant

## 2023-07-18 DIAGNOSIS — Z79899 Other long term (current) drug therapy: Secondary | ICD-10-CM | POA: Diagnosis not present

## 2023-07-18 DIAGNOSIS — L930 Discoid lupus erythematosus: Secondary | ICD-10-CM | POA: Diagnosis not present

## 2023-07-18 DIAGNOSIS — I7300 Raynaud's syndrome without gangrene: Secondary | ICD-10-CM | POA: Diagnosis not present

## 2023-07-18 DIAGNOSIS — M329 Systemic lupus erythematosus, unspecified: Secondary | ICD-10-CM | POA: Diagnosis not present

## 2023-07-24 DIAGNOSIS — Z79891 Long term (current) use of opiate analgesic: Secondary | ICD-10-CM | POA: Diagnosis not present

## 2023-07-24 DIAGNOSIS — G894 Chronic pain syndrome: Secondary | ICD-10-CM | POA: Diagnosis not present

## 2023-07-25 ENCOUNTER — Other Ambulatory Visit: Payer: Self-pay | Admitting: Dermatology

## 2023-07-25 DIAGNOSIS — L700 Acne vulgaris: Secondary | ICD-10-CM

## 2023-07-27 ENCOUNTER — Other Ambulatory Visit: Payer: Self-pay | Admitting: Cardiovascular Disease

## 2023-07-30 ENCOUNTER — Ambulatory Visit (INDEPENDENT_AMBULATORY_CARE_PROVIDER_SITE_OTHER): Payer: Medicare Other

## 2023-07-30 DIAGNOSIS — G4733 Obstructive sleep apnea (adult) (pediatric): Secondary | ICD-10-CM

## 2023-07-30 NOTE — Progress Notes (Signed)
95 percentile pressure 5.3   95th percentile leak 12.3   apnea index 0.1 /hr  apnea-hypopnea index  0.3 /hr   total days used  >4 hr 62 days  total days used <4 hr 28 days  Total compliance 70 percent  She is doing pretty good. She has been sick but doing better.     Pt was seen by Tresa Endo  RRT/RCP  from [facility]

## 2023-08-04 ENCOUNTER — Ambulatory Visit (INDEPENDENT_AMBULATORY_CARE_PROVIDER_SITE_OTHER): Payer: Medicare Other | Admitting: Nurse Practitioner

## 2023-08-04 ENCOUNTER — Encounter: Payer: Self-pay | Admitting: Nurse Practitioner

## 2023-08-04 VITALS — BP 138/85 | HR 92 | Temp 97.6°F | Resp 16 | Ht 66.0 in | Wt 231.4 lb

## 2023-08-04 DIAGNOSIS — J0111 Acute recurrent frontal sinusitis: Secondary | ICD-10-CM

## 2023-08-04 DIAGNOSIS — B370 Candidal stomatitis: Secondary | ICD-10-CM | POA: Diagnosis not present

## 2023-08-04 MED ORDER — PREDNISONE 10 MG (21) PO TBPK
ORAL_TABLET | ORAL | 0 refills | Status: DC
Start: 2023-08-04 — End: 2023-12-31

## 2023-08-04 MED ORDER — AMOXICILLIN-POT CLAVULANATE 875-125 MG PO TABS: 1.0000 | ORAL_TABLET | Freq: Two times a day (BID) | ORAL | 0 refills | Status: DC

## 2023-08-04 MED ORDER — FLUCONAZOLE 150 MG PO TABS
150.0000 mg | ORAL_TABLET | Freq: Once | ORAL | 0 refills | Status: AC
Start: 2023-08-04 — End: 2023-08-04

## 2023-08-04 NOTE — Progress Notes (Signed)
[facility] [address] [place], [place] [address]  Internal MEDICINE  Office Visit Note  Patient Name: Morgan Kent  [id number]  [id number]  Date of Service: 08/04/2023  Chief Complaint  Patient presents with   Follow-up     HPI Morgan Kent presents for an acute sick visit for possible URI Symptoms started about 6-7 days ago Morgan Kent to lupus doctor and was given some steroids to help with a flare up which kept her symptoms from getting worse  Reports sore throat, congestion, ear pain, runny nose, sinus pressure, headache, and fatigue Voice is hoarse.     Current Medication:  Outpatient Encounter Medications as of 08/04/2023  Medication Sig   albuterol (VENTOLIN HFA) 108 (90 Base) MCG/ACT inhaler Inhale 2 puffs into the lungs daily.   amLODipine (NORVASC) 5 MG tablet TAKE 1 TABLET(5 MG) BY MOUTH DAILY   amoxicillin-clavulanate (AUGMENTIN) 875-125 MG tablet Take 1 tablet by mouth 2 (two) times daily.   Budeson-Glycopyrrol-Formoterol (BREZTRI AEROSPHERE) 160-9-4.8 MCG/ACT AERO INHALE 2 PUFFS BY MOUTH INTO THE LUNGS TWICE DAILY   carvedilol (COREG) 25 MG tablet TAKE 1 TABLET(25 MG) BY MOUTH TWICE DAILY/PLEASE CALL OFFICE TO SCHEDULE APPOINTMENT PRIOR TO NEXT REFILL   cetirizine (ZYRTEC) 10 MG tablet Take 1 tablet (10 mg total) by mouth daily.   clobetasol (TEMOVATE) 0.05 % external solution Apply 1 application. topically 2 (two) times daily as needed. Apply to scalp   Cyanocobalamin (B-12 COMPLIANCE INJECTION) 1000 MCG/ML KIT Inject as directed.   cyclobenzaprine (FLEXERIL) 10 MG tablet Take 1 tablet (10 mg total) by mouth 3 (three) times daily as needed.   Dapsone 7.5 % GEL APPLY TOPICALLY TO THE AFFECTED AREA DAILY   diclofenac Sodium (VOLTAREN) 1 % GEL APPLY 2 GRAMS TOPICALLY TO THE AFFECTED AREA FOUR TIMES DAILY   FEROSUL 325 (65 Fe) MG tablet TAKE 1 TABLET BY MOUTH DAILY WITH BREAKFAST   fluconazole (DIFLUCAN) 150 MG tablet Take 1 tablet (150 mg total) by mouth once  for 1 dose. May take an additional dose after 3 days if still symptomatic.   fluconazole (DIFLUCAN) 200 MG tablet Take 1 tablet (200 mg total) by mouth once a week. Take 1 po 1 time a week for 2 weeks prn yeast flares around the mouth   fluticasone (FLONASE) 50 MCG/ACT nasal spray USE 1 SPRAY IN EACH NOSTRIL AT BEDTIME   folic acid (FOLVITE) 1 MG tablet Take 1 mg by mouth daily.   gabapentin (NEURONTIN) 300 MG capsule TAKE 1 CAPSULE(300 MG) BY MOUTH THREE TIMES DAILY   hydrochlorothiazide (HYDRODIURIL) 12.5 MG tablet TAKE 1 TABLET(12.5 MG) BY MOUTH DAILY   hydroxychloroquine (PLAQUENIL) 200 MG tablet Take 1 tablet (200 mg total) by mouth daily.   ketoconazole (NIZORAL) 2 % shampoo LATHER ON SCALP ONCE TO TWICE A WEEK, LEAVE ON 6 TO 8 MINUTES, THEN RINSE WELL   lisinopril (ZESTRIL) 5 MG tablet Take 1 tablet (5 mg total) by mouth daily.   meloxicam (MOBIC) 7.5 MG tablet TAKE 1 TABLET(7.5 MG) BY MOUTH TWICE DAILY   methotrexate (RHEUMATREX) 2.5 MG tablet Take 2.5 mg by mouth once a week. Caution:Chemotherapy. Protect from light.   mometasone (ELOCON) 0.1 % lotion Apply topically as directed. Qd up to 5 days a week to affected area of scalp as needed for flares   mometasone (ELOCON) 0.1 % ointment APPLY TOPICALLY TO SORES ON FACE DAILY up to 5 days a week AS NEEDED FOR FLARES   montelukast (SINGULAIR) 10 MG tablet TAKE 1  TABLET(10 MG) BY MOUTH AT BEDTIME   mupirocin ointment (BACTROBAN) 2 % Apply 1 Application topically daily. Qd to open sores face, body prn flares   nitrofurantoin, macrocrystal-monohydrate, (MACROBID) 100 MG capsule Take 100 mg by mouth 2 (two) times daily.   NON FORMULARY CPAP with AHP   Norethindrone Acetate-Ethinyl Estradiol (JUNEL 1.5/30) 1.5-30 MG-MCG tablet Take 1 tablet by mouth daily.   oxyCODONE-acetaminophen (PERCOCET) 7.5-325 MG tablet Take 1 tablet by mouth every 4 (four) hours as needed for severe pain.   Polyethyl Glycol-Propyl Glycol (SYSTANE ULTRA) 0.4-0.3 % SOLN  Place 1 drop into both eyes daily as needed (for dry eyes).    predniSONE (STERAPRED UNI-PAK 21 TAB) 10 MG (21) TBPK tablet Use as directed for 6 days   tacrolimus (PROTOPIC) 0.1 % ointment TWICE DAILY TO CORNERS AS MUCH AS NEEDED   triamcinolone cream (KENALOG) 0.1 % Apply 1 Application topically as directed. Qd up to 5 days per week to aa lupus on arms until clear, avoid face, groin, axilla   Vitamin D, Ergocalciferol, (DRISDOL) 1.25 MG (50000 UNIT) CAPS capsule TAKE 1 CAPSULE BY MOUTH EVERY WEEK   No facility-administered encounter medications on file as of 08/04/2023.      Medical History: Past Medical History:  Diagnosis Date   Anxiety    Arthritis    rheumo possible   Chiari malformation    Depression    Dizziness    Headache(784.0)    History of kidney stones    Hypertension    Lupus (HCC)    Lupus (HCC)    Nerve damage    NECK   PONV (postoperative nausea and vomiting)    nausea only   Sleep apnea    TMJ arthritis      Vital Signs: BP 138/85 Comment: 146/88  Pulse 92   Temp 97.6 F (36.4 C)   Resp 16   Ht 5\' 6"  (1.676 m)   Wt 231 lb 6.4 oz (105 kg)   SpO2 97%   BMI 37.35 kg/m    Review of Systems  Constitutional:  Positive for fatigue. Negative for appetite change, chills and fever.  HENT:  Positive for congestion, ear pain, postnasal drip, rhinorrhea, sinus pressure, sinus pain, sore throat and voice change. Negative for sneezing and trouble swallowing.   Respiratory: Negative.  Negative for cough, chest tightness, shortness of breath and wheezing.   Cardiovascular: Negative.  Negative for chest pain and palpitations.  Neurological:  Positive for headaches.    Physical Exam Constitutional:      General: She is not in acute distress.    Appearance: Normal appearance. She is obese. She is ill-appearing.  HENT:     Head: Normocephalic and atraumatic.     Right Ear: Tympanic membrane, ear canal and external ear normal.     Left Ear: Tympanic membrane,  ear canal and external ear normal.     Nose: Mucosal edema, congestion and rhinorrhea present.     Right Turbinates: Swollen (erythematous).     Left Turbinates: Swollen (erythematous).     Right Sinus: Maxillary sinus tenderness and frontal sinus tenderness present.     Left Sinus: Maxillary sinus tenderness and frontal sinus tenderness present.     Mouth/Throat:     Lips: Pink.     Mouth: Mucous membranes are moist.     Pharynx: Pharyngeal swelling and posterior oropharyngeal erythema present.     Tonsils: 2+ on the right. 2+ on the left.  Eyes:     Pupils:  Pupils are equal, round, and reactive to light.  Cardiovascular:     Rate and Rhythm: Normal rate and regular rhythm.     Heart sounds: Normal heart sounds. No murmur heard. Pulmonary:     Effort: Pulmonary effort is normal. No respiratory distress.     Breath sounds: Normal breath sounds.  Neurological:     Mental Status: She is alert and oriented to person, place, and time.  Psychiatric:        Mood and Affect: Mood normal.        Behavior: Behavior normal.       Assessment/Plan: 1. Acute recurrent frontal sinusitis Augmentin and prednisone prescribed. Take until gone  - amoxicillin-clavulanate (AUGMENTIN) 875-125 MG tablet; Take 1 tablet by mouth 2 (two) times daily.  Dispense: 20 tablet; Refill: 0 - predniSONE (STERAPRED UNI-PAK 21 TAB) 10 MG (21) TBPK tablet; Use as directed for 6 days  Dispense: 21 tablet; Refill: 0  2. Oral thrush Fluconazole prescribed  - fluconazole (DIFLUCAN) 150 MG tablet; Take 1 tablet (150 mg total) by mouth once for 1 dose. May take an additional dose after 3 days if still symptomatic.  Dispense: 3 tablet; Refill: 0   General Counseling: Chetara verbalizes understanding of the findings of todays visit and agrees with plan of treatment. I have discussed any further diagnostic evaluation that may be needed or ordered today. We also reviewed her medications today. she has been encouraged to call  the office with any questions or concerns that should arise related to todays visit.    Counseling:    No orders of the defined types were placed in this encounter.   Meds ordered this encounter  Medications   amoxicillin-clavulanate (AUGMENTIN) 875-125 MG tablet    Sig: Take 1 tablet by mouth 2 (two) times daily.    Dispense:  20 tablet    Refill:  0   predniSONE (STERAPRED UNI-PAK 21 TAB) 10 MG (21) TBPK tablet    Sig: Use as directed for 6 days    Dispense:  21 tablet    Refill:  0   fluconazole (DIFLUCAN) 150 MG tablet    Sig: Take 1 tablet (150 mg total) by mouth once for 1 dose. May take an additional dose after 3 days if still symptomatic.    Dispense:  3 tablet    Refill:  0    Return for reschedule pulm/sleep follow up for 3 weeks from now please. .  [place] Controlled Substance Database was reviewed by me for overdose risk score (ORS)  Time spent:20 Minutes Time spent with patient included reviewing progress notes, labs, imaging studies, and discussing plan for follow up.   This patient was seen by Sallyanne Kuster, FNP-C in collaboration with Dr. Beverely Risen as a part of collaborative care agreement.  Javonnie Illescas R. Tedd Sias, MSN, FNP-C Internal Medicine

## 2023-08-12 DIAGNOSIS — I10 Essential (primary) hypertension: Secondary | ICD-10-CM | POA: Diagnosis not present

## 2023-08-12 DIAGNOSIS — M329 Systemic lupus erythematosus, unspecified: Secondary | ICD-10-CM | POA: Diagnosis not present

## 2023-08-12 DIAGNOSIS — N179 Acute kidney failure, unspecified: Secondary | ICD-10-CM | POA: Diagnosis not present

## 2023-08-12 DIAGNOSIS — R808 Other proteinuria: Secondary | ICD-10-CM | POA: Diagnosis not present

## 2023-08-13 DIAGNOSIS — Z87442 Personal history of urinary calculi: Secondary | ICD-10-CM | POA: Diagnosis not present

## 2023-08-13 DIAGNOSIS — M329 Systemic lupus erythematosus, unspecified: Secondary | ICD-10-CM | POA: Diagnosis not present

## 2023-08-13 DIAGNOSIS — N182 Chronic kidney disease, stage 2 (mild): Secondary | ICD-10-CM | POA: Diagnosis not present

## 2023-08-13 DIAGNOSIS — N2581 Secondary hyperparathyroidism of renal origin: Secondary | ICD-10-CM | POA: Diagnosis not present

## 2023-08-13 DIAGNOSIS — I10 Essential (primary) hypertension: Secondary | ICD-10-CM | POA: Diagnosis not present

## 2023-08-22 ENCOUNTER — Ambulatory Visit: Payer: Medicare Other | Admitting: Physician Assistant

## 2023-08-22 DIAGNOSIS — G894 Chronic pain syndrome: Secondary | ICD-10-CM | POA: Diagnosis not present

## 2023-08-22 DIAGNOSIS — Z79891 Long term (current) use of opiate analgesic: Secondary | ICD-10-CM | POA: Diagnosis not present

## 2023-08-25 ENCOUNTER — Ambulatory Visit (INDEPENDENT_AMBULATORY_CARE_PROVIDER_SITE_OTHER): Payer: Medicare Other | Admitting: Nurse Practitioner

## 2023-08-25 ENCOUNTER — Encounter: Payer: Self-pay | Admitting: Nurse Practitioner

## 2023-08-25 VITALS — BP 122/89 | HR 52 | Temp 97.9°F | Resp 16 | Ht 66.0 in | Wt 230.4 lb

## 2023-08-25 DIAGNOSIS — Z7189 Other specified counseling: Secondary | ICD-10-CM

## 2023-08-25 DIAGNOSIS — J4540 Moderate persistent asthma, uncomplicated: Secondary | ICD-10-CM | POA: Diagnosis not present

## 2023-08-25 DIAGNOSIS — R0789 Other chest pain: Secondary | ICD-10-CM | POA: Diagnosis not present

## 2023-08-25 DIAGNOSIS — G4733 Obstructive sleep apnea (adult) (pediatric): Secondary | ICD-10-CM | POA: Diagnosis not present

## 2023-08-25 NOTE — Progress Notes (Unsigned)
[facility] [address] [place], [place] [address]  Internal MEDICINE  Office Visit Note  Patient Name: Morgan Kent  [id number]  [id number]  Date of Service: 08/25/2023  Chief Complaint  Patient presents with   Follow-up    HPI Morgan Kent presents for a follow-up visit for pulmonary/sleep Asthma and COPD -- PFT has changed since last year, now has moderate obstrcutive lung disease AND mild restrictive lung disease. OSA -- using every night -- difficult to use when sick but otherwise no issues Chest pain -- having intermittent chest pain over the sternum and to the right of the sternum, started yesterday. Not affected by breathing or moving or walking. Patient unable to describe what the pain feels like. Severity of pain rated 8/10 when she has it. No other symptoms noted when she has the pain and no radiation of the pain to any other areas. EKG to rule out MI.     Current Medication: Outpatient Encounter Medications as of 08/25/2023  Medication Sig   albuterol (VENTOLIN HFA) 108 (90 Base) MCG/ACT inhaler Inhale 2 puffs into the lungs daily.   amLODipine (NORVASC) 5 MG tablet TAKE 1 TABLET(5 MG) BY MOUTH DAILY   amoxicillin-clavulanate (AUGMENTIN) 875-125 MG tablet Take 1 tablet by mouth 2 (two) times daily.   Budeson-Glycopyrrol-Formoterol (BREZTRI AEROSPHERE) 160-9-4.8 MCG/ACT AERO INHALE 2 PUFFS BY MOUTH INTO THE LUNGS TWICE DAILY   carvedilol (COREG) 25 MG tablet TAKE 1 TABLET(25 MG) BY MOUTH TWICE DAILY/PLEASE CALL OFFICE TO SCHEDULE APPOINTMENT PRIOR TO NEXT REFILL   cetirizine (ZYRTEC) 10 MG tablet Take 1 tablet (10 mg total) by mouth daily.   clobetasol (TEMOVATE) 0.05 % external solution Apply 1 application. topically 2 (two) times daily as needed. Apply to scalp   Cyanocobalamin (B-12 COMPLIANCE INJECTION) 1000 MCG/ML KIT Inject as directed.   cyclobenzaprine (FLEXERIL) 10 MG tablet Take 1 tablet (10 mg total) by mouth 3 (three) times daily as needed.    Dapsone 7.5 % GEL APPLY TOPICALLY TO THE AFFECTED AREA DAILY   diclofenac Sodium (VOLTAREN) 1 % GEL APPLY 2 GRAMS TOPICALLY TO THE AFFECTED AREA FOUR TIMES DAILY   FEROSUL 325 (65 Fe) MG tablet TAKE 1 TABLET BY MOUTH DAILY WITH BREAKFAST   fluconazole (DIFLUCAN) 200 MG tablet Take 1 tablet (200 mg total) by mouth once a week. Take 1 po 1 time a week for 2 weeks prn yeast flares around the mouth   fluticasone (FLONASE) 50 MCG/ACT nasal spray USE 1 SPRAY IN EACH NOSTRIL AT BEDTIME   folic acid (FOLVITE) 1 MG tablet Take 1 mg by mouth daily.   gabapentin (NEURONTIN) 300 MG capsule TAKE 1 CAPSULE(300 MG) BY MOUTH THREE TIMES DAILY   hydrochlorothiazide (HYDRODIURIL) 12.5 MG tablet TAKE 1 TABLET(12.5 MG) BY MOUTH DAILY   hydroxychloroquine (PLAQUENIL) 200 MG tablet Take 1 tablet (200 mg total) by mouth daily.   ketoconazole (NIZORAL) 2 % shampoo LATHER ON SCALP ONCE TO TWICE A WEEK, LEAVE ON 6 TO 8 MINUTES, THEN RINSE WELL   lisinopril (ZESTRIL) 5 MG tablet Take 1 tablet (5 mg total) by mouth daily.   meloxicam (MOBIC) 7.5 MG tablet TAKE 1 TABLET(7.5 MG) BY MOUTH TWICE DAILY   methotrexate (RHEUMATREX) 2.5 MG tablet Take 2.5 mg by mouth once a week. Caution:Chemotherapy. Protect from light.   mometasone (ELOCON) 0.1 % lotion Apply topically as directed. Qd up to 5 days a week to affected area of scalp as needed for flares   mometasone (ELOCON) 0.1 % ointment  APPLY TOPICALLY TO SORES ON FACE DAILY up to 5 days a week AS NEEDED FOR FLARES   montelukast (SINGULAIR) 10 MG tablet TAKE 1 TABLET(10 MG) BY MOUTH AT BEDTIME   mupirocin ointment (BACTROBAN) 2 % Apply 1 Application topically daily. Qd to open sores face, body prn flares   nitrofurantoin, macrocrystal-monohydrate, (MACROBID) 100 MG capsule Take 100 mg by mouth 2 (two) times daily.   NON FORMULARY CPAP with AHP   Norethindrone Acetate-Ethinyl Estradiol (JUNEL 1.5/30) 1.5-30 MG-MCG tablet Take 1 tablet by mouth daily.   oxyCODONE-acetaminophen  (PERCOCET) 7.5-325 MG tablet Take 1 tablet by mouth every 4 (four) hours as needed for severe pain.   Polyethyl Glycol-Propyl Glycol (SYSTANE ULTRA) 0.4-0.3 % SOLN Place 1 drop into both eyes daily as needed (for dry eyes).    predniSONE (STERAPRED UNI-PAK 21 TAB) 10 MG (21) TBPK tablet Use as directed for 6 days   tacrolimus (PROTOPIC) 0.1 % ointment TWICE DAILY TO CORNERS AS MUCH AS NEEDED   triamcinolone cream (KENALOG) 0.1 % Apply 1 Application topically as directed. Qd up to 5 days per week to aa lupus on arms until clear, avoid face, groin, axilla   Vitamin D, Ergocalciferol, (DRISDOL) 1.25 MG (50000 UNIT) CAPS capsule TAKE 1 CAPSULE BY MOUTH EVERY WEEK   No facility-administered encounter medications on file as of 08/25/2023.    Surgical History: Past Surgical History:  Procedure Laterality Date   carpel tunnel  Left 2014   CRANIOTOMY  14,06   chiari   DENTAL SURGERY     HERNIA REPAIR     umbilical   PLACEMENT OF LUMBAR DRAIN N/A 07/07/2014   Procedure: PLACEMENT OF LUMBAR DRAIN AND CLOSURE OF CERVICAL INCISION;  Surgeon: Tressie Stalker, MD;  Location: [facility];  Service: Neurosurgery;  Laterality: N/A;   SUBOCCIPITAL CRANIECTOMY CERVICAL LAMINECTOMY N/A 09/29/2013   Procedure: SUBOCCIPITAL CRANIECTOMY CERVICAL LAMINECTOMY/DURAPLASTY;  Surgeon: Cristi Loron, MD;  Location: [facility];  Service: Neurosurgery;  Laterality: N/A;  posterior   SUBOCCIPITAL CRANIECTOMY CERVICAL LAMINECTOMY N/A 06/23/2014   Procedure: SUBOCCIPITAL CRANIECTOMY CERVICAL LAMINECTOMY/DURAPLASTY;  Surgeon: Tressie Stalker, MD;  Location: [facility];  Service: Neurosurgery;  Laterality: N/A;  suboccipital craniectomy with cervical laminectomy and duraplasty    Medical History: Past Medical History:  Diagnosis Date   Anxiety    Arthritis    rheumo possible   Chiari malformation    Depression    Dizziness    Headache(784.0)    History of kidney stones    Hypertension    Lupus    Lupus     Nerve damage    NECK   PONV (postoperative nausea and vomiting)    nausea only   Sleep apnea    TMJ arthritis     Family History: Family History  Problem Relation Age of Onset   Heart disease Father    Hypertension Father    Hypertension Mother    Thyroid disease Mother    Asthma Other    Depression Other    Diabetes Other    Migraines Other    Stroke Other     Social History   Socioeconomic History   Marital status: Single    Spouse name: Not on file   Number of children: Not on file   Years of education: Not on file   Highest education level: Not on file  Occupational History   Not on file  Tobacco Use   Smoking status: Never   Smokeless tobacco: Never  Vaping Use   Vaping status: Never Used  Substance and Sexual Activity   Alcohol use: No    Alcohol/week: 0.0 standard drinks of alcohol   Drug use: No   Sexual activity: Not on file  Other Topics Concern   Not on file  Social History Narrative   Lives with 3 children in 2 story home.  Unemployed.  Trying to get disability.  Education: high school.   Social Determinants of Health   Financial Resource Strain: Not on file  Food Insecurity: Not on file  Transportation Needs: Not on file  Physical Activity: Not on file  Stress: Not on file  Social Connections: Not on file  Intimate Partner Violence: Not on file      Review of Systems  Constitutional:  Negative for chills, fatigue and unexpected weight change.  HENT:  Positive for postnasal drip. Negative for congestion, rhinorrhea, sneezing and sore throat.   Respiratory: Negative.  Negative for cough, chest tightness, shortness of breath and wheezing.   Cardiovascular:  Positive for chest pain. Negative for palpitations.  Gastrointestinal:  Negative for abdominal pain, constipation, diarrhea, nausea and vomiting.  Genitourinary:  Negative for dysuria and frequency.  Musculoskeletal:  Negative for arthralgias, back pain, joint swelling and neck pain.   Skin:  Negative for rash.  Neurological: Negative.   Psychiatric/Behavioral:  Negative for behavioral problems (Depression), sleep disturbance and suicidal ideas. The patient is not nervous/anxious.     Vital Signs: BP 122/89   Pulse (!) 52   Temp 97.9 F (36.6 C)   Resp 16   Ht 5\' 6"  (1.676 m)   Wt 230 lb 6.4 oz (104.5 kg)   SpO2 100%   BMI 37.19 kg/m    Physical Exam Vitals reviewed.  Constitutional:      General: She is not in acute distress.    Appearance: Normal appearance. She is obese. She is not ill-appearing.  HENT:     Head: Normocephalic and atraumatic.  Eyes:     Pupils: Pupils are equal, round, and reactive to light.  Cardiovascular:     Rate and Rhythm: Normal rate and regular rhythm.     Heart sounds: Normal heart sounds. No murmur heard. Pulmonary:     Effort: Pulmonary effort is normal. No respiratory distress.     Breath sounds: Normal breath sounds. No wheezing.  Neurological:     Mental Status: She is alert and oriented to person, place, and time.  Psychiatric:        Mood and Affect: Mood normal.        Behavior: Behavior normal.        Assessment/Plan: 1. Moderate persistent asthma without complication Stable, no issues.   2. Atypical chest pain EKG is normal, sinus rhythm. Most likely muscle pain from pulling of breast tissue due to large breasts as discussed.  - EKG 12-Lead  3. OSA on CPAP Stable, no issues. Continue as instructed.   4. CPAP use counseling No concerns or questions regarding maintenance or use of CPAP machine   General Counseling: Onesha verbalizes understanding of the findings of todays visit and agrees with plan of treatment. I have discussed any further diagnostic evaluation that may be needed or ordered today. We also reviewed her medications today. she has been encouraged to call the office with any questions or concerns that should arise related to todays visit.    Orders Placed This Encounter  Procedures    EKG 12-Lead    No orders of the  defined types were placed in this encounter.   Return in about 6 months (around 02/23/2024) for F/U, pulmonary/sleep, Lynnetta Tom.   Total time spent:30 Minutes Time spent includes review of chart, medications, test results, and follow up plan with the patient.   [place] Controlled Substance Database was reviewed by me.  This patient was seen by Sallyanne Kuster, FNP-C in collaboration with Dr. Beverely Risen as a part of collaborative care agreement.   Stana Bayon R. Tedd Sias, MSN, FNP-C Internal medicine

## 2023-08-26 ENCOUNTER — Encounter: Payer: Self-pay | Admitting: Nurse Practitioner

## 2023-09-05 ENCOUNTER — Ambulatory Visit: Payer: Medicare Other | Admitting: Physician Assistant

## 2023-09-08 ENCOUNTER — Other Ambulatory Visit: Payer: Self-pay | Admitting: Physician Assistant

## 2023-09-08 ENCOUNTER — Other Ambulatory Visit: Payer: Self-pay | Admitting: Dermatology

## 2023-09-08 DIAGNOSIS — M792 Neuralgia and neuritis, unspecified: Secondary | ICD-10-CM

## 2023-09-08 DIAGNOSIS — L930 Discoid lupus erythematosus: Secondary | ICD-10-CM

## 2023-09-12 ENCOUNTER — Ambulatory Visit
Admission: RE | Admit: 2023-09-12 | Discharge: 2023-09-12 | Disposition: A | Discharge status: Home / Self Care | Payer: Medicare Other | Source: Ambulatory Visit | Attending: Physician Assistant | Admitting: Physician Assistant

## 2023-09-12 ENCOUNTER — Other Ambulatory Visit: Payer: Self-pay | Admitting: Physician Assistant

## 2023-09-12 DIAGNOSIS — Z1231 Encounter for screening mammogram for malignant neoplasm of breast: Secondary | ICD-10-CM | POA: Diagnosis not present

## 2023-09-15 ENCOUNTER — Ambulatory Visit (INDEPENDENT_AMBULATORY_CARE_PROVIDER_SITE_OTHER): Payer: Medicare Other | Admitting: Physician Assistant

## 2023-09-15 ENCOUNTER — Encounter: Payer: Self-pay | Admitting: Physician Assistant

## 2023-09-15 VITALS — BP 130/82 | HR 72 | Temp 98.4°F | Resp 16 | Ht 66.0 in | Wt 218.4 lb

## 2023-09-15 DIAGNOSIS — J4520 Mild intermittent asthma, uncomplicated: Secondary | ICD-10-CM

## 2023-09-15 DIAGNOSIS — J0100 Acute maxillary sinusitis, unspecified: Secondary | ICD-10-CM

## 2023-09-15 DIAGNOSIS — Z3041 Encounter for surveillance of contraceptive pills: Secondary | ICD-10-CM | POA: Diagnosis not present

## 2023-09-15 DIAGNOSIS — N390 Urinary tract infection, site not specified: Secondary | ICD-10-CM

## 2023-09-15 DIAGNOSIS — R300 Dysuria: Secondary | ICD-10-CM

## 2023-09-15 LAB — POCT URINALYSIS DIPSTICK
Blood, UA: NEGATIVE
Glucose, UA: NEGATIVE
Ketones, UA: POSITIVE
Nitrite, UA: POSITIVE
Protein, UA: POSITIVE — AB
Spec Grav, UA: 1.025 (ref 1.010–1.025)
Urobilinogen, UA: 0.2 U/dL
pH, UA: 5 (ref 5.0–8.0)

## 2023-09-15 MED ORDER — AZITHROMYCIN 250 MG PO TABS: ORAL_TABLET | ORAL | 0 refills | Status: AC

## 2023-09-15 MED ORDER — MONTELUKAST SODIUM 10 MG PO TABS: 10.0000 mg | ORAL_TABLET | Freq: Every day | ORAL | 3 refills | Status: DC

## 2023-09-15 MED ORDER — NORETHINDRONE ACET-ETHINYL EST 1.5-30 MG-MCG PO TABS: 1.0000 | ORAL_TABLET | Freq: Every day | ORAL | 1 refills | Status: DC

## 2023-09-15 NOTE — Progress Notes (Signed)
[facility] [address] [place], [place] [address]  Internal MEDICINE  Office Visit Note  Patient Name: Morgan Kent  [id number]  [id number]  Date of Service: 09/23/2023  Chief Complaint  Patient presents with   Follow-up   Hypertension   Depression   Hot Flashes    Patient states she hasn't been feeling well, had some episodes of hot flashes over the last 8 days.   Sinusitis    Patient also has been having sinus congestion over the last week, tried Mucinex.   GI Problem    Patient states every time she eats, she has to use the restroom immediately. Does not have diarrhea.    HPI Pt is here for routine follow up with acute symptoms -For the past week or so has not been feeling. Has been taking mucinex DM and has helped cough some. Having hard time with cpap due to sinus congestion -also has abdominal cramping and frequent BM for the last week. No diarrhea,but having frequency -Working with dentist for dentures and not resolving issue still.  -has been off birth control for a long time due to pharmacy not filling it.  -odor to urine and cloudy  Current Medication: Outpatient Encounter Medications as of 09/15/2023  Medication Sig   albuterol (VENTOLIN HFA) 108 (90 Base) MCG/ACT inhaler Inhale 2 puffs into the lungs daily.   amLODipine (NORVASC) 5 MG tablet TAKE 1 TABLET(5 MG) BY MOUTH DAILY   [EXPIRED] azithromycin (ZITHROMAX) 250 MG tablet Take 2 tablets on day 1, then 1 tablet daily on days 2 through 5   Budeson-Glycopyrrol-Formoterol (BREZTRI AEROSPHERE) 160-9-4.8 MCG/ACT AERO INHALE 2 PUFFS BY MOUTH INTO THE LUNGS TWICE DAILY   carvedilol (COREG) 25 MG tablet TAKE 1 TABLET(25 MG) BY MOUTH TWICE DAILY/PLEASE CALL OFFICE TO SCHEDULE APPOINTMENT PRIOR TO NEXT REFILL   cetirizine (ZYRTEC) 10 MG tablet Take 1 tablet (10 mg total) by mouth daily.   Cyanocobalamin (B-12 COMPLIANCE INJECTION) 1000 MCG/ML KIT Inject as directed.   cyclobenzaprine (FLEXERIL) 10 MG  tablet Take 1 tablet (10 mg total) by mouth 3 (three) times daily as needed.   diclofenac Sodium (VOLTAREN) 1 % GEL APPLY 2 GRAMS TOPICALLY TO THE AFFECTED AREA FOUR TIMES DAILY   FEROSUL 325 (65 Fe) MG tablet TAKE 1 TABLET BY MOUTH DAILY WITH BREAKFAST   fluticasone (FLONASE) 50 MCG/ACT nasal spray USE 1 SPRAY IN EACH NOSTRIL AT BEDTIME   folic acid (FOLVITE) 1 MG tablet Take 1 mg by mouth daily.   gabapentin (NEURONTIN) 300 MG capsule TAKE 1 CAPSULE(300 MG) BY MOUTH THREE TIMES DAILY   hydrochlorothiazide (HYDRODIURIL) 12.5 MG tablet TAKE 1 TABLET(12.5 MG) BY MOUTH DAILY   hydroxychloroquine (PLAQUENIL) 200 MG tablet Take 1 tablet (200 mg total) by mouth daily.   lisinopril (ZESTRIL) 5 MG tablet Take 1 tablet (5 mg total) by mouth daily.   meloxicam (MOBIC) 7.5 MG tablet TAKE 1 TABLET(7.5 MG) BY MOUTH TWICE DAILY   methotrexate (RHEUMATREX) 2.5 MG tablet Take 2.5 mg by mouth once a week. Caution:Chemotherapy. Protect from light.   NON FORMULARY CPAP with AHP   oxyCODONE-acetaminophen (PERCOCET) 7.5-325 MG tablet Take 1 tablet by mouth every 4 (four) hours as needed for severe pain.   Polyethyl Glycol-Propyl Glycol (SYSTANE ULTRA) 0.4-0.3 % SOLN Place 1 drop into both eyes daily as needed (for dry eyes).    predniSONE (STERAPRED UNI-PAK 21 TAB) 10 MG (21) TBPK tablet Use as directed for 6 days   triamcinolone cream (KENALOG) 0.1 %  APPLY EXTERNALLY TO THE AFFECTED AREA TWICE DAILY AS NEEDED FOR UP TO 5 DAYS A WEEK   Vitamin D, Ergocalciferol, (DRISDOL) 1.25 MG (50000 UNIT) CAPS capsule TAKE 1 CAPSULE BY MOUTH EVERY WEEK   [DISCONTINUED] amoxicillin-clavulanate (AUGMENTIN) 875-125 MG tablet Take 1 tablet by mouth 2 (two) times daily.   [DISCONTINUED] clobetasol (TEMOVATE) 0.05 % external solution Apply 1 application. topically 2 (two) times daily as needed. Apply to scalp   [DISCONTINUED] Dapsone 7.5 % GEL APPLY TOPICALLY TO THE AFFECTED AREA DAILY   [DISCONTINUED] fluconazole (DIFLUCAN) 200 MG  tablet Take 1 tablet (200 mg total) by mouth once a week. Take 1 po 1 time a week for 2 weeks prn yeast flares around the mouth   [DISCONTINUED] ketoconazole (NIZORAL) 2 % shampoo LATHER ON SCALP ONCE TO TWICE A WEEK, LEAVE ON 6 TO 8 MINUTES, THEN RINSE WELL   [DISCONTINUED] mometasone (ELOCON) 0.1 % lotion Apply topically as directed. Qd up to 5 days a week to affected area of scalp as needed for flares   [DISCONTINUED] mometasone (ELOCON) 0.1 % ointment APPLY TOPICALLY TO SORES ON FACE DAILY up to 5 days a week AS NEEDED FOR FLARES   [DISCONTINUED] montelukast (SINGULAIR) 10 MG tablet TAKE 1 TABLET(10 MG) BY MOUTH AT BEDTIME   [DISCONTINUED] mupirocin ointment (BACTROBAN) 2 % Apply 1 Application topically daily. Qd to open sores face, body prn flares   [DISCONTINUED] nitrofurantoin, macrocrystal-monohydrate, (MACROBID) 100 MG capsule Take 100 mg by mouth 2 (two) times daily.   [DISCONTINUED] Norethindrone Acetate-Ethinyl Estradiol (JUNEL 1.5/30) 1.5-30 MG-MCG tablet Take 1 tablet by mouth daily.   [DISCONTINUED] tacrolimus (PROTOPIC) 0.1 % ointment TWICE DAILY TO CORNERS AS MUCH AS NEEDED   montelukast (SINGULAIR) 10 MG tablet Take 1 tablet (10 mg total) by mouth at bedtime.   Norethindrone Acetate-Ethinyl Estradiol (JUNEL 1.5/30) 1.5-30 MG-MCG tablet Take 1 tablet by mouth daily.   No facility-administered encounter medications on file as of 09/15/2023.    Surgical History: Past Surgical History:  Procedure Laterality Date   carpel tunnel  Left 2014   CRANIOTOMY  14,06   chiari   DENTAL SURGERY     HERNIA REPAIR     umbilical   PLACEMENT OF LUMBAR DRAIN N/A 07/07/2014   Procedure: PLACEMENT OF LUMBAR DRAIN AND CLOSURE OF CERVICAL INCISION;  Surgeon: Tressie Stalker, MD;  Location: [facility];  Service: Neurosurgery;  Laterality: N/A;   SUBOCCIPITAL CRANIECTOMY CERVICAL LAMINECTOMY N/A 09/29/2013   Procedure: SUBOCCIPITAL CRANIECTOMY CERVICAL LAMINECTOMY/DURAPLASTY;  Surgeon: Cristi Loron, MD;  Location: [facility];  Service: Neurosurgery;  Laterality: N/A;  posterior   SUBOCCIPITAL CRANIECTOMY CERVICAL LAMINECTOMY N/A 06/23/2014   Procedure: SUBOCCIPITAL CRANIECTOMY CERVICAL LAMINECTOMY/DURAPLASTY;  Surgeon: Tressie Stalker, MD;  Location: [facility];  Service: Neurosurgery;  Laterality: N/A;  suboccipital craniectomy with cervical laminectomy and duraplasty    Medical History: Past Medical History:  Diagnosis Date   Anxiety    Arthritis    rheumo possible   Chiari malformation    Depression    Dizziness    Headache(784.0)    History of kidney stones    Hypertension    Lupus    Lupus    Nerve damage    NECK   PONV (postoperative nausea and vomiting)    nausea only   Sleep apnea    TMJ arthritis     Family History: Family History  Problem Relation Age of Onset   Hypertension Mother    Thyroid disease Mother  Heart disease Father    Hypertension Father    Breast cancer Other 44 - 79       maternal great grandmaw   Asthma Other    Depression Other    Diabetes Other    Migraines Other    Stroke Other     Social History   Socioeconomic History   Marital status: Single    Spouse name: Not on file   Number of children: Not on file   Years of education: Not on file   Highest education level: Not on file  Occupational History   Not on file  Tobacco Use   Smoking status: Never   Smokeless tobacco: Never  Vaping Use   Vaping status: Never Used  Substance and Sexual Activity   Alcohol use: No    Alcohol/week: 0.0 standard drinks of alcohol   Drug use: No   Sexual activity: Not on file  Other Topics Concern   Not on file  Social History Narrative   Lives with 3 children in 2 story home.  Unemployed.  Trying to get disability.  Education: high school.   Social Determinants of Health   Financial Resource Strain: Not on file  Food Insecurity: Not on file  Transportation Needs: Not on file  Physical Activity: Not on file  Stress:  Not on file  Social Connections: Not on file  Intimate Partner Violence: Not on file      Review of Systems  Constitutional:  Positive for fatigue. Negative for chills and unexpected weight change.  HENT:  Positive for congestion and postnasal drip. Negative for sneezing and sore throat.   Eyes:  Negative for redness.  Respiratory:  Positive for cough. Negative for chest tightness and shortness of breath.   Cardiovascular:  Negative for chest pain and palpitations.  Gastrointestinal:  Positive for abdominal pain. Negative for constipation, diarrhea, nausea and vomiting.  Genitourinary:  Positive for dysuria. Negative for frequency.  Musculoskeletal:  Positive for arthralgias. Negative for back pain, joint swelling and neck pain.  Skin:  Negative for rash.  Neurological: Negative.  Negative for tremors and numbness.  Hematological:  Negative for adenopathy. Does not bruise/bleed easily.  Psychiatric/Behavioral:  Negative for behavioral problems (Depression), sleep disturbance and suicidal ideas. The patient is not nervous/anxious.     Vital Signs: BP 130/82   Pulse 72   Temp 98.4 F (36.9 C)   Resp 16   Ht 5\' 6"  (1.676 m)   Wt 218 lb 6.4 oz (99.1 kg)   LMP 08/29/2023   SpO2 99%   BMI 35.25 kg/m    Physical Exam Vitals reviewed.  Constitutional:      General: She is not in acute distress.    Appearance: Normal appearance. She is obese. She is not ill-appearing.  HENT:     Head: Normocephalic and atraumatic.  Eyes:     Pupils: Pupils are equal, round, and reactive to light.  Cardiovascular:     Rate and Rhythm: Normal rate and regular rhythm.     Heart sounds: Normal heart sounds. No murmur heard. Pulmonary:     Effort: Pulmonary effort is normal. No respiratory distress.     Breath sounds: Normal breath sounds. No wheezing.  Neurological:     Mental Status: She is alert and oriented to person, place, and time.  Psychiatric:        Mood and Affect: Mood normal.         Behavior: Behavior normal.  Assessment/Plan: 1. Acute non-recurrent maxillary sinusitis May continue mucinex and will start on zpak - azithromycin (ZITHROMAX) 250 MG tablet; Take 2 tablets on day 1, then 1 tablet daily on days 2 through 5  Dispense: 6 tablet; Refill: 0  2. Urinary tract infection without hematuria, site unspecified - CULTURE, URINE COMPREHENSIVE  3. Dysuria - POCT Urinalysis Dipstick  4. Mild intermittent asthma without complication - montelukast (SINGULAIR) 10 MG tablet; Take 1 tablet (10 mg total) by mouth at bedtime.  Dispense: 90 tablet; Refill: 3  5. Encounter for surveillance of contraceptive pills Will try sending again - Norethindrone Acetate-Ethinyl Estradiol (JUNEL 1.5/30) 1.5-30 MG-MCG tablet; Take 1 tablet by mouth daily.  Dispense: 84 tablet; Refill: 1   General Counseling: Ajanae verbalizes understanding of the findings of todays visit and agrees with plan of treatment. I have discussed any further diagnostic evaluation that may be needed or ordered today. We also reviewed her medications today. she has been encouraged to call the office with any questions or concerns that should arise related to todays visit.    Orders Placed This Encounter  Procedures   CULTURE, URINE COMPREHENSIVE   POCT Urinalysis Dipstick    Meds ordered this encounter  Medications   montelukast (SINGULAIR) 10 MG tablet    Sig: Take 1 tablet (10 mg total) by mouth at bedtime.    Dispense:  90 tablet    Refill:  3   Norethindrone Acetate-Ethinyl Estradiol (JUNEL 1.5/30) 1.5-30 MG-MCG tablet    Sig: Take 1 tablet by mouth daily.    Dispense:  84 tablet    Refill:  1   azithromycin (ZITHROMAX) 250 MG tablet    Sig: Take 2 tablets on day 1, then 1 tablet daily on days 2 through 5    Dispense:  6 tablet    Refill:  0    This patient was seen by Lynn Ito, PA-C in collaboration with Dr. Beverely Risen as a part of collaborative care agreement.   Total  time spent:30 Minutes Time spent includes review of chart, medications, test results, and follow up plan with the patient.      Dr Lyndon Code Internal medicine

## 2023-09-17 ENCOUNTER — Ambulatory Visit (INDEPENDENT_AMBULATORY_CARE_PROVIDER_SITE_OTHER): Payer: Medicare Other | Admitting: Dermatology

## 2023-09-17 ENCOUNTER — Other Ambulatory Visit: Payer: Self-pay

## 2023-09-17 DIAGNOSIS — Z7189 Other specified counseling: Secondary | ICD-10-CM

## 2023-09-17 DIAGNOSIS — Z79899 Other long term (current) drug therapy: Secondary | ICD-10-CM | POA: Diagnosis not present

## 2023-09-17 DIAGNOSIS — L700 Acne vulgaris: Secondary | ICD-10-CM | POA: Diagnosis not present

## 2023-09-17 DIAGNOSIS — K130 Diseases of lips: Secondary | ICD-10-CM

## 2023-09-17 DIAGNOSIS — L930 Discoid lupus erythematosus: Secondary | ICD-10-CM | POA: Diagnosis not present

## 2023-09-17 DIAGNOSIS — K121 Other forms of stomatitis: Secondary | ICD-10-CM

## 2023-09-17 DIAGNOSIS — M3219 Other organ or system involvement in systemic lupus erythematosus: Secondary | ICD-10-CM

## 2023-09-17 MED ORDER — DAPSONE 7.5 % EX GEL: 1.0000 | CUTANEOUS | 5 refills | Status: DC

## 2023-09-17 MED ORDER — TACROLIMUS 1 MG PO CAPS: ORAL_CAPSULE | ORAL | 5 refills | Status: DC

## 2023-09-17 MED ORDER — TACROLIMUS 0.1 % EX OINT: TOPICAL_OINTMENT | CUTANEOUS | 5 refills | Status: DC

## 2023-09-17 MED ORDER — CLOTRIMAZOLE 10 MG MT TROC: 10.0000 mg | Freq: Every day | OROMUCOSAL | 5 refills | Status: DC

## 2023-09-17 MED ORDER — KETOCONAZOLE 2 % EX SHAM: MEDICATED_SHAMPOO | CUTANEOUS | 6 refills | Status: DC

## 2023-09-17 MED ORDER — MOMETASONE FUROATE 0.1 % EX OINT: TOPICAL_OINTMENT | CUTANEOUS | 5 refills | Status: DC

## 2023-09-17 MED ORDER — CLOBETASOL PROPIONATE 0.05 % EX OINT: 1.0000 | TOPICAL_OINTMENT | CUTANEOUS | 5 refills | Status: DC

## 2023-09-17 MED ORDER — MOMETASONE FUROATE 0.1 % EX SOLN: CUTANEOUS | 5 refills | Status: DC

## 2023-09-17 MED ORDER — MUPIROCIN 2 % EX OINT: 1.0000 | TOPICAL_OINTMENT | Freq: Every day | CUTANEOUS | 5 refills | Status: DC

## 2023-09-17 NOTE — Progress Notes (Signed)
Follow-Up Visit   Subjective  Morgan Kent is a 42 y.o. female who presents for the following: Discoid lupus face, scalp, arms 1m f/u, Mometasone sol prn scalp, TMC 0.1% cr prn, Mupirocin oint qd to open sores, Ketoconazole 2% shampoo 1x/wk, Acne face 50m f/u, Dapsone 7.5% gel qd, Angular Cheilitis Tacrolimus 0.1% oint The patient has spots, moles and lesions to be evaluated, some may be new or changing and the patient may have concern these could be cancer.   The following portions of the chart were reviewed this encounter and updated as appropriate: medications, allergies, medical history  Review of Systems:  No other skin or systemic complaints except as noted in HPI or Assessment and Plan.  Objective  Well appearing patient in no apparent distress; mood and affect are within normal limits.   A focused examination was performed of the following areas: Face, arms  Relevant exam findings are noted in the Assessment and Plan.           Assessment & Plan   DISCOID LUPUS ERYTHEMATOSUS Face, scalp, arms Exam: minimal scarring and hyperpigmentation to face, plaques elbows  Chronic and persistent condition with duration or expected duration over one year. Condition is bothersome/symptomatic for patient. Currently flared.   Treatment Plan: cont Mometasone oint qd up to 5d/wk prn face Cont Mometasone sol qd up to 5d/wk prn to aa scalp D/C TMC 0.1%  cr  Start Clobetasol oint qd up to 5d/wk aa elbows utnil clear, then prn flares Cont Mupirocin oint qd prn to open sores Cont Ketoconazole 2% shampoo 1x/wk, let sit 5 minutes and rinse out  Discussed IL injections  Topical steroids (such as triamcinolone, fluocinolone, fluocinonide, mometasone, clobetasol, halobetasol, betamethasone, hydrocortisone) can cause thinning and lightening of the skin if they are used for too long in the same area. Your physician has selected the right strength medicine for your problem and area  affected on the body. Please use your medication only as directed by your physician to prevent side effects.   Long term medication management.  Patient is using long term (months to years) prescription medication  to control their dermatologic condition.  These medications require periodic monitoring to evaluate for efficacy and side effects and may require periodic laboratory monitoring.   SYSTEMIC LUPUS ERYTHEMATOSUS  With Discoid lesions (see above) Body, mouth Exam: pt does have joint aches, hx of ulcers in mouth Treatment Plan: Better Controlled by Yvone Neu PA at [facility] - pt taking plaquenil tablets Pt to cont f/u with Chrissie Noa Defoor PA  For Lupus related mouth ulcers: Start Tacrolimus mouth wash bid prn flares Use tacrolimus swish and spit twice a day as needed. To make the Tacrolimus Swish and Spit, dissolve a 1-mg tacrolimus capsule into 500 mL of water. Swish the solution around your mouth for 2 minutes before spitting it out.  Start Clotrimazole troche qd, Suck one clotrimazole troche daily to prevent thrush.     ACNE VULGARIS face Exam: face clear today Chronic and persistent condition with duration or expected duration over one year. Condition is improving with treatment but not currently at goal. Treatment Plan: Cont Dapsone 7.5% gel qd  Long term medication management.  Patient is using long term (months to years) prescription medication  to control their dermatologic condition.  These medications require periodic monitoring to evaluate for efficacy and side effects and may require periodic laboratory monitoring.   Angular CHEILITIS with  hx of CANDIDA Corners of mouth Exam corners of mouth  clear today Treatment Plan Cont Tacrolimus 0.1% oint qd/bid prn flares  Start Clotrimazole troche daily to prevent thrush   Discoid lupus erythematosus  Related Medications triamcinolone cream (KENALOG) 0.1 % APPLY EXTERNALLY TO THE AFFECTED AREA TWICE  DAILY AS NEEDED FOR UP TO 5 DAYS A WEEK  mometasone (ELOCON) 0.1 % ointment APPLY TOPICALLY TO SORES ON FACE DAILY up to 5 days a week AS NEEDED FOR FLARES  Acne vulgaris  Related Medications Dapsone 7.5 % GEL Apply 1 Application topically as directed. qd to bid to face for acne  Return in about 6 months (around 03/16/2024) for Lupus, acne, cheilitis f/u.  I, Ardis Rowan, RMA, am acting as scribe for Armida Sans, MD .   Documentation: I have reviewed the above documentation for accuracy and completeness, and I agree with the above.  Armida Sans, MD

## 2023-09-17 NOTE — Patient Instructions (Addendum)
Medication for Lupus   Continue Mometasone ointment  once daily up to 5days a week to face as needed for flares Continue Mometasone solution daily up to 5 days a week to affected area of scalp as needed for flares Stop TMC 0.1%  cream  Start Clobetasol ointment once daily up to 5 days a week as needed for flares on arms, avoid face, groin, axilla Continue Mupirocin ointment once daily to open sores Continue Ketoconazole 2% shampoo 1 time a week to wash scalp, let sit 5 minutes and rinse out   For Acne Continue Dapsone 7.5% gel once a day   For Cheilitis (corners of mouth) Continue Tacrolimus 0.1% ointment 1-2 times a day as needed for flares Start Clotrimazole troches once daily to prevent thrush  For Systemic Lupus Start Tacrolimus mouth wash as directed below Use tacrolimus swish and spit twice a day as needed. To make the Tacrolimus Swish and Spit, dissolve a 1-mg tacrolimus capsule into 500 mL of water. Swish the solution around your mouth for 2 minutes before spitting it out.  Start clotrimazole troche daily to prevent thrush. Suck on the troche once daily              Due to recent changes in healthcare laws, you may see results of your pathology and/or laboratory studies on MyChart before the doctors have had a chance to review them. We understand that in some cases there may be results that are confusing or concerning to you. Please understand that not all results are received at the same time and often the doctors may need to interpret multiple results in order to provide you with the best plan of care or course of treatment. Therefore, we ask that you please give [place] 2 business days to thoroughly review all your results before contacting the office for clarification. Should we see a critical lab result, you will be contacted sooner.   If You Need Anything After Your Visit  If you have any questions or concerns for your doctor, please call our main line at [phone] and  press option 4 to reach your doctor's medical assistant. If no one answers, please leave a voicemail as directed and we will return your call as soon as possible. Messages left after 4 pm will be answered the following business day.   You may also send [place] a message via MyChart. We typically respond to MyChart messages within 1-2 business days.  For prescription refills, please ask your pharmacy to contact our office. Our fax number is [phone].  If you have an urgent issue when the clinic is closed that cannot wait until the next business day, you can page your doctor at the number below.    Please note that while we do our best to be available for urgent issues outside of office hours, we are not available 24/7.   If you have an urgent issue and are unable to reach [place], you may choose to seek medical care at your doctor's office, retail clinic, urgent care center, or emergency room.  If you have a medical emergency, please immediately call 911 or go to the emergency department.  Pager Numbers  - Dr. Gwen Pounds: [phone]  - Dr. Roseanne Reno: [phone]  - Dr. Katrinka Blazing: [phone]   In the event of inclement weather, please call our main line at [phone] for an update on the status of any delays or closures.  Dermatology Medication Tips: Please keep the boxes that topical medications come in in order  to help keep track of the instructions about where and how to use these. Pharmacies typically print the medication instructions only on the boxes and not directly on the medication tubes.   If your medication is too expensive, please contact our office at [phone] option 4 or send [place] a message through MyChart.   We are unable to tell what your co-pay for medications will be in advance as this is different depending on your insurance coverage. However, we may be able to find a substitute medication at lower cost or fill out paperwork to get insurance to cover a needed medication.    If a prior authorization is required to get your medication covered by your insurance company, please allow [place] 1-2 business days to complete this process.  Drug prices often vary depending on where the prescription is filled and some pharmacies may offer cheaper prices.  The website www.goodrx.com contains coupons for medications through different pharmacies. The prices here do not account for what the cost may be with help from insurance (it may be cheaper with your insurance), but the website can give you the price if you did not use any insurance.  - You can print the associated coupon and take it with your prescription to the pharmacy.  - You may also stop by our office during regular business hours and pick up a GoodRx coupon card.  - If you need your prescription sent electronically to a different pharmacy, notify our office through [facility] or by phone at [phone] option 4.     Si Usted Necesita Algo Despus de Su Visita  Tambin puede enviarnos un mensaje a travs de Clinical cytogeneticist. Por lo general respondemos a los mensajes de MyChart en el transcurso de 1 a 2 das hbiles.  Para renovar recetas, por favor pida a su farmacia que se ponga en contacto con nuestra oficina. Annie Sable de fax es [place] [phone].  Si tiene un asunto urgente cuando la clnica est cerrada y que no puede esperar hasta el siguiente da hbil, puede llamar/localizar a su doctor(a) al nmero que aparece a continuacin.   Por favor, tenga en cuenta que aunque hacemos todo lo posible para estar disponibles para asuntos urgentes fuera del horario de [place], no estamos disponibles las 24 horas del da, los 7 [address]  [address] de la [place].   Si tiene un problema urgente y no puede comunicarse con nosotros, puede optar por buscar atencin mdica  en el consultorio de su doctor(a), en una clnica privada, en un centro de atencin urgente o en una sala de emergencias.  Si tiene Engineer, drilling, por favor  llame inmediatamente al 911 o vaya a la sala de emergencias.  Nmeros de bper  - Dr. Gwen Pounds: [phone]  - Dra. Roseanne Reno: [phone]  - Dr. Katrinka Blazing: [phone]   En caso de inclemencias del tiempo, por favor llame a Lacy Duverney principal al [phone] para una actualizacin sobre el [place] de cualquier retraso o cierre.  Consejos para la medicacin en dermatologa: Por favor, guarde las cajas en las que vienen los medicamentos de uso tpico para ayudarle a seguir las instrucciones sobre dnde y cmo usarlos. Las farmacias generalmente imprimen las instrucciones del medicamento slo en las cajas y no directamente en los tubos del [place].   Si su medicamento es muy caro, por favor, pngase en contacto con Rolm Gala llamando al [phone] y presione la opcin 4 o envenos un mensaje a travs de Clinical cytogeneticist.   No podemos decirle cul ser su  copago por los medicamentos por adelantado ya que esto es diferente dependiendo de la cobertura de su seguro. Sin embargo, es posible que podamos encontrar un medicamento sustituto a Audiological scientist un formulario para que el seguro cubra el medicamento que se considera necesario.   Si se requiere una autorizacin previa para que su compaa de seguros [place] su medicamento, por favor permtanos de 1 a 2 das hbiles para completar [address]  Los precios de los medicamentos varan con frecuencia dependiendo del Environmental consultant de dnde se surte la receta y alguna farmacias pueden ofrecer precios ms baratos.  El sitio web www.goodrx.com tiene cupones para medicamentos de Health and safety inspector. Los precios aqu no tienen en cuenta lo que podra costar con la ayuda del seguro (puede ser ms barato con su seguro), pero el sitio web puede darle el precio si no utiliz Tourist information centre manager.  - Puede imprimir el cupn correspondiente y llevarlo con su receta a la farmacia.  - Tambin puede pasar por nuestra oficina durante el horario de atencin regular y  Education officer, museum una tarjeta de cupones de GoodRx.  - Si necesita que su receta se enve electrnicamente a una farmacia diferente, informe a nuestra oficina a travs de MyChart de [place] o por telfono llamando al [phone] y presione la opcin 4.

## 2023-09-18 LAB — CULTURE, URINE COMPREHENSIVE

## 2023-09-19 DIAGNOSIS — Z713 Dietary counseling and surveillance: Secondary | ICD-10-CM | POA: Diagnosis not present

## 2023-09-19 DIAGNOSIS — Z79891 Long term (current) use of opiate analgesic: Secondary | ICD-10-CM | POA: Diagnosis not present

## 2023-09-19 DIAGNOSIS — M5136 Other intervertebral disc degeneration, lumbar region with discogenic back pain only: Secondary | ICD-10-CM | POA: Diagnosis not present

## 2023-09-19 DIAGNOSIS — Z724 Inappropriate diet and eating habits: Secondary | ICD-10-CM | POA: Diagnosis not present

## 2023-09-19 DIAGNOSIS — G629 Polyneuropathy, unspecified: Secondary | ICD-10-CM | POA: Diagnosis not present

## 2023-09-19 DIAGNOSIS — G894 Chronic pain syndrome: Secondary | ICD-10-CM | POA: Diagnosis not present

## 2023-09-22 ENCOUNTER — Other Ambulatory Visit: Payer: Self-pay

## 2023-09-22 MED ORDER — AMOXICILLIN-POT CLAVULANATE 875-125 MG PO TABS: 1.0000 | ORAL_TABLET | Freq: Two times a day (BID) | ORAL | 0 refills | Status: DC

## 2023-09-23 ENCOUNTER — Encounter: Payer: Self-pay | Admitting: Dermatology

## 2023-09-27 ENCOUNTER — Other Ambulatory Visit: Payer: Self-pay | Admitting: Cardiovascular Disease

## 2023-10-07 ENCOUNTER — Other Ambulatory Visit: Payer: Self-pay | Admitting: Physician Assistant

## 2023-10-07 DIAGNOSIS — D509 Iron deficiency anemia, unspecified: Secondary | ICD-10-CM

## 2023-10-13 IMAGING — US US BREAST*R* LIMITED INC AXILLA
1 series · 13 of 15 positions shown · non-contrast
Comparison: Previous exam(s).

CLINICAL DATA: RIGHT axillary lymph nodes from baseline screening
mammogram

EXAM:
ULTRASOUND OF THE RIGHT BREAST

[Series 1: us breast*right* limited inc axilla · 0.09mm/px · 13 of 15 slices shown]
[im 1/15]
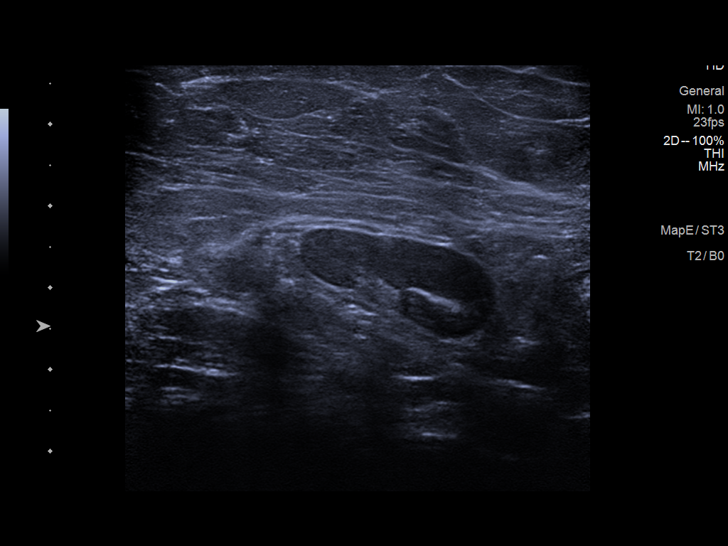
[im 2/15]
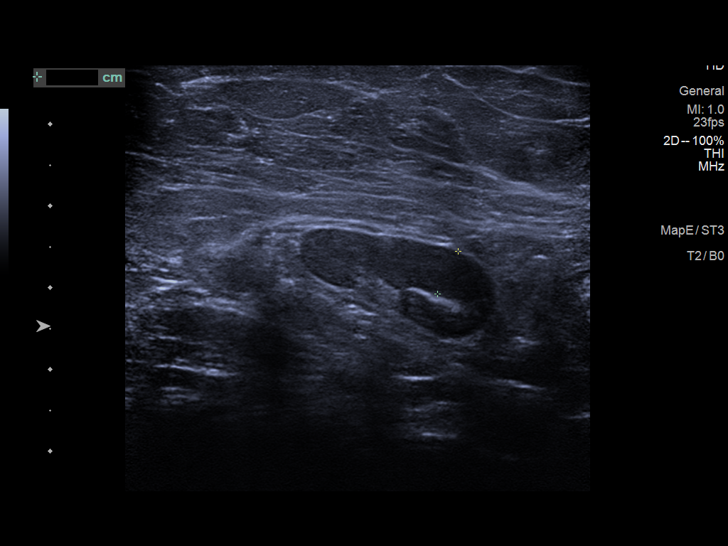
[im 3/15]
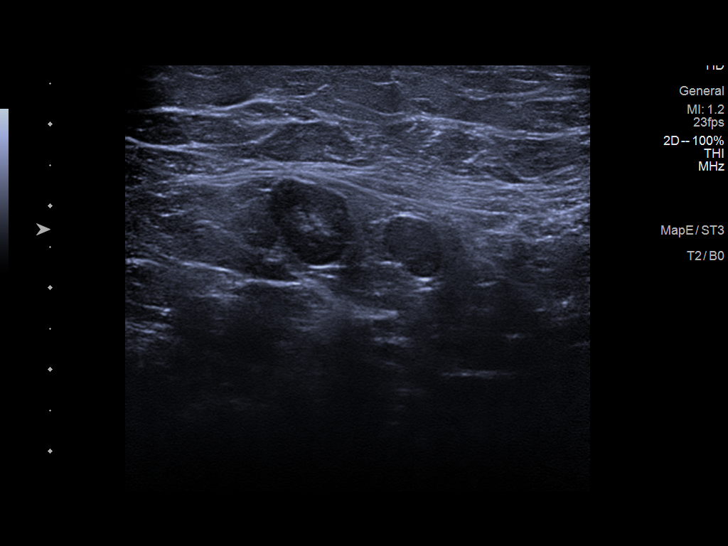
[im 5/15]
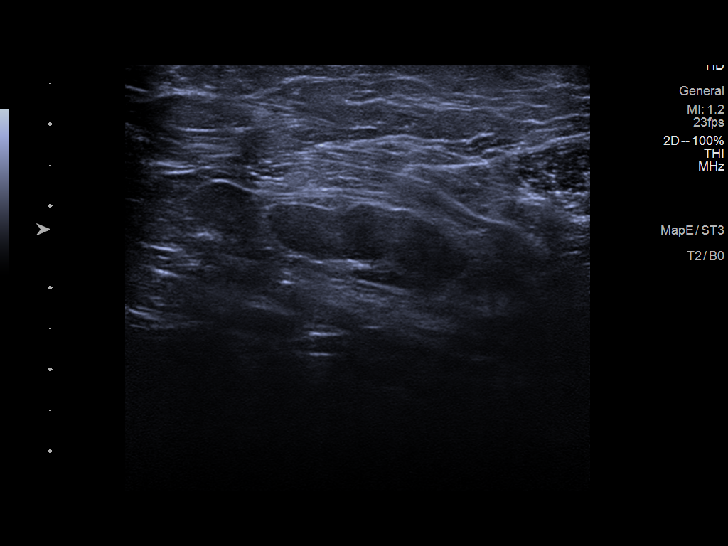
[im 6/15]
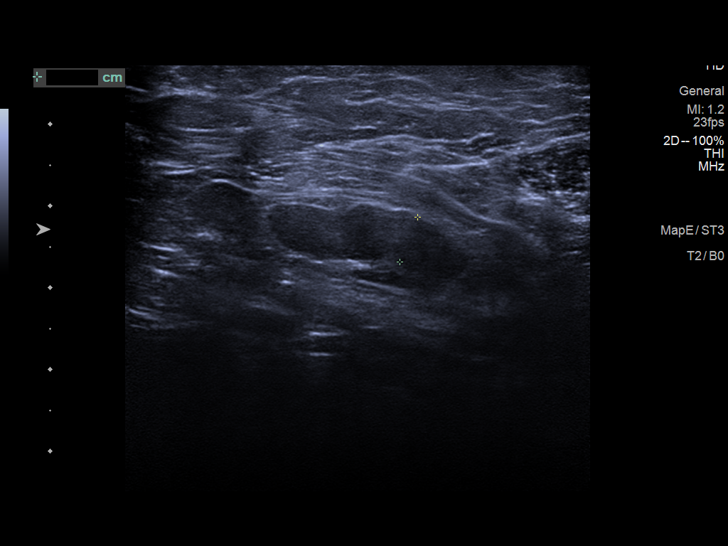
[im 7/15]
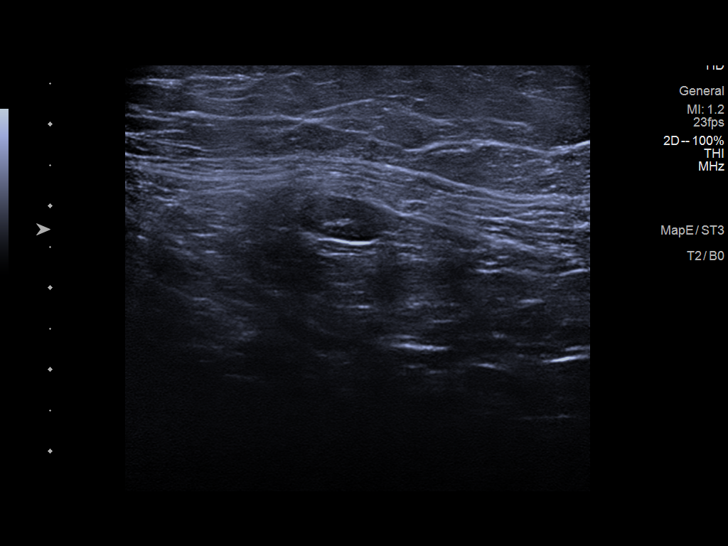
[im 8/15]
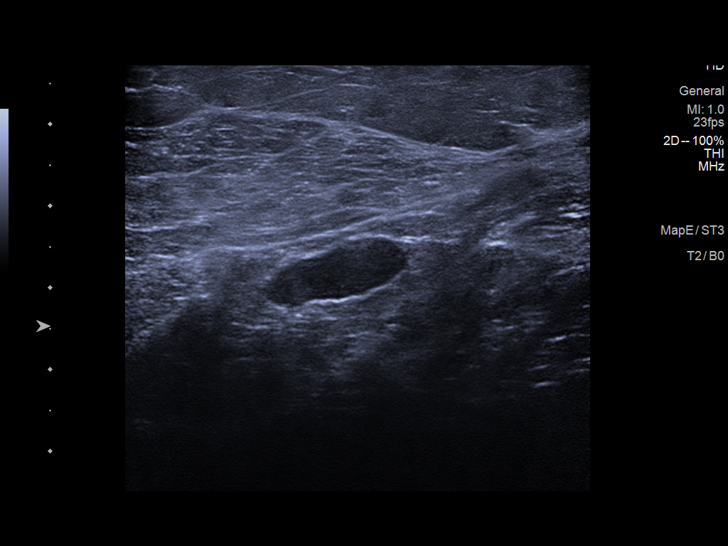
[im 9/15]
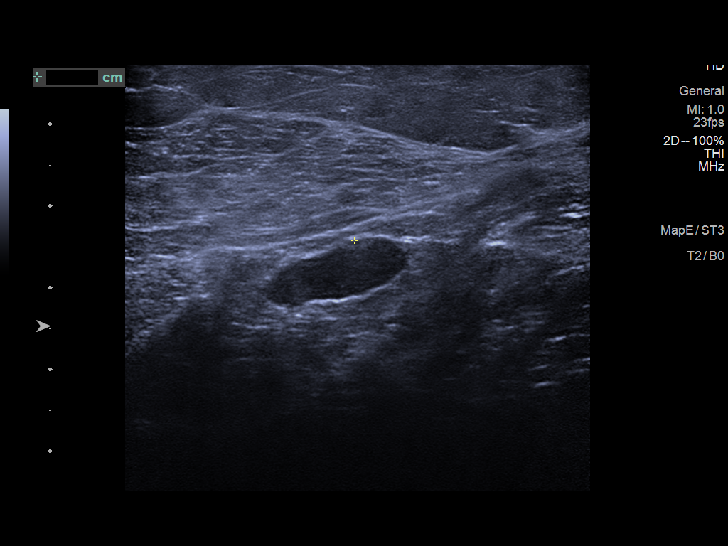
[im 10/15]
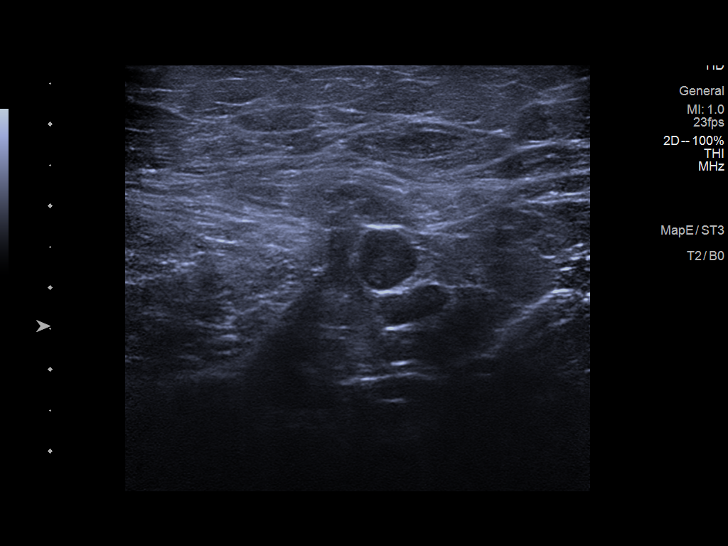
[im 11/15]
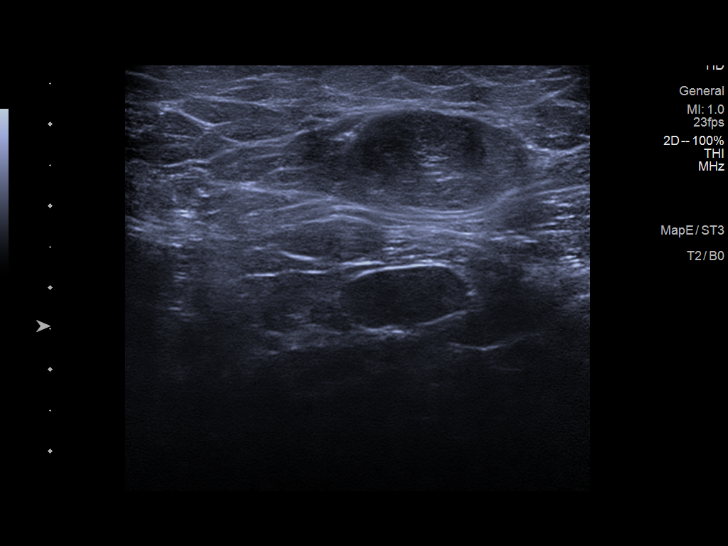
[im 13/15]
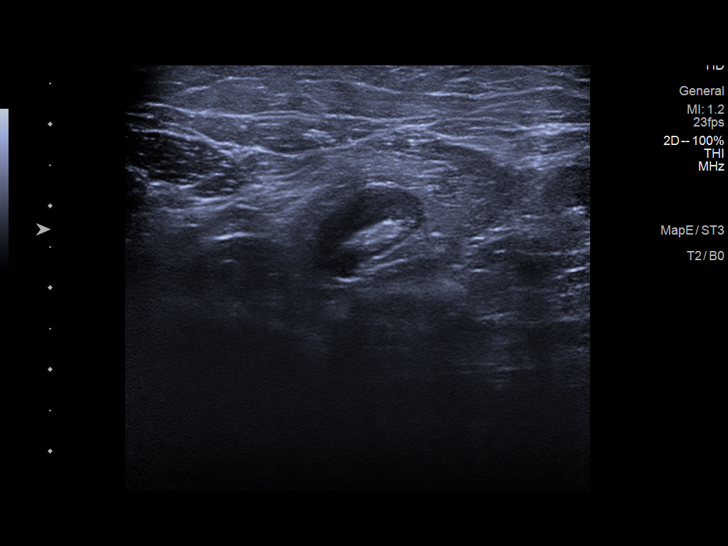
[im 14/15]
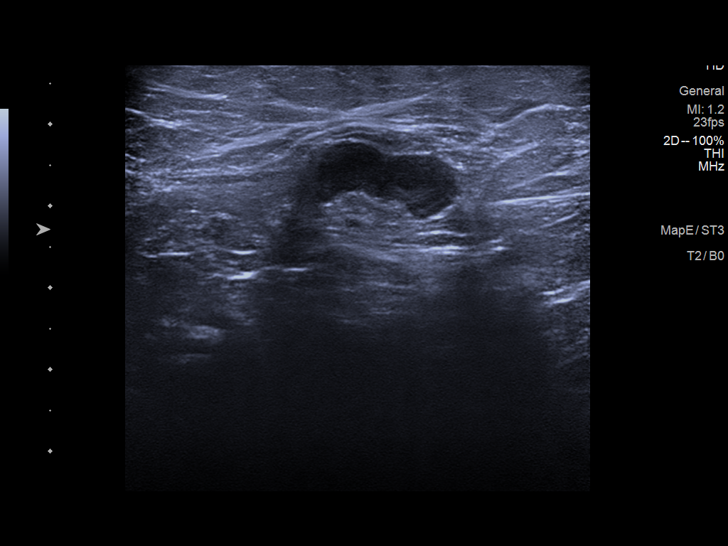
[im 15/15]
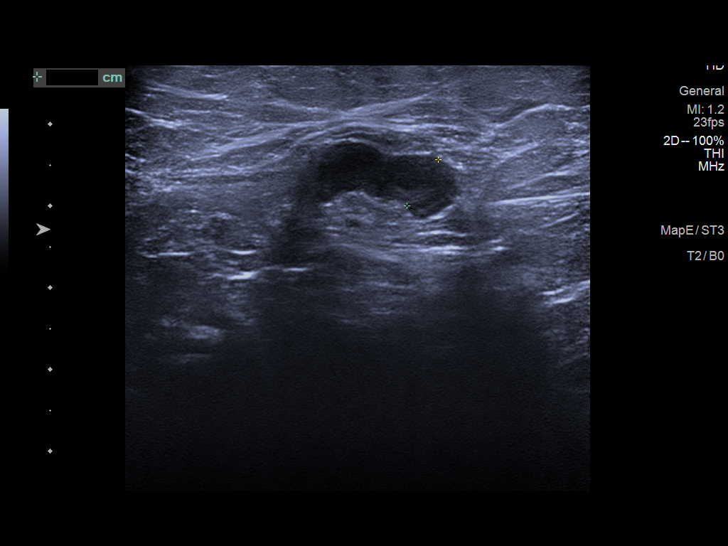

[13 of 15 positions shown; findings below may reference images not displayed]

FINDINGS: Patient reports a history of lupus.

Targeted ultrasound was performed of the RIGHT axilla. There are
several lymph nodes with smoothly thickened cortices and normal
echogenic hila within the RIGHT axilla. Cortices measure
approximately 6 mm in thickness. Targeted ultrasound was performed
of the contralateral axilla for comparison purposes. These
demonstrate symmetric appearing lymph nodes in the LEFT axilla with
smoothly thickened cortices and normal echogenic hila.
IMPRESSION: There is symmetric bilateral axillary adenopathy. No sonographic
evidence of malignancy. Given history of lupus, this is most
consistent with patient's baseline configuration of lymph nodes.

There are multiple potential underlying causes of symmetric
adenopathy (autoimmune disorders, HIV, sarcoidosis or
lymphoproliferative disorders). Given history of lupus, this is
considered benign and concordant. If tissue sampling is deemed
clinically warranted, bilateral axillary lymph nodes are amenable
for ultrasound-guided biopsy.

RECOMMENDATION:
Screening mammogram in one year.(Code:DJ-[id number])

I have discussed the findings and recommendations with the patient.
If applicable, a reminder letter will be sent to the patient
regarding the next appointment.

BI-RADS CATEGORY  2: Benign.

## 2023-10-14 DIAGNOSIS — G935 Compression of brain: Secondary | ICD-10-CM | POA: Diagnosis not present

## 2023-10-17 DIAGNOSIS — M5136 Other intervertebral disc degeneration, lumbar region with discogenic back pain only: Secondary | ICD-10-CM | POA: Diagnosis not present

## 2023-10-17 DIAGNOSIS — Z713 Dietary counseling and surveillance: Secondary | ICD-10-CM | POA: Diagnosis not present

## 2023-10-17 DIAGNOSIS — Z724 Inappropriate diet and eating habits: Secondary | ICD-10-CM | POA: Diagnosis not present

## 2023-10-17 DIAGNOSIS — G894 Chronic pain syndrome: Secondary | ICD-10-CM | POA: Diagnosis not present

## 2023-10-17 DIAGNOSIS — Z79891 Long term (current) use of opiate analgesic: Secondary | ICD-10-CM | POA: Diagnosis not present

## 2023-10-17 DIAGNOSIS — G629 Polyneuropathy, unspecified: Secondary | ICD-10-CM | POA: Diagnosis not present

## 2023-10-19 ENCOUNTER — Other Ambulatory Visit: Payer: Self-pay | Admitting: Dermatology

## 2023-10-19 DIAGNOSIS — L930 Discoid lupus erythematosus: Secondary | ICD-10-CM

## 2023-10-27 ENCOUNTER — Telehealth: Payer: Self-pay | Admitting: Physician Assistant

## 2023-10-27 NOTE — Telephone Encounter (Signed)
Patient called to schedule sick visit. Per Nimisha, patient to go to urgent care since we have no available appointments-[name]

## 2023-10-28 DIAGNOSIS — J069 Acute upper respiratory infection, unspecified: Secondary | ICD-10-CM | POA: Diagnosis not present

## 2023-10-28 DIAGNOSIS — M329 Systemic lupus erythematosus, unspecified: Secondary | ICD-10-CM | POA: Diagnosis not present

## 2023-10-28 DIAGNOSIS — H6692 Otitis media, unspecified, left ear: Secondary | ICD-10-CM | POA: Diagnosis not present

## 2023-10-30 NOTE — Progress Notes (Deleted)
   Cardiology Clinic Note   Date: 10/30/2023 ID: KELLE BROWDER, DOB 1981-09-27, MRN [id number]  Primary Cardiologist:  None  Patient Profile    Morgan Kent is a 42 y.o. female who presents to the clinic today for ***    Past medical history significant for: Chronic systolic heart failure. Echo 07/22/2022: EF > 50%.  No RWMA.  Mild LVH.  Normal diastolic parameters.  Normal RV size/function.  No significant valvular abnormalities. Hypertension. OSA. Chiari malformation. Raynaud's. Lupus. Iron deficiency anemia.  In summary, patient was initially followed by Dr. Elease Hashimoto for chronic systolic heart failure in 2016.  At that time echo showed EF 45%.  She was started on carvedilol and hydrochlorothiazide was continued.  She transferred her care to Dr. Mariah Milling in January 2017.  Repeat echo at that time showed recovered EF 60 to 65%.     History of Present Illness    Morgan Kent is followed by *** for the above outlined history.  Patient was last seen in the office by Dr. Mariah Milling on 10/22/2022 for routine follow-up.  PVCs noted on EKG.  No medication changes were made.  Today, patient ***  Chronic systolic heart failure with recovered EF Echo September 2023 showed EF > 50%, no RWMA, mild LVH, normal diastolic parameters, normal RV size/function, no significant valvular abnormalities.  Patient*** Euvolemic and well compensated on exam. -Continue carvedilol, hydrochlorothiazide, lisinopril.  Hypertension BP today*** -Continue amlodipine, carvedilol, lisinopril.   ROS: All other systems reviewed and are otherwise negative except as noted in History of Present Illness.  Studies Reviewed       ***  Risk Assessment/Calculations    {Does this patient have ATRIAL FIBRILLATION?:[phone]} No BP recorded.  {Refresh Note OR Click here to enter BP  :1}***        Physical Exam    VS:  There were no vitals taken for this visit. , BMI There is no height or weight on file to  calculate BMI.  GEN: Well nourished, well developed, in no acute distress. Neck: No JVD or carotid bruits. Cardiac: *** RRR. No murmurs. No rubs or gallops.   Respiratory:  Respirations regular and unlabored. Clear to auscultation without rales, wheezing or rhonchi. GI: Soft, nontender, nondistended. Extremities: Radials/DP/PT 2+ and equal bilaterally. No clubbing or cyanosis. No edema ***  Skin: Warm and dry, no rash. Neuro: Strength intact.  Assessment & Plan   ***  Disposition: ***     {Are you ordering a CV Procedure (e.g. stress test, cath, DCCV, TEE, etc)?   Press F2        :[id number]}   Signed, Etta Grandchild. Posey Jasmin, DNP, NP-C

## 2023-10-31 ENCOUNTER — Other Ambulatory Visit: Payer: Self-pay | Admitting: Dermatology

## 2023-10-31 ENCOUNTER — Ambulatory Visit: Payer: Medicare Other | Admitting: Student

## 2023-10-31 DIAGNOSIS — K130 Diseases of lips: Secondary | ICD-10-CM

## 2023-10-31 DIAGNOSIS — L930 Discoid lupus erythematosus: Secondary | ICD-10-CM

## 2023-10-31 DIAGNOSIS — L700 Acne vulgaris: Secondary | ICD-10-CM

## 2023-11-03 DIAGNOSIS — J0140 Acute pansinusitis, unspecified: Secondary | ICD-10-CM | POA: Diagnosis not present

## 2023-11-11 ENCOUNTER — Other Ambulatory Visit: Payer: Self-pay | Admitting: Dermatology

## 2023-11-14 DIAGNOSIS — G894 Chronic pain syndrome: Secondary | ICD-10-CM | POA: Diagnosis not present

## 2023-11-14 DIAGNOSIS — Z6839 Body mass index (BMI) 39.0-39.9, adult: Secondary | ICD-10-CM | POA: Diagnosis not present

## 2023-11-14 DIAGNOSIS — I10 Essential (primary) hypertension: Secondary | ICD-10-CM | POA: Diagnosis not present

## 2023-11-14 DIAGNOSIS — Z79891 Long term (current) use of opiate analgesic: Secondary | ICD-10-CM | POA: Diagnosis not present

## 2023-11-19 ENCOUNTER — Encounter: Payer: Self-pay | Admitting: Medical

## 2023-11-19 ENCOUNTER — Ambulatory Visit: Payer: Medicare Other | Attending: Medical | Admitting: Medical

## 2023-11-19 VITALS — BP 130/78 | HR 94 | Ht 66.0 in | Wt 223.0 lb

## 2023-11-19 DIAGNOSIS — R079 Chest pain, unspecified: Secondary | ICD-10-CM

## 2023-11-19 DIAGNOSIS — I10 Essential (primary) hypertension: Secondary | ICD-10-CM | POA: Diagnosis not present

## 2023-11-19 DIAGNOSIS — R000 Tachycardia, unspecified: Secondary | ICD-10-CM

## 2023-11-19 MED ORDER — LISINOPRIL 5 MG PO TABS: 5.0000 mg | ORAL_TABLET | Freq: Every day | ORAL | 3 refills | Status: DC

## 2023-11-19 MED ORDER — AMLODIPINE BESYLATE 5 MG PO TABS: ORAL_TABLET | ORAL | 3 refills | Status: DC

## 2023-11-19 MED ORDER — HYDROCHLOROTHIAZIDE 12.5 MG PO TABS: 12.5000 mg | ORAL_TABLET | Freq: Every day | ORAL | 3 refills | Status: DC

## 2023-11-19 MED ORDER — CARVEDILOL 25 MG PO TABS: ORAL_TABLET | ORAL | 3 refills | Status: DC

## 2023-11-19 MED ORDER — METOPROLOL TARTRATE 100 MG PO TABS: 100.0000 mg | ORAL_TABLET | Freq: Once | ORAL | 0 refills | Status: DC

## 2023-11-19 NOTE — Patient Instructions (Signed)
 Medication Instructions:  - No changes *If you need a refill on your cardiac medications before your next appointment, please call your pharmacy*   Lab Work: Your provider would like for you to have following labs drawn today BMET.   If you have labs (blood work) drawn today and your tests are completely normal, you will receive your results only by: MyChart Message (if you have MyChart) OR A paper copy in the mail If you have any lab test that is abnormal or we need to change your treatment, we will call you to review the results.   Testing/Procedures: Your physician has requested that you have an echocardiogram. Echocardiography is a painless test that uses sound waves to create images of your heart. It provides your doctor with information about the size and shape of your heart and how well your heart's chambers and valves are working. This procedure takes approximately one hour. There are no restrictions for this procedure. Please do NOT wear cologne, perfume, aftershave, or lotions (deodorant is allowed). Please arrive 15 minutes prior to your appointment time.  Please note: We ask at that you not bring children with you during ultrasound (echo/ vascular) testing. Due to room size and safety concerns, children are not allowed in the ultrasound rooms during exams. Our front office staff cannot provide observation of children in our lobby area while testing is being conducted. An adult accompanying a patient to their appointment will only be allowed in the ultrasound room at the discretion of the ultrasound technician under special circumstances. We apologize for any inconvenience.     Your cardiac CT will be scheduled at one of the below locations:   [facility] [address] [place], [place] [address] [phone]  OR   [facility] [address] [place], [place] [address] [phone]  If scheduled at  [facility] or [facility], please arrive 15 mins early for check-in and test prep.  There is spacious parking and easy access to the radiology department from the [facility] entrance. Please enter here and check-in with the desk attendant.   Please follow these instructions carefully (unless otherwise directed):  An IV will be required for this test and Nitroglycerin  will be given.   On the Night Before the Test: Be sure to drink plenty of water . Do not consume any caffeinated/decaffeinated beverages or chocolate 12 hours prior to your test. Do not take any antihistamines 12 hours prior to your test.  On the Day of the Test: Drink plenty of water  until 1 hour prior to the test. Do not eat any food 1 hour prior to test. You may take your regular medications prior to the test.  Take metoprolol  (Lopressor ) two hours prior to test. If you take Furosemide/Hydrochlorothiazide /Spironolactone /Chlorthalidone, please HOLD on the morning of the test. Patients who wear a continuous glucose monitor MUST remove the device prior to scanning. FEMALES- please wear underwire-free bra if available, avoid dresses & tight clothing      After the Test: Drink plenty of water . After receiving IV contrast, you may experience a mild flushed feeling. This is normal. On occasion, you may experience a mild rash up to 24 hours after the test. This is not dangerous. If this occurs, you can take Benadryl  25 mg and increase your fluid intake. If you experience trouble breathing, this can be serious. If it is severe call 911 IMMEDIATELY. If it is mild, please  call our office.  We will call to schedule your test 2-4 weeks out understanding that some insurance companies will need an authorization prior to the service being performed.   For more information and frequently asked questions, please visit our website : [email or web address]  For  non-scheduling related questions, please contact the cardiac imaging nurse navigator should you have any questions/concerns: Cardiac Imaging Nurse Navigators Direct Office Dial: [phone]   For scheduling needs, including cancellations and rescheduling, please call Brittany, [phone].   Follow-Up: At [facility], you and your health needs are our priority.  As part of our continuing mission to provide you with exceptional heart care, we have created designated Provider Care Teams.  These Care Teams include your primary Cardiologist (physician) and Advanced Practice Providers (APPs -  Physician Assistants and Nurse Practitioners) who all work together to provide you with the care you need, when you need it.  Your next appointment:   3 month(s)  Provider:   You may see Timothy Gollan, MD or one of the following Advanced Practice Providers on your designated Care Team:   Lonni Meager, NP Bernardino Bring, PA-C Cadence Franchester, PA-C Tylene Lunch, NP Barnie Hila, NP

## 2023-11-19 NOTE — Progress Notes (Signed)
 Cardiology Office Note:    Date:  11/19/2023   ID:  Morgan Kent, DOB [birth year], MRN [id number]  PCP:  Morgan Tinnie POUR, PA-C  [facility] Cardiologist:  Evalene Lunger, MD  [facility] Electrophysiologist:  None   Referring MD: Morgan Tinnie POUR, PA*   Chief Complaint: 1 year follow-up  History of Present Illness:    Morgan Kent is a 43 y.o. female with a hx of morbid obesity, lupus, PVCs, HTN, OSA on nasal CPAP who presents for 1 year follow-up.  Echo 07/2022 showed normal LVEF and normal valves.  The patient was last seen 10/2022 reported chronic DOE from deconditioning and weight.   Today, the patient reports occasional chest pain when she walks. Can't walk 5 minutes before she feels chest pressure in her chest. Also notes if BP goes up she has chest pain.  Also has chest pain and DOE with stress. She denies lower significant leg edema, orthopnea or pnd. She uses CPAP every night. She has lightheadedness and dizziness when she gets upset or frustrated. She is trying to walk more.   Past Medical History:  Diagnosis Date   Anxiety    Arthritis    rheumo possible   Chiari malformation    Depression    Dizziness    Headache(784.0)    History of kidney stones    Hypertension    Lupus    Lupus    Nerve damage    NECK   PONV (postoperative nausea and vomiting)    nausea only   Sleep apnea    TMJ arthritis     Past Surgical History:  Procedure Laterality Date   carpel tunnel  Left 2014   CRANIOTOMY  14,06   chiari   DENTAL SURGERY     HERNIA REPAIR     umbilical   PLACEMENT OF LUMBAR DRAIN N/A 07/07/2014   Procedure: PLACEMENT OF LUMBAR DRAIN AND CLOSURE OF CERVICAL INCISION;  Surgeon: Reyes Budge, MD;  Location: [facility];  Service: Neurosurgery;  Laterality: N/A;   SUBOCCIPITAL CRANIECTOMY CERVICAL LAMINECTOMY N/A 09/29/2013   Procedure: SUBOCCIPITAL CRANIECTOMY CERVICAL LAMINECTOMY/DURAPLASTY;  Surgeon: Reyes JONETTA Budge, MD;  Location: [facility]UR[facility];  Service: Neurosurgery;  Laterality: N/A;  posterior   SUBOCCIPITAL CRANIECTOMY CERVICAL LAMINECTOMY N/A 06/23/2014   Procedure: SUBOCCIPITAL CRANIECTOMY CERVICAL LAMINECTOMY/DURAPLASTY;  Surgeon: Reyes Budge, MD;  Location: [facility];  Service: Neurosurgery;  Laterality: N/A;  suboccipital craniectomy with cervical laminectomy and duraplasty    Current Medications: Current Meds  Medication Sig   albuterol  (VENTOLIN  HFA) 108 (90 Base) MCG/ACT inhaler Inhale 2 puffs into the lungs daily.   amoxicillin -clavulanate (AUGMENTIN ) 875-125 MG tablet Take 1 tablet by mouth 2 (two) times daily.   Budeson-Glycopyrrol-Formoterol (BREZTRI  AEROSPHERE) 160-9-4.8 MCG/ACT AERO INHALE 2 PUFFS BY MOUTH INTO THE LUNGS TWICE DAILY   cetirizine  (ZYRTEC ) 10 MG tablet Take 1 tablet (10 mg total) by mouth daily.   clobetasol  ointment (TEMOVATE ) 0.05 % Apply 1 Application topically as directed. qd up to 5 days a week to lupus on arms as needed for flares, avoid face, groin, axilla   clotrimazole  (MYCELEX ) 10 MG troche Take 1 tablet (10 mg total) by mouth daily. Suck on one troche daily to prevent thrush   Cyanocobalamin  (B-12 COMPLIANCE INJECTION) 1000 MCG/ML KIT Inject as directed.   cyclobenzaprine  (FLEXERIL ) 10 MG tablet Take 1 tablet (10 mg total) by mouth 3 (three) times daily as needed.   Dapsone  7.5 % GEL APPLY TOPICALLY TO THE  AFFECTED AREA EVERY DAY   diclofenac  Sodium (VOLTAREN ) 1 % GEL APPLY 2 GRAMS TOPICALLY TO THE AFFECTED AREA FOUR TIMES DAILY   FEROSUL 325 (65 Fe) MG tablet TAKE 1 TABLET BY MOUTH DAILY WITH BREAKFAST   fluticasone  (FLONASE ) 50 MCG/ACT nasal spray USE 1 SPRAY IN EACH NOSTRIL AT BEDTIME   folic acid  (FOLVITE ) 1 MG tablet Take 1 mg by mouth daily.   gabapentin  (NEURONTIN ) 300 MG capsule TAKE 1 CAPSULE(300 MG) BY MOUTH THREE TIMES DAILY   hydroxychloroquine  (PLAQUENIL ) 200 MG tablet Take 1 tablet (200 mg total) by mouth daily.   ketoconazole  (NIZORAL ) 2 % shampoo Apply  topically once a week.   meloxicam  (MOBIC ) 7.5 MG tablet TAKE 1 TABLET(7.5 MG) BY MOUTH TWICE DAILY   methotrexate (RHEUMATREX) 2.5 MG tablet Take 2.5 mg by mouth once a week. Caution:Chemotherapy. Protect from light.   metoprolol  tartrate (LOPRESSOR ) 100 MG tablet Take 1 tablet (100 mg total) by mouth once for 1 dose. 2 hours prior to the CT   mometasone  (ELOCON ) 0.1 % lotion Apply topically as directed. Qd up to 5 days a week to affected area of scalp as needed for flares   mometasone  (ELOCON ) 0.1 % ointment APPLY TOPICALLY TO SORES ON FACE DAILY up to 5 days a week AS NEEDED FOR FLARES   montelukast  (SINGULAIR ) 10 MG tablet Take 1 tablet (10 mg total) by mouth at bedtime.   mupirocin  ointment (BACTROBAN ) 2 % Apply 1 Application topically daily. Qd to open sores face, body prn flares   NON FORMULARY CPAP with AHP   Norethindrone  Acetate-Ethinyl Estradiol (JUNEL 1.5/30) 1.5-30 MG-MCG tablet Take 1 tablet by mouth daily.   oxyCODONE -acetaminophen  (PERCOCET) 7.5-325 MG tablet Take 1 tablet by mouth every 4 (four) hours as needed for severe pain.   Polyethyl Glycol-Propyl Glycol (SYSTANE ULTRA) 0.4-0.3 % SOLN Place 1 drop into both eyes daily as needed (for dry eyes).    predniSONE  (STERAPRED UNI-PAK 21 TAB) 10 MG (21) TBPK tablet Use as directed for 6 days   tacrolimus  (PROGRAF ) 1 MG capsule OPEN 1 CAPSULE AND DISSOLVE INTO A HALF LITER OF WATER ( ), SWISH AROUND FOR 2 MINUTES THEN SPIT. DO TWICE DAILY   tacrolimus  (PROTOPIC ) 0.1 % ointment APPLY TOPICALLY TO THE AFFECTED AREA DAILY   triamcinolone  cream (KENALOG ) 0.1 % APPLY EXTERNALLY TO THE AFFECTED AREA TWICE DAILY AS NEEDED FOR UP TO 5 DAYS A WEEK   Vitamin D , Ergocalciferol , (DRISDOL ) 1.25 MG (50000 UNIT) CAPS capsule TAKE 1 CAPSULE BY MOUTH EVERY WEEK   [DISCONTINUED] amLODipine  (NORVASC ) 5 MG tablet TAKE 1 TABLET(5 MG) BY MOUTH DAILY   [DISCONTINUED] carvedilol  (COREG ) 25 MG tablet TAKE 1 TABLET(25 MG) BY MOUTH TWICE DAILY/PLEASE CALL  OFFICE TO SCHEDULE APPOINTMENT PRIOR TO NEXT REFILL   [DISCONTINUED] hydrochlorothiazide  (HYDRODIURIL ) 12.5 MG tablet TAKE 1 TABLET(12.5 MG) BY MOUTH DAILY   [DISCONTINUED] lisinopril  (ZESTRIL ) 5 MG tablet TAKE 1 TABLET(5 MG) BY MOUTH DAILY     Allergies:   Misc. sulfonamide containing compounds, Other, Sulfa  antibiotics, and Cephalexin   Social History   Socioeconomic History   Marital status: Single    Spouse name: Not on file   Number of children: Not on file   Years of education: Not on file   Highest education level: Not on file  Occupational History   Not on file  Tobacco Use   Smoking status: Never   Smokeless tobacco: Never  Vaping Use   Vaping status: Never Used  Substance and Sexual Activity  Alcohol  use: No    Alcohol /week: 0.0 standard drinks of alcohol    Drug use: No   Sexual activity: Not on file  Other Topics Concern   Not on file  Social History Narrative   Lives with 3 children in 2 story home.  Unemployed.  Trying to get disability.  Education: high school.   Social Drivers of Corporate Investment Banker Strain: Not on file  Food Insecurity: Not on file  Transportation Needs: Not on file  Physical Activity: Not on file  Stress: Not on file  Social Connections: Not on file     Family History: The patient's family history includes Asthma in an other family member; Breast cancer (age of onset: 89 - 60) in an other family member; Depression in an other family member; Diabetes in an other family member; Heart disease in her father; Hypertension in her father and mother; Migraines in an other family member; Stroke in an other family member; Thyroid  disease in her mother.  ROS:   Please see the history of present illness.     All other systems reviewed and are negative.  EKGs/Labs/Other Studies Reviewed:    The following studies were reviewed today:  Echo 2023 Normal LVEF and normal valves  EKG:  EKG is ordered today.  The ekg ordered today  demonstrates NSR 94bpm, nonspecific ST changes  Recent Labs: 06/12/2023: TSH 0.742  Recent Lipid Panel    Component Value Date/Time   CHOL 219 (H) 06/12/2023 1158   TRIG 318 (H) 06/12/2023 1158   HDL 45 06/12/2023 1158   LDLCALC 119 (H) 06/12/2023 1158   Physical Exam:    VS:  BP 130/78 (BP Location: Left Arm, Patient Position: Sitting, Cuff Size: Normal)   Pulse 94   Ht 5' 6 (1.676 m)   Wt 223 lb (101.2 kg)   SpO2 98%   BMI 35.99 kg/m     Wt Readings from Last 3 Encounters:  11/19/23 223 lb (101.2 kg)  09/15/23 218 lb 6.4 oz (99.1 kg)  08/25/23 230 lb 6.4 oz (104.5 kg)     GEN:  Well nourished, well developed in no acute distress HEENT: Normal NECK: No JVD; No carotid bruits LYMPHATICS: No lymphadenopathy CARDIAC: RRR, no murmurs, rubs, gallops RESPIRATORY:  Clear to auscultation without rales, wheezing or rhonchi  ABDOMEN: Soft, non-tender, non-distended MUSCULOSKELETAL:  No edema; No deformity  SKIN: Warm and dry NEUROLOGIC:  Alert and oriented x 3 PSYCHIATRIC:  Normal affect   ASSESSMENT:    1. Chest pain, unspecified type   2. Tachycardia   3. Essential hypertension    PLAN:    In order of problems listed above:  Chest pain/DOE Patient reports intermittent exertional chest pain, she has DOE when she feels stressed.  EKG today shows normal sinus rhythm with no ischemic changes. Prior imaging of the chest has not noted cardiac calcifications.  Low suspicion symptoms are cardiac in origin, however I will order an echocardiogram and a cardiac CTA.  I recommended activity/exercise as tolerated.    Tachycardia/PVCs Patient denies palpitations.  Continue Coreg  25 twice daily  Hypertension Blood pressure well-controlled.  Continue amlodipine  5 mg daily, Coreg  25 mg twice daily, lisinopril  5 mg daily and hydrochlorothiazide  12.5 mg daily.  Disposition: Follow up in 3 month(s) with MD/APP     Signed, Birdena Kingma VEAR Fishman, PA-C  11/19/2023 2:51 PM    [facility]

## 2023-11-20 ENCOUNTER — Other Ambulatory Visit: Payer: Self-pay | Admitting: Medical

## 2023-11-25 ENCOUNTER — Other Ambulatory Visit: Payer: Self-pay | Admitting: Medical

## 2023-11-25 DIAGNOSIS — R079 Chest pain, unspecified: Secondary | ICD-10-CM

## 2023-11-25 DIAGNOSIS — R000 Tachycardia, unspecified: Secondary | ICD-10-CM

## 2023-11-25 DIAGNOSIS — R0609 Other forms of dyspnea: Secondary | ICD-10-CM

## 2023-11-25 DIAGNOSIS — I10 Essential (primary) hypertension: Secondary | ICD-10-CM

## 2023-11-26 ENCOUNTER — Telehealth (HOSPITAL_COMMUNITY): Payer: Self-pay | Admitting: *Deleted

## 2023-11-26 NOTE — Telephone Encounter (Signed)
 Reaching out to patient to offer assistance regarding upcoming cardiac imaging study; pt verbalizes understanding of appt date/time, parking situation and where to check in, pre-test NPO status and medications ordered, and verified current allergies; name and call back number provided for further questions should they arise Johney Frame RN Navigator Cardiac Imaging [facility] [phone] office [phone] cell

## 2023-11-27 ENCOUNTER — Other Ambulatory Visit (HOSPITAL_COMMUNITY): Payer: Self-pay | Admitting: *Deleted

## 2023-11-27 ENCOUNTER — Ambulatory Visit
Admission: RE | Admit: 2023-11-27 | Discharge: 2023-11-27 | Disposition: A | Discharge status: Home / Self Care | Payer: Medicare Other | Source: Ambulatory Visit | Attending: Medical | Admitting: Medical

## 2023-11-27 DIAGNOSIS — R0609 Other forms of dyspnea: Secondary | ICD-10-CM

## 2023-11-27 DIAGNOSIS — R000 Tachycardia, unspecified: Secondary | ICD-10-CM

## 2023-11-27 DIAGNOSIS — R079 Chest pain, unspecified: Secondary | ICD-10-CM

## 2023-11-27 DIAGNOSIS — I10 Essential (primary) hypertension: Secondary | ICD-10-CM

## 2023-11-27 MED ORDER — IVABRADINE HCL 7.5 MG PO TABS: 15.0000 mg | ORAL_TABLET | Freq: Once | ORAL | 0 refills | Status: AC

## 2023-11-27 MED ORDER — METOPROLOL TARTRATE 100 MG PO TABS: 100.0000 mg | ORAL_TABLET | Freq: Once | ORAL | 0 refills | Status: DC

## 2023-11-27 NOTE — Progress Notes (Signed)
Patient with HR 90. Dr. Myriam Forehand aware. Patient to be rescheduled at [facility]. Nurse navigators to call her with instructions and medications needed.

## 2023-12-04 ENCOUNTER — Other Ambulatory Visit: Payer: Medicare Other

## 2023-12-05 ENCOUNTER — Telehealth (HOSPITAL_COMMUNITY): Payer: Self-pay | Admitting: Emergency Medicine

## 2023-12-05 NOTE — Telephone Encounter (Signed)
Attempted to call patient regarding upcoming cardiac CT appointment. Left message on voicemail with name and callback number Rockwell Alexandria RN Navigator Cardiac Imaging [facility] [phone] Office [phone] Cell

## 2023-12-08 ENCOUNTER — Ambulatory Visit: Payer: Medicare Other

## 2023-12-11 DIAGNOSIS — I420 Dilated cardiomyopathy: Secondary | ICD-10-CM | POA: Diagnosis not present

## 2023-12-11 DIAGNOSIS — R079 Chest pain, unspecified: Secondary | ICD-10-CM | POA: Diagnosis not present

## 2023-12-11 DIAGNOSIS — I5022 Chronic systolic (congestive) heart failure: Secondary | ICD-10-CM | POA: Diagnosis not present

## 2023-12-12 ENCOUNTER — Telehealth (HOSPITAL_COMMUNITY): Payer: Self-pay | Admitting: Emergency Medicine

## 2023-12-12 LAB — BASIC METABOLIC PANEL
BUN/Creatinine Ratio: 10 (ref 9–23)
BUN: 11 mg/dL (ref 6–24)
CO2: 21 mmol/L (ref 20–29)
Calcium: 10.2 mg/dL (ref 8.7–10.2)
Chloride: 101 mmol/L (ref 96–106)
Creatinine, Ser: 1.15 mg/dL — ABNORMAL HIGH (ref 0.57–1.00)
Glucose: 112 mg/dL — ABNORMAL HIGH (ref 70–99)
Potassium: 5.1 mmol/L (ref 3.5–5.2)
Sodium: 142 mmol/L (ref 134–144)
eGFR: 61 mL/min/{1.73_m2} (ref >59)

## 2023-12-12 NOTE — Telephone Encounter (Signed)
 Attempted to call patient regarding upcoming cardiac CT appointment. Left message on voicemail with name and callback number Rockwell Alexandria RN Navigator Cardiac Imaging [facility] [phone] Office [phone] Cell

## 2023-12-15 ENCOUNTER — Ambulatory Visit
Admission: RE | Admit: 2023-12-15 | Discharge: 2023-12-15 | Disposition: A | Discharge status: Home / Self Care | Payer: Medicare Other | Source: Ambulatory Visit | Attending: Medical | Admitting: Medical

## 2023-12-15 DIAGNOSIS — R0609 Other forms of dyspnea: Secondary | ICD-10-CM | POA: Diagnosis not present

## 2023-12-15 DIAGNOSIS — R000 Tachycardia, unspecified: Secondary | ICD-10-CM | POA: Insufficient documentation

## 2023-12-15 DIAGNOSIS — R918 Other nonspecific abnormal finding of lung field: Secondary | ICD-10-CM | POA: Diagnosis not present

## 2023-12-15 DIAGNOSIS — I429 Cardiomyopathy, unspecified: Secondary | ICD-10-CM | POA: Insufficient documentation

## 2023-12-15 DIAGNOSIS — I10 Essential (primary) hypertension: Secondary | ICD-10-CM | POA: Diagnosis not present

## 2023-12-15 DIAGNOSIS — R079 Chest pain, unspecified: Secondary | ICD-10-CM | POA: Diagnosis not present

## 2023-12-15 DIAGNOSIS — D174 Benign lipomatous neoplasm of intrathoracic organs: Secondary | ICD-10-CM | POA: Insufficient documentation

## 2023-12-15 MED ORDER — NITROGLYCERIN 0.4 MG SL SUBL
0.8000 mg | SUBLINGUAL_TABLET | Freq: Once | SUBLINGUAL | Status: AC
Start: 2023-12-15 — End: 2023-12-15
  Administered 2023-12-15: 0.8 mg via SUBLINGUAL
  Filled 2023-12-15: qty 25

## 2023-12-15 MED ORDER — IOHEXOL 350 MG/ML SOLN
80.0000 mL | Freq: Once | INTRAVENOUS | Status: AC | PRN
  Administered 2023-12-15: 80 mL via INTRAVENOUS

## 2023-12-15 MED ORDER — DILTIAZEM HCL 25 MG/5ML IV SOLN
10.0000 mg | INTRAVENOUS | Status: DC | PRN
  Filled 2023-12-15: qty 5

## 2023-12-15 MED ORDER — NITROGLYCERIN 0.4 MG SL SUBL
SUBLINGUAL_TABLET | SUBLINGUAL | Status: AC
  Filled 2023-12-15: qty 2

## 2023-12-15 MED ORDER — METOPROLOL TARTRATE 5 MG/5ML IV SOLN
10.0000 mg | Freq: Once | INTRAVENOUS | Status: DC | PRN
  Filled 2023-12-15: qty 10

## 2023-12-15 NOTE — Progress Notes (Addendum)
 Pt tolerated exam without incident; vital signs within normal limits; pt denies lightheadedness or dizziness; encouraged to drink caffeine, eat meal; pt ambulatory to lobby steady gait noted

## 2023-12-16 DIAGNOSIS — G894 Chronic pain syndrome: Secondary | ICD-10-CM | POA: Diagnosis not present

## 2023-12-16 DIAGNOSIS — Z79891 Long term (current) use of opiate analgesic: Secondary | ICD-10-CM | POA: Diagnosis not present

## 2023-12-16 DIAGNOSIS — M79661 Pain in right lower leg: Secondary | ICD-10-CM | POA: Diagnosis not present

## 2023-12-16 DIAGNOSIS — M545 Low back pain, unspecified: Secondary | ICD-10-CM | POA: Diagnosis not present

## 2023-12-16 DIAGNOSIS — M25511 Pain in right shoulder: Secondary | ICD-10-CM | POA: Diagnosis not present

## 2023-12-17 ENCOUNTER — Ambulatory Visit: Payer: Medicare Other | Attending: Medical

## 2023-12-17 DIAGNOSIS — R079 Chest pain, unspecified: Secondary | ICD-10-CM | POA: Insufficient documentation

## 2023-12-18 LAB — ECHOCARDIOGRAM COMPLETE
AV Mean grad: 3 mm[Hg]
AV Peak grad: 6.1 mm[Hg]
Ao pk vel: 1.23 m/s
Area-P 1/2: 3.53 cm2
S' Lateral: 3.65 cm

## 2023-12-29 ENCOUNTER — Ambulatory Visit: Payer: Medicare Other | Admitting: Medical

## 2023-12-29 DIAGNOSIS — J069 Acute upper respiratory infection, unspecified: Secondary | ICD-10-CM | POA: Diagnosis not present

## 2023-12-29 DIAGNOSIS — B370 Candidal stomatitis: Secondary | ICD-10-CM | POA: Diagnosis not present

## 2023-12-29 NOTE — Progress Notes (Deleted)
  Cardiology Office Note:  .   Date:  12/29/2023  ID:  Morgan Kent, DOB Jan 07, 1981, MRN [id number] PCP: Alan Ripper  [facility] Providers Cardiologist:  Julien Nordmann, MD { Click to update primary MD,subspecialty MD or APP then REFRESH:1}   History of Present Illness: .   Morgan Kent is a 43 y.o. female morbid obesity, lupus, PVCs, HTN, OSA on nasal CPAP who presents for 1 year follow-up.   Echo 07/2022 showed normal LVEF and normal valves.  Patient was last seen on 07/05/2019 chest pain with ambulation.  Cardiac CTA showed coronary calcium score of 0, CAD.  Echo showed mildly reduced pump function of 45 to 50% and normal LVSF, mild MR.  Today,  ROS: ***  Studies Reviewed: .        *** Risk Assessment/Calculations:   {Does this patient have ATRIAL FIBRILLATION?:[phone]} No BP recorded.  {Refresh Note OR Click here to enter BP  :1}***       Physical Exam:   VS:  There were no vitals taken for this visit.   Wt Readings from Last 3 Encounters:  11/19/23 223 lb (101.2 kg)  09/15/23 218 lb 6.4 oz (99.1 kg)  08/25/23 230 lb 6.4 oz (104.5 kg)    GEN: Well nourished, well developed in no acute distress NECK: No JVD; No carotid bruits CARDIAC: ***RRR, no murmurs, rubs, gallops RESPIRATORY:  Clear to auscultation without rales, wheezing or rhonchi  ABDOMEN: Soft, non-tender, non-distended EXTREMITIES:  No edema; No deformity   ASSESSMENT AND PLAN: .   ***    {Are you ordering a CV Procedure (e.g. stress test, cath, DCCV, TEE, etc)?   Press F2        :[id number]}  Dispo: ***  Signed, Ceanna Wareing David Stall, PA-C

## 2023-12-31 ENCOUNTER — Ambulatory Visit: Payer: Medicare Other | Attending: Medical | Admitting: Medical

## 2023-12-31 ENCOUNTER — Encounter: Payer: Self-pay | Admitting: Medical

## 2023-12-31 VITALS — BP 130/72 | HR 86 | Ht 66.0 in | Wt 215.8 lb

## 2023-12-31 DIAGNOSIS — I10 Essential (primary) hypertension: Secondary | ICD-10-CM | POA: Diagnosis not present

## 2023-12-31 DIAGNOSIS — Z79899 Other long term (current) drug therapy: Secondary | ICD-10-CM | POA: Diagnosis not present

## 2023-12-31 DIAGNOSIS — I5022 Chronic systolic (congestive) heart failure: Secondary | ICD-10-CM | POA: Diagnosis not present

## 2023-12-31 MED ORDER — EMPAGLIFLOZIN 10 MG PO TABS: 10.0000 mg | ORAL_TABLET | Freq: Every day | ORAL | 3 refills | Status: DC

## 2023-12-31 NOTE — Patient Instructions (Signed)
 Medication Instructions:  Your physician recommends the following medication changes.  STOP TAKING: hydrochlorothiazide  START TAKING: Jardiance 10 MG daily   *If you need a refill on your cardiac medications before your next appointment, please call your pharmacy*   Lab Work: Your provider would like for you to have following labs drawn today hgb and hct.   If you have labs (blood work) drawn today and your tests are completely normal, you will receive your results only by: MyChart Message (if you have MyChart) OR A paper copy in the mail If you have any lab test that is abnormal or we need to change your treatment, we will call you to review the results.  Your provider would like for you to return in 2 weeks to have the following labs drawn: BMP.   Please go to [facility] [address] ([facility] [address], [place] [address] You do not need an appointment.  They are open from 8 am- 4:30 pm.  Lunch from 1:00 pm- 2:00 pm You will not need to be fasting.   You may also go to one of the following [facility]:  [address] [place], [place] [address] Phone: [phone] Lab hours: Mon-Fri 8 am- 5 pm    Lunch 12 pm- 1 pm  [address] [place],  [place]  [address]  [place] Phone: [phone] Lab hours: 7 am- 4 pm Lunch 12 pm-1 pm   [address] [place],  [place]  [address]  [place] Phone: [phone] Lab hours: Mon-Fri 8 am- 5 pm    Lunch 12 pm- 1 pm    Testing/Procedures:     [facility] [address] [place], [place] [address] [phone] Please take advantage of the free valet parking available at the MAIN entrance (A entrance).  Proceed to the [facility] Radiology Department (First Floor) for check-in.   OR   [facility] [address] [place], [place] [address] [phone] Please take advantage of the free valet parking available at the MAIN entrance. Proceed to [facility] The registration for check-in  (first floor).  Magnetic resonance imaging (MRI) is a painless test that produces images of the inside of the body without using Xrays.  During an MRI, strong magnets and radio waves work together in a Data processing manager to form detailed images.   MRI images may provide more details about a medical condition than X-rays, CT scans, and ultrasounds can provide.  You may be given earphones to listen for instructions.  You may eat a light breakfast and take medications as ordered with the exception of HCTZ (fluid pill, other). Please avoid stimulants for 12 hr prior to test. (Ie. Caffeine, nicotine, chocolate, or antihistamine medications)  If a contrast material will be used, an IV will be inserted into one of your veins. Contrast material will be injected into your IV. It will leave your body through your urine within a day. You may be told to drink plenty of fluids to help flush the contrast material out of your system.  You will be asked to remove all metal, including: Watch, jewelry, and other metal objects including hearing aids, hair pieces and dentures. Also wearable glucose monitoring systems (ie. Freestyle Libre and Omnipods) (Braces and fillings normally are not a problem.)   TEST WILL TAKE APPROXIMATELY 1 HOUR  PLEASE NOTIFY SCHEDULING AT LEAST 24 HOURS IN ADVANCE IF YOU ARE UNABLE TO KEEP YOUR APPOINTMENT. [phone]  Please call Rockwell Alexandria, cardiac imaging nurse navigator  with any questions/concerns. Rockwell Alexandria RN Navigator Cardiac Imaging Larey Brick RN Navigator Cardiac Imaging [facility] [phone] Office     Follow-Up: At [facility], you and your health needs are our priority.  As part of our continuing mission to provide you with exceptional heart care, we have created designated Provider Care Teams.  These Care Teams include your primary Cardiologist (physician) and Advanced Practice Providers (APPs -  Physician Assistants  and Nurse Practitioners) who all work together to provide you with the care you need, when you need it.  We recommend signing up for the patient portal called "MyChart".  Sign up information is provided on this After Visit Summary.  MyChart is used to connect with patients for Virtual Visits (Telemedicine).  Patients are able to view lab/test results, encounter notes, upcoming appointments, etc.  Non-urgent messages can be sent to your provider as well.   To learn more about what you can do with MyChart, go to [email or web address].    Your next appointment:   6 week(s)  Provider:   Terrilee Croak, PA-C

## 2023-12-31 NOTE — Progress Notes (Signed)
 Cardiology Office Note:  .   Date:  12/31/2023  ID:  BLEN RANSOME, DOB 1981-06-17, MRN [id number] PCP: Alan Ripper  [facility] Providers Cardiologist:  Julien Nordmann, MD     History of Present Illness: .   Morgan Kent is a 43 y.o. female with a hx of morbid obesity, lupus, PVCs, HTN, OSA on nasal CPAP who presents for 1 year follow-up.   Echo 07/2022 showed normal LVEF and normal valves.The patient was last seen 11/19/23 reporting chest pain and DOE. Echo showed mildly reduced LVEF 45-50%, no WMA, normal RVSF, mild MR. Cardiac CTA showed no CAD.   Today, the patient reports she is overall OK. She feels she was having trouble swallowing and was diagnosed with thrush. She denies chest pain and DOE. No lower leg edema.   Studies Reviewed: Marland Kitchen   EKG Interpretation Date/Time:  Wednesday December 31 2023 11:03:59 EST Ventricular Rate:  86 PR Interval:  164 QRS Duration:  78 QT Interval:  370 QTC Calculation: 442 R Axis:   26  Text Interpretation: Normal sinus rhythm Nonspecific T wave abnormality When compared with ECG of 19-Nov-2023 13:41, No significant change was found Confirmed by Fransico Michael, Maciah Schweigert ([id number]) on 12/31/2023 11:20:35 AM    Echo 12/2023 1. Left ventricular ejection fraction, by estimation, is 45 to 50%. The  left ventricle has mildly decreased function. The left ventricle has no  regional wall motion abnormalities. Left ventricular diastolic parameters  are indeterminate. The average left   ventricular global longitudinal strain is -14.1 %. The global  longitudinal strain is abnormal.   2. Right ventricular systolic function is normal. The right ventricular  size is normal.   3. The mitral valve is normal in structure. Mild mitral valve  regurgitation. No evidence of mitral stenosis.   4. The aortic valve is normal in structure. Aortic valve regurgitation is  not visualized. No aortic stenosis is present.   5. The inferior vena cava is normal in size  with greater than 50%  respiratory variability, suggesting right atrial pressure of 3 mmHg.    Cardiac CTA 12/2023   IMPRESSION: 1. Coronary calcium score of 0.   2. Normal coronary origin with right dominance.   3. No evidence of CAD.   4. CAD-RADS 0. Consider non-atherosclerotic causes of chest pain.    Physical Exam:   VS:  BP 130/72 (BP Location: Left Arm, Patient Position: Sitting, Cuff Size: Normal)   Pulse 86   Ht 5\' 6"  (1.676 m)   Wt 215 lb 12.8 oz (97.9 kg)   SpO2 97%   BMI 34.83 kg/m    Wt Readings from Last 3 Encounters:  12/31/23 215 lb 12.8 oz (97.9 kg)  11/19/23 223 lb (101.2 kg)  09/15/23 218 lb 6.4 oz (99.1 kg)    GEN: Well nourished, well developed in no acute distress NECK: No JVD; No carotid bruits CARDIAC: RRR, no murmurs, rubs, gallops RESPIRATORY:  Clear to auscultation without rales, wheezing or rhonchi  ABDOMEN: Soft, non-tender, non-distended EXTREMITIES:  No edema; No deformity   ASSESSMENT AND PLAN: .    HFmrEF Echo in 2023 showed normal LVEF and normal valves. Echo 12/2023 showed LVEF 45-50%, mild MR. Cardiac CTA showed no CAD. She is euvolemic on exam. I will order a cMRI. I will stop hydrochlorothiazide and start Jardiance 10mg  daily. BMET in 2 weeks. Continue coreg, lisinopril, amlodipine. We will continue GDMT at follow-up. Unsure if medications for Lupus may contribute to CM.  HTN BP is good, continue amlodipine 5mg  daily, Coreg 25mg  BID, lisinopril 5mg  daily. Stop hydrochlorothiazide and start Jardiance.   Dispo: follow-up in 6 weeks  Signed, Stpehen Petitjean David Stall, PA-C

## 2024-01-01 LAB — HEMOGLOBIN AND HEMATOCRIT, BLOOD
Hematocrit: 35.5 % (ref 34.0–46.6)
Hemoglobin: 11.3 g/dL (ref 11.1–15.9)

## 2024-01-16 DIAGNOSIS — Z79891 Long term (current) use of opiate analgesic: Secondary | ICD-10-CM | POA: Diagnosis not present

## 2024-01-16 DIAGNOSIS — M79662 Pain in left lower leg: Secondary | ICD-10-CM | POA: Diagnosis not present

## 2024-01-16 DIAGNOSIS — M5136 Other intervertebral disc degeneration, lumbar region with discogenic back pain only: Secondary | ICD-10-CM | POA: Diagnosis not present

## 2024-01-16 DIAGNOSIS — G894 Chronic pain syndrome: Secondary | ICD-10-CM | POA: Diagnosis not present

## 2024-01-17 ENCOUNTER — Other Ambulatory Visit: Payer: Self-pay | Admitting: Cardiovascular Disease

## 2024-01-17 ENCOUNTER — Other Ambulatory Visit: Payer: Self-pay | Admitting: Dermatology

## 2024-01-17 DIAGNOSIS — K130 Diseases of lips: Secondary | ICD-10-CM

## 2024-01-19 ENCOUNTER — Encounter: Payer: Self-pay | Admitting: Physician Assistant

## 2024-01-19 ENCOUNTER — Ambulatory Visit (INDEPENDENT_AMBULATORY_CARE_PROVIDER_SITE_OTHER): Payer: Medicare Other | Admitting: Physician Assistant

## 2024-01-19 VITALS — BP 120/78 | HR 100 | Temp 97.8°F | Resp 16 | Ht 66.0 in | Wt 214.0 lb

## 2024-01-19 DIAGNOSIS — I10 Essential (primary) hypertension: Secondary | ICD-10-CM | POA: Diagnosis not present

## 2024-01-19 DIAGNOSIS — E559 Vitamin D deficiency, unspecified: Secondary | ICD-10-CM | POA: Diagnosis not present

## 2024-01-19 DIAGNOSIS — Z3041 Encounter for surveillance of contraceptive pills: Secondary | ICD-10-CM | POA: Diagnosis not present

## 2024-01-19 DIAGNOSIS — D509 Iron deficiency anemia, unspecified: Secondary | ICD-10-CM

## 2024-01-19 DIAGNOSIS — Z124 Encounter for screening for malignant neoplasm of cervix: Secondary | ICD-10-CM

## 2024-01-19 DIAGNOSIS — R5383 Other fatigue: Secondary | ICD-10-CM | POA: Diagnosis not present

## 2024-01-19 DIAGNOSIS — R946 Abnormal results of thyroid function studies: Secondary | ICD-10-CM

## 2024-01-19 DIAGNOSIS — Z3202 Encounter for pregnancy test, result negative: Secondary | ICD-10-CM

## 2024-01-19 DIAGNOSIS — Z113 Encounter for screening for infections with a predominantly sexual mode of transmission: Secondary | ICD-10-CM

## 2024-01-19 DIAGNOSIS — R875 Abnormal microbiological findings in specimens from female genital organs: Secondary | ICD-10-CM | POA: Diagnosis not present

## 2024-01-19 DIAGNOSIS — Z79899 Other long term (current) drug therapy: Secondary | ICD-10-CM | POA: Diagnosis not present

## 2024-01-19 DIAGNOSIS — E538 Deficiency of other specified B group vitamins: Secondary | ICD-10-CM | POA: Diagnosis not present

## 2024-01-19 DIAGNOSIS — R87610 Atypical squamous cells of undetermined significance on cytologic smear of cervix (ASC-US): Secondary | ICD-10-CM | POA: Diagnosis not present

## 2024-01-19 DIAGNOSIS — E782 Mixed hyperlipidemia: Secondary | ICD-10-CM

## 2024-01-19 LAB — POCT URINE PREGNANCY: Preg Test, Ur: NEGATIVE

## 2024-01-19 MED ORDER — NORETHIN ACE-ETH ESTRAD-FE 1.5-30 MG-MCG PO TABS: 1.0000 | ORAL_TABLET | Freq: Every day | ORAL | 11 refills | Status: DC

## 2024-01-19 NOTE — Progress Notes (Signed)
 [facility] [address] [place], [place] [address]  Internal MEDICINE  Office Visit Note  Patient Name: Morgan Kent  [id number]  [id number]  Date of Service: 01/19/2024   Chief Complaint  Patient presents with   Follow-up    pap   Hypertension   Depression     HPI Pt is here for a pap smear -still hasn't gotten birth control, has been without it for awhile. States it helps with clots.  Cycles have been regular without it. May need alternative, but will need to check pregnancy test due to time without it -Cardiology started her on jardiance, and stopped hydrochlorothiazide--but she states she has not stopped it yet and will do so now and monitor BP -is taking diclofenac now -goes back to rheumatology this week -will order labs for CPE  Current Medication: Outpatient Encounter Medications as of 01/19/2024  Medication Sig   albuterol (VENTOLIN HFA) 108 (90 Base) MCG/ACT inhaler Inhale 2 puffs into the lungs daily.   amLODipine (NORVASC) 5 MG tablet TAKE 1 TABLET(5 MG) BY MOUTH DAILY   Budeson-Glycopyrrol-Formoterol (BREZTRI AEROSPHERE) 160-9-4.8 MCG/ACT AERO INHALE 2 PUFFS BY MOUTH INTO THE LUNGS TWICE DAILY   carvedilol (COREG) 25 MG tablet TAKE 1 TABLET(25 MG) BY MOUTH TWICE DAILY   cetirizine (ZYRTEC) 10 MG tablet Take 1 tablet (10 mg total) by mouth daily.   clobetasol ointment (TEMOVATE) 0.05 % Apply 1 Application topically as directed. qd up to 5 days a week to lupus on arms as needed for flares, avoid face, groin, axilla   clotrimazole (MYCELEX) 10 MG troche Take 1 tablet (10 mg total) by mouth daily. Suck on one troche daily to prevent thrush   Cyanocobalamin (B-12 COMPLIANCE INJECTION) 1000 MCG/ML KIT Inject as directed.   cyclobenzaprine (FLEXERIL) 10 MG tablet Take 1 tablet (10 mg total) by mouth 3 (three) times daily as needed.   Dapsone 7.5 % GEL APPLY TOPICALLY TO THE AFFECTED AREA EVERY DAY   diclofenac (VOLTAREN) 50 MG EC tablet Take 50 mg by  mouth 2 (two) times daily.   diclofenac Sodium (VOLTAREN) 1 % GEL APPLY 2 GRAMS TOPICALLY TO THE AFFECTED AREA FOUR TIMES DAILY   empagliflozin (JARDIANCE) 10 MG TABS tablet Take 1 tablet (10 mg total) by mouth daily before breakfast.   FEROSUL 325 (65 Fe) MG tablet TAKE 1 TABLET BY MOUTH DAILY WITH BREAKFAST   fluticasone (FLONASE) 50 MCG/ACT nasal spray USE 1 SPRAY IN EACH NOSTRIL AT BEDTIME   folic acid (FOLVITE) 1 MG tablet Take 1 mg by mouth daily.   gabapentin (NEURONTIN) 300 MG capsule TAKE 1 CAPSULE(300 MG) BY MOUTH THREE TIMES DAILY   hydroxychloroquine (PLAQUENIL) 200 MG tablet Take 1 tablet (200 mg total) by mouth daily.   ketoconazole (NIZORAL) 2 % shampoo Apply topically once a week.   lisinopril (ZESTRIL) 5 MG tablet Take 1 tablet (5 mg total) by mouth daily.   methotrexate (RHEUMATREX) 2.5 MG tablet Take 2.5 mg by mouth once a week. Caution:Chemotherapy. Protect from light.   mometasone (ELOCON) 0.1 % lotion Apply topically as directed. Qd up to 5 days a week to affected area of scalp as needed for flares   mometasone (ELOCON) 0.1 % ointment APPLY TOPICALLY TO SORES ON FACE DAILY up to 5 days a week AS NEEDED FOR FLARES   montelukast (SINGULAIR) 10 MG tablet Take 1 tablet (10 mg total) by mouth at bedtime.   mupirocin ointment (BACTROBAN) 2 % Apply 1 Application topically daily. Qd to  open sores face, body prn flares   NON FORMULARY CPAP with AHP   norethindrone-ethinyl estradiol-iron (AUROVELA FE 1.5/30) 1.5-30 MG-MCG tablet Take 1 tablet by mouth daily.   oxyCODONE-acetaminophen (PERCOCET) 7.5-325 MG tablet Take 1 tablet by mouth every 4 (four) hours as needed for severe pain.   Polyethyl Glycol-Propyl Glycol (SYSTANE ULTRA) 0.4-0.3 % SOLN Place 1 drop into both eyes daily as needed (for dry eyes).    tacrolimus (PROGRAF) 1 MG capsule OPEN 1 CAPSULE AND DISSOLVE INTO A HALF LITER OF WATER(500ML), SWISH AROUND FOR 2 MINUTES THEN SPIT. DO TWICE DAILY   tacrolimus (PROTOPIC) 0.1 %  ointment APPLY TOPICALLY TWICE DAILY TO CORNERS OF MOUTH AS NEEDED FOR FLARES.   triamcinolone cream (KENALOG) 0.1 % APPLY EXTERNALLY TO THE AFFECTED AREA TWICE DAILY AS NEEDED FOR UP TO 5 DAYS A WEEK   Vitamin D, Ergocalciferol, (DRISDOL) 1.25 MG (50000 UNIT) CAPS capsule TAKE 1 CAPSULE BY MOUTH EVERY WEEK   [DISCONTINUED] meloxicam (MOBIC) 7.5 MG tablet TAKE 1 TABLET(7.5 MG) BY MOUTH TWICE DAILY   [DISCONTINUED] Norethindrone Acetate-Ethinyl Estradiol (JUNEL 1.5/30) 1.5-30 MG-MCG tablet Take 1 tablet by mouth daily.   metoprolol tartrate (LOPRESSOR) 100 MG tablet Take 1 tablet (100 mg total) by mouth once for 1 dose. 2 hours prior to the CT   [DISCONTINUED] amoxicillin-clavulanate (AUGMENTIN) 875-125 MG tablet Take 1 tablet by mouth 2 (two) times daily.   No facility-administered encounter medications on file as of 01/19/2024.    Surgical History: Past Surgical History:  Procedure Laterality Date   carpel tunnel  Left 2014   CRANIOTOMY  14,06   chiari   DENTAL SURGERY     HERNIA REPAIR     umbilical   PLACEMENT OF LUMBAR DRAIN N/A 07/07/2014   Procedure: PLACEMENT OF LUMBAR DRAIN AND CLOSURE OF CERVICAL INCISION;  Surgeon: Tressie Stalker, MD;  Location: [facility];  Service: Neurosurgery;  Laterality: N/A;   SUBOCCIPITAL CRANIECTOMY CERVICAL LAMINECTOMY N/A 09/29/2013   Procedure: SUBOCCIPITAL CRANIECTOMY CERVICAL LAMINECTOMY/DURAPLASTY;  Surgeon: Cristi Loron, MD;  Location: [facility];  Service: Neurosurgery;  Laterality: N/A;  posterior   SUBOCCIPITAL CRANIECTOMY CERVICAL LAMINECTOMY N/A 06/23/2014   Procedure: SUBOCCIPITAL CRANIECTOMY CERVICAL LAMINECTOMY/DURAPLASTY;  Surgeon: Tressie Stalker, MD;  Location: [facility];  Service: Neurosurgery;  Laterality: N/A;  suboccipital craniectomy with cervical laminectomy and duraplasty    Medical History: Past Medical History:  Diagnosis Date   Anxiety    Arthritis    rheumo possible   Chiari malformation    Depression     Dizziness    Headache(784.0)    History of kidney stones    Hypertension    Lupus    Lupus    Nerve damage    NECK   PONV (postoperative nausea and vomiting)    nausea only   Sleep apnea    TMJ arthritis     Family History: Family History  Problem Relation Age of Onset   Hypertension Mother    Thyroid disease Mother    Heart disease Father    Hypertension Father    Breast cancer Other 48 - 91       maternal great grandmaw   Asthma Other    Depression Other    Diabetes Other    Migraines Other    Stroke Other     Social History   Socioeconomic History   Marital status: Single    Spouse name: Not on file   Number of children: Not on file  Years of education: Not on file   Highest education level: Not on file  Occupational History   Not on file  Tobacco Use   Smoking status: Never   Smokeless tobacco: Never  Vaping Use   Vaping status: Never Used  Substance and Sexual Activity   Alcohol use: No    Alcohol/week: 0.0 standard drinks of alcohol   Drug use: No   Sexual activity: Not on file  Other Topics Concern   Not on file  Social History Narrative   Lives with 3 children in 2 story home.  Unemployed.  Trying to get disability.  Education: high school.   Social Drivers of Corporate investment banker Strain: Not on file  Food Insecurity: Not on file  Transportation Needs: Not on file  Physical Activity: Not on file  Stress: Not on file  Social Connections: Not on file  Intimate Partner Violence: Not on file     Review of Systems  Constitutional:  Negative for chills and unexpected weight change.  HENT:  Negative for congestion, rhinorrhea, sneezing and sore throat.   Eyes:  Negative for redness.  Respiratory:  Negative for cough, chest tightness and shortness of breath.   Cardiovascular:  Negative for chest pain and palpitations.  Gastrointestinal:  Negative for abdominal pain, constipation, diarrhea, nausea and vomiting.  Genitourinary:  Negative  for dysuria and frequency.  Musculoskeletal:  Positive for arthralgias. Negative for back pain, joint swelling and neck pain.  Skin:  Negative for rash.  Neurological: Negative.  Negative for tremors and numbness.  Hematological:  Negative for adenopathy. Does not bruise/bleed easily.  Psychiatric/Behavioral:  Negative for behavioral problems (Depression), sleep disturbance and suicidal ideas. The patient is not nervous/anxious.      Vital signs: BP 120/78   Pulse 100   Temp 97.8 F (36.6 C)   Resp 16   Ht 5\' 6"  (1.676 m)   Wt 214 lb (97.1 kg)   SpO2 96%   BMI 34.54 kg/m    Physical Exam Vitals and nursing note reviewed.  Constitutional:      General: She is not in acute distress.    Appearance: Normal appearance. She is obese. She is not ill-appearing.  HENT:     Head: Normocephalic and atraumatic.  Eyes:     Pupils: Pupils are equal, round, and reactive to light.  Cardiovascular:     Rate and Rhythm: Normal rate and regular rhythm.     Heart sounds: Normal heart sounds. No murmur heard. Pulmonary:     Effort: Pulmonary effort is normal. No respiratory distress.     Breath sounds: Normal breath sounds. No wheezing.  Genitourinary:    Exam position: Lithotomy position.     Vagina: Vaginal discharge present.     Comments: Pap performed-discharge present and initially obstructing view Skin:    General: Skin is warm and dry.  Neurological:     Mental Status: She is alert and oriented to person, place, and time.  Psychiatric:        Mood and Affect: Mood normal.        Behavior: Behavior normal.       Assessment/Plan: 1. Essential hypertension (Primary) Well controlled, continue current medications  2. Routine cervical smear - IGP, Aptima HPV  3. Screen for STD (sexually transmitted disease) - NuSwab Vaginitis Plus (VG+)  4. Vitamin D deficiency - VITAMIN D 25 Hydroxy (Vit-D Deficiency, Fractures)  5. Iron deficiency anemia, unspecified iron deficiency  anemia type - Fe+TIBC+Fer  6. B12 deficiency - B12 and Folate Panel  7. Mixed hyperlipidemia - Lipid Panel With LDL/HDL Ratio  8. Abnormal thyroid exam - TSH + free T4  9. Other fatigue - CBC w/Diff/Platelet - Comprehensive metabolic panel - TSH + free T4 - B12 and Folate Panel - Lipid Panel With LDL/HDL Ratio - VITAMIN D 25 Hydroxy (Vit-D Deficiency, Fractures) - Fe+TIBC+Fer  10. Pregnancy test negative - POCT urine pregnancy  11. Encounter for surveillance of contraceptive pills Will try sending alternative due to insurance - norethindrone-ethinyl estradiol-iron (AUROVELA FE 1.5/30) 1.5-30 MG-MCG tablet; Take 1 tablet by mouth daily.  Dispense: 28 tablet; Refill: 11   General Counseling: Miyana verbalizes understanding of the findings of todays visit and agrees with plan of treatment. I have discussed any further diagnostic evaluation that may be needed or ordered today. We also reviewed her medications today. she has been encouraged to call the office with any questions or concerns that should arise related to todays visit.    Counseling:    Orders Placed This Encounter  Procedures   NuSwab Vaginitis Plus (VG+)   CBC w/Diff/Platelet   Comprehensive metabolic panel   TSH + free T4   B12 and Folate Panel   Lipid Panel With LDL/HDL Ratio   VITAMIN D 25 Hydroxy (Vit-D Deficiency, Fractures)   Fe+TIBC+Fer   POCT urine pregnancy    Meds ordered this encounter  Medications   norethindrone-ethinyl estradiol-iron (AUROVELA FE 1.5/30) 1.5-30 MG-MCG tablet    Sig: Take 1 tablet by mouth daily.    Dispense:  28 tablet    Refill:  11    Time spent:30 Minutes

## 2024-01-20 LAB — BASIC METABOLIC PANEL
BUN/Creatinine Ratio: 9 (ref 9–23)
BUN: 8 mg/dL (ref 6–24)
CO2: 25 mmol/L (ref 20–29)
Calcium: 9.5 mg/dL (ref 8.7–10.2)
Chloride: 100 mmol/L (ref 96–106)
Creatinine, Ser: 0.9 mg/dL (ref 0.57–1.00)
Glucose: 99 mg/dL (ref 70–99)
Potassium: 4.3 mmol/L (ref 3.5–5.2)
Sodium: 138 mmol/L (ref 134–144)
eGFR: 81 mL/min/{1.73_m2} (ref >59)

## 2024-01-23 DIAGNOSIS — Z79899 Other long term (current) drug therapy: Secondary | ICD-10-CM | POA: Diagnosis not present

## 2024-01-23 DIAGNOSIS — I7300 Raynaud's syndrome without gangrene: Secondary | ICD-10-CM | POA: Diagnosis not present

## 2024-01-23 DIAGNOSIS — M329 Systemic lupus erythematosus, unspecified: Secondary | ICD-10-CM | POA: Diagnosis not present

## 2024-01-23 DIAGNOSIS — L930 Discoid lupus erythematosus: Secondary | ICD-10-CM | POA: Diagnosis not present

## 2024-01-28 LAB — IGP, APTIMA HPV: HPV Aptima: NEGATIVE

## 2024-01-28 LAB — NUSWAB VAGINITIS PLUS (VG+)
Atopobium vaginae: HIGH {score} — AB
Candida albicans, NAA: POSITIVE — AB
Candida glabrata, NAA: NEGATIVE
Chlamydia trachomatis, NAA: NEGATIVE
Neisseria gonorrhoeae, NAA: NEGATIVE
Trich vag by NAA: NEGATIVE

## 2024-02-01 DIAGNOSIS — J0100 Acute maxillary sinusitis, unspecified: Secondary | ICD-10-CM | POA: Diagnosis not present

## 2024-02-01 DIAGNOSIS — J069 Acute upper respiratory infection, unspecified: Secondary | ICD-10-CM | POA: Diagnosis not present

## 2024-02-08 DIAGNOSIS — J069 Acute upper respiratory infection, unspecified: Secondary | ICD-10-CM | POA: Diagnosis not present

## 2024-02-10 ENCOUNTER — Telehealth: Payer: Self-pay

## 2024-02-10 ENCOUNTER — Other Ambulatory Visit: Payer: Self-pay | Admitting: Internal Medicine

## 2024-02-10 ENCOUNTER — Other Ambulatory Visit: Payer: Self-pay

## 2024-02-10 DIAGNOSIS — J4520 Mild intermittent asthma, uncomplicated: Secondary | ICD-10-CM

## 2024-02-10 MED ORDER — FLUCONAZOLE 150 MG PO TABS: 150.0000 mg | ORAL_TABLET | Freq: Once | ORAL | 0 refills | Status: AC

## 2024-02-10 NOTE — Telephone Encounter (Signed)
 Spoke with patient regarding pap results, sent 1 tab of Diflucan per Lauren.

## 2024-02-10 NOTE — Telephone Encounter (Signed)
-----   Message from Morgan Kent sent at 02/10/2024  1:08 PM EDT ----- Please let her know that her pap showed ASCUS--abnormal cells of unknown significance but she was negative for HPV which is reassuring. Some yeast was detected and can send a tab of diflucan.

## 2024-02-13 DIAGNOSIS — M79662 Pain in left lower leg: Secondary | ICD-10-CM | POA: Diagnosis not present

## 2024-02-13 DIAGNOSIS — M545 Low back pain, unspecified: Secondary | ICD-10-CM | POA: Diagnosis not present

## 2024-02-13 DIAGNOSIS — M25512 Pain in left shoulder: Secondary | ICD-10-CM | POA: Diagnosis not present

## 2024-02-13 DIAGNOSIS — Z79891 Long term (current) use of opiate analgesic: Secondary | ICD-10-CM | POA: Diagnosis not present

## 2024-02-13 DIAGNOSIS — G894 Chronic pain syndrome: Secondary | ICD-10-CM | POA: Diagnosis not present

## 2024-02-18 ENCOUNTER — Ambulatory Visit: Payer: Medicare Other | Admitting: Medical

## 2024-02-19 ENCOUNTER — Ambulatory Visit: Admitting: Medical

## 2024-02-23 ENCOUNTER — Encounter: Payer: Self-pay | Admitting: Nurse Practitioner

## 2024-02-23 ENCOUNTER — Ambulatory Visit (INDEPENDENT_AMBULATORY_CARE_PROVIDER_SITE_OTHER): Payer: Medicare Other | Admitting: Nurse Practitioner

## 2024-02-23 VITALS — BP 124/76 | HR 94 | Temp 97.1°F | Resp 16 | Ht 66.0 in | Wt 219.8 lb

## 2024-02-23 DIAGNOSIS — R0602 Shortness of breath: Secondary | ICD-10-CM

## 2024-02-23 DIAGNOSIS — J4541 Moderate persistent asthma with (acute) exacerbation: Secondary | ICD-10-CM | POA: Diagnosis not present

## 2024-02-23 DIAGNOSIS — Z7189 Other specified counseling: Secondary | ICD-10-CM | POA: Diagnosis not present

## 2024-02-23 DIAGNOSIS — J449 Chronic obstructive pulmonary disease, unspecified: Secondary | ICD-10-CM | POA: Diagnosis not present

## 2024-02-23 DIAGNOSIS — G4733 Obstructive sleep apnea (adult) (pediatric): Secondary | ICD-10-CM | POA: Diagnosis not present

## 2024-02-23 MED ORDER — BREZTRI AEROSPHERE 160-9-4.8 MCG/ACT IN AERO: INHALATION_SPRAY | RESPIRATORY_TRACT | 4 refills | Status: AC

## 2024-02-23 NOTE — Progress Notes (Signed)
 [facility] [address] [place], [place] [address]  Internal MEDICINE  Office Visit Note  Patient Name: Morgan Kent  [id number]  [id number]  Date of Service: 02/23/2024  Chief Complaint  Patient presents with   Follow-up    HPI Morgan presents for a follow-up visit for COPD, asthma, OSA on CPAP  Asthma and COPD -- PFT due in August, last PFT results was moderate obstructive and mild restrictive lung disease. No significant change in symptoms, reports intermittent SOB and cough, denies any chest tightness, chest pain or wheezing at this time.  OSA -- using every night -- difficult to use when sick but otherwise no issues. Not able to get cpap download but [employer] is working on fixing this. She has a different machine, Loetta Kent RRT said they will main her a card and she will help the office staff with a different website to be able to get her downloads. Patient is Tired  during the day but no headaches and she is not needing to take any naps during the day.     Current Medication: Outpatient Encounter Medications as of 02/23/2024  Medication Sig   albuterol (VENTOLIN HFA) 108 (90 Base) MCG/ACT inhaler INHALE 2 PUFFS INTO THE LUNGS DAILY   amLODipine (NORVASC) 5 MG tablet TAKE 1 TABLET(5 MG) BY MOUTH DAILY   carvedilol (COREG) 25 MG tablet TAKE 1 TABLET(25 MG) BY MOUTH TWICE DAILY   cetirizine (ZYRTEC) 10 MG tablet Take 1 tablet (10 mg total) by mouth daily.   clobetasol ointment (TEMOVATE) 0.05 % Apply 1 Application topically as directed. qd up to 5 days a week to lupus on arms as needed for flares, avoid face, groin, axilla   clotrimazole (MYCELEX) 10 MG troche Take 1 tablet (10 mg total) by mouth daily. Suck on one troche daily to prevent thrush   Cyanocobalamin (B-12 COMPLIANCE INJECTION) 1000 MCG/ML KIT Inject as directed.   cyclobenzaprine (FLEXERIL) 10 MG tablet Take 1 tablet (10 mg total) by mouth 3 (three) times daily as needed.   Dapsone 7.5 % GEL APPLY TOPICALLY TO  THE AFFECTED AREA EVERY DAY   diclofenac (VOLTAREN) 50 MG EC tablet Take 50 mg by mouth 2 (two) times daily.   diclofenac Sodium (VOLTAREN) 1 % GEL APPLY 2 GRAMS TOPICALLY TO THE AFFECTED AREA FOUR TIMES DAILY   empagliflozin (JARDIANCE) 10 MG TABS tablet Take 1 tablet (10 mg total) by mouth daily before breakfast.   FEROSUL 325 (65 Fe) MG tablet TAKE 1 TABLET BY MOUTH DAILY WITH BREAKFAST   fluticasone (FLONASE) 50 MCG/ACT nasal spray USE 1 SPRAY IN EACH NOSTRIL AT BEDTIME   folic acid (FOLVITE) 1 MG tablet Take 1 mg by mouth daily.   gabapentin (NEURONTIN) 300 MG capsule TAKE 1 CAPSULE(300 MG) BY MOUTH THREE TIMES DAILY   hydroxychloroquine (PLAQUENIL) 200 MG tablet Take 1 tablet (200 mg total) by mouth daily.   ketoconazole (NIZORAL) 2 % shampoo Apply topically once a week.   lisinopril (ZESTRIL) 5 MG tablet Take 1 tablet (5 mg total) by mouth daily.   methotrexate (RHEUMATREX) 2.5 MG tablet Take 2.5 mg by mouth once a week. Caution:Chemotherapy. Protect from light.   mometasone (ELOCON) 0.1 % lotion Apply topically as directed. Qd up to 5 days a week to affected area of scalp as needed for flares   mometasone (ELOCON) 0.1 % ointment APPLY TOPICALLY TO SORES ON FACE DAILY up to 5 days a week AS NEEDED FOR FLARES   montelukast (SINGULAIR) 10 MG  tablet Take 1 tablet (10 mg total) by mouth at bedtime.   mupirocin ointment (BACTROBAN) 2 % Apply 1 Application topically daily. Qd to open sores face, body prn flares   NON FORMULARY CPAP with [facility]   norethindrone-ethinyl estradiol-iron (AUROVELA FE 1.5/30) 1.5-30 MG-MCG tablet Take 1 tablet by mouth daily.   oxyCODONE-acetaminophen (PERCOCET) 7.5-325 MG tablet Take 1 tablet by mouth every 4 (four) hours as needed for severe pain.   Polyethyl Glycol-Propyl Glycol (SYSTANE ULTRA) 0.4-0.3 % SOLN Place 1 drop into both eyes daily as needed (for dry eyes).    tacrolimus (PROGRAF) 1 MG capsule OPEN 1 CAPSULE AND DISSOLVE INTO A HALF LITER OF WATER(500ML),  SWISH AROUND FOR 2 MINUTES THEN SPIT. DO TWICE DAILY   tacrolimus (PROTOPIC) 0.1 % ointment APPLY TOPICALLY TWICE DAILY TO CORNERS OF MOUTH AS NEEDED FOR FLARES.   triamcinolone cream (KENALOG) 0.1 % APPLY EXTERNALLY TO THE AFFECTED AREA TWICE DAILY AS NEEDED FOR UP TO 5 DAYS A WEEK   Vitamin D, Ergocalciferol, (DRISDOL) 1.25 MG (50000 UNIT) CAPS capsule TAKE 1 CAPSULE BY MOUTH EVERY WEEK   [DISCONTINUED] Budeson-Glycopyrrol-Formoterol (BREZTRI AEROSPHERE) 160-9-4.8 MCG/ACT AERO INHALE 2 PUFFS BY MOUTH INTO THE LUNGS TWICE DAILY   budeson-glycopyrrolate-formoterol (BREZTRI AEROSPHERE) 160-9-4.8 MCG/ACT AERO inhaler INHALE 2 PUFFS BY MOUTH INTO THE LUNGS TWICE DAILY   metoprolol tartrate (LOPRESSOR) 100 MG tablet Take 1 tablet (100 mg total) by mouth once for 1 dose. 2 hours prior to the CT   No facility-administered encounter medications on file as of 02/23/2024.    Surgical History: Past Surgical History:  Procedure Laterality Date   carpel tunnel  Left 2014   CRANIOTOMY  14,06   chiari   DENTAL SURGERY     HERNIA REPAIR     umbilical   PLACEMENT OF LUMBAR DRAIN N/A 07/07/2014   Procedure: PLACEMENT OF LUMBAR DRAIN AND CLOSURE OF CERVICAL INCISION;  Surgeon: Garry Kansas, MD;  Location: [facility];  Service: Neurosurgery;  Laterality: N/A;   SUBOCCIPITAL CRANIECTOMY CERVICAL LAMINECTOMY N/A 09/29/2013   Procedure: SUBOCCIPITAL CRANIECTOMY CERVICAL LAMINECTOMY/DURAPLASTY;  Surgeon: Elder Greening, MD;  Location: [facility];  Service: Neurosurgery;  Laterality: N/A;  posterior   SUBOCCIPITAL CRANIECTOMY CERVICAL LAMINECTOMY N/A 06/23/2014   Procedure: SUBOCCIPITAL CRANIECTOMY CERVICAL LAMINECTOMY/DURAPLASTY;  Surgeon: Garry Kansas, MD;  Location: [facility];  Service: Neurosurgery;  Laterality: N/A;  suboccipital craniectomy with cervical laminectomy and duraplasty    Medical History: Past Medical History:  Diagnosis Date   Anxiety    Arthritis    rheumo possible   Chiari  malformation    Depression    Dizziness    Headache(784.0)    History of kidney stones    Hypertension    Lupus    Lupus    Nerve damage    NECK   PONV (postoperative nausea and vomiting)    nausea only   Sleep apnea    TMJ arthritis     Family History: Family History  Problem Relation Age of Onset   Hypertension Mother    Thyroid disease Mother    Heart disease Father    Hypertension Father    Breast cancer Other 41 - 48       maternal great grandmaw   Asthma Other    Depression Other    Diabetes Other    Migraines Other    Stroke Other     Social History   Socioeconomic History   Marital status: Single    Spouse  name: Not on file   Number of children: Not on file   Years of education: Not on file   Highest education level: Not on file  Occupational History   Not on file  Tobacco Use   Smoking status: Never   Smokeless tobacco: Never  Vaping Use   Vaping status: Never Used  Substance and Sexual Activity   Alcohol use: No    Alcohol/week: 0.0 standard drinks of alcohol   Drug use: No   Sexual activity: Not on file  Other Topics Concern   Not on file  Social History Narrative   Lives with 3 children in 2 story home.  Unemployed.  Trying to get disability.  Education: high school.   Social Drivers of Corporate investment banker Strain: Not on file  Food Insecurity: Not on file  Transportation Needs: Not on file  Physical Activity: Not on file  Stress: Not on file  Social Connections: Not on file  Intimate Partner Violence: Not on file      Review of Systems  Constitutional:  Positive for fatigue. Negative for chills and unexpected weight change.  HENT:  Positive for postnasal drip. Negative for congestion, rhinorrhea, sneezing and sore throat.   Respiratory:  Positive for cough (intermittent) and shortness of breath (intermittent). Negative for chest tightness and wheezing.   Cardiovascular:  Negative for chest pain and palpitations.   Gastrointestinal:  Negative for abdominal pain, constipation, diarrhea, nausea and vomiting.  Genitourinary:  Negative for dysuria and frequency.  Musculoskeletal:  Negative for arthralgias, back pain, joint swelling and neck pain.  Skin:  Negative for rash.  Neurological: Negative.   Psychiatric/Behavioral:  Negative for behavioral problems (Depression), self-injury, sleep disturbance and suicidal ideas. The patient is not nervous/anxious.     Vital Signs: BP 124/76   Pulse 94   Temp (!) 97.1 F (36.2 C)   Resp 16   Ht 5\' 6"  (1.676 m)   Wt 219 lb 12.8 oz (99.7 kg)   SpO2 96%   BMI 35.48 kg/m    Physical Exam Vitals reviewed.  Constitutional:      General: She is not in acute distress.    Appearance: Normal appearance. She is obese. She is not ill-appearing.  HENT:     Head: Normocephalic and atraumatic.  Eyes:     Pupils: Pupils are equal, round, and reactive to light.  Cardiovascular:     Rate and Rhythm: Normal rate and regular rhythm.     Heart sounds: Normal heart sounds. No murmur heard. Pulmonary:     Effort: Pulmonary effort is normal. No respiratory distress.     Breath sounds: Normal breath sounds. No wheezing or rales.  Neurological:     Mental Status: She is alert and oriented to person, place, and time.  Psychiatric:        Mood and Affect: Mood normal.        Behavior: Behavior normal.        Assessment/Plan: 1. Moderate persistent asthma with acute exacerbation (Primary) Reorder breztri, patient had never started the medication. This would help with her SOB. PFT due in August.  - budeson-glycopyrrolate-formoterol (BREZTRI AEROSPHERE) 160-9-4.8 MCG/ACT AERO inhaler; INHALE 2 PUFFS BY MOUTH INTO THE LUNGS TWICE DAILY  Dispense: 32.1 g; Refill: 4 - Pulmonary function test; Future  2. Chronic obstructive pulmonary disease, unspecified COPD type (HCC) PFT due in August. Breztri prescribed.  - budeson-glycopyrrolate-formoterol (BREZTRI AEROSPHERE)  160-9-4.8 MCG/ACT AERO inhaler; INHALE 2 PUFFS BY MOUTH INTO THE  LUNGS TWICE DAILY  Dispense: 32.1 g; Refill: 4 - Pulmonary function test; Future  3. SOB (shortness of breath) PFT due in August  - Pulmonary function test; Future  4. OSA on CPAP Continue CPAP use as instructed. Patient will get a new data card for her cpap in the mail and will send the old one to [facility] for download  5. CPAP use counseling Continue cpap use and maintenance as instructed.    General Counseling: Jailee verbalizes understanding of the findings of todays visit and agrees with plan of treatment. I have discussed any further diagnostic evaluation that may be needed or ordered today. We also reviewed her medications today. she has been encouraged to call the office with any questions or concerns that should arise related to todays visit.    Orders Placed This Encounter  Procedures   Pulmonary function test    Meds ordered this encounter  Medications   budeson-glycopyrrolate-formoterol (BREZTRI AEROSPHERE) 160-9-4.8 MCG/ACT AERO inhaler    Sig: INHALE 2 PUFFS BY MOUTH INTO THE LUNGS TWICE DAILY    Dispense:  32.1 g    Refill:  4    **Patient requests 90 days supply**    Return in about 6 months (around 08/24/2024) for F/U, pulmonary/sleep, PFT @ [facility], Janeice Medal PCP, need pft inlate august .   Total time spent:30 Minutes Time spent includes review of chart, medications, test results, and follow up plan with the patient.    Controlled Substance Database was reviewed by me.  This patient was seen by Laurence Pons, FNP-C in collaboration with Dr. Verneta Gone as a part of collaborative care agreement.   Khole Branch R. Bobbi Burow, MSN, FNP-C Internal medicine

## 2024-03-04 ENCOUNTER — Encounter: Payer: Self-pay | Admitting: Medical

## 2024-03-04 ENCOUNTER — Ambulatory Visit: Attending: Medical | Admitting: Medical

## 2024-03-04 VITALS — BP 128/84 | HR 85 | Ht 66.0 in | Wt 220.0 lb

## 2024-03-04 DIAGNOSIS — I5022 Chronic systolic (congestive) heart failure: Secondary | ICD-10-CM | POA: Insufficient documentation

## 2024-03-04 DIAGNOSIS — Z79899 Other long term (current) drug therapy: Secondary | ICD-10-CM | POA: Diagnosis not present

## 2024-03-04 DIAGNOSIS — I10 Essential (primary) hypertension: Secondary | ICD-10-CM | POA: Insufficient documentation

## 2024-03-04 MED ORDER — ENTRESTO 24-26 MG PO TABS: 1.0000 | ORAL_TABLET | Freq: Two times a day (BID) | ORAL | 3 refills | Status: DC

## 2024-03-04 NOTE — Patient Instructions (Signed)
 Medication Instructions:  Your physician recommends the following medication changes.  STOP TAKING: Lisinopril  and Amlodipine   START TAKING: Entresto  24/26mg  two times daily Wait until 3rd day after stopping Lisinopril   *If you need a refill on your cardiac medications before your next appointment, please call your pharmacy*  Lab Work: Your provider would like for you to return in 2 weeks to have the following labs drawn: BMP.   Please go to [facility] [address] ([facility] [address], [place] [address] You do not need an appointment.  They are open from 8 am- 4:30 pm.  Lunch from 1:00 pm- 2:00 pm You do not need to be fasting.   You may also go to one of the following [facility]:  [address] [place], [place] [address] Phone: [phone] Lab hours: Mon-Fri 8 am- 5 pm    Lunch 12 pm- 1 pm  [address] [place],  [place]  [address]  [place]  Phone: [phone] Lab hours: 7 am- 4 pm Lunch 12 pm-1 pm   [address] [place],  [place]  [address]  [place]  Phone: [phone] Lab hours: Mon-Fri 8 am- 5 pm    Lunch 12 pm- 1 pm  If you have labs (blood work) drawn today and your tests are completely normal, you will receive your results only by: MyChart Message (if you have MyChart) OR A paper copy in the mail If you have any lab test that is abnormal or we need to change your treatment, we will call you to review the results.  Testing/Procedures: No test ordered today   Follow-Up: At [facility], you and your health needs are our priority.  As part of our continuing mission to provide you with exceptional heart care, our providers are all part of one team.  This team includes your primary Cardiologist (physician) and Advanced Practice Providers or APPs (Physician Assistants and Nurse Practitioners) who all work together to provide you with the care you need, when you need it.  Your next appointment:   2 month(s)  Provider:   Timothy Gollan, MD  or Cadence Gennaro Khat, PA-C

## 2024-03-04 NOTE — Progress Notes (Signed)
  Cardiology Office Note:  .   Date:  03/04/2024  ID:  Morgan Kent, DOB 10/28/1981, MRN [id number] PCP: [facility]  [facility] Providers Cardiologist:  Belva Boyden, MD {    History of Present Illness: .   Morgan Kent is a 43 y.o. female  with a hx of morbid obesity, lupus, PVCs, HTN, HFmrEF, OSA on nasal CPAP who presents for 2 month follow-up for CHF.   Echo 07/2022 showed normal LVEF and normal valves.The patient was last seen 11/19/23 reporting chest pain and DOE. Echo showed mildly reduced LVEF 45-50%, no WMA, normal RVSF, mild MR. Cardiac CTA showed no CAD.   Patient was last seen in February 2025 reporting trouble swallowing and recent thrush diagnosis.  Cardiac MRI was ordered and she was started on Jardiance .  Today, the patient is overall doing well. She is doing well with the Jardiance . She denies chest pain, SOB, lower leg edema. Lupus has been the same, no flare ups. cMRI scheduled for May.    Studies Reviewed: .        Echo 12/2023 1. Left ventricular ejection fraction, by estimation, is 45 to 50%. The  left ventricle has mildly decreased function. The left ventricle has no  regional wall motion abnormalities. Left ventricular diastolic parameters  are indeterminate. The average left   ventricular global longitudinal strain is -14.1 %. The global  longitudinal strain is abnormal.   2. Right ventricular systolic function is normal. The right ventricular  size is normal.   3. The mitral valve is normal in structure. Mild mitral valve  regurgitation. No evidence of mitral stenosis.   4. The aortic valve is normal in structure. Aortic valve regurgitation is  not visualized. No aortic stenosis is present.   5. The inferior vena cava is normal in size with greater than 50%  respiratory variability, suggesting right atrial pressure of 3 mmHg.    Cardiac CTA 12/2023  IMPRESSION: 1. Coronary calcium score of 0.   2. Normal coronary origin with  right dominance.   3. No evidence of CAD.   4. CAD-RADS 0. Consider non-atherosclerotic causes of chest pain.      Physical Exam:   VS:  BP 128/84   Pulse 85   Ht 5\' 6"  (1.676 m)   Wt 220 lb (99.8 kg)   SpO2 98%   BMI 35.51 kg/m    Wt Readings from Last 3 Encounters:  03/04/24 220 lb (99.8 kg)  02/23/24 219 lb 12.8 oz (99.7 kg)  01/19/24 214 lb (97.1 kg)    GEN: Well nourished, well developed in no acute distress NECK: No JVD; No carotid bruits CARDIAC: RRR, no murmurs, rubs, gallops RESPIRATORY:  Clear to auscultation without rales, wheezing or rhonchi  ABDOMEN: Soft, non-tender, non-distended EXTREMITIES:  No edema; No deformity   ASSESSMENT AND PLAN: .    HFmrEF Echo in 2023 showed normal LVEF. Echo 12/2023 showed LVEF 45-50%, mild MR. Cardiac CTA showed no CAD. She reports minimal lower leg edema. cMRI is scheduled for next month. Continue Jardiance  10mg  daily and Coreg  25mg BID.  I will stop amlodipine  and lisinopril  (48 hours washout) and start Entresto  24/26mg BID. BMET in 2 weeks.   HTN BP is good. Stop amlodipine  and lisinopril  as above and start Entresto . Continue Coreg .       Dispo: Follow-up in 1 month  Signed, Vicci Reder Rebekah Canada, PA-C

## 2024-03-15 ENCOUNTER — Other Ambulatory Visit: Payer: Self-pay | Admitting: Physician Assistant

## 2024-03-15 DIAGNOSIS — M792 Neuralgia and neuritis, unspecified: Secondary | ICD-10-CM

## 2024-03-18 DIAGNOSIS — G894 Chronic pain syndrome: Secondary | ICD-10-CM | POA: Diagnosis not present

## 2024-03-18 DIAGNOSIS — M79662 Pain in left lower leg: Secondary | ICD-10-CM | POA: Diagnosis not present

## 2024-03-18 DIAGNOSIS — M79661 Pain in right lower leg: Secondary | ICD-10-CM | POA: Diagnosis not present

## 2024-03-18 DIAGNOSIS — Z79891 Long term (current) use of opiate analgesic: Secondary | ICD-10-CM | POA: Diagnosis not present

## 2024-03-18 DIAGNOSIS — M545 Low back pain, unspecified: Secondary | ICD-10-CM | POA: Diagnosis not present

## 2024-03-19 DIAGNOSIS — Z79899 Other long term (current) drug therapy: Secondary | ICD-10-CM | POA: Diagnosis not present

## 2024-03-19 DIAGNOSIS — R946 Abnormal results of thyroid function studies: Secondary | ICD-10-CM | POA: Diagnosis not present

## 2024-03-19 DIAGNOSIS — E538 Deficiency of other specified B group vitamins: Secondary | ICD-10-CM | POA: Diagnosis not present

## 2024-03-19 DIAGNOSIS — D509 Iron deficiency anemia, unspecified: Secondary | ICD-10-CM | POA: Diagnosis not present

## 2024-03-19 DIAGNOSIS — R5383 Other fatigue: Secondary | ICD-10-CM | POA: Diagnosis not present

## 2024-03-19 DIAGNOSIS — E559 Vitamin D deficiency, unspecified: Secondary | ICD-10-CM | POA: Diagnosis not present

## 2024-03-19 DIAGNOSIS — E782 Mixed hyperlipidemia: Secondary | ICD-10-CM | POA: Diagnosis not present

## 2024-03-20 LAB — B12 AND FOLATE PANEL
Folate: 14.8 ng/mL (ref >3.0)
Vitamin B-12: 630 pg/mL (ref 232–1245)

## 2024-03-20 LAB — COMPREHENSIVE METABOLIC PANEL WITH GFR
ALT: 7 IU/L (ref 0–32)
AST: 12 IU/L (ref 0–40)
Albumin: 4 g/dL (ref 3.9–4.9)
Alkaline Phosphatase: 69 IU/L (ref 44–121)
BUN/Creatinine Ratio: 5 — ABNORMAL LOW (ref 9–23)
BUN: 5 mg/dL — ABNORMAL LOW (ref 6–24)
Bilirubin Total: <0.2 mg/dL (ref 0.0–1.2)
CO2: 21 mmol/L (ref 20–29)
Calcium: 9.3 mg/dL (ref 8.7–10.2)
Chloride: 104 mmol/L (ref 96–106)
Creatinine, Ser: 0.92 mg/dL (ref 0.57–1.00)
Globulin, Total: 3.1 g/dL (ref 1.5–4.5)
Glucose: 120 mg/dL — ABNORMAL HIGH (ref 70–99)
Potassium: 4.4 mmol/L (ref 3.5–5.2)
Sodium: 139 mmol/L (ref 134–144)
Total Protein: 7.1 g/dL (ref 6.0–8.5)
eGFR: 79 mL/min/{1.73_m2} (ref >59)

## 2024-03-20 LAB — CBC WITH DIFFERENTIAL/PLATELET
Basophils Absolute: 0 10*3/uL (ref 0.0–0.2)
Basos: 1 %
EOS (ABSOLUTE): 0.2 10*3/uL (ref 0.0–0.4)
Eos: 4 %
Hematocrit: 35.3 % (ref 34.0–46.6)
Hemoglobin: 11.4 g/dL (ref 11.1–15.9)
Immature Grans (Abs): 0 10*3/uL (ref 0.0–0.1)
Immature Granulocytes: 0 %
Lymphocytes Absolute: 1.4 10*3/uL (ref 0.7–3.1)
Lymphs: 27 %
MCH: 32.3 pg (ref 26.6–33.0)
MCHC: 32.3 g/dL (ref 31.5–35.7)
MCV: 100 fL — ABNORMAL HIGH (ref 79–97)
Monocytes Absolute: 0.6 10*3/uL (ref 0.1–0.9)
Monocytes: 11 %
Neutrophils Absolute: 3 10*3/uL (ref 1.4–7.0)
Neutrophils: 57 %
Platelets: 396 10*3/uL (ref 150–450)
RBC: 3.53 x10E6/uL — ABNORMAL LOW (ref 3.77–5.28)
RDW: 15.2 % (ref 11.7–15.4)
WBC: 5.3 10*3/uL (ref 3.4–10.8)

## 2024-03-20 LAB — BASIC METABOLIC PANEL WITH GFR
BUN/Creatinine Ratio: 6 — ABNORMAL LOW (ref 9–23)
BUN: 6 mg/dL (ref 6–24)
CO2: 18 mmol/L — ABNORMAL LOW (ref 20–29)
Calcium: 9.1 mg/dL (ref 8.7–10.2)
Chloride: 103 mmol/L (ref 96–106)
Creatinine, Ser: 0.93 mg/dL (ref 0.57–1.00)
Glucose: 124 mg/dL — ABNORMAL HIGH (ref 70–99)
Potassium: 4.4 mmol/L (ref 3.5–5.2)
Sodium: 138 mmol/L (ref 134–144)
eGFR: 78 mL/min/{1.73_m2} (ref >59)

## 2024-03-20 LAB — LIPID PANEL WITH LDL/HDL RATIO
Cholesterol, Total: 159 mg/dL (ref 100–199)
HDL: 33 mg/dL — ABNORMAL LOW (ref >39)
LDL Chol Calc (NIH): 76 mg/dL (ref 0–99)
LDL/HDL Ratio: 2.3 ratio (ref 0.0–3.2)
Triglycerides: 312 mg/dL — ABNORMAL HIGH (ref 0–149)
VLDL Cholesterol Cal: 50 mg/dL — ABNORMAL HIGH (ref 5–40)

## 2024-03-20 LAB — IRON,TIBC AND FERRITIN PANEL
Ferritin: 70 ng/mL (ref 15–150)
Iron Saturation: 15 % (ref 15–55)
Iron: 48 ug/dL (ref 27–159)
Total Iron Binding Capacity: 319 ug/dL (ref 250–450)
UIBC: 271 ug/dL (ref 131–425)

## 2024-03-20 LAB — TSH+FREE T4
Free T4: 1.27 ng/dL (ref 0.82–1.77)
TSH: 0.546 u[IU]/mL (ref 0.450–4.500)

## 2024-03-20 LAB — VITAMIN D 25 HYDROXY (VIT D DEFICIENCY, FRACTURES): Vit D, 25-Hydroxy: 30.8 ng/mL (ref 30.0–100.0)

## 2024-03-23 ENCOUNTER — Ambulatory Visit: Payer: Self-pay

## 2024-03-24 DIAGNOSIS — I10 Essential (primary) hypertension: Secondary | ICD-10-CM | POA: Diagnosis not present

## 2024-03-24 DIAGNOSIS — N182 Chronic kidney disease, stage 2 (mild): Secondary | ICD-10-CM | POA: Diagnosis not present

## 2024-03-24 DIAGNOSIS — N2581 Secondary hyperparathyroidism of renal origin: Secondary | ICD-10-CM | POA: Diagnosis not present

## 2024-03-24 DIAGNOSIS — M329 Systemic lupus erythematosus, unspecified: Secondary | ICD-10-CM | POA: Diagnosis not present

## 2024-03-24 DIAGNOSIS — Z87442 Personal history of urinary calculi: Secondary | ICD-10-CM | POA: Diagnosis not present

## 2024-03-27 ENCOUNTER — Other Ambulatory Visit: Payer: Self-pay | Admitting: Dermatology

## 2024-03-27 ENCOUNTER — Other Ambulatory Visit: Payer: Self-pay | Admitting: Physician Assistant

## 2024-03-27 DIAGNOSIS — L930 Discoid lupus erythematosus: Secondary | ICD-10-CM

## 2024-03-27 DIAGNOSIS — J309 Allergic rhinitis, unspecified: Secondary | ICD-10-CM

## 2024-03-30 ENCOUNTER — Encounter (HOSPITAL_COMMUNITY): Payer: Self-pay

## 2024-03-30 ENCOUNTER — Other Ambulatory Visit: Payer: Self-pay | Admitting: Nephrology

## 2024-03-30 DIAGNOSIS — Z87442 Personal history of urinary calculi: Secondary | ICD-10-CM | POA: Diagnosis not present

## 2024-03-30 DIAGNOSIS — R808 Other proteinuria: Secondary | ICD-10-CM | POA: Diagnosis not present

## 2024-03-30 DIAGNOSIS — R8281 Pyuria: Secondary | ICD-10-CM | POA: Diagnosis not present

## 2024-03-30 DIAGNOSIS — N182 Chronic kidney disease, stage 2 (mild): Secondary | ICD-10-CM | POA: Diagnosis not present

## 2024-03-30 DIAGNOSIS — I10 Essential (primary) hypertension: Secondary | ICD-10-CM | POA: Diagnosis not present

## 2024-03-30 DIAGNOSIS — D631 Anemia in chronic kidney disease: Secondary | ICD-10-CM | POA: Diagnosis not present

## 2024-03-31 ENCOUNTER — Other Ambulatory Visit: Payer: Self-pay | Admitting: Medical

## 2024-03-31 ENCOUNTER — Ambulatory Visit
Admission: RE | Admit: 2024-03-31 | Discharge: 2024-03-31 | Disposition: A | Discharge status: Home / Self Care | Source: Ambulatory Visit | Attending: Medical | Admitting: Medical

## 2024-03-31 DIAGNOSIS — I5022 Chronic systolic (congestive) heart failure: Secondary | ICD-10-CM | POA: Diagnosis not present

## 2024-03-31 MED ORDER — GADOBUTROL 1 MMOL/ML IV SOLN
13.0000 mL | Freq: Once | INTRAVENOUS | Status: AC | PRN
  Administered 2024-03-31: 13 mL via INTRAVENOUS

## 2024-04-01 ENCOUNTER — Ambulatory Visit: Payer: Self-pay | Admitting: Medical

## 2024-04-05 DIAGNOSIS — J0111 Acute recurrent frontal sinusitis: Secondary | ICD-10-CM | POA: Diagnosis not present

## 2024-04-06 ENCOUNTER — Encounter: Payer: Self-pay | Admitting: Dermatology

## 2024-04-06 ENCOUNTER — Ambulatory Visit: Admitting: Dermatology

## 2024-04-06 DIAGNOSIS — B370 Candidal stomatitis: Secondary | ICD-10-CM

## 2024-04-06 DIAGNOSIS — L819 Disorder of pigmentation, unspecified: Secondary | ICD-10-CM | POA: Diagnosis not present

## 2024-04-06 DIAGNOSIS — L930 Discoid lupus erythematosus: Secondary | ICD-10-CM | POA: Diagnosis not present

## 2024-04-06 DIAGNOSIS — Z79899 Other long term (current) drug therapy: Secondary | ICD-10-CM | POA: Diagnosis not present

## 2024-04-06 DIAGNOSIS — M329 Systemic lupus erythematosus, unspecified: Secondary | ICD-10-CM

## 2024-04-06 DIAGNOSIS — K121 Other forms of stomatitis: Secondary | ICD-10-CM

## 2024-04-06 DIAGNOSIS — Z7189 Other specified counseling: Secondary | ICD-10-CM

## 2024-04-06 MED ORDER — MOMETASONE FUROATE 0.1 % EX SOLN: CUTANEOUS | 5 refills | Status: DC

## 2024-04-06 MED ORDER — CLOTRIMAZOLE 10 MG MT TROC: 10.0000 mg | Freq: Every day | OROMUCOSAL | 5 refills | Status: DC

## 2024-04-06 MED ORDER — TACROLIMUS 1 MG PO CAPS: ORAL_CAPSULE | ORAL | 5 refills | Status: DC

## 2024-04-06 MED ORDER — MOMETASONE FUROATE 0.1 % EX OINT: TOPICAL_OINTMENT | CUTANEOUS | 0 refills | Status: AC

## 2024-04-06 MED ORDER — CLOBETASOL PROPIONATE 0.05 % EX OINT: 1.0000 | TOPICAL_OINTMENT | CUTANEOUS | 5 refills | Status: AC

## 2024-04-06 MED ORDER — KETOCONAZOLE 2 % EX SHAM: MEDICATED_SHAMPOO | CUTANEOUS | 6 refills | Status: DC

## 2024-04-06 MED ORDER — MUPIROCIN 2 % EX OINT
1.0000 | TOPICAL_OINTMENT | Freq: Every day | CUTANEOUS | 5 refills | Status: AC
Start: 2024-04-06 — End: ?

## 2024-04-06 NOTE — Patient Instructions (Signed)

## 2024-04-06 NOTE — Progress Notes (Signed)
   Follow-Up Visit   Subjective  Morgan Kent is a 43 y.o. female who presents for the following: Discoid lupus face, scalp, arms 75m f/u, Mometasone  sol prn scalp, TMC 0.1% cr prn, Mupirocin  oint qd to open sores, Ketoconazole  2% shampoo 1x/wk, patient c/o thrush in her mouth, treated with the past with Tacrolimus  mouth was with a good response.   The patient has spots, moles and lesions to be evaluated, some may be new or changing and the patient may have concern these could be cancer.  The following portions of the chart were reviewed this encounter and updated as appropriate: medications, allergies, medical history  Review of Systems:  No other skin or systemic complaints except as noted in HPI or Assessment and Plan.  Objective  Well appearing patient in no apparent distress; mood and affect are within normal limits.  A focused examination was performed of the following areas: face  Relevant exam findings are noted in the Assessment and Plan.              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 Condition is symptomatic / bothersome to patient. Not to goal.  Treatment Plan: cont Mometasone  oint qd up to 5d/wk prn face Cont Mometasone  sol qd up to 5d/wk prn to aa scalp Cont  Clobetasol  oint qd up to 5d/wk aa elbows utnil clear, then prn flares Cont Mupirocin  oint qd prn to open sores Cont Ketoconazole  2% shampoo 1x/wk, let sit 5 minutes and rinse out  Topical steroids (such as triamcinolone , fluocinolone, fluocinonide , mometasone , clobetasol , halobetasol, betamethasone, hydrocortisone) can cause thinning and lightening of the skin if they are used for too long in the same area. Your physician has selected the  right strength medicine for your problem and area affected on the body. Please use your medication only as directed by your physician to prevent side effects.    Long term medication management.  Patient is using long term (months to years) prescription medication  to control their dermatologic condition.  These medications require periodic monitoring to evaluate for efficacy and side effects and may require periodic laboratory monitoring.   SYSTEMIC LUPUS ERYTHEMATOSUS  With Discoid lesions  Body, mouth Exam: pt does have joint aches, hx of ulcers in mouth Treatment Plan: Controlled by Jennine Mohair PA at [facility] - pt taking plaquenil  tablets Pt to cont f/u with Sammie Crigler Defoor PA   Lupus related mouth ulcers Lupus stomatitis/Candidiasis Re start Tacrolimus  mouth wash bid prn flares Use tacrolimus  swish and spit twice a day as needed. To make the Tacrolimus  Swish and Spit, dissolve a 1-mg tacrolimus  capsule into 500 mL of water . Swish the solution around your mouth for 2 minutes before spitting it out.  ReStart Clotrimazole  troche qd, Suck one clotrimazole  troche daily to prevent and treat thrush as needed.   DISCOID LUPUS ERYTHEMATOSUS   Related Medications mometasone  (ELOCON ) 0.1 % ointment APPLY TOPICALLY TO SORES ON FACE ONCE DAILY FOR UP TO 5 DAYS PER WEEK  Return in about 9 months (around 01/07/2025) for Lupus .  IClara Crisp, CMA, am acting as scribe for Celine Collard, MD .   Documentation: I have reviewed the above documentation for accuracy and completeness, and I agree with the above.  Celine Collard, MD

## 2024-04-07 ENCOUNTER — Ambulatory Visit: Payer: Medicare Other | Admitting: Dermatology

## 2024-04-12 ENCOUNTER — Ambulatory Visit
Admission: RE | Admit: 2024-04-12 | Discharge: 2024-04-12 | Disposition: A | Discharge status: Home / Self Care | Source: Ambulatory Visit | Attending: Nephrology | Admitting: Nephrology

## 2024-04-12 DIAGNOSIS — Z87442 Personal history of urinary calculi: Secondary | ICD-10-CM | POA: Diagnosis not present

## 2024-04-12 DIAGNOSIS — N182 Chronic kidney disease, stage 2 (mild): Secondary | ICD-10-CM | POA: Diagnosis not present

## 2024-04-13 DIAGNOSIS — G935 Compression of brain: Secondary | ICD-10-CM | POA: Diagnosis not present

## 2024-04-13 DIAGNOSIS — M542 Cervicalgia: Secondary | ICD-10-CM | POA: Diagnosis not present

## 2024-04-15 DIAGNOSIS — M545 Low back pain, unspecified: Secondary | ICD-10-CM | POA: Diagnosis not present

## 2024-04-15 DIAGNOSIS — Z79891 Long term (current) use of opiate analgesic: Secondary | ICD-10-CM | POA: Diagnosis not present

## 2024-04-15 DIAGNOSIS — M5412 Radiculopathy, cervical region: Secondary | ICD-10-CM | POA: Diagnosis not present

## 2024-04-15 DIAGNOSIS — M25511 Pain in right shoulder: Secondary | ICD-10-CM | POA: Diagnosis not present

## 2024-04-15 DIAGNOSIS — G894 Chronic pain syndrome: Secondary | ICD-10-CM | POA: Diagnosis not present

## 2024-04-15 DIAGNOSIS — M79662 Pain in left lower leg: Secondary | ICD-10-CM | POA: Diagnosis not present

## 2024-04-16 ENCOUNTER — Other Ambulatory Visit: Payer: Self-pay | Admitting: Physician Assistant

## 2024-04-16 ENCOUNTER — Other Ambulatory Visit: Payer: Self-pay | Admitting: Dermatology

## 2024-04-16 DIAGNOSIS — D509 Iron deficiency anemia, unspecified: Secondary | ICD-10-CM

## 2024-04-28 DIAGNOSIS — I7300 Raynaud's syndrome without gangrene: Secondary | ICD-10-CM | POA: Diagnosis not present

## 2024-04-28 DIAGNOSIS — Z91199 Patient's noncompliance with other medical treatment and regimen due to unspecified reason: Secondary | ICD-10-CM | POA: Diagnosis not present

## 2024-04-28 DIAGNOSIS — Z79899 Other long term (current) drug therapy: Secondary | ICD-10-CM | POA: Diagnosis not present

## 2024-04-28 DIAGNOSIS — L930 Discoid lupus erythematosus: Secondary | ICD-10-CM | POA: Diagnosis not present

## 2024-04-28 DIAGNOSIS — M329 Systemic lupus erythematosus, unspecified: Secondary | ICD-10-CM | POA: Diagnosis not present

## 2024-05-04 ENCOUNTER — Encounter: Payer: Self-pay | Admitting: Medical

## 2024-05-04 ENCOUNTER — Ambulatory Visit: Attending: Medical | Admitting: Medical

## 2024-05-04 VITALS — BP 146/97 | HR 93 | Ht 66.0 in | Wt 220.4 lb

## 2024-05-04 DIAGNOSIS — I10 Essential (primary) hypertension: Secondary | ICD-10-CM | POA: Insufficient documentation

## 2024-05-04 DIAGNOSIS — I428 Other cardiomyopathies: Secondary | ICD-10-CM | POA: Diagnosis not present

## 2024-05-04 DIAGNOSIS — I5022 Chronic systolic (congestive) heart failure: Secondary | ICD-10-CM | POA: Insufficient documentation

## 2024-05-04 NOTE — Progress Notes (Signed)
 Cardiology Office Note   Date:  05/04/2024  ID:  Morgan Kent, Morgan Kent 1981/02/29, MRN [id number] PCP: Kristina Tinnie POUR, PA-C  [facility] Providers Cardiologist:  Evalene Lunger, MD   History of Present Illness Morgan Kent is a 43 y.o. female   with a hx of morbid obesity, lupus, PVCs, HTN, HFmrEF, OSA on nasal CPAP who presents for 2 month follow-up for CHF.   Echo 07/2022 showed normal LVEF and normal valves.The patient was last seen 11/19/23 reporting chest pain and DOE. Echo showed mildly reduced LVEF 45-50%, no WMA, normal RVSF, mild MR. Cardiac CTA showed no CAD.  Cardiac MRI was ordered and she was started on Jardiance .  The patient was last seen 03/04/2024 and was overall doing well from a cardiac perspective.  cMRI showed normal LV size and systolic function of 53%, no LGE or scar, no evidence of infiltrative or inflammatory disease, normal RV function, no significant valvular abnormalities.  Today, cMRI was reviewed. The patient has been feeling good. She denies chest pain, SOB, and lower leg edema. She may have lupus flare. BP is high today, but at home it has been better.   Studies Reviewed      cMRI 03/2024 IMPRESSION: 1.  Normal LV size and systolic function.  LVEF 53%   2.  No LGE or scar.   3.  No evidence for infiltrative or inflammatory disease.   4.  Normal RV function.   5.  No significant valvular abnormalities.   Echo 12/2023 1. Left ventricular ejection fraction, by estimation, is 45 to 50%. The  left ventricle has mildly decreased function. The left ventricle has no  regional wall motion abnormalities. Left ventricular diastolic parameters  are indeterminate. The average left   ventricular global longitudinal strain is -14.1 %. The global  longitudinal strain is abnormal.   2. Right ventricular systolic function is normal. The right ventricular  size is normal.   3. The mitral valve is normal in structure. Mild mitral valve  regurgitation. No  evidence of mitral stenosis.   4. The aortic valve is normal in structure. Aortic valve regurgitation is  not visualized. No aortic stenosis is present.   5. The inferior vena cava is normal in size with greater than 50%  respiratory variability, suggesting right atrial pressure of 3 mmHg.      Cardiac CTA 12/2023  IMPRESSION: 1. Coronary calcium score of 0.   2. Normal coronary origin with right dominance.   3. No evidence of CAD.   4. CAD-RADS 0. Consider non-atherosclerotic causes of chest pain.   HYPERTENSION CONTROL Vitals:   05/04/24 0916 05/04/24 0925  BP: (!) 156/104 (!) 146/97    The patient's blood pressure is elevated above target today.  In order to address the patient's elevated BP: Blood pressure will be monitored at home to determine if medication changes need to be made.          Physical Exam VS:  BP (!) 146/97   Pulse 93   Ht 5' 6 (1.676 m)   Wt 220 lb 6.4 oz (100 kg)   SpO2 98%   BMI 35.57 kg/m        Wt Readings from Last 3 Encounters:  05/04/24 220 lb 6.4 oz (100 kg)  03/04/24 220 lb (99.8 kg)  02/23/24 219 lb 12.8 oz (99.7 kg)    GEN: Well nourished, well developed in no acute distress NECK: No JVD; No carotid bruits CARDIAC: RRR, no murmurs, rubs,  gallops RESPIRATORY:  Clear to auscultation without rales, wheezing or rhonchi  ABDOMEN: Soft, non-tender, non-distended EXTREMITIES:  No edema; No deformity   ASSESSMENT AND PLAN  Chronic HFmrEF Echo in February 2025 showed EF 45 to 50%.  Cardiac CTA showed no CAD.  Cardiac MRI showed LVEF 53%, no infiltrate FFR inflammatory disease, normal RV function, normal valvular function. The patient is euvolemic can tolerating medications well.  We will continue Jardiance  10 mg daily, Coreg  25 mg twice daily, Entresto  24/26 mg twice daily.  HTN BP mildly elevated today, but patient says it is better at home.  Will reevaluate blood pressure at follow-up.  Continue current medications.       Dispo:  follow-up in 3 months  Signed, Pema Thomure VEAR Fishman, PA-C

## 2024-05-04 NOTE — Patient Instructions (Signed)
 Medication Instructions:  Your physician recommends that you continue on your current medications as directed. Please refer to the Current Medication list given to you today.   *If you need a refill on your cardiac medications before your next appointment, please call your pharmacy*  Lab Work: None If you have labs (blood work) drawn today and your tests are completely normal, you will receive your results only by: MyChart Message (if you have MyChart) OR A paper copy in the mail If you have any lab test that is abnormal or we need to change your treatment, we will call you to review the results.  Testing/Procedures: None  Follow-Up: At [facility], you and your health needs are our priority.  As part of our continuing mission to provide you with exceptional heart care, our providers are all part of one team.  This team includes your primary Cardiologist (physician) and Advanced Practice Providers or APPs (Physician Assistants and Nurse Practitioners) who all work together to provide you with the care you need, when you need it.  Your next appointment:   3 month(s)  Provider:   You may see Timothy Gollan, MD or one of the following Advanced Practice Providers on your designated Care Team:   Lonni Meager, NP Lesley Maffucci, PA-C Bernardino Bring, PA-C Cadence Smithwick, PA-C Tylene Lunch, NP Barnie Hila, NP

## 2024-05-13 DIAGNOSIS — G894 Chronic pain syndrome: Secondary | ICD-10-CM | POA: Diagnosis not present

## 2024-05-13 DIAGNOSIS — M545 Low back pain, unspecified: Secondary | ICD-10-CM | POA: Diagnosis not present

## 2024-05-13 DIAGNOSIS — M79661 Pain in right lower leg: Secondary | ICD-10-CM | POA: Diagnosis not present

## 2024-05-13 DIAGNOSIS — M79662 Pain in left lower leg: Secondary | ICD-10-CM | POA: Diagnosis not present

## 2024-05-13 DIAGNOSIS — Z79891 Long term (current) use of opiate analgesic: Secondary | ICD-10-CM | POA: Diagnosis not present

## 2024-05-15 ENCOUNTER — Other Ambulatory Visit: Payer: Self-pay | Admitting: Physician Assistant

## 2024-05-15 DIAGNOSIS — J309 Allergic rhinitis, unspecified: Secondary | ICD-10-CM

## 2024-06-03 ENCOUNTER — Encounter: Payer: Self-pay | Admitting: Dermatology

## 2024-06-03 ENCOUNTER — Ambulatory Visit: Admitting: Dermatology

## 2024-06-03 DIAGNOSIS — D492 Neoplasm of unspecified behavior of bone, soft tissue, and skin: Secondary | ICD-10-CM

## 2024-06-03 NOTE — Progress Notes (Signed)
   Follow-Up Visit   Subjective  Morgan Kent is a 43 y.o. female who presents for the following: Spot in left ear. Dur: 2 weeks. Painful bump. Saw blood and white stuff when used Q-tip in ear. She states it is not affecting her hearing. Causing left side of face pain.   The patient has spots, moles and lesions to be evaluated, some may be new or changing and the patient may have concern these could be cancer.    The following portions of the chart were reviewed this encounter and updated as appropriate: medications, allergies, medical history  Review of Systems:  No other skin or systemic complaints except as noted in HPI or Assessment and Plan.  Objective  Well appearing patient in no apparent distress; mood and affect are within normal limits.  A focused examination was performed of the following areas: Face, ears  Relevant physical exam findings are noted in the Assessment and Plan.  Left External Auditory Meatus Brown pedunculated papule obstructing left external acoustic meatus       Assessment & Plan   NEOPLASM OF SKIN Left External Auditory Meatus Recommend referral to ENT. Related Procedures Ambulatory referral to ENT   Return for Follow Up As Scheduled, With Dr. Hester.  I, Jill Parcell, CMA, am acting as scribe for Boneta Sharps, MD.   Documentation: I have reviewed the above documentation for accuracy and completeness, and I agree with the above.  Boneta Sharps, MD

## 2024-06-03 NOTE — Patient Instructions (Signed)

## 2024-06-04 DIAGNOSIS — H938X1 Other specified disorders of right ear: Secondary | ICD-10-CM | POA: Diagnosis not present

## 2024-06-04 DIAGNOSIS — H9201 Otalgia, right ear: Secondary | ICD-10-CM | POA: Diagnosis not present

## 2024-06-11 ENCOUNTER — Other Ambulatory Visit: Payer: Self-pay | Admitting: Otolaryngology

## 2024-06-11 DIAGNOSIS — D2321 Other benign neoplasm of skin of right ear and external auricular canal: Secondary | ICD-10-CM

## 2024-06-24 ENCOUNTER — Encounter: Payer: Self-pay | Admitting: Physician Assistant

## 2024-06-24 ENCOUNTER — Ambulatory Visit (INDEPENDENT_AMBULATORY_CARE_PROVIDER_SITE_OTHER): Payer: Medicare Other | Admitting: Physician Assistant

## 2024-06-24 ENCOUNTER — Other Ambulatory Visit: Payer: Self-pay | Admitting: Physician Assistant

## 2024-06-24 VITALS — BP 130/88 | HR 96 | Temp 98.3°F | Resp 16 | Ht 66.0 in | Wt 237.0 lb

## 2024-06-24 DIAGNOSIS — D649 Anemia, unspecified: Secondary | ICD-10-CM | POA: Diagnosis not present

## 2024-06-24 DIAGNOSIS — R718 Other abnormality of red blood cells: Secondary | ICD-10-CM | POA: Diagnosis not present

## 2024-06-24 DIAGNOSIS — R7301 Impaired fasting glucose: Secondary | ICD-10-CM | POA: Diagnosis not present

## 2024-06-24 DIAGNOSIS — Z0000 Encounter for general adult medical examination without abnormal findings: Secondary | ICD-10-CM

## 2024-06-24 DIAGNOSIS — I10 Essential (primary) hypertension: Secondary | ICD-10-CM | POA: Diagnosis not present

## 2024-06-24 DIAGNOSIS — M792 Neuralgia and neuritis, unspecified: Secondary | ICD-10-CM

## 2024-06-24 DIAGNOSIS — Z1231 Encounter for screening mammogram for malignant neoplasm of breast: Secondary | ICD-10-CM

## 2024-06-24 NOTE — Progress Notes (Addendum)
 [facility] [address] [place], [place] [address]  Internal MEDICINE  Office Visit Note  Patient Name: Morgan Kent  [id number]  [id number]  Date of Service: 06/24/2024  Chief Complaint  Patient presents with   Medicare Wellness   Hypertension   Depression    HPI Morgan Kent presents for an annual well visit Well-appearing 43 y.o. female  Routine mammogram: Due Pap smear: Done 01/2024--ASCUS +, but HPV-, will need repeat in 3years Labs: reviewed, also had labs more recently with nephrology--RBC/hgb low, will recheck CBC Other concerns: Following with ENT after she had a bump in ear, had some ABX, but this burst on its own. States they planned to get a CT and is unsure if this is still happening. Goes back next week     06/24/2024   10:06 AM 06/19/2023   10:19 AM 06/13/2022   10:28 AM  MMSE - Mini Mental State Exam  Orientation to time 5 3 5   Orientation to Place 5 5 5   Registration 3 3 3   Attention/ Calculation 5 5 5   Recall 3 3 3   Language- name 2 objects 2 2 2   Language- repeat 1 1 1   Language- follow 3 step command 3 3 3   Language- read & follow direction 1 1 1   Write a sentence 1 1 1   Copy design 1 1 1   Total score 30 28 30     Functional Status Survey: Is the patient deaf or have difficulty hearing?: No Does the patient have difficulty seeing, even when wearing glasses/contacts?: No Does the patient have difficulty concentrating, remembering, or making decisions?: No Does the patient have difficulty walking or climbing stairs?: No Does the patient have difficulty dressing or bathing?: No Does the patient have difficulty doing errands alone such as visiting a doctor's office or shopping?: No     10/17/2022    9:45 AM 02/20/2023    9:41 AM 06/19/2023   10:17 AM 01/19/2024    9:25 AM 06/24/2024   10:06 AM  Fall Risk  Falls in the past year? 0 0 0 0 0  Was there an injury with Fall?   0    Fall Risk Category Calculator   0    Patient at Risk for Falls  Due to   No Fall Risks    Fall risk Follow up   Falls evaluation completed Falls evaluation completed        06/24/2024   10:06 AM  Depression screen PHQ 2/9  Decreased Interest 0  Down, Depressed, Hopeless 0  PHQ - 2 Score 0        No data to display            Current Medication: Outpatient Encounter Medications as of 06/24/2024  Medication Sig   albuterol  (VENTOLIN  HFA) 108 (90 Base) MCG/ACT inhaler INHALE 2 PUFFS INTO THE LUNGS DAILY   budeson-glycopyrrolate -formoterol (BREZTRI  AEROSPHERE) 160-9-4.8 MCG/ACT AERO inhaler INHALE 2 PUFFS BY MOUTH INTO THE LUNGS TWICE DAILY   carvedilol  (COREG ) 25 MG tablet TAKE 1 TABLET(25 MG) BY MOUTH TWICE DAILY   cetirizine  (ZYRTEC ) 10 MG tablet Take 1 tablet (10 mg total) by mouth daily.   clobetasol  ointment (TEMOVATE ) 0.05 % Apply 1 Application topically as directed. qd up to 5 days a week to lupus on arms as needed for flares, avoid face, groin, axilla   clotrimazole  (MYCELEX ) 10 MG troche TAKE 1 BY MOUTH EVERY DAY, SUCH ON ONE TROCHE UNTIL DISSOLVED   Cyanocobalamin  (B-12 COMPLIANCE INJECTION) 1000  MCG/ML KIT Inject as directed.   cyclobenzaprine  (FLEXERIL ) 10 MG tablet Take 1 tablet (10 mg total) by mouth 3 (three) times daily as needed.   diclofenac  (VOLTAREN ) 50 MG EC tablet Take 50 mg by mouth 2 (two) times daily.   diclofenac  Sodium (VOLTAREN ) 1 % GEL APPLY 2 GRAMS TOPICALLY TO THE AFFECTED AREA FOUR TIMES DAILY   empagliflozin  (JARDIANCE ) 10 MG TABS tablet Take 1 tablet (10 mg total) by mouth daily before breakfast.   FEROSUL 325 (65 Fe) MG tablet TAKE 1 TABLET BY MOUTH DAILY WITH BREAKFAST   fluticasone  (FLONASE ) 50 MCG/ACT nasal spray USE 1 SPRAY IN RIGHT NOSTRIL AT BEDTIME   folic acid  (FOLVITE ) 1 MG tablet Take 1 mg by mouth daily.   hydroxychloroquine  (PLAQUENIL ) 200 MG tablet Take 1 tablet (200 mg total) by mouth daily.   ketoconazole  (NIZORAL ) 2 % shampoo Apply topically once a week.   methotrexate (RHEUMATREX) 2.5 MG  tablet Take 2.5 mg by mouth once a week. Caution:Chemotherapy. Protect from light.   metoprolol  tartrate (LOPRESSOR ) 100 MG tablet Take 1 tablet (100 mg total) by mouth once for 1 dose. 2 hours prior to the CT   mometasone  (ELOCON ) 0.1 % lotion Apply topically as directed. Qd up to 5 days a week to affected area of scalp as needed for flares   mometasone  (ELOCON ) 0.1 % ointment APPLY TOPICALLY TO SORES ON FACE ONCE DAILY FOR UP TO 5 DAYS PER WEEK   montelukast  (SINGULAIR ) 10 MG tablet Take 1 tablet (10 mg total) by mouth at bedtime.   mupirocin  ointment (BACTROBAN ) 2 % Apply 1 Application topically daily. Qd to open sores face, body prn flares   NON FORMULARY CPAP with AHP   norethindrone -ethinyl estradiol-iron (AUROVELA FE 1.5/30) 1.5-30 MG-MCG tablet Take 1 tablet by mouth daily.   oxyCODONE -acetaminophen  (PERCOCET) 7.5-325 MG tablet Take 1 tablet by mouth every 4 (four) hours as needed for severe pain.   Polyethyl Glycol-Propyl Glycol (SYSTANE ULTRA) 0.4-0.3 % SOLN Place 1 drop into both eyes daily as needed (for dry eyes).    sacubitril-valsartan (ENTRESTO ) 24-26 MG Take 1 tablet by mouth 2 (two) times daily.   tacrolimus  (PROGRAF ) 1 MG capsule OPEN 1 CAPSULE AND DISSOLVE INTO A HALF LITER OF WATER ( ), SWISH AROUND FOR 2 MINUTES THEN SPIT. DO TWICE DAILY   tacrolimus  (PROTOPIC ) 0.1 % ointment APPLY TOPICALLY TWICE DAILY TO CORNERS OF MOUTH AS NEEDED FOR FLARES.   Vitamin D , Ergocalciferol , (DRISDOL ) 1.25 MG (50000 UNIT) CAPS capsule TAKE 1 CAPSULE BY MOUTH EVERY WEEK   [DISCONTINUED] Dapsone  7.5 % GEL APPLY TOPICALLY TO THE AFFECTED AREA EVERY DAY   [DISCONTINUED] gabapentin  (NEURONTIN ) 300 MG capsule TAKE 1 CAPSULE(300 MG) BY MOUTH THREE TIMES DAILY   [DISCONTINUED] amLODipine  (NORVASC ) 5 MG tablet TAKE 1 TABLET(5 MG) BY MOUTH DAILY   [DISCONTINUED] clobetasol  ointment (TEMOVATE ) 0.05 % Apply 1 Application topically as directed. qd up to 5 days a week to lupus on arms as needed for flares,  avoid face, groin, axilla   [DISCONTINUED] clotrimazole  (MYCELEX ) 10 MG troche Take 1 tablet (10 mg total) by mouth daily. Suck on one troche daily to prevent thrush   [DISCONTINUED] FEROSUL 325 (65 Fe) MG tablet TAKE 1 TABLET BY MOUTH DAILY WITH BREAKFAST   [DISCONTINUED] fluticasone  (FLONASE ) 50 MCG/ACT nasal spray USE 1 SPRAY IN EACH NOSTRIL AT BEDTIME   [DISCONTINUED] gabapentin  (NEURONTIN ) 300 MG capsule TAKE 1 CAPSULE(300 MG) BY MOUTH THREE TIMES DAILY   [DISCONTINUED] ketoconazole  (NIZORAL ) 2 % shampoo  Apply topically once a week.   [DISCONTINUED] lisinopril  (ZESTRIL ) 5 MG tablet Take 1 tablet (5 mg total) by mouth daily.   [DISCONTINUED] mometasone  (ELOCON ) 0.1 % lotion Apply topically as directed. Qd up to 5 days a week to affected area of scalp as needed for flares   [DISCONTINUED] mometasone  (ELOCON ) 0.1 % ointment APPLY TOPICALLY TO SORES ON FACE DAILY up to 5 days a week AS NEEDED FOR FLARES   [DISCONTINUED] mupirocin  ointment (BACTROBAN ) 2 % Apply 1 Application topically daily. Qd to open sores face, body prn flares   [DISCONTINUED] tacrolimus  (PROGRAF ) 1 MG capsule OPEN 1 CAPSULE AND DISSOLVE INTO A HALF LITER OF WATER ( ), SWISH AROUND FOR 2 MINUTES THEN SPIT. DO TWICE DAILY   [DISCONTINUED] triamcinolone  cream (KENALOG ) 0.1 % APPLY EXTERNALLY TO THE AFFECTED AREA TWICE DAILY AS NEEDED FOR UP TO 5 DAYS A WEEK (Patient not taking: Reported on 04/06/2024)   No facility-administered encounter medications on file as of 06/24/2024.    Surgical History: Past Surgical History:  Procedure Laterality Date   carpel tunnel  Left 2014   CRANIOTOMY  14,06   chiari   DENTAL SURGERY     HERNIA REPAIR     umbilical   PLACEMENT OF LUMBAR DRAIN N/A 07/07/2014   Procedure: PLACEMENT OF LUMBAR DRAIN AND CLOSURE OF CERVICAL INCISION;  Surgeon: Reyes Budge, MD;  Location: [facility];  Service: Neurosurgery;  Laterality: N/A;   SUBOCCIPITAL CRANIECTOMY CERVICAL LAMINECTOMY N/A 09/29/2013    Procedure: SUBOCCIPITAL CRANIECTOMY CERVICAL LAMINECTOMY/DURAPLASTY;  Surgeon: Reyes JONETTA Budge, MD;  Location: [facility];  Service: Neurosurgery;  Laterality: N/A;  posterior   SUBOCCIPITAL CRANIECTOMY CERVICAL LAMINECTOMY N/A 06/23/2014   Procedure: SUBOCCIPITAL CRANIECTOMY CERVICAL LAMINECTOMY/DURAPLASTY;  Surgeon: Reyes Budge, MD;  Location: [facility];  Service: Neurosurgery;  Laterality: N/A;  suboccipital craniectomy with cervical laminectomy and duraplasty    Medical History: Past Medical History:  Diagnosis Date   Anxiety    Arthritis    rheumo possible   Chiari malformation    Depression    Dizziness    Headache(784.0)    History of kidney stones    Hypertension    Lupus    Lupus    Nerve damage    NECK   PONV (postoperative nausea and vomiting)    nausea only   Sleep apnea    TMJ arthritis     Family History: Family History  Problem Relation Age of Onset   Hypertension Mother    Thyroid  disease Mother    Heart disease Father    Hypertension Father    Breast cancer Other 9 - 36       maternal great grandmaw   Asthma Other    Depression Other    Diabetes Other    Migraines Other    Stroke Other     Social History   Socioeconomic History   Marital status: Single    Spouse name: Not on file   Number of children: Not on file   Years of education: Not on file   Highest education level: Not on file  Occupational History   Not on file  Tobacco Use   Smoking status: Never   Smokeless tobacco: Never  Vaping Use   Vaping status: Never Used  Substance and Sexual Activity   Alcohol  use: No    Alcohol /week: 0.0 standard drinks of alcohol    Drug use: No   Sexual activity: Not on file  Other Topics Concern   Not on  file  Social History Narrative   Lives with 3 children in 2 story home.  Unemployed.  Trying to get disability.  Education: high school.   Social Drivers of Corporate investment banker Strain: Not on file  Food Insecurity: Not on  file  Transportation Needs: Not on file  Physical Activity: Not on file  Stress: Not on file  Social Connections: Not on file  Intimate Partner Violence: Not on file      Review of Systems  Constitutional:  Negative for chills and unexpected weight change.  HENT:  Negative for congestion, rhinorrhea, sneezing and sore throat.   Eyes:  Negative for redness.  Respiratory:  Negative for cough, chest tightness and shortness of breath.   Cardiovascular:  Negative for chest pain and palpitations.  Gastrointestinal:  Negative for abdominal pain, constipation, diarrhea, nausea and vomiting.  Genitourinary:  Negative for dysuria and frequency.  Musculoskeletal:  Positive for arthralgias. Negative for back pain, joint swelling and neck pain.  Skin:  Negative for rash.  Neurological: Negative.  Negative for tremors and numbness.  Hematological:  Negative for adenopathy. Does not bruise/bleed easily.  Psychiatric/Behavioral:  Negative for behavioral problems (Depression), sleep disturbance and suicidal ideas. The patient is not nervous/anxious.     Vital Signs: BP 130/88 Comment: 127/100  Pulse 96   Temp 98.3 F (36.8 C)   Resp 16   Ht 5' 6 (1.676 m)   Wt 237 lb (107.5 kg)   SpO2 98%   BMI 38.25 kg/m    Physical Exam Vitals and nursing note reviewed.  Constitutional:      General: She is not in acute distress.    Appearance: Normal appearance. She is obese. She is not ill-appearing.  HENT:     Head: Normocephalic and atraumatic.  Eyes:     Pupils: Pupils are equal, round, and reactive to light.  Cardiovascular:     Rate and Rhythm: Normal rate and regular rhythm.     Heart sounds: Normal heart sounds. No murmur heard. Pulmonary:     Effort: Pulmonary effort is normal. No respiratory distress.     Breath sounds: Normal breath sounds. No wheezing.  Skin:    General: Skin is warm and dry.  Neurological:     Mental Status: She is alert and oriented to person, place, and time.   Psychiatric:        Mood and Affect: Mood normal.        Behavior: Behavior normal.        Assessment/Plan: 1. Encounter for annual wellness exam in Medicare patient (Primary) AWV performed, labs reviewed, due for mammogram  2. Essential hypertension Borderline in office, continue current medication  3. Low hemoglobin - CBC w/Diff/Platelet  4. Polychromasia Will recheck labs - CBC w/Diff/Platelet  5. Impaired fasting blood sugar - Hgb A1C w/o eAG  6. Visit for screening mammogram - MM 3D SCREENING MAMMOGRAM BILATERAL BREAST; Future     General Counseling: Momo verbalizes understanding of the findings of todays visit and agrees with plan of treatment. I have discussed any further diagnostic evaluation that may be needed or ordered today. We also reviewed her medications today. she has been encouraged to call the office with any questions or concerns that should arise related to todays visit.    Orders Placed This Encounter  Procedures   MM 3D SCREENING MAMMOGRAM BILATERAL BREAST   CBC w/Diff/Platelet   Hgb A1C w/o eAG    No orders of the defined types were  placed in this encounter.   Return in about 3 months (around 09/24/2024) for general follow up.   Total time spent:35 Minutes Time spent includes review of chart, medications, test results, and follow up plan with the patient.   [place] Controlled Substance Database was reviewed by me.  This patient was seen by Tinnie Pro, PA-C in collaboration with Dr. Sigrid Bathe as a part of collaborative care agreement.  Tinnie Pro, PA-C Internal medicine

## 2024-06-25 DIAGNOSIS — G894 Chronic pain syndrome: Secondary | ICD-10-CM | POA: Diagnosis not present

## 2024-06-25 DIAGNOSIS — M25511 Pain in right shoulder: Secondary | ICD-10-CM | POA: Diagnosis not present

## 2024-06-25 DIAGNOSIS — M545 Low back pain, unspecified: Secondary | ICD-10-CM | POA: Diagnosis not present

## 2024-06-25 DIAGNOSIS — F4542 Pain disorder with related psychological factors: Secondary | ICD-10-CM | POA: Diagnosis not present

## 2024-06-25 DIAGNOSIS — Z79891 Long term (current) use of opiate analgesic: Secondary | ICD-10-CM | POA: Diagnosis not present

## 2024-06-30 ENCOUNTER — Encounter: Payer: Medicare Other | Admitting: Internal Medicine

## 2024-06-30 DIAGNOSIS — R7301 Impaired fasting glucose: Secondary | ICD-10-CM | POA: Diagnosis not present

## 2024-06-30 DIAGNOSIS — R718 Other abnormality of red blood cells: Secondary | ICD-10-CM | POA: Diagnosis not present

## 2024-06-30 DIAGNOSIS — D649 Anemia, unspecified: Secondary | ICD-10-CM | POA: Diagnosis not present

## 2024-07-01 ENCOUNTER — Ambulatory Visit (INDEPENDENT_AMBULATORY_CARE_PROVIDER_SITE_OTHER): Admitting: Physician Assistant

## 2024-07-01 ENCOUNTER — Other Ambulatory Visit: Payer: Self-pay | Admitting: Dermatology

## 2024-07-01 ENCOUNTER — Encounter: Payer: Self-pay | Admitting: Physician Assistant

## 2024-07-01 VITALS — BP 132/90 | HR 86 | Temp 98.3°F | Resp 16 | Ht 66.0 in | Wt 226.0 lb

## 2024-07-01 DIAGNOSIS — D7289 Other specified disorders of white blood cells: Secondary | ICD-10-CM

## 2024-07-01 DIAGNOSIS — R718 Other abnormality of red blood cells: Secondary | ICD-10-CM | POA: Diagnosis not present

## 2024-07-01 DIAGNOSIS — R7303 Prediabetes: Secondary | ICD-10-CM

## 2024-07-01 DIAGNOSIS — R81 Glycosuria: Secondary | ICD-10-CM

## 2024-07-01 DIAGNOSIS — L700 Acne vulgaris: Secondary | ICD-10-CM

## 2024-07-01 LAB — CBC WITH DIFFERENTIAL/PLATELET
Basophils Absolute: 0 x10E3/uL (ref 0.0–0.2)
Basos: 1 %
EOS (ABSOLUTE): 0.1 x10E3/uL (ref 0.0–0.4)
Eos: 3 %
Hematocrit: 37.7 % (ref 34.0–46.6)
Hemoglobin: 12.1 g/dL (ref 11.1–15.9)
Immature Grans (Abs): 0 x10E3/uL (ref 0.0–0.1)
Immature Granulocytes: 0 %
Lymphocytes Absolute: 2 x10E3/uL (ref 0.7–3.1)
Lymphs: 49 %
MCH: 31 pg (ref 26.6–33.0)
MCHC: 32.1 g/dL (ref 31.5–35.7)
MCV: 97 fL (ref 79–97)
Monocytes Absolute: 0.6 x10E3/uL (ref 0.1–0.9)
Monocytes: 16 %
Neutrophils Absolute: 1.2 x10E3/uL — ABNORMAL LOW (ref 1.4–7.0)
Neutrophils: 31 %
Platelets: 325 x10E3/uL (ref 150–450)
RBC: 3.9 x10E6/uL (ref 3.77–5.28)
RDW: 15.1 % (ref 11.7–15.4)
WBC: 4 x10E3/uL (ref 3.4–10.8)

## 2024-07-01 LAB — HGB A1C W/O EAG: Hgb A1c MFr Bld: 6.4 % — ABNORMAL HIGH (ref 4.8–5.6)

## 2024-07-01 NOTE — Progress Notes (Signed)
 [facility] [address] [place], [place] [address]  Internal MEDICINE  Office Visit Note  Patient Name: Morgan Kent  [id number]  [id number]  Date of Service: 07/01/2024  Chief Complaint  Patient presents with   Acute Visit   Prediabetes     HPI Pt is here for a sick visit. -pain provider did a urine test and showed high alcohol  content despite her not drinking any alcohol . Advised to follow up due to likely impact of elevated sugar in urine impacting this -she had an A1c ordered last visit and just resulted yesterday showing elevated at 6.4 in prediabetic range -admits she has been drinking more kool-aid/sugary drinks and is making changes now to improve this. Will be switching to water  only -also discussed that she is on Jardiance  which contributes to glucose in urine as well -BP borderline and will be getting monitor to check at home -polychromasia and atypical lymphocytes on repeat CBC. Will refer to hematology for further evaluation  Current Medication:  Outpatient Encounter Medications as of 07/01/2024  Medication Sig   albuterol  (VENTOLIN  HFA) 108 (90 Base) MCG/ACT inhaler INHALE 2 PUFFS INTO THE LUNGS DAILY   budeson-glycopyrrolate -formoterol (BREZTRI  AEROSPHERE) 160-9-4.8 MCG/ACT AERO inhaler INHALE 2 PUFFS BY MOUTH INTO THE LUNGS TWICE DAILY   carvedilol  (COREG ) 25 MG tablet TAKE 1 TABLET(25 MG) BY MOUTH TWICE DAILY   cetirizine  (ZYRTEC ) 10 MG tablet Take 1 tablet (10 mg total) by mouth daily.   clobetasol  ointment (TEMOVATE ) 0.05 % Apply 1 Application topically as directed. qd up to 5 days a week to lupus on arms as needed for flares, avoid face, groin, axilla   clotrimazole  (MYCELEX ) 10 MG troche TAKE 1 BY MOUTH EVERY DAY, SUCH ON ONE TROCHE UNTIL DISSOLVED   Cyanocobalamin  (B-12 COMPLIANCE INJECTION) 1000 MCG/ML KIT Inject as directed.   cyclobenzaprine  (FLEXERIL ) 10 MG tablet Take 1 tablet (10 mg total) by mouth 3 (three) times daily as needed.    Dapsone  7.5 % GEL APPLY TOPICALLY TO THE AFFECTED AREA EVERY DAY   diclofenac  (VOLTAREN ) 50 MG EC tablet Take 50 mg by mouth 2 (two) times daily.   diclofenac  Sodium (VOLTAREN ) 1 % GEL APPLY 2 GRAMS TOPICALLY TO THE AFFECTED AREA FOUR TIMES DAILY   empagliflozin  (JARDIANCE ) 10 MG TABS tablet Take 1 tablet (10 mg total) by mouth daily before breakfast.   FEROSUL 325 (65 Fe) MG tablet TAKE 1 TABLET BY MOUTH DAILY WITH BREAKFAST   fluticasone  (FLONASE ) 50 MCG/ACT nasal spray USE 1 SPRAY IN RIGHT NOSTRIL AT BEDTIME   folic acid  (FOLVITE ) 1 MG tablet Take 1 mg by mouth daily.   gabapentin  (NEURONTIN ) 300 MG capsule TAKE 1 CAPSULE(300 MG) BY MOUTH THREE TIMES DAILY   hydroxychloroquine  (PLAQUENIL ) 200 MG tablet Take 1 tablet (200 mg total) by mouth daily.   ketoconazole  (NIZORAL ) 2 % shampoo Apply topically once a week.   methotrexate (RHEUMATREX) 2.5 MG tablet Take 2.5 mg by mouth once a week. Caution:Chemotherapy. Protect from light.   metoprolol  tartrate (LOPRESSOR ) 100 MG tablet Take 1 tablet (100 mg total) by mouth once for 1 dose. 2 hours prior to the CT   mometasone  (ELOCON ) 0.1 % lotion Apply topically as directed. Qd up to 5 days a week to affected area of scalp as needed for flares   mometasone  (ELOCON ) 0.1 % ointment APPLY TOPICALLY TO SORES ON FACE ONCE DAILY FOR UP TO 5 DAYS PER WEEK   montelukast  (SINGULAIR ) 10 MG tablet Take 1 tablet (10 mg  total) by mouth at bedtime.   mupirocin  ointment (BACTROBAN ) 2 % Apply 1 Application topically daily. Qd to open sores face, body prn flares   NON FORMULARY CPAP with AHP   norethindrone -ethinyl estradiol-iron (AUROVELA FE 1.5/30) 1.5-30 MG-MCG tablet Take 1 tablet by mouth daily.   oxyCODONE -acetaminophen  (PERCOCET) 7.5-325 MG tablet Take 1 tablet by mouth every 4 (four) hours as needed for severe pain.   Polyethyl Glycol-Propyl Glycol (SYSTANE ULTRA) 0.4-0.3 % SOLN Place 1 drop into both eyes daily as needed (for dry eyes).    sacubitril-valsartan  (ENTRESTO ) 24-26 MG Take 1 tablet by mouth 2 (two) times daily.   tacrolimus  (PROGRAF ) 1 MG capsule OPEN 1 CAPSULE AND DISSOLVE INTO A HALF LITER OF WATER ( ), SWISH AROUND FOR 2 MINUTES THEN SPIT. DO TWICE DAILY   tacrolimus  (PROTOPIC ) 0.1 % ointment APPLY TOPICALLY TWICE DAILY TO CORNERS OF MOUTH AS NEEDED FOR FLARES.   Vitamin D , Ergocalciferol , (DRISDOL ) 1.25 MG (50000 UNIT) CAPS capsule TAKE 1 CAPSULE BY MOUTH EVERY WEEK   [DISCONTINUED] Dapsone  7.5 % GEL APPLY TOPICALLY TO THE AFFECTED AREA EVERY DAY   No facility-administered encounter medications on file as of 07/01/2024.      Medical History: Past Medical History:  Diagnosis Date   Anxiety    Arthritis    rheumo possible   Chiari malformation    Depression    Dizziness    Headache(784.0)    History of kidney stones    Hypertension    Lupus    Lupus    Nerve damage    NECK   PONV (postoperative nausea and vomiting)    nausea only   Sleep apnea    TMJ arthritis      Vital Signs: BP (!) 132/90 Comment: 139/106  Pulse 86   Temp 98.3 F (36.8 C)   Resp 16   Ht 5' 6 (1.676 m)   Wt 226 lb (102.5 kg)   SpO2 99%   BMI 36.48 kg/m    Review of Systems  Constitutional:  Negative for fatigue and fever.  HENT:  Negative for congestion, mouth sores and postnasal drip.   Respiratory:  Negative for cough.   Cardiovascular:  Negative for chest pain.  Genitourinary:  Negative for flank pain.  Musculoskeletal:  Positive for arthralgias.  Skin:  Negative for rash.  Psychiatric/Behavioral: Negative.      Physical Exam Vitals reviewed.  Constitutional:      General: She is not in acute distress.    Appearance: Normal appearance. She is obese. She is not ill-appearing.  HENT:     Head: Normocephalic and atraumatic.  Eyes:     Extraocular Movements: Extraocular movements intact.  Cardiovascular:     Rate and Rhythm: Normal rate and regular rhythm.     Heart sounds: Normal heart sounds. No murmur  heard. Pulmonary:     Effort: Pulmonary effort is normal. No respiratory distress.     Breath sounds: Normal breath sounds. No wheezing or rales.  Skin:    General: Skin is warm and dry.  Neurological:     Mental Status: She is alert.  Psychiatric:        Mood and Affect: Mood normal.        Behavior: Behavior normal.       Assessment/Plan: 1. Prediabetes (Primary) A1c at 6.4 and pt admits to poor diet. She is going to switch out kool-aid and sugary drinks for water  now and work on diet. Will continue to monitor. Pt is on  Jardiance  already due to HF with cardiology  2. Glucosuria Likely due to worsening diet and prediabetes as well as due to Jardiance   3. Polychromasia Will refer to hematology for further evaluation - Ambulatory referral to Hematology / Oncology  4. Atypical lymphocytes present on peripheral blood smear Will refer to hematology for further evaluation - Ambulatory referral to Hematology / Oncology   General Counseling: Ita verbalizes understanding of the findings of todays visit and agrees with plan of treatment. I have discussed any further diagnostic evaluation that may be needed or ordered today. We also reviewed her medications today. she has been encouraged to call the office with any questions or concerns that should arise related to todays visit.    Counseling:    Orders Placed This Encounter  Procedures   Ambulatory referral to Hematology / Oncology    No orders of the defined types were placed in this encounter.   Time spent:30 Minutes

## 2024-07-02 DIAGNOSIS — H6002 Abscess of left external ear: Secondary | ICD-10-CM | POA: Diagnosis not present

## 2024-07-02 DIAGNOSIS — L732 Hidradenitis suppurativa: Secondary | ICD-10-CM | POA: Diagnosis not present

## 2024-07-02 DIAGNOSIS — D4819 Other specified neoplasm of uncertain behavior of connective and other soft tissue: Secondary | ICD-10-CM | POA: Diagnosis not present

## 2024-07-02 DIAGNOSIS — H6020 Malignant otitis externa, unspecified ear: Secondary | ICD-10-CM | POA: Diagnosis not present

## 2024-07-14 ENCOUNTER — Telehealth: Payer: Self-pay | Admitting: Physician Assistant

## 2024-07-14 NOTE — Telephone Encounter (Signed)
 [facility] referral has been closed due to patient not returning calls -Andree

## 2024-07-21 ENCOUNTER — Telehealth: Payer: Self-pay | Admitting: Physician Assistant

## 2024-07-21 NOTE — Telephone Encounter (Signed)
 Called patient regarding upcoming appointment. She stated that she has been cleaning out cpap machine and supplies with bleach because they have mold and mildew growing in them. She stated she has called [facility], and was told Burnard would reach out to her. I s/w Burnard, she will check into this when she is back in office tomorrow-[name]

## 2024-07-22 ENCOUNTER — Other Ambulatory Visit: Payer: Self-pay | Admitting: Dermatology

## 2024-07-22 DIAGNOSIS — J0100 Acute maxillary sinusitis, unspecified: Secondary | ICD-10-CM | POA: Diagnosis not present

## 2024-07-23 DIAGNOSIS — G894 Chronic pain syndrome: Secondary | ICD-10-CM | POA: Diagnosis not present

## 2024-07-23 DIAGNOSIS — F4542 Pain disorder with related psychological factors: Secondary | ICD-10-CM | POA: Diagnosis not present

## 2024-07-23 DIAGNOSIS — Z79891 Long term (current) use of opiate analgesic: Secondary | ICD-10-CM | POA: Diagnosis not present

## 2024-07-23 DIAGNOSIS — M5136 Other intervertebral disc degeneration, lumbar region with discogenic back pain only: Secondary | ICD-10-CM | POA: Diagnosis not present

## 2024-07-23 DIAGNOSIS — G629 Polyneuropathy, unspecified: Secondary | ICD-10-CM | POA: Diagnosis not present

## 2024-07-29 DIAGNOSIS — Z79899 Other long term (current) drug therapy: Secondary | ICD-10-CM | POA: Diagnosis not present

## 2024-07-29 DIAGNOSIS — M329 Systemic lupus erythematosus, unspecified: Secondary | ICD-10-CM | POA: Diagnosis not present

## 2024-07-29 DIAGNOSIS — I7300 Raynaud's syndrome without gangrene: Secondary | ICD-10-CM | POA: Diagnosis not present

## 2024-07-29 DIAGNOSIS — L930 Discoid lupus erythematosus: Secondary | ICD-10-CM | POA: Diagnosis not present

## 2024-08-02 ENCOUNTER — Telehealth: Payer: Self-pay | Admitting: Physician Assistant

## 2024-08-02 ENCOUNTER — Encounter: Payer: Self-pay | Admitting: Physician Assistant

## 2024-08-02 ENCOUNTER — Ambulatory Visit (INDEPENDENT_AMBULATORY_CARE_PROVIDER_SITE_OTHER): Admitting: Physician Assistant

## 2024-08-02 VITALS — BP 110/72 | HR 96 | Temp 98.0°F | Resp 16 | Ht 66.0 in | Wt 222.2 lb

## 2024-08-02 DIAGNOSIS — R7303 Prediabetes: Secondary | ICD-10-CM

## 2024-08-02 DIAGNOSIS — G4733 Obstructive sleep apnea (adult) (pediatric): Secondary | ICD-10-CM

## 2024-08-02 DIAGNOSIS — I10 Essential (primary) hypertension: Secondary | ICD-10-CM

## 2024-08-02 NOTE — Progress Notes (Signed)
 [facility] [address] [place], [place] [address]  Internal MEDICINE  Office Visit Note  Patient Name: Morgan Kent  [id number]  [id number]  Date of Service: 08/02/2024  Chief Complaint  Patient presents with   Follow-up   Hypertension   Depression    HPI Pt is here for routine follow up -BP improved -down 4lbs since last visit -still in the process of moving -hematology scheduled for Oct 3 to evaluate polychromasia and atypical lymphocytes seen on previous bloodwork -trying to improve diet, increase water  to help reduce sugars -having trouble with cpap supplies, called [facility] 1-2 weeks ago and hasn't heard back still. Hasn't been using machine as regularly due to old supplies and seeing mold. She did aggressively clean them and this helped, but still needs new supplies.  Current Medication: Outpatient Encounter Medications as of 08/02/2024  Medication Sig   albuterol  (VENTOLIN  HFA) 108 (90 Base) MCG/ACT inhaler INHALE 2 PUFFS INTO THE LUNGS DAILY   budeson-glycopyrrolate -formoterol (BREZTRI  AEROSPHERE) 160-9-4.8 MCG/ACT AERO inhaler INHALE 2 PUFFS BY MOUTH INTO THE LUNGS TWICE DAILY   carvedilol  (COREG ) 25 MG tablet TAKE 1 TABLET(25 MG) BY MOUTH TWICE DAILY   cetirizine  (ZYRTEC ) 10 MG tablet Take 1 tablet (10 mg total) by mouth daily.   clobetasol  ointment (TEMOVATE ) 0.05 % Apply 1 Application topically as directed. qd up to 5 days a week to lupus on arms as needed for flares, avoid face, groin, axilla   clotrimazole  (MYCELEX ) 10 MG troche TAKE 1 BY MOUTH EVERY DAY, SUCH ON ONE TROCHE UNTIL DISSOLVED   Cyanocobalamin  (B-12 COMPLIANCE INJECTION) 1000 MCG/ML KIT Inject as directed.   cyclobenzaprine  (FLEXERIL ) 10 MG tablet Take 1 tablet (10 mg total) by mouth 3 (three) times daily as needed.   Dapsone  7.5 % GEL APPLY TOPICALLY TO THE AFFECTED AREA EVERY DAY   diclofenac  (VOLTAREN ) 50 MG EC tablet Take 50 mg by mouth 2 (two) times daily.   diclofenac  Sodium (VOLTAREN )  1 % GEL APPLY 2 GRAMS TOPICALLY TO THE AFFECTED AREA FOUR TIMES DAILY   empagliflozin  (JARDIANCE ) 10 MG TABS tablet Take 1 tablet (10 mg total) by mouth daily before breakfast.   FEROSUL 325 (65 Fe) MG tablet TAKE 1 TABLET BY MOUTH DAILY WITH BREAKFAST   fluticasone  (FLONASE ) 50 MCG/ACT nasal spray USE 1 SPRAY IN RIGHT NOSTRIL AT BEDTIME   folic acid  (FOLVITE ) 1 MG tablet Take 1 mg by mouth daily.   gabapentin  (NEURONTIN ) 300 MG capsule TAKE 1 CAPSULE(300 MG) BY MOUTH THREE TIMES DAILY   hydroxychloroquine  (PLAQUENIL ) 200 MG tablet Take 1 tablet (200 mg total) by mouth daily.   ketoconazole  (NIZORAL ) 2 % shampoo Apply topically once a week.   methotrexate (RHEUMATREX) 2.5 MG tablet Take 2.5 mg by mouth once a week. Caution:Chemotherapy. Protect from light.   metoprolol  tartrate (LOPRESSOR ) 100 MG tablet Take 1 tablet (100 mg total) by mouth once for 1 dose. 2 hours prior to the CT   mometasone  (ELOCON ) 0.1 % lotion Apply topically as directed. Qd up to 5 days a week to affected area of scalp as needed for flares   mometasone  (ELOCON ) 0.1 % ointment APPLY TOPICALLY TO SORES ON FACE ONCE DAILY FOR UP TO 5 DAYS PER WEEK   montelukast  (SINGULAIR ) 10 MG tablet Take 1 tablet (10 mg total) by mouth at bedtime.   mupirocin  ointment (BACTROBAN ) 2 % Apply 1 Application topically daily. Qd to open sores face, body prn flares   NON FORMULARY CPAP with [facility]  norethindrone -ethinyl estradiol-iron (AUROVELA FE 1.5/30) 1.5-30 MG-MCG tablet Take 1 tablet by mouth daily.   oxyCODONE -acetaminophen  (PERCOCET) 7.5-325 MG tablet Take 1 tablet by mouth every 4 (four) hours as needed for severe pain.   Polyethyl Glycol-Propyl Glycol (SYSTANE ULTRA) 0.4-0.3 % SOLN Place 1 drop into both eyes daily as needed (for dry eyes).    sacubitril-valsartan (ENTRESTO ) 24-26 MG Take 1 tablet by mouth 2 (two) times daily.   tacrolimus  (PROGRAF ) 1 MG capsule OPEN 1 CAPSULE AND DISSOLVE INTO A HALF LITER OF WATER . SWISH AROUND FOR 2 MINS  THEN SPIT TWICE DAILY   tacrolimus  (PROTOPIC ) 0.1 % ointment APPLY TOPICALLY TWICE DAILY TO CORNERS OF MOUTH AS NEEDED FOR FLARES.   Vitamin D , Ergocalciferol , (DRISDOL ) 1.25 MG (50000 UNIT) CAPS capsule TAKE 1 CAPSULE BY MOUTH EVERY WEEK   No facility-administered encounter medications on file as of 08/02/2024.    Surgical History: Past Surgical History:  Procedure Laterality Date   carpel tunnel  Left 2014   CRANIOTOMY  14,06   chiari   DENTAL SURGERY     HERNIA REPAIR     umbilical   PLACEMENT OF LUMBAR DRAIN N/A 07/07/2014   Procedure: PLACEMENT OF LUMBAR DRAIN AND CLOSURE OF CERVICAL INCISION;  Surgeon: Reyes Budge, MD;  Location: [facility];  Service: Neurosurgery;  Laterality: N/A;   SUBOCCIPITAL CRANIECTOMY CERVICAL LAMINECTOMY N/A 09/29/2013   Procedure: SUBOCCIPITAL CRANIECTOMY CERVICAL LAMINECTOMY/DURAPLASTY;  Surgeon: Reyes JONETTA Budge, MD;  Location: [facility];  Service: Neurosurgery;  Laterality: N/A;  posterior   SUBOCCIPITAL CRANIECTOMY CERVICAL LAMINECTOMY N/A 06/23/2014   Procedure: SUBOCCIPITAL CRANIECTOMY CERVICAL LAMINECTOMY/DURAPLASTY;  Surgeon: Reyes Budge, MD;  Location: [facility];  Service: Neurosurgery;  Laterality: N/A;  suboccipital craniectomy with cervical laminectomy and duraplasty    Medical History: Past Medical History:  Diagnosis Date   Anxiety    Arthritis    rheumo possible   Chiari malformation    Depression    Dizziness    Headache(784.0)    History of kidney stones    Hypertension    Lupus    Lupus    Nerve damage    NECK   PONV (postoperative nausea and vomiting)    nausea only   Sleep apnea    TMJ arthritis     Family History: Family History  Problem Relation Age of Onset   Hypertension Mother    Thyroid  disease Mother    Heart disease Father    Hypertension Father    Breast cancer Other 58 - 55       maternal great grandmaw   Asthma Other    Depression Other    Diabetes Other    Migraines Other     Stroke Other     Social History   Socioeconomic History   Marital status: Single    Spouse name: Not on file   Number of children: Not on file   Years of education: Not on file   Highest education level: Not on file  Occupational History   Not on file  Tobacco Use   Smoking status: Never   Smokeless tobacco: Never  Vaping Use   Vaping status: Never Used  Substance and Sexual Activity   Alcohol  use: No    Alcohol /week: 0.0 standard drinks of alcohol    Drug use: No   Sexual activity: Not on file  Other Topics Concern   Not on file  Social History Narrative   Lives with 3 children in 2 story home.  Unemployed.  Trying to get disability.  Education: high school.   Social Drivers of Corporate investment banker Strain: Not on file  Food Insecurity: Not on file  Transportation Needs: Not on file  Physical Activity: Not on file  Stress: Not on file  Social Connections: Not on file  Intimate Partner Violence: Not on file      Review of Systems  Constitutional:  Negative for chills, fatigue, fever and unexpected weight change.  HENT:  Negative for congestion, mouth sores, postnasal drip, rhinorrhea, sneezing and sore throat.   Eyes:  Negative for redness.  Respiratory:  Negative for cough, chest tightness and shortness of breath.   Cardiovascular:  Negative for chest pain and palpitations.  Gastrointestinal:  Negative for abdominal pain, constipation, diarrhea, nausea and vomiting.  Genitourinary:  Negative for dysuria, flank pain and frequency.  Musculoskeletal:  Positive for arthralgias. Negative for back pain, joint swelling and neck pain.  Skin:  Negative for rash.  Neurological: Negative.  Negative for tremors and numbness.  Hematological:  Negative for adenopathy. Does not bruise/bleed easily.  Psychiatric/Behavioral: Negative.  Negative for behavioral problems (Depression), sleep disturbance and suicidal ideas. The patient is not nervous/anxious.     Vital  Signs: BP 110/72   Pulse 96   Temp 98 F (36.7 C)   Resp 16   Ht 5' 6 (1.676 m)   Wt 222 lb 3.2 oz (100.8 kg)   SpO2 97%   BMI 35.86 kg/m    Physical Exam Vitals and nursing note reviewed.  Constitutional:      General: She is not in acute distress.    Appearance: Normal appearance. She is obese. She is not ill-appearing.  HENT:     Head: Normocephalic and atraumatic.  Eyes:     Pupils: Pupils are equal, round, and reactive to light.  Cardiovascular:     Rate and Rhythm: Normal rate and regular rhythm.     Heart sounds: Normal heart sounds. No murmur heard. Pulmonary:     Effort: Pulmonary effort is normal. No respiratory distress.     Breath sounds: Normal breath sounds. No wheezing.  Skin:    General: Skin is warm and dry.  Neurological:     Mental Status: She is alert and oriented to person, place, and time.  Psychiatric:        Mood and Affect: Mood normal.        Behavior: Behavior normal.        Assessment/Plan: 1. Essential hypertension (Primary) Stable, continue current medication  2. Prediabetes Continue Jardiance  and will improve diet, check A1c next visit  3. OSA on CPAP Continue cpap nightly, will follow up on supply issues   General Counseling: Sarahmarie verbalizes understanding of the findings of todays visit and agrees with plan of treatment. I have discussed any further diagnostic evaluation that may be needed or ordered today. We also reviewed her medications today. she has been encouraged to call the office with any questions or concerns that should arise related to todays visit.    No orders of the defined types were placed in this encounter.   No orders of the defined types were placed in this encounter.   This patient was seen by Tinnie Pro, PA-C in collaboration with Dr. Sigrid Bathe as a part of collaborative care agreement.   Total time spent:30 Minutes Time spent includes review of chart, medications, test results, and  follow up plan with the patient.      Dr Fozia M  [facility] Internal medicine

## 2024-08-02 NOTE — Telephone Encounter (Signed)
 Patient here in office. Stated she still has not heard anything from [facility] about her supplies. I called [facility]. Had her on speaker phone with patient. She stated she is sending email to upper authority regarding issues. She instructed patient to stop cleaning her supplies with bleach and only use Dawn dish detergent. Per [facility] request, I faxed 10/2021 - 01/2022 office notes to [facility]. [facility] will try to get supplies to patient-[name]

## 2024-08-04 ENCOUNTER — Ambulatory Visit: Admitting: Internal Medicine

## 2024-08-04 DIAGNOSIS — J449 Chronic obstructive pulmonary disease, unspecified: Secondary | ICD-10-CM

## 2024-08-04 DIAGNOSIS — J4541 Moderate persistent asthma with (acute) exacerbation: Secondary | ICD-10-CM

## 2024-08-04 DIAGNOSIS — R0602 Shortness of breath: Secondary | ICD-10-CM

## 2024-08-10 ENCOUNTER — Ambulatory Visit: Attending: Medical | Admitting: Medical

## 2024-08-10 VITALS — BP 133/85 | HR 96 | Ht 66.0 in | Wt 223.2 lb

## 2024-08-10 DIAGNOSIS — R9431 Abnormal electrocardiogram [ECG] [EKG]: Secondary | ICD-10-CM | POA: Insufficient documentation

## 2024-08-10 DIAGNOSIS — Z79899 Other long term (current) drug therapy: Secondary | ICD-10-CM | POA: Insufficient documentation

## 2024-08-10 DIAGNOSIS — I5022 Chronic systolic (congestive) heart failure: Secondary | ICD-10-CM | POA: Insufficient documentation

## 2024-08-10 DIAGNOSIS — I428 Other cardiomyopathies: Secondary | ICD-10-CM | POA: Diagnosis not present

## 2024-08-10 DIAGNOSIS — I10 Essential (primary) hypertension: Secondary | ICD-10-CM | POA: Insufficient documentation

## 2024-08-10 MED ORDER — SPIRONOLACTONE 25 MG PO TABS: 12.5000 mg | ORAL_TABLET | Freq: Every day | ORAL | 3 refills | Status: AC

## 2024-08-10 NOTE — Patient Instructions (Signed)
 Medication Instructions:  Your physician recommends the following medication changes.  START TAKING: Spironolactone 1/2 tablet (12.5 mg) by mouth daily    *If you need a refill on your cardiac medications before your next appointment, please call your pharmacy*  Lab Work: Your provider would like for you to return in 2 weeks to have the following labs drawn: BMP.   Please go to [facility] [address] ([facility] [id number], [place] [address] You do not need an appointment.  They are open from 8 am- 4:30 pm.  Lunch from 1:00 pm- 2:00 pm You will not need to be fasting.    Testing/Procedures: No test ordered today   Follow-Up: At [facility], you and your health needs are our priority.  As part of our continuing mission to provide you with exceptional heart care, our providers are all part of one team.  This team includes your primary Cardiologist (physician) and Advanced Practice Providers or APPs (Physician Assistants and Nurse Practitioners) who all work together to provide you with the care you need, when you need it.  Your next appointment:   6 month(s)  Provider:   Timothy Gollan, MD or Cadence Franchester, PA-C

## 2024-08-10 NOTE — Progress Notes (Signed)
 Cardiology Office Note   Date:  08/10/2024  ID:  Lezli, Danek Jan 19, 1981, MRN [id number] PCP: Kristina Tinnie POUR, PA-C  [facility] Providers Cardiologist:  Evalene Lunger, MD    History of Present Illness Morgan Kent is a 43 y.o. female with a hx of morbid obesity, lupus, PVCs, HTN, HFmrEF, NICM, OSA on nasal CPAP who presents for 2 month follow-up for CHF.   Echo 07/2022 showed normal LVEF and normal valves.The patient was last seen 11/19/23 reporting chest pain and DOE. Echo showed mildly reduced LVEF 45-50%, no WMA, normal RVSF, mild MR. Cardiac CTA showed no CAD.  Cardiac MRI was ordered and she was started on Jardiance .cMRI showed normal LV size and systolic function of 53%, no LGE or scar, no evidence of infiltrative or inflammatory disease, normal RV function, no significant valvular abnormalities.  Patient was last seen 05/04/2024 and was overall stable from a cardiac perspective.  Today, the patient reports she is doing well. She was referred to cancer center to evaluate polychromasia and atypical lymphocytes. She is prediabetic. She denies chest pain. When she overexerts herself she does feel SOB. No lower leg edema. She uses her CPAP every night.   Studies Reviewed EKG Interpretation Date/Time:  Tuesday August 10 2024 08:48:07 EDT Ventricular Rate:  96 PR Interval:  156 QRS Duration:  84 QT Interval:  348 QTC Calculation: 439 R Axis:   17  Text Interpretation: Normal sinus rhythm T wave abnormality, consider lateral ischemia When compared with ECG of 31-Dec-2023 11:03, Nonspecific T wave abnormality, worse in Inferior leads Inverted T waves have replaced nonspecific T wave abnormality in Lateral leads Confirmed by Franchester, Antonio Creswell ([id number]) on 08/10/2024 8:55:29 AM    cMRI 03/2024 IMPRESSION: 1.  Normal LV size and systolic function.  LVEF 53%   2.  No LGE or scar.   3.  No evidence for infiltrative or inflammatory disease.   4.  Normal RV function.   5.   No significant valvular abnormalities.   Echo 12/2023 1. Left ventricular ejection fraction, by estimation, is 45 to 50%. The  left ventricle has mildly decreased function. The left ventricle has no  regional wall motion abnormalities. Left ventricular diastolic parameters  are indeterminate. The average left   ventricular global longitudinal strain is -14.1 %. The global  longitudinal strain is abnormal.   2. Right ventricular systolic function is normal. The right ventricular  size is normal.   3. The mitral valve is normal in structure. Mild mitral valve  regurgitation. No evidence of mitral stenosis.   4. The aortic valve is normal in structure. Aortic valve regurgitation is  not visualized. No aortic stenosis is present.   5. The inferior vena cava is normal in size with greater than 50%  respiratory variability, suggesting right atrial pressure of 3 mmHg.      Cardiac CTA 12/2023  IMPRESSION: 1. Coronary calcium score of 0.   2. Normal coronary origin with right dominance.   3. No evidence of CAD.   4. CAD-RADS 0. Consider non-atherosclerotic causes of chest pain.       Physical Exam VS:  BP 133/85 (BP Location: Left Arm, Patient Position: Sitting, Cuff Size: Normal)   Pulse 96   Ht 5' 6 (1.676 m)   Wt 223 lb 3.2 oz (101.2 kg)   SpO2 98%   BMI 36.03 kg/m        Wt Readings from Last 3 Encounters:  08/10/24 223 lb 3.2 oz (101.2  kg)  08/02/24 222 lb 3.2 oz (100.8 kg)  07/01/24 226 lb (102.5 kg)    GEN: Well nourished, well developed in no acute distress NECK: No JVD; No carotid bruits CARDIAC: RRR, no murmurs, rubs, gallops RESPIRATORY:  Clear to auscultation without rales, wheezing or rhonchi  ABDOMEN: Soft, non-tender, non-distended EXTREMITIES:  No edema; No deformity   ASSESSMENT AND PLAN  Chronic HFmrEF Echo in 12/2023 showed LVEF 45-50%. Cardiac CTA showed no CAD. cMRI showed LVEF 53%, no inflammatory disease. The patient is euvolemic on exam. Continue  Coreg  25mg , Entresto  24-26mg BID, Jardiance  10mg  daily. I will start spironolactone 12.5mg  daily. BMET in 2 weeks.   HTN BP today is better. Add spironolactone as above. Continue current medications.   EKG changes EKG shows NST with TWI lateral leads. Prior cardiac CTA showed no CAD. No anginal symptoms reported. May be from lead placement. Re-check at follow-up.        Dispo: Follow-up in 6 months  Signed, Twinkle Sockwell VEAR Fishman, PA-C

## 2024-08-11 ENCOUNTER — Encounter: Admitting: Internal Medicine

## 2024-08-13 ENCOUNTER — Other Ambulatory Visit: Payer: Self-pay | Admitting: Dermatology

## 2024-08-13 ENCOUNTER — Inpatient Hospital Stay: Attending: Oncology | Admitting: Oncology

## 2024-08-13 ENCOUNTER — Encounter: Payer: Self-pay | Admitting: Oncology

## 2024-08-13 ENCOUNTER — Inpatient Hospital Stay

## 2024-08-13 ENCOUNTER — Ambulatory Visit: Admitting: Oncology

## 2024-08-13 VITALS — BP 114/90 | HR 94 | Temp 98.9°F | Resp 18 | Ht 66.0 in | Wt 223.0 lb

## 2024-08-13 DIAGNOSIS — G4730 Sleep apnea, unspecified: Secondary | ICD-10-CM | POA: Diagnosis not present

## 2024-08-13 DIAGNOSIS — I10 Essential (primary) hypertension: Secondary | ICD-10-CM | POA: Insufficient documentation

## 2024-08-13 DIAGNOSIS — Z7984 Long term (current) use of oral hypoglycemic drugs: Secondary | ICD-10-CM | POA: Insufficient documentation

## 2024-08-13 DIAGNOSIS — Z79631 Long term (current) use of antimetabolite agent: Secondary | ICD-10-CM | POA: Diagnosis not present

## 2024-08-13 DIAGNOSIS — Z7951 Long term (current) use of inhaled steroids: Secondary | ICD-10-CM | POA: Diagnosis not present

## 2024-08-13 DIAGNOSIS — Z79899 Other long term (current) drug therapy: Secondary | ICD-10-CM | POA: Insufficient documentation

## 2024-08-13 DIAGNOSIS — Z791 Long term (current) use of non-steroidal anti-inflammatories (NSAID): Secondary | ICD-10-CM | POA: Diagnosis not present

## 2024-08-13 DIAGNOSIS — Z79621 Long term (current) use of calcineurin inhibitor: Secondary | ICD-10-CM | POA: Insufficient documentation

## 2024-08-13 DIAGNOSIS — Z803 Family history of malignant neoplasm of breast: Secondary | ICD-10-CM | POA: Insufficient documentation

## 2024-08-13 DIAGNOSIS — D7289 Other specified disorders of white blood cells: Secondary | ICD-10-CM

## 2024-08-13 DIAGNOSIS — D72819 Decreased white blood cell count, unspecified: Secondary | ICD-10-CM | POA: Diagnosis not present

## 2024-08-13 DIAGNOSIS — K130 Diseases of lips: Secondary | ICD-10-CM

## 2024-08-13 DIAGNOSIS — L700 Acne vulgaris: Secondary | ICD-10-CM

## 2024-08-13 LAB — CBC WITH DIFFERENTIAL/PLATELET
Abs Immature Granulocytes: 0.01 K/uL (ref 0.00–0.07)
Basophils Absolute: 0 K/uL (ref 0.0–0.1)
Basophils Relative: 1 %
Eosinophils Absolute: 0.1 K/uL (ref 0.0–0.5)
Eosinophils Relative: 2 %
HCT: 36 % (ref 36.0–46.0)
Hemoglobin: 12.2 g/dL (ref 12.0–15.0)
Immature Granulocytes: 0 %
Lymphocytes Relative: 38 %
Lymphs Abs: 1.4 K/uL (ref 0.7–4.0)
MCH: 31.3 pg (ref 26.0–34.0)
MCHC: 33.9 g/dL (ref 30.0–36.0)
MCV: 92.3 fL (ref 80.0–100.0)
Monocytes Absolute: 0.4 K/uL (ref 0.1–1.0)
Monocytes Relative: 11 %
Neutro Abs: 1.9 K/uL (ref 1.7–7.7)
Neutrophils Relative %: 48 %
Platelets: 277 K/uL (ref 150–400)
RBC: 3.9 MIL/uL (ref 3.87–5.11)
RDW: 13.3 % (ref 11.5–15.5)
Smear Review: NORMAL
WBC: 3.8 K/uL — ABNORMAL LOW (ref 4.0–10.5)
nRBC: 0 % (ref 0.0–0.2)

## 2024-08-13 NOTE — Progress Notes (Signed)
 [facility]  [phone] [phone]  ID: Morgan Kent OB: [birth year]  MR#: [id number]  RDW#:[id number]  Patient Care Team: Kristina Tinnie MARLA DEVONNA as PCP - General (Physician Assistant) Perla Evalene PARAS, MD as PCP - Cardiology (Cardiology) Perla Evalene PARAS, MD as Consulting Physician (Cardiology)  CHIEF COMPLAINT: Atypical lymphocytes.  INTERVAL HISTORY: Patient is a 43 year old female who was last seen in clinic from a different provider who is referred back after noting atypical lymphocytes on recent blood draw.  She currently feels well and is asymptomatic.  She is tolerating Plaquenil  for her lupus without significant side effects.  She has no neurologic complaints.  She denies any recent fevers or illnesses.  She has a good appetite and denies weight loss.  She has no chest pain, shortness of breath, cough, or hemoptysis.  She denies any nausea, vomiting, constipation, or diarrhea.  She has no urinary complaints.  Patient offers no further specific complaints today.  REVIEW OF SYSTEMS:   Review of Systems  Constitutional: Negative.  Negative for fever, malaise/fatigue and weight loss.  Respiratory: Negative.  Negative for cough, hemoptysis and shortness of breath.   Cardiovascular: Negative.  Negative for chest pain and leg swelling.  Gastrointestinal: Negative.  Negative for abdominal pain.  Genitourinary: Negative.  Negative for dysuria.  Musculoskeletal: Negative.  Negative for back pain.  Skin: Negative.  Negative for rash.  Neurological: Negative.  Negative for dizziness, focal weakness, weakness and headaches.  Psychiatric/Behavioral: Negative.  The patient is not nervous/anxious.     As per HPI. Otherwise, a complete review of systems is negative.  PAST MEDICAL HISTORY: Past Medical History:  Diagnosis Date   Anxiety    Arthritis    rheumo possible   Chiari malformation    Depression    Dizziness    Headache(784.0)     History of kidney stones    Hypertension    Lupus    Lupus    Nerve damage    NECK   PONV (postoperative nausea and vomiting)    nausea only   Sleep apnea    TMJ arthritis     PAST SURGICAL HISTORY: Past Surgical History:  Procedure Laterality Date   carpel tunnel  Left 2014   CRANIOTOMY  14,06   chiari   DENTAL SURGERY     HERNIA REPAIR     umbilical   PLACEMENT OF LUMBAR DRAIN N/A 07/07/2014   Procedure: PLACEMENT OF LUMBAR DRAIN AND CLOSURE OF CERVICAL INCISION;  Surgeon: Reyes Budge, MD;  Location: [facility];  Service: Neurosurgery;  Laterality: N/A;   SUBOCCIPITAL CRANIECTOMY CERVICAL LAMINECTOMY N/A 09/29/2013   Procedure: SUBOCCIPITAL CRANIECTOMY CERVICAL LAMINECTOMY/DURAPLASTY;  Surgeon: Reyes JONETTA Budge, MD;  Location: [facility];  Service: Neurosurgery;  Laterality: N/A;  posterior   SUBOCCIPITAL CRANIECTOMY CERVICAL LAMINECTOMY N/A 06/23/2014   Procedure: SUBOCCIPITAL CRANIECTOMY CERVICAL LAMINECTOMY/DURAPLASTY;  Surgeon: Reyes Budge, MD;  Location: [facility];  Service: Neurosurgery;  Laterality: N/A;  suboccipital craniectomy with cervical laminectomy and duraplasty    FAMILY HISTORY: Family History  Problem Relation Age of Onset   Hypertension Mother    Thyroid  disease Mother    Heart disease Father    Hypertension Father    Breast cancer Other 5 - 36       maternal great grandmaw   Asthma Other    Depression Other    Diabetes Other    Migraines Other    Stroke Other     ADVANCED DIRECTIVES (Y/N):  N  HEALTH MAINTENANCE: Social History   Tobacco Use   Smoking status: Never   Smokeless tobacco: Never  Vaping Use   Vaping status: Never Used  Substance Use Topics   Alcohol  use: No    Alcohol /week: 0.0 standard drinks of alcohol    Drug use: No     Colonoscopy:  PAP:  Bone density:  Lipid panel:  Allergies  Allergen Reactions   Misc. Sulfonamide Containing Compounds    Other Other (See Comments)   Sulfa  Antibiotics     Cephalexin Itching    Per [facility] records    Current Outpatient Medications  Medication Sig Dispense Refill   albuterol  (VENTOLIN  HFA) 108 (90 Base) MCG/ACT inhaler INHALE 2 PUFFS INTO THE LUNGS DAILY 18 g 5   amLODipine  (NORVASC ) 5 MG tablet Take 5 mg by mouth daily.     budeson-glycopyrrolate -formoterol (BREZTRI  AEROSPHERE) 160-9-4.8 MCG/ACT AERO inhaler INHALE 2 PUFFS BY MOUTH INTO THE LUNGS TWICE DAILY 32.1 g 4   carvedilol  (COREG ) 25 MG tablet TAKE 1 TABLET(25 MG) BY MOUTH TWICE DAILY 180 tablet 3   cetirizine  (ZYRTEC ) 10 MG tablet Take 1 tablet (10 mg total) by mouth daily. 30 tablet 5   clobetasol  ointment (TEMOVATE ) 0.05 % Apply 1 Application topically as directed. qd up to 5 days a week to lupus on arms as needed for flares, avoid face, groin, axilla 45 g 5   clotrimazole  (MYCELEX ) 10 MG troche TAKE 1 BY MOUTH EVERY DAY, SUCH ON ONE TROCHE UNTIL DISSOLVED 30 Troche 5   Cyanocobalamin  (B-12 COMPLIANCE INJECTION) 1000 MCG/ML KIT Inject as directed.     cyclobenzaprine  (FLEXERIL ) 10 MG tablet Take 1 tablet (10 mg total) by mouth 3 (three) times daily as needed. 30 tablet 1   Dapsone  7.5 % GEL APPLY TOPICALLY TO THE AFFECTED AREA EVERY DAY 180 g 3   diclofenac  (VOLTAREN ) 50 MG EC tablet Take 50 mg by mouth 2 (two) times daily.     diclofenac  Sodium (VOLTAREN ) 1 % GEL APPLY 2 GRAMS TOPICALLY TO THE AFFECTED AREA FOUR TIMES DAILY 300 g 1   empagliflozin  (JARDIANCE ) 10 MG TABS tablet Take 1 tablet (10 mg total) by mouth daily before breakfast. 90 tablet 3   FEROSUL 325 (65 Fe) MG tablet TAKE 1 TABLET BY MOUTH DAILY WITH BREAKFAST (Patient taking differently: Take 325 mg by mouth daily as needed.) 90 tablet 1   fluticasone  (FLONASE ) 50 MCG/ACT nasal spray USE 1 SPRAY IN RIGHT NOSTRIL AT BEDTIME 48 g 3   folic acid  (FOLVITE ) 1 MG tablet Take 1 mg by mouth daily.     gabapentin  (NEURONTIN ) 300 MG capsule TAKE 1 CAPSULE(300 MG) BY MOUTH THREE TIMES DAILY 270 capsule 1   hydroxychloroquine   (PLAQUENIL ) 200 MG tablet Take 1 tablet (200 mg total) by mouth daily. 30 tablet 2   ketoconazole  (NIZORAL ) 2 % shampoo Apply topically once a week. 360 mL 6   methotrexate (RHEUMATREX) 2.5 MG tablet Take 2.5 mg by mouth once a week. Caution:Chemotherapy. Protect from light.     mometasone  (ELOCON ) 0.1 % lotion Apply topically as directed. Qd up to 5 days a week to affected area of scalp as needed for flares 60 mL 5   mometasone  (ELOCON ) 0.1 % ointment APPLY TOPICALLY TO SORES ON FACE ONCE DAILY FOR UP TO 5 DAYS PER WEEK 45 g 0   montelukast  (SINGULAIR ) 10 MG tablet Take 1 tablet (10 mg total) by mouth at bedtime. 90 tablet 3   mupirocin  ointment (BACTROBAN )  2 % Apply 1 Application topically daily. Qd to open sores face, body prn flares 22 g 5   NON FORMULARY CPAP with AHP     norethindrone -ethinyl estradiol-iron (AUROVELA FE 1.5/30) 1.5-30 MG-MCG tablet Take 1 tablet by mouth daily. 28 tablet 11   oxyCODONE -acetaminophen  (PERCOCET) 7.5-325 MG tablet Take 1 tablet by mouth every 4 (four) hours as needed for severe pain.     Polyethyl Glycol-Propyl Glycol (SYSTANE ULTRA) 0.4-0.3 % SOLN Place 1 drop into both eyes daily as needed (for dry eyes).      sacubitril-valsartan (ENTRESTO ) 24-26 MG Take 1 tablet by mouth 2 (two) times daily. 180 tablet 3   spironolactone (ALDACTONE) 25 MG tablet Take 0.5 tablets (12.5 mg total) by mouth daily. 45 tablet 3   tacrolimus  (PROGRAF ) 1 MG capsule OPEN 1 CAPSULE AND DISSOLVE INTO A HALF LITER OF WATER . SWISH AROUND FOR 2 MINS THEN SPIT TWICE DAILY 60 capsule 5   tacrolimus  (PROTOPIC ) 0.1 % ointment APPLY TOPICALLY TWICE DAILY TO CORNERS OF MOUTH AS NEEDED FOR FLARES. 30 g 1   Vitamin D , Ergocalciferol , (DRISDOL ) 1.25 MG (50000 UNIT) CAPS capsule TAKE 1 CAPSULE BY MOUTH EVERY WEEK 12 capsule 3   No current facility-administered medications for this visit.    OBJECTIVE: Vitals:   08/13/24 1004  BP: (!) 114/90  Pulse: 94  Resp: 18  Temp: 98.9 F (37.2 C)   SpO2: 99%     Body mass index is 35.99 kg/m.    ECOG FS:0 - Asymptomatic  General: Well-developed, well-nourished, no acute distress. Eyes: Pink conjunctiva, anicteric sclera. HEENT: Normocephalic, moist mucous membranes. Lungs: No audible wheezing or coughing. Heart: Regular rate and rhythm. Abdomen: Soft, nontender, no obvious distention. Musculoskeletal: No edema, cyanosis, or clubbing. Neuro: Alert, answering all questions appropriately. Cranial nerves grossly intact. Skin: No rashes or petechiae noted. Psych: Normal affect. Lymphatics: No cervical, calvicular, axillary or inguinal LAD.   LAB RESULTS:  Lab Results  Component Value Date   NA 138 03/19/2024   K 4.4 03/19/2024   CL 103 03/19/2024   CO2 18 (L) 03/19/2024   GLUCOSE 124 (H) 03/19/2024   BUN 6 03/19/2024   CREATININE 0.93 03/19/2024   CALCIUM 9.1 03/19/2024   PROT 7.1 03/19/2024   ALBUMIN 4.0 03/19/2024   AST 12 03/19/2024   ALT 7 03/19/2024   ALKPHOS 69 03/19/2024   BILITOT <0.2 03/19/2024   GFRNONAA 56 (L) 08/19/2022   GFRAA 58 (L) 10/29/2019    Lab Results  Component Value Date   WBC 3.8 (L) 08/13/2024   NEUTROABS 1.9 08/13/2024   HGB 12.2 08/13/2024   HCT 36.0 08/13/2024   MCV 92.3 08/13/2024   PLT 277 08/13/2024     STUDIES: No results found.  ASSESSMENT: Atypical lymphocytes.  PLAN:    Atypical lymphocytes: Possibly secondary to Plaquenil  use.  Patient noted to have a decreased white blood cell count of 3.8, with normal differential.  Have ordered peripheral blood flow cytometry for completeness.  No further intervention is needed.  Patient does not require bone marrow biopsy.  Return to clinic in 3 weeks for further evaluation and discussion of her results.  I spent a total of 30 minutes reviewing chart data, face-to-face evaluation with the patient, counseling and coordination of care as detailed above.   Patient expressed understanding and was in agreement with this plan. She also  understands that She can call clinic at any time with any questions, concerns, or complaints.    March Joos J Sem Mccaughey,  MD   08/13/2024 1:35 PM

## 2024-08-13 NOTE — Progress Notes (Signed)
 Patient is having some fatigue, her hands are starting to tingle more than usual. She is really not to sure why she is back, she used to see Dr. Clista.

## 2024-08-14 DIAGNOSIS — J0101 Acute recurrent maxillary sinusitis: Secondary | ICD-10-CM | POA: Diagnosis not present

## 2024-08-16 DIAGNOSIS — L732 Hidradenitis suppurativa: Secondary | ICD-10-CM | POA: Diagnosis not present

## 2024-08-20 DIAGNOSIS — F4542 Pain disorder with related psychological factors: Secondary | ICD-10-CM | POA: Diagnosis not present

## 2024-08-20 DIAGNOSIS — M5136 Other intervertebral disc degeneration, lumbar region with discogenic back pain only: Secondary | ICD-10-CM | POA: Diagnosis not present

## 2024-08-20 DIAGNOSIS — G629 Polyneuropathy, unspecified: Secondary | ICD-10-CM | POA: Diagnosis not present

## 2024-08-20 DIAGNOSIS — Z79891 Long term (current) use of opiate analgesic: Secondary | ICD-10-CM | POA: Diagnosis not present

## 2024-08-20 DIAGNOSIS — G894 Chronic pain syndrome: Secondary | ICD-10-CM | POA: Diagnosis not present

## 2024-08-23 ENCOUNTER — Other Ambulatory Visit: Payer: Self-pay

## 2024-08-25 MED ORDER — SACUBITRIL-VALSARTAN 24-26 MG PO TABS: 1.0000 | ORAL_TABLET | Freq: Two times a day (BID) | ORAL | 3 refills | Status: AC

## 2024-08-27 ENCOUNTER — Other Ambulatory Visit: Payer: Self-pay | Admitting: Medical

## 2024-08-29 ENCOUNTER — Other Ambulatory Visit: Payer: Self-pay | Admitting: Physician Assistant

## 2024-08-29 DIAGNOSIS — J4520 Mild intermittent asthma, uncomplicated: Secondary | ICD-10-CM

## 2024-08-30 NOTE — Procedures (Signed)
 [facility] [address] [place], [address]    Complete Pulmonary Function Testing Interpretation:  FINDINGS:  The forced vital capacity is moderately decreased.  FEV1 is 1.30 L which is 48% of predicted and is severely decreased.  FEV1 FVC ratio is normal.  Total lung capacity is mildly decreased.  FRC is normal residual volume is increased.  DLCO was normal.  IMPRESSION:  This pulmonary function study is consistent with severe obstructive lung disease and mild restrictive lung disease clinical correlation is recommended.  Morgan DELENA Bathe, MD [facility] Pulmonary Critical Care Medicine Sleep Medicine

## 2024-08-31 ENCOUNTER — Ambulatory Visit (INDEPENDENT_AMBULATORY_CARE_PROVIDER_SITE_OTHER): Admitting: Internal Medicine

## 2024-08-31 ENCOUNTER — Telehealth: Payer: Self-pay | Admitting: Internal Medicine

## 2024-08-31 ENCOUNTER — Encounter: Payer: Self-pay | Admitting: Internal Medicine

## 2024-08-31 VITALS — BP 105/72 | HR 97 | Temp 98.3°F | Resp 16 | Ht 66.0 in | Wt 222.6 lb

## 2024-08-31 DIAGNOSIS — G4733 Obstructive sleep apnea (adult) (pediatric): Secondary | ICD-10-CM

## 2024-08-31 DIAGNOSIS — J4541 Moderate persistent asthma with (acute) exacerbation: Secondary | ICD-10-CM | POA: Diagnosis not present

## 2024-08-31 DIAGNOSIS — M329 Systemic lupus erythematosus, unspecified: Secondary | ICD-10-CM | POA: Diagnosis not present

## 2024-08-31 DIAGNOSIS — J984 Other disorders of lung: Secondary | ICD-10-CM | POA: Diagnosis not present

## 2024-08-31 DIAGNOSIS — R0602 Shortness of breath: Secondary | ICD-10-CM

## 2024-08-31 NOTE — Telephone Encounter (Signed)
 Lvm notifying patient of CT appointment date, arrival time, location-[name]

## 2024-08-31 NOTE — Progress Notes (Signed)
 [facility] [address] [place], [place] [address]  Pulmonary Sleep Medicine   Office Visit Note  Patient Name: Morgan Kent DOB: 1981/06/27 MRN [id number]  Date of Service: 08/31/2024  Complaints/HPI: She is doing well overall. She states she had a flare of lupus end of September. Now under better control. Breathing is at baseline. She had a PFT done shows severe reduction in her FEV1. She does not smoke or vape. She is on her inhalers and has been regular with using them. She states she will need refills for the inhalers. She states she does get short of breath with exertion  Office Spirometry Results:     ROS  General: (-) fever, (-) chills, (-) night sweats, (-) weakness Skin: (-) rashes, (-) itching,. Eyes: (-) visual changes, (-) redness, (-) itching. Nose and Sinuses: (-) nasal stuffiness or itchiness, (-) postnasal drip, (-) nosebleeds, (-) sinus trouble. Mouth and Throat: (-) sore throat, (-) hoarseness. Neck: (-) swollen glands, (-) enlarged thyroid , (-) neck pain. Respiratory: - cough, (-) bloody sputum, + shortness of breath, - wheezing. Cardiovascular: - ankle swelling, (-) chest pain. Lymphatic: (-) lymph node enlargement. Neurologic: (-) numbness, (-) tingling. Psychiatric: (-) anxiety, (-) depression   Current Medication: Outpatient Encounter Medications as of 08/31/2024  Medication Sig   albuterol  (VENTOLIN  HFA) 108 (90 Base) MCG/ACT inhaler INHALE 2 PUFFS INTO THE LUNGS DAILY   amLODipine  (NORVASC ) 5 MG tablet Take 5 mg by mouth daily.   budeson-glycopyrrolate -formoterol (BREZTRI  AEROSPHERE) 160-9-4.8 MCG/ACT AERO inhaler INHALE 2 PUFFS BY MOUTH INTO THE LUNGS TWICE DAILY   carvedilol  (COREG ) 25 MG tablet TAKE 1 TABLET(25 MG) BY MOUTH TWICE DAILY   cetirizine  (ZYRTEC ) 10 MG tablet Take 1 tablet (10 mg total) by mouth daily.   clobetasol  ointment (TEMOVATE ) 0.05 % Apply 1 Application topically as directed. qd up to 5 days a week to lupus on  arms as needed for flares, avoid face, groin, axilla   clotrimazole  (MYCELEX ) 10 MG troche TAKE 1 BY MOUTH EVERY DAY, SUCH ON ONE TROCHE UNTIL DISSOLVED   Cyanocobalamin  (B-12 COMPLIANCE INJECTION) 1000 MCG/ML KIT Inject as directed.   cyclobenzaprine  (FLEXERIL ) 10 MG tablet Take 1 tablet (10 mg total) by mouth 3 (three) times daily as needed.   Dapsone  7.5 % GEL APPLY TOPICALLY TO FACE TWICE DAILY EVERY DAY AS DIRECTED FOR ACNE   diclofenac  (VOLTAREN ) 50 MG EC tablet Take 50 mg by mouth 2 (two) times daily.   diclofenac  Sodium (VOLTAREN ) 1 % GEL APPLY 2 GRAMS TOPICALLY TO THE AFFECTED AREA FOUR TIMES DAILY   empagliflozin  (JARDIANCE ) 10 MG TABS tablet Take 1 tablet (10 mg total) by mouth daily before breakfast.   FEROSUL 325 (65 Fe) MG tablet TAKE 1 TABLET BY MOUTH DAILY WITH BREAKFAST (Patient taking differently: Take 325 mg by mouth daily as needed.)   fluticasone  (FLONASE ) 50 MCG/ACT nasal spray USE 1 SPRAY IN RIGHT NOSTRIL AT BEDTIME   folic acid  (FOLVITE ) 1 MG tablet Take 1 mg by mouth daily.   gabapentin  (NEURONTIN ) 300 MG capsule TAKE 1 CAPSULE(300 MG) BY MOUTH THREE TIMES DAILY   hydroxychloroquine  (PLAQUENIL ) 200 MG tablet Take 1 tablet (200 mg total) by mouth daily.   ketoconazole  (NIZORAL ) 2 % shampoo Apply topically once a week.   methotrexate (RHEUMATREX) 2.5 MG tablet Take 2.5 mg by mouth once a week. Caution:Chemotherapy. Protect from light.   mometasone  (ELOCON ) 0.1 % lotion APPLY TOPICALLY AS DIRECTED ONCE DAILY FOR UP TO 5 DAYS PER WEEK  TO THE AFFECTED AREA OF SCALP AS NEEDED FOR FLARES   mometasone  (ELOCON ) 0.1 % ointment APPLY TOPICALLY TO SORES ON FACE ONCE DAILY FOR UP TO 5 DAYS PER WEEK   montelukast  (SINGULAIR ) 10 MG tablet TAKE 1 TABLET(10 MG) BY MOUTH AT BEDTIME   mupirocin  ointment (BACTROBAN ) 2 % Apply 1 Application topically daily. Qd to open sores face, body prn flares   NON FORMULARY CPAP with AHP   norethindrone -ethinyl estradiol-iron (AUROVELA FE 1.5/30) 1.5-30  MG-MCG tablet Take 1 tablet by mouth daily.   oxyCODONE -acetaminophen  (PERCOCET) 7.5-325 MG tablet Take 1 tablet by mouth every 4 (four) hours as needed for severe pain.   Polyethyl Glycol-Propyl Glycol (SYSTANE ULTRA) 0.4-0.3 % SOLN Place 1 drop into both eyes daily as needed (for dry eyes).    sacubitril-valsartan (ENTRESTO ) 24-26 MG Take 1 tablet by mouth 2 (two) times daily.   spironolactone (ALDACTONE) 25 MG tablet Take 0.5 tablets (12.5 mg total) by mouth daily.   tacrolimus  (PROGRAF ) 1 MG capsule OPEN 1 CAPSULE AND DISSOLVE INTO A HALF LITER OF WATER . SWISH AROUND FOR 2 MINS THEN SPIT TWICE DAILY   tacrolimus  (PROTOPIC ) 0.1 % ointment APPLY TOPICALLY TO THE AFFECTED AREA DAILY   Vitamin D , Ergocalciferol , (DRISDOL ) 1.25 MG (50000 UNIT) CAPS capsule TAKE 1 CAPSULE BY MOUTH EVERY WEEK   No facility-administered encounter medications on file as of 08/31/2024.    Surgical History: Past Surgical History:  Procedure Laterality Date   carpel tunnel  Left 2014   CRANIOTOMY  14,06   chiari   DENTAL SURGERY     HERNIA REPAIR     umbilical   PLACEMENT OF LUMBAR DRAIN N/A 07/07/2014   Procedure: PLACEMENT OF LUMBAR DRAIN AND CLOSURE OF CERVICAL INCISION;  Surgeon: Reyes Budge, MD;  Location: [facility];  Service: Neurosurgery;  Laterality: N/A;   SUBOCCIPITAL CRANIECTOMY CERVICAL LAMINECTOMY N/A 09/29/2013   Procedure: SUBOCCIPITAL CRANIECTOMY CERVICAL LAMINECTOMY/DURAPLASTY;  Surgeon: Reyes JONETTA Budge, MD;  Location: [facility];  Service: Neurosurgery;  Laterality: N/A;  posterior   SUBOCCIPITAL CRANIECTOMY CERVICAL LAMINECTOMY N/A 06/23/2014   Procedure: SUBOCCIPITAL CRANIECTOMY CERVICAL LAMINECTOMY/DURAPLASTY;  Surgeon: Reyes Budge, MD;  Location: [facility];  Service: Neurosurgery;  Laterality: N/A;  suboccipital craniectomy with cervical laminectomy and duraplasty    Medical History: Past Medical History:  Diagnosis Date   Anxiety    Arthritis    rheumo possible    Chiari malformation    Depression    Dizziness    Headache(784.0)    History of kidney stones    Hypertension    Lupus    Lupus    Nerve damage    NECK   PONV (postoperative nausea and vomiting)    nausea only   Sleep apnea    TMJ arthritis     Family History: Family History  Problem Relation Age of Onset   Hypertension Mother    Thyroid  disease Mother    Heart disease Father    Hypertension Father    Breast cancer Other 69 - 2       maternal great grandmaw   Asthma Other    Depression Other    Diabetes Other    Migraines Other    Stroke Other     Social History: Social History   Socioeconomic History   Marital status: Single    Spouse name: Not on file   Number of children: Not on file   Years of education: Not on file   Highest education level:  Not on file  Occupational History   Not on file  Tobacco Use   Smoking status: Never   Smokeless tobacco: Never  Vaping Use   Vaping status: Never Used  Substance and Sexual Activity   Alcohol  use: No    Alcohol /week: 0.0 standard drinks of alcohol    Drug use: No   Sexual activity: Not on file  Other Topics Concern   Not on file  Social History Narrative   Lives with 3 children in 2 story home.  Unemployed.  Trying to get disability.  Education: high school.   Social Drivers of Corporate investment banker Strain: Not on file  Food Insecurity: Not on file  Transportation Needs: Not on file  Physical Activity: Not on file  Stress: Not on file  Social Connections: Not on file  Intimate Partner Violence: Not on file    Vital Signs: Blood pressure 105/72, pulse 97, temperature 98.3 F (36.8 C), resp. rate 16, height 5' 6 (1.676 m), weight 222 lb 9.6 oz (101 kg), SpO2 100%.  Examination: General Appearance: The patient is well-developed, well-nourished, and in no distress. Skin: Gross inspection of skin unremarkable. Head: normocephalic, no gross deformities. Eyes: no gross deformities noted. ENT:  ears appear grossly normal no exudates. Neck: Supple. No thyromegaly. No LAD. Respiratory: no rhonchi npted. Cardiovascular: Normal S1 and S2 without murmur or rub. Extremities: No cyanosis. pulses are equal. Neurologic: Alert and oriented. No involuntary movements.  LABS: Recent Results (from the past 2160 hours)  CBC w/Diff/Platelet     Status: Abnormal   Collection Time: 06/30/24  2:30 PM  Result Value Ref Range   WBC 4.0 3.4 - 10.8 x10E3/uL   RBC 3.90 3.77 - 5.28 x10E6/uL    Comment: Polychromasia present   Hemoglobin 12.1 11.1 - 15.9 g/dL   Hematocrit 62.2 65.9 - 46.6 %   MCV 97 79 - 97 fL   MCH 31.0 26.6 - 33.0 pg   MCHC 32.1 31.5 - 35.7 g/dL   RDW 84.8 88.2 - 84.5 %   Platelets 325 150 - 450 x10E3/uL   Neutrophils 31 Not Estab. %   Lymphs 49 Not Estab. %    Comment: Atypical lymphocytes.   Monocytes 16 Not Estab. %   Eos 3 Not Estab. %   Basos 1 Not Estab. %   Neutrophils Absolute 1.2 (L) 1.4 - 7.0 x10E3/uL   Lymphocytes Absolute 2.0 0.7 - 3.1 x10E3/uL   Monocytes Absolute 0.6 0.1 - 0.9 x10E3/uL   EOS (ABSOLUTE) 0.1 0.0 - 0.4 x10E3/uL   Basophils Absolute 0.0 0.0 - 0.2 x10E3/uL   Immature Granulocytes 0 Not Estab. %   Immature Grans (Abs) 0.0 0.0 - 0.1 x10E3/uL   Hematology Comments: Note:     Comment: CBC met reflex criteria for review of peripheral smear by medical laboratory professional. Automated results were confirmed by smear review.   Hgb A1C w/o eAG     Status: Abnormal   Collection Time: 06/30/24  2:30 PM  Result Value Ref Range   Hgb A1c MFr Bld 6.4 (H) 4.8 - 5.6 %    Comment:          Prediabetes: 5.7 - 6.4          Diabetes: >6.4          Glycemic control for adults with diabetes: <7.0   CBC with Differential/Platelet     Status: Abnormal   Collection Time: 08/13/24 10:26 AM  Result Value Ref Range   WBC  3.8 (L) 4.0 - 10.5 K/uL   RBC 3.90 3.87 - 5.11 MIL/uL   Hemoglobin 12.2 12.0 - 15.0 g/dL   HCT 63.9 63.9 - 53.9 %   MCV 92.3 80.0 - 100.0 fL    MCH 31.3 26.0 - 34.0 pg   MCHC 33.9 30.0 - 36.0 g/dL   RDW 86.6 88.4 - 84.4 %   Platelets 277 150 - 400 K/uL   nRBC 0.0 0.0 - 0.2 %   Neutrophils Relative % 48 %   Neutro Abs 1.9 1.7 - 7.7 K/uL   Lymphocytes Relative 38 %   Lymphs Abs 1.4 0.7 - 4.0 K/uL   Monocytes Relative 11 %   Monocytes Absolute 0.4 0.1 - 1.0 K/uL   Eosinophils Relative 2 %   Eosinophils Absolute 0.1 0.0 - 0.5 K/uL   Basophils Relative 1 %   Basophils Absolute 0.0 0.0 - 0.1 K/uL   WBC Morphology MORPHOLOGY UNREMARKABLE    RBC Morphology MORPHOLOGY UNREMARKABLE    Smear Review Normal platelet morphology    Immature Granulocytes 0 %   Abs Immature Granulocytes 0.01 0.00 - 0.07 K/uL    Comment: Performed at [facility], [address], [place], [place] [address]    Radiology: US  RENAL Result Date: 04/18/2024 CLINICAL DATA:  History of kidney stones.  CKD stage 2 EXAM: RENAL / URINARY TRACT ULTRASOUND COMPLETE COMPARISON:  None Available. FINDINGS: Right Kidney: Renal measurements: 10.5 x 4.0 x 4.9 cm = volume: 109 mL. Echogenicity within normal limits. No mass or hydronephrosis visualized. Left Kidney: Renal measurements: 10.1 x 5.4 x 4.4 cm = volume: 123 mL. Echogenicity within normal limits. No mass or hydronephrosis visualized. Bladder: Appears normal for degree of bladder distention. Bilateral ureteral jets were visualized. Other: None. IMPRESSION: Normal renal ultrasound. Electronically Signed   By: Norman Gatlin M.D.   On: 04/18/2024 02:10    No results found.  No results found.  Assessment and Plan: Patient Active Problem List   Diagnosis Date Noted   Abnormal CT scan 02/18/2023   Hemorrhagic cyst of right ovary 11/26/2019   Menorrhagia with regular cycle 11/07/2019   Iron deficiency anemia 11/07/2019   Inflammatory polyarthritis (HCC) 11/07/2019   Vitamin B12 deficiency 08/03/2019   Raynaud's disease without gangrene 08/03/2019   Mild intermittent asthma without complication 06/19/2019    BMI 39.0-39.9,adult 06/19/2019   Sore throat 12/23/2018   Flu-like symptoms 12/23/2018   Arnold-Chiari syndrome (HCC) 12/08/2018   Candidiasis 12/08/2018   Body mass index 40.0-44.9, adult (HCC) 12/08/2018   Impaired fasting glucose 09/07/2018   Other fatigue 09/07/2018   Vitamin D  deficiency 09/07/2018   Encounter for annual general medical examination with abnormal findings in adult 05/28/2018   Screening for malignant neoplasm of cervix 05/28/2018   Dysuria 05/28/2018   Allergic rhinitis 04/16/2018   Acute recurrent sinusitis 03/20/2018   Tachycardia 08/20/2016   Shortness of breath 08/20/2016   Congestive dilated cardiomyopathy (HCC) 11/22/2015   Lupus 11/22/2015   Moderate obesity 11/22/2015   OSA (obstructive sleep apnea) 11/22/2015   Essential hypertension 11/22/2015   Chronic systolic CHF (congestive heart failure) (HCC) 08/21/2015   Numbness and tingling 08/04/2014   Chiari malformation 08/04/2014   Sleep disorder 08/04/2014   Postprocedural pseudomeningocele 07/07/2014   Chiari malformation type I (HCC) 06/23/2014   Carpal tunnel syndrome 02/14/2014   Depression 02/14/2014   Neuralgia 02/14/2014   Migraines 02/14/2014   Sleep apnea 02/14/2014   Systemic lupus erythematosus (HCC) 02/14/2014   Hypertension 02/14/2014  1. Moderate persistent asthma with acute exacerbation (Primary) On appropriate inhalers will continue   2. OSA on CPAP On CPAP therapy  3. Restrictive airway disease She had mild RLD and Severe obstruction. ?COP as a diagnosis. Will checl CT chest - CT Chest High Resolution; Future  4. SOB (shortness of breath) Will get on spiro   5. RA SLE She is on treatment follow up with Rheum  General Counseling: I have discussed the findings of the evaluation and examination with Elizeth.  I have also discussed any further diagnostic evaluation thatmay be needed or ordered today. Miche verbalizes understanding of the findings of todays visit. We  also reviewed her medications today and discussed drug interactions and side effects including but not limited excessive drowsiness and altered mental states. We also discussed that there is always a risk not just to her but also people around her. she has been encouraged to call the office with any questions or concerns that should arise related to todays visit.  No orders of the defined types were placed in this encounter.    Time spent: 5  I have personally obtained a history, examined the patient, evaluated laboratory and imaging results, formulated the assessment and plan and placed orders.    Elfreda DELENA Bathe, MD [facility] Pulmonary and Critical Care Sleep medicine

## 2024-09-01 LAB — PULMONARY FUNCTION TEST

## 2024-09-02 ENCOUNTER — Ambulatory Visit: Admitting: Nurse Practitioner

## 2024-09-03 ENCOUNTER — Inpatient Hospital Stay: Admitting: Oncology

## 2024-09-03 ENCOUNTER — Ambulatory Visit
Admission: RE | Admit: 2024-09-03 | Discharge: 2024-09-03 | Disposition: A | Discharge status: Home / Self Care | Source: Ambulatory Visit | Attending: Internal Medicine | Admitting: Internal Medicine

## 2024-09-03 ENCOUNTER — Encounter: Payer: Self-pay | Admitting: Oncology

## 2024-09-03 ENCOUNTER — Inpatient Hospital Stay (HOSPITAL_BASED_OUTPATIENT_CLINIC_OR_DEPARTMENT_OTHER): Admitting: Oncology

## 2024-09-03 ENCOUNTER — Inpatient Hospital Stay

## 2024-09-03 VITALS — BP 123/83 | HR 95 | Temp 98.9°F | Resp 16 | Wt 224.0 lb

## 2024-09-03 DIAGNOSIS — J439 Emphysema, unspecified: Secondary | ICD-10-CM | POA: Diagnosis not present

## 2024-09-03 DIAGNOSIS — Z79899 Other long term (current) drug therapy: Secondary | ICD-10-CM | POA: Diagnosis not present

## 2024-09-03 DIAGNOSIS — Z79621 Long term (current) use of calcineurin inhibitor: Secondary | ICD-10-CM | POA: Diagnosis not present

## 2024-09-03 DIAGNOSIS — J984 Other disorders of lung: Secondary | ICD-10-CM | POA: Insufficient documentation

## 2024-09-03 DIAGNOSIS — D72819 Decreased white blood cell count, unspecified: Secondary | ICD-10-CM | POA: Diagnosis not present

## 2024-09-03 DIAGNOSIS — D7289 Other specified disorders of white blood cells: Secondary | ICD-10-CM

## 2024-09-03 DIAGNOSIS — G4730 Sleep apnea, unspecified: Secondary | ICD-10-CM | POA: Diagnosis not present

## 2024-09-03 DIAGNOSIS — I10 Essential (primary) hypertension: Secondary | ICD-10-CM | POA: Diagnosis not present

## 2024-09-03 DIAGNOSIS — Z791 Long term (current) use of non-steroidal anti-inflammatories (NSAID): Secondary | ICD-10-CM | POA: Diagnosis not present

## 2024-09-03 DIAGNOSIS — Z7951 Long term (current) use of inhaled steroids: Secondary | ICD-10-CM | POA: Diagnosis not present

## 2024-09-03 LAB — CBC WITH DIFFERENTIAL/PLATELET
Abs Immature Granulocytes: 0.01 K/uL (ref 0.00–0.07)
Basophils Absolute: 0 K/uL (ref 0.0–0.1)
Basophils Relative: 0 %
Eosinophils Absolute: 0.1 K/uL (ref 0.0–0.5)
Eosinophils Relative: 2 %
HCT: 34.8 % — ABNORMAL LOW (ref 36.0–46.0)
Hemoglobin: 11.5 g/dL — ABNORMAL LOW (ref 12.0–15.0)
Immature Granulocytes: 0 %
Lymphocytes Relative: 26 %
Lymphs Abs: 1.1 K/uL (ref 0.7–4.0)
MCH: 31.3 pg (ref 26.0–34.0)
MCHC: 33 g/dL (ref 30.0–36.0)
MCV: 94.8 fL (ref 80.0–100.0)
Monocytes Absolute: 0.4 K/uL (ref 0.1–1.0)
Monocytes Relative: 9 %
Neutro Abs: 2.8 K/uL (ref 1.7–7.7)
Neutrophils Relative %: 63 %
Platelets: 254 K/uL (ref 150–400)
RBC: 3.67 MIL/uL — ABNORMAL LOW (ref 3.87–5.11)
RDW: 13.2 % (ref 11.5–15.5)
WBC: 4.4 K/uL (ref 4.0–10.5)
nRBC: 0 % (ref 0.0–0.2)

## 2024-09-03 NOTE — Progress Notes (Signed)
 [facility]  [phone] [phone]  ID: YOANNA JURCZYK OB: [birth year]  MR#: [id number]  RDW#:[id number]  Patient Care Team: Kristina Tinnie MARLA DEVONNA as PCP - General (Physician Assistant) Perla Evalene PARAS, MD as PCP - Cardiology (Cardiology) Perla Evalene PARAS, MD as Consulting Physician (Cardiology)  CHIEF COMPLAINT: Atypical lymphocytes.  INTERVAL HISTORY: Patient returns to clinic today for further evaluation and discussion of her laboratory results.  She continues to feel well and remains asymptomatic.  She is tolerating Plaquenil  for her lupus without significant side effects.  She has no neurologic complaints.  She denies any recent fevers or illnesses.  She has a good appetite and denies weight loss.  She has no chest pain, shortness of breath, cough, or hemoptysis.  She denies any nausea, vomiting, constipation, or diarrhea.  She has no urinary complaints.  Patient offers no specific complaints today.  REVIEW OF SYSTEMS:   Review of Systems  Constitutional: Negative.  Negative for fever, malaise/fatigue and weight loss.  Respiratory: Negative.  Negative for cough, hemoptysis and shortness of breath.   Cardiovascular: Negative.  Negative for chest pain and leg swelling.  Gastrointestinal: Negative.  Negative for abdominal pain.  Genitourinary: Negative.  Negative for dysuria.  Musculoskeletal: Negative.  Negative for back pain.  Skin: Negative.  Negative for rash.  Neurological: Negative.  Negative for dizziness, focal weakness, weakness and headaches.  Psychiatric/Behavioral: Negative.  The patient is not nervous/anxious.     As per HPI. Otherwise, a complete review of systems is negative.  PAST MEDICAL HISTORY: Past Medical History:  Diagnosis Date   Anxiety    Arthritis    rheumo possible   Chiari malformation    Depression    Dizziness    Headache(784.0)    History of kidney stones    Hypertension    Lupus    Lupus     Nerve damage    NECK   PONV (postoperative nausea and vomiting)    nausea only   Sleep apnea    TMJ arthritis     PAST SURGICAL HISTORY: Past Surgical History:  Procedure Laterality Date   carpel tunnel  Left 2014   CRANIOTOMY  14,06   chiari   DENTAL SURGERY     HERNIA REPAIR     umbilical   PLACEMENT OF LUMBAR DRAIN N/A 07/07/2014   Procedure: PLACEMENT OF LUMBAR DRAIN AND CLOSURE OF CERVICAL INCISION;  Surgeon: Reyes Budge, MD;  Location: [facility];  Service: Neurosurgery;  Laterality: N/A;   SUBOCCIPITAL CRANIECTOMY CERVICAL LAMINECTOMY N/A 09/29/2013   Procedure: SUBOCCIPITAL CRANIECTOMY CERVICAL LAMINECTOMY/DURAPLASTY;  Surgeon: Reyes JONETTA Budge, MD;  Location: [facility];  Service: Neurosurgery;  Laterality: N/A;  posterior   SUBOCCIPITAL CRANIECTOMY CERVICAL LAMINECTOMY N/A 06/23/2014   Procedure: SUBOCCIPITAL CRANIECTOMY CERVICAL LAMINECTOMY/DURAPLASTY;  Surgeon: Reyes Budge, MD;  Location: [facility];  Service: Neurosurgery;  Laterality: N/A;  suboccipital craniectomy with cervical laminectomy and duraplasty    FAMILY HISTORY: Family History  Problem Relation Age of Onset   Hypertension Mother    Thyroid  disease Mother    Heart disease Father    Hypertension Father    Breast cancer Other 25 - 57       maternal great grandmaw   Asthma Other    Depression Other    Diabetes Other    Migraines Other    Stroke Other     ADVANCED DIRECTIVES (Y/N):  N  HEALTH MAINTENANCE: Social History   Tobacco Use  Smoking status: Never   Smokeless tobacco: Never  Vaping Use   Vaping status: Never Used  Substance Use Topics   Alcohol  use: No    Alcohol /week: 0.0 standard drinks of alcohol    Drug use: No     Colonoscopy:  PAP:  Bone density:  Lipid panel:  Allergies  Allergen Reactions   Misc. Sulfonamide Containing Compounds    Other Other (See Comments)   Sulfa  Antibiotics    Cephalexin Itching    Per [facility] records    Current  Outpatient Medications  Medication Sig Dispense Refill   albuterol  (VENTOLIN  HFA) 108 (90 Base) MCG/ACT inhaler INHALE 2 PUFFS INTO THE LUNGS DAILY 18 g 5   amLODipine  (NORVASC ) 5 MG tablet Take 5 mg by mouth daily.     budeson-glycopyrrolate -formoterol (BREZTRI  AEROSPHERE) 160-9-4.8 MCG/ACT AERO inhaler INHALE 2 PUFFS BY MOUTH INTO THE LUNGS TWICE DAILY 32.1 g 4   carvedilol  (COREG ) 25 MG tablet TAKE 1 TABLET(25 MG) BY MOUTH TWICE DAILY 180 tablet 3   cetirizine  (ZYRTEC ) 10 MG tablet Take 1 tablet (10 mg total) by mouth daily. 30 tablet 5   clobetasol  ointment (TEMOVATE ) 0.05 % Apply 1 Application topically as directed. qd up to 5 days a week to lupus on arms as needed for flares, avoid face, groin, axilla 45 g 5   clotrimazole  (MYCELEX ) 10 MG troche TAKE 1 BY MOUTH EVERY DAY, SUCH ON ONE TROCHE UNTIL DISSOLVED 30 Troche 5   Cyanocobalamin  (B-12 COMPLIANCE INJECTION) 1000 MCG/ML KIT Inject as directed.     cyclobenzaprine  (FLEXERIL ) 10 MG tablet Take 1 tablet (10 mg total) by mouth 3 (three) times daily as needed. 30 tablet 1   Dapsone  7.5 % GEL APPLY TOPICALLY TO FACE TWICE DAILY EVERY DAY AS DIRECTED FOR ACNE 60 g 2   diclofenac  (VOLTAREN ) 50 MG EC tablet Take 50 mg by mouth 2 (two) times daily.     diclofenac  Sodium (VOLTAREN ) 1 % GEL APPLY 2 GRAMS TOPICALLY TO THE AFFECTED AREA FOUR TIMES DAILY 300 g 1   empagliflozin  (JARDIANCE ) 10 MG TABS tablet Take 1 tablet (10 mg total) by mouth daily before breakfast. 90 tablet 3   FEROSUL 325 (65 Fe) MG tablet TAKE 1 TABLET BY MOUTH DAILY WITH BREAKFAST (Patient taking differently: Take 325 mg by mouth daily as needed.) 90 tablet 1   fluticasone  (FLONASE ) 50 MCG/ACT nasal spray USE 1 SPRAY IN RIGHT NOSTRIL AT BEDTIME 48 g 3   folic acid  (FOLVITE ) 1 MG tablet Take 1 mg by mouth daily.     gabapentin  (NEURONTIN ) 300 MG capsule TAKE 1 CAPSULE(300 MG) BY MOUTH THREE TIMES DAILY 270 capsule 1   hydroxychloroquine  (PLAQUENIL ) 200 MG tablet Take 1 tablet (200  mg total) by mouth daily. 30 tablet 2   ketoconazole  (NIZORAL ) 2 % shampoo Apply topically once a week. 360 mL 6   methotrexate (RHEUMATREX) 2.5 MG tablet Take 2.5 mg by mouth once a week. Caution:Chemotherapy. Protect from light.     mometasone  (ELOCON ) 0.1 % lotion APPLY TOPICALLY AS DIRECTED ONCE DAILY FOR UP TO 5 DAYS PER WEEK TO THE AFFECTED AREA OF SCALP AS NEEDED FOR FLARES 60 mL 2   mometasone  (ELOCON ) 0.1 % ointment APPLY TOPICALLY TO SORES ON FACE ONCE DAILY FOR UP TO 5 DAYS PER WEEK 45 g 0   montelukast  (SINGULAIR ) 10 MG tablet TAKE 1 TABLET(10 MG) BY MOUTH AT BEDTIME 90 tablet 3   mupirocin  ointment (BACTROBAN ) 2 % Apply 1 Application topically daily.  Qd to open sores face, body prn flares 22 g 5   NON FORMULARY CPAP with AHP     norethindrone -ethinyl estradiol-iron (AUROVELA FE 1.5/30) 1.5-30 MG-MCG tablet Take 1 tablet by mouth daily. 28 tablet 11   oxyCODONE -acetaminophen  (PERCOCET) 7.5-325 MG tablet Take 1 tablet by mouth every 4 (four) hours as needed for severe pain.     Polyethyl Glycol-Propyl Glycol (SYSTANE ULTRA) 0.4-0.3 % SOLN Place 1 drop into both eyes daily as needed (for dry eyes).      sacubitril-valsartan (ENTRESTO ) 24-26 MG Take 1 tablet by mouth 2 (two) times daily. 180 tablet 3   spironolactone (ALDACTONE) 25 MG tablet Take 0.5 tablets (12.5 mg total) by mouth daily. 45 tablet 3   tacrolimus  (PROGRAF ) 1 MG capsule OPEN 1 CAPSULE AND DISSOLVE INTO A HALF LITER OF WATER . SWISH AROUND FOR 2 MINS THEN SPIT TWICE DAILY 60 capsule 5   tacrolimus  (PROTOPIC ) 0.1 % ointment APPLY TOPICALLY TO THE AFFECTED AREA DAILY 90 g 2   Vitamin D , Ergocalciferol , (DRISDOL ) 1.25 MG (50000 UNIT) CAPS capsule TAKE 1 CAPSULE BY MOUTH EVERY WEEK 12 capsule 3   No current facility-administered medications for this visit.    OBJECTIVE: Vitals:   09/03/24 1059  BP: 123/83  Pulse: 95  Resp: 16  Temp: 98.9 F (37.2 C)  SpO2: 99%     Body mass index is 36.15 kg/m.    ECOG FS:0 -  Asymptomatic  General: Well-developed, well-nourished, no acute distress. Eyes: Pink conjunctiva, anicteric sclera. HEENT: Normocephalic, moist mucous membranes. Lungs: No audible wheezing or coughing. Heart: Regular rate and rhythm. Abdomen: Soft, nontender, no obvious distention. Musculoskeletal: No edema, cyanosis, or clubbing. Neuro: Alert, answering all questions appropriately. Cranial nerves grossly intact. Skin: No rashes or petechiae noted. Psych: Normal affect.  LAB RESULTS:  Lab Results  Component Value Date   NA 138 03/19/2024   K 4.4 03/19/2024   CL 103 03/19/2024   CO2 18 (L) 03/19/2024   GLUCOSE 124 (H) 03/19/2024   BUN 6 03/19/2024   CREATININE 0.93 03/19/2024   CALCIUM 9.1 03/19/2024   PROT 7.1 03/19/2024   ALBUMIN 4.0 03/19/2024   AST 12 03/19/2024   ALT 7 03/19/2024   ALKPHOS 69 03/19/2024   BILITOT <0.2 03/19/2024   GFRNONAA 56 (L) 08/19/2022   GFRAA 58 (L) 10/29/2019    Lab Results  Component Value Date   WBC 4.4 09/03/2024   NEUTROABS 2.8 09/03/2024   HGB 11.5 (L) 09/03/2024   HCT 34.8 (L) 09/03/2024   MCV 94.8 09/03/2024   PLT 254 09/03/2024     STUDIES: No results found.  ASSESSMENT: Atypical lymphocytes.  PLAN:    Atypical lymphocytes: Possibly secondary to Plaquenil  use.  Patient's total white blood cell count and differential are now within normal limits.  Previously, peripheral blood flow cytometry was not drawn therefore this was drawn today for completeness.  If negative, no further follow-up is necessary.  If flow cytometry is positive, will need to follow-up at that time.  I spent a total of 20 minutes reviewing chart data, face-to-face evaluation with the patient, counseling and coordination of care as detailed above.   Patient expressed understanding and was in agreement with this plan. She also understands that She can call clinic at any time with any questions, concerns, or complaints.    Evalene JINNY Reusing, MD    09/03/2024 12:50 PM

## 2024-09-04 LAB — BASIC METABOLIC PANEL WITH GFR
BUN/Creatinine Ratio: 5 — ABNORMAL LOW (ref 9–23)
BUN: 5 mg/dL — ABNORMAL LOW (ref 6–24)
CO2: 24 mmol/L (ref 20–29)
Calcium: 9.2 mg/dL (ref 8.7–10.2)
Chloride: 101 mmol/L (ref 96–106)
Creatinine, Ser: 0.99 mg/dL (ref 0.57–1.00)
Glucose: 105 mg/dL — ABNORMAL HIGH (ref 70–99)
Potassium: 4.3 mmol/L (ref 3.5–5.2)
Sodium: 140 mmol/L (ref 134–144)
eGFR: 72 mL/min/1.73 (ref >59)

## 2024-09-06 ENCOUNTER — Ambulatory Visit: Payer: Self-pay | Admitting: Medical

## 2024-09-23 ENCOUNTER — Ambulatory Visit: Admitting: Physician Assistant

## 2024-09-29 DIAGNOSIS — R8281 Pyuria: Secondary | ICD-10-CM | POA: Diagnosis not present

## 2024-09-29 DIAGNOSIS — Z87442 Personal history of urinary calculi: Secondary | ICD-10-CM | POA: Diagnosis not present

## 2024-09-29 DIAGNOSIS — R808 Other proteinuria: Secondary | ICD-10-CM | POA: Diagnosis not present

## 2024-09-29 DIAGNOSIS — D631 Anemia in chronic kidney disease: Secondary | ICD-10-CM | POA: Diagnosis not present

## 2024-09-29 DIAGNOSIS — N2581 Secondary hyperparathyroidism of renal origin: Secondary | ICD-10-CM | POA: Diagnosis not present

## 2024-09-29 DIAGNOSIS — M329 Systemic lupus erythematosus, unspecified: Secondary | ICD-10-CM | POA: Diagnosis not present

## 2024-09-29 DIAGNOSIS — N179 Acute kidney failure, unspecified: Secondary | ICD-10-CM | POA: Diagnosis not present

## 2024-09-29 DIAGNOSIS — I10 Essential (primary) hypertension: Secondary | ICD-10-CM | POA: Diagnosis not present

## 2024-09-29 DIAGNOSIS — N182 Chronic kidney disease, stage 2 (mild): Secondary | ICD-10-CM | POA: Diagnosis not present

## 2024-09-30 ENCOUNTER — Other Ambulatory Visit: Payer: Self-pay | Admitting: Dermatology

## 2024-10-04 ENCOUNTER — Other Ambulatory Visit: Payer: Self-pay | Admitting: Medical

## 2024-10-05 ENCOUNTER — Ambulatory Visit: Admitting: Nurse Practitioner

## 2024-10-05 ENCOUNTER — Encounter: Payer: Self-pay | Admitting: Nurse Practitioner

## 2024-10-05 VITALS — BP 130/80 | HR 98 | Temp 97.5°F | Resp 16 | Ht 66.0 in | Wt 228.0 lb

## 2024-10-05 DIAGNOSIS — N182 Chronic kidney disease, stage 2 (mild): Secondary | ICD-10-CM | POA: Diagnosis not present

## 2024-10-05 DIAGNOSIS — L299 Pruritus, unspecified: Secondary | ICD-10-CM | POA: Diagnosis not present

## 2024-10-05 DIAGNOSIS — I10 Essential (primary) hypertension: Secondary | ICD-10-CM | POA: Diagnosis not present

## 2024-10-05 DIAGNOSIS — J0100 Acute maxillary sinusitis, unspecified: Secondary | ICD-10-CM | POA: Diagnosis not present

## 2024-10-05 DIAGNOSIS — F410 Panic disorder [episodic paroxysmal anxiety] without agoraphobia: Secondary | ICD-10-CM | POA: Diagnosis not present

## 2024-10-05 DIAGNOSIS — M329 Systemic lupus erythematosus, unspecified: Secondary | ICD-10-CM | POA: Diagnosis not present

## 2024-10-05 DIAGNOSIS — F430 Acute stress reaction: Secondary | ICD-10-CM

## 2024-10-05 DIAGNOSIS — N2581 Secondary hyperparathyroidism of renal origin: Secondary | ICD-10-CM | POA: Diagnosis not present

## 2024-10-05 DIAGNOSIS — R808 Other proteinuria: Secondary | ICD-10-CM | POA: Diagnosis not present

## 2024-10-05 MED ORDER — HYDROXYZINE PAMOATE 25 MG PO CAPS: 25.0000 mg | ORAL_CAPSULE | Freq: Three times a day (TID) | ORAL | 3 refills | Status: DC | PRN

## 2024-10-05 MED ORDER — AZITHROMYCIN 250 MG PO TABS: ORAL_TABLET | ORAL | 0 refills | Status: AC

## 2024-10-05 NOTE — Progress Notes (Signed)
 [facility] [address] [place], [place] [address]  Internal MEDICINE  Office Visit Note  Patient Name: Morgan Kent  [id number]  [id number]  Date of Service: 10/05/2024  Chief Complaint  Patient presents with   Acute Visit    Sunday and this morning- Anxiety/ panic attacks Sick for 2 weeks     HPI Morgan Kent presents for an acute sick visit for anxiety/panic attacks Increased anxiety and panic attacks x3. She had 2 panic attacks on Sunday and 1 this morning. There is nothing specific that has precipitated any of the episodes except for an upsetting conversation about inviting people to a birthday party.  The first panic attack happened Sunday morning. Duration was about 30 minutes. Reports that she had SOB and upset stomach.  Denies any increased stress or relationship problems.  The second attack occurred this morning before her appt with her liver doctor. She reports SOB and upset stomach again with this episode.   Reports having 2 weeks of nasal congestion, cough, yellow sinus drainage. Sore throat, bilateral ear pain. Has been taking mucinex with no significant relief.   Reports random itching in different areas due to her lupus.      11 /25/2025    2:24 PM  GAD 7 : Generalized Anxiety Score  Nervous, Anxious, on Edge 0  Control/stop worrying 0  Worry too much - different things 1  Trouble relaxing 3  Restless 1  Easily annoyed or irritable 0  Afraid - awful might happen 1  Total GAD 7 Score 6  Anxiety Difficulty Somewhat difficult      Current Medication:  Outpatient Encounter Medications as of 10/05/2024  Medication Sig   azithromycin  (ZITHROMAX ) 250 MG tablet Take 2 tablets on day 1, then 1 tablet daily on days 2 through 5   hydrOXYzine  (VISTARIL ) 25 MG capsule Take 1 capsule (25 mg total) by mouth every 8 (eight) hours as needed for anxiety or itching (or panic).   albuterol  (VENTOLIN  HFA) 108 (90 Base) MCG/ACT inhaler INHALE 2 PUFFS INTO THE  LUNGS DAILY   amLODipine  (NORVASC ) 5 MG tablet Take 5 mg by mouth daily.   budeson-glycopyrrolate -formoterol (BREZTRI  AEROSPHERE) 160-9-4.8 MCG/ACT AERO inhaler INHALE 2 PUFFS BY MOUTH INTO THE LUNGS TWICE DAILY   carvedilol  (COREG ) 25 MG tablet TAKE 1 TABLET(25 MG) BY MOUTH TWICE DAILY   cetirizine  (ZYRTEC ) 10 MG tablet Take 1 tablet (10 mg total) by mouth daily.   clobetasol  ointment (TEMOVATE ) 0.05 % Apply 1 Application topically as directed. qd up to 5 days a week to lupus on arms as needed for flares, avoid face, groin, axilla   clotrimazole  (MYCELEX ) 10 MG troche TAKE 1 BY MOUTH EVERY DAY, SUCH ON ONE TROCHE UNTIL DISSOLVED   Cyanocobalamin  (B-12 COMPLIANCE INJECTION) 1000 MCG/ML KIT Inject as directed.   cyclobenzaprine  (FLEXERIL ) 10 MG tablet Take 1 tablet (10 mg total) by mouth 3 (three) times daily as needed.   Dapsone  7.5 % GEL APPLY TOPICALLY TO FACE TWICE DAILY EVERY DAY AS DIRECTED FOR ACNE   diclofenac  (VOLTAREN ) 50 MG EC tablet Take 50 mg by mouth 2 (two) times daily.   diclofenac  Sodium (VOLTAREN ) 1 % GEL APPLY 2 GRAMS TOPICALLY TO THE AFFECTED AREA FOUR TIMES DAILY   empagliflozin  (JARDIANCE ) 10 MG TABS tablet Take 1 tablet (10 mg total) by mouth daily before breakfast.   FEROSUL 325 (65 Fe) MG tablet TAKE 1 TABLET BY MOUTH DAILY WITH BREAKFAST (Patient taking differently: Take 325 mg by mouth daily as  needed.)   fluticasone  (FLONASE ) 50 MCG/ACT nasal spray USE 1 SPRAY IN RIGHT NOSTRIL AT BEDTIME   folic acid  (FOLVITE ) 1 MG tablet Take 1 mg by mouth daily.   gabapentin  (NEURONTIN ) 300 MG capsule TAKE 1 CAPSULE(300 MG) BY MOUTH THREE TIMES DAILY   hydroxychloroquine  (PLAQUENIL ) 200 MG tablet Take 1 tablet (200 mg total) by mouth daily.   ketoconazole  (NIZORAL ) 2 % shampoo APPLY TOPICALLY 1 TIME A WEEK   methotrexate (RHEUMATREX) 2.5 MG tablet Take 2.5 mg by mouth once a week. Caution:Chemotherapy. Protect from light.   mometasone  (ELOCON ) 0.1 % lotion APPLY TOPICALLY AS DIRECTED  ONCE DAILY FOR UP TO 5 DAYS PER WEEK TO THE AFFECTED AREA OF SCALP AS NEEDED FOR FLARES   mometasone  (ELOCON ) 0.1 % ointment APPLY TOPICALLY TO SORES ON FACE ONCE DAILY FOR UP TO 5 DAYS PER WEEK   montelukast  (SINGULAIR ) 10 MG tablet TAKE 1 TABLET(10 MG) BY MOUTH AT BEDTIME   mupirocin  ointment (BACTROBAN ) 2 % Apply 1 Application topically daily. Qd to open sores face, body prn flares   NON FORMULARY CPAP with AHP   norethindrone -ethinyl estradiol-iron (AUROVELA FE 1.5/30) 1.5-30 MG-MCG tablet Take 1 tablet by mouth daily.   oxyCODONE -acetaminophen  (PERCOCET) 7.5-325 MG tablet Take 1 tablet by mouth every 4 (four) hours as needed for severe pain.   Polyethyl Glycol-Propyl Glycol (SYSTANE ULTRA) 0.4-0.3 % SOLN Place 1 drop into both eyes daily as needed (for dry eyes).    sacubitril -valsartan  (ENTRESTO ) 24-26 MG Take 1 tablet by mouth 2 (two) times daily.   spironolactone  (ALDACTONE ) 25 MG tablet Take 0.5 tablets (12.5 mg total) by mouth daily.   tacrolimus  (PROGRAF ) 1 MG capsule OPEN 1 CAPSULE AND DISSOLVE INTO A HALF LITER OF WATER . SWISH AROUND FOR 2 MINS THEN SPIT TWICE DAILY   tacrolimus  (PROTOPIC ) 0.1 % ointment APPLY TOPICALLY TO THE AFFECTED AREA DAILY   Vitamin D , Ergocalciferol , (DRISDOL ) 1.25 MG (50000 UNIT) CAPS capsule TAKE 1 CAPSULE BY MOUTH EVERY WEEK   No facility-administered encounter medications on file as of 10/05/2024.      Medical History: Past Medical History:  Diagnosis Date   Anxiety    Arthritis    rheumo possible   Chiari malformation    Depression    Dizziness    Headache(784.0)    History of kidney stones    Hypertension    Lupus    Lupus    Nerve damage    NECK   PONV (postoperative nausea and vomiting)    nausea only   Sleep apnea    TMJ arthritis      Vital Signs: BP 130/80   Pulse 98 Comment: 106  Temp (!) 97.5 F (36.4 C)   Resp 16   Ht 5' 6 (1.676 m)   Wt 228 lb (103.4 kg)   SpO2 97%   BMI 36.80 kg/m    Review of Systems   Constitutional:  Positive for chills and fatigue. Negative for appetite change and fever.  HENT:  Positive for congestion, ear pain, postnasal drip, rhinorrhea, sinus pressure, sinus pain and sore throat.   Respiratory:  Positive for cough. Negative for chest tightness, shortness of breath and wheezing.   Cardiovascular: Negative.  Negative for chest pain and palpitations.  Musculoskeletal: Negative.  Negative for myalgias.  Skin: Negative.   Neurological: Negative.  Negative for headaches.    Physical Exam Vitals reviewed.  Constitutional:      General: She is not in acute distress.    Appearance:  Normal appearance. She is obese. She is ill-appearing.  HENT:     Head: Normocephalic and atraumatic.     Nose: Congestion and rhinorrhea present.     Mouth/Throat:     Mouth: Mucous membranes are moist.     Pharynx: Posterior oropharyngeal erythema present.  Eyes:     Pupils: Pupils are equal, round, and reactive to light.  Cardiovascular:     Rate and Rhythm: Normal rate and regular rhythm.     Heart sounds: Normal heart sounds. No murmur heard. Pulmonary:     Effort: Pulmonary effort is normal. No respiratory distress.     Breath sounds: Normal breath sounds. No wheezing.  Skin:    General: Skin is warm and dry.     Capillary Refill: Capillary refill takes less than 2 seconds.  Neurological:     Mental Status: She is alert and oriented to person, place, and time.  Psychiatric:        Attention and Perception: Attention normal.        Mood and Affect: Affect normal. Mood is anxious.        Speech: Speech normal.        Behavior: Behavior normal. Behavior is cooperative.        Thought Content: Thought content normal.        Cognition and Memory: Cognition normal.        Judgment: Judgment normal.       Assessment/Plan: 1. Acute non-recurrent maxillary sinusitis (Primary) Zpak prescribed, take until gone.  - azithromycin  (ZITHROMAX ) 250 MG tablet; Take 2 tablets on day  1, then 1 tablet daily on days 2 through 5  Dispense: 6 tablet; Refill: 0  2. Pruritus due to inflammation Hydroxyzine  prescribed as needed. Follow up in December with PCP - hydrOXYzine  (VISTARIL ) 25 MG capsule; Take 1 capsule (25 mg total) by mouth every 8 (eight) hours as needed for anxiety or itching (or panic).  Dispense: 60 capsule; Refill: 3  3. Panic attack as reaction to stress Hydroxyzine  prescribed as needed, follow up in December with PCP - hydrOXYzine  (VISTARIL ) 25 MG capsule; Take 1 capsule (25 mg total) by mouth every 8 (eight) hours as needed for anxiety or itching (or panic).  Dispense: 60 capsule; Refill: 3   General Counseling: Davielle verbalizes understanding of the findings of todays visit and agrees with plan of treatment. I have discussed any further diagnostic evaluation that may be needed or ordered today. We also reviewed her medications today. she has been encouraged to call the office with any questions or concerns that should arise related to todays visit.    Counseling:    No orders of the defined types were placed in this encounter.   Meds ordered this encounter  Medications   hydrOXYzine  (VISTARIL ) 25 MG capsule    Sig: Take 1 capsule (25 mg total) by mouth every 8 (eight) hours as needed for anxiety or itching (or panic).    Dispense:  60 capsule    Refill:  3    Fill new script today   azithromycin  (ZITHROMAX ) 250 MG tablet    Sig: Take 2 tablets on day 1, then 1 tablet daily on days 2 through 5    Dispense:  6 tablet    Refill:  0    Return for has upcoming appt to see PCP in december but needs to move it to a different day. .  [place] Controlled Substance Database was reviewed by me for overdose risk score (ORS)  Time spent:30 Minutes Time spent with patient included reviewing progress notes, labs, imaging studies, and discussing plan for follow up.   This patient was seen by Mardy Maxin, FNP-C in collaboration with Dr. Sigrid Bathe as a part  of collaborative care agreement.  Tieisha Darden R. Maxin, MSN, FNP-C Internal Medicine

## 2024-10-19 DIAGNOSIS — R070 Pain in throat: Secondary | ICD-10-CM | POA: Diagnosis not present

## 2024-10-19 DIAGNOSIS — J0110 Acute frontal sinusitis, unspecified: Secondary | ICD-10-CM | POA: Diagnosis not present

## 2024-10-21 ENCOUNTER — Ambulatory Visit: Admitting: Physician Assistant

## 2024-10-22 ENCOUNTER — Encounter: Payer: Self-pay | Admitting: Physician Assistant

## 2024-10-22 ENCOUNTER — Ambulatory Visit: Admitting: Physician Assistant

## 2024-10-22 VITALS — BP 106/81 | Temp 97.8°F | Resp 16 | Ht 66.0 in | Wt 217.0 lb

## 2024-10-22 DIAGNOSIS — R7303 Prediabetes: Secondary | ICD-10-CM

## 2024-10-22 DIAGNOSIS — I10 Essential (primary) hypertension: Secondary | ICD-10-CM | POA: Diagnosis not present

## 2024-10-22 DIAGNOSIS — M792 Neuralgia and neuritis, unspecified: Secondary | ICD-10-CM

## 2024-10-22 DIAGNOSIS — J4520 Mild intermittent asthma, uncomplicated: Secondary | ICD-10-CM | POA: Diagnosis not present

## 2024-10-22 LAB — POCT GLYCOSYLATED HEMOGLOBIN (HGB A1C): Hemoglobin A1C: 5.7 % — AB (ref 4.0–5.6)

## 2024-10-22 MED ORDER — MONTELUKAST SODIUM 10 MG PO TABS: 10.0000 mg | ORAL_TABLET | Freq: Every day | ORAL | 3 refills | Status: AC

## 2024-10-22 MED ORDER — GABAPENTIN 300 MG PO CAPS: ORAL_CAPSULE | ORAL | 1 refills | Status: AC

## 2024-10-22 NOTE — Progress Notes (Unsigned)
 [facility] [address] [place], [place] [address]  Internal MEDICINE  Office Visit Note  Patient Name: Morgan Kent  [id number]  [id number]  Date of Service: 10/22/2024  Chief Complaint  Patient presents with   Follow-up    HPI  Pt is here for routine follow up -has seen hematology, but is awaiting results -Taking Antibiotic for sinus infection from urgent care. Ear still bothersome, but is still taking the medication.   Current Medication: Outpatient Encounter Medications as of 10/22/2024  Medication Sig   albuterol  (VENTOLIN  HFA) 108 (90 Base) MCG/ACT inhaler INHALE 2 PUFFS INTO THE LUNGS DAILY   amLODipine  (NORVASC ) 5 MG tablet Take 5 mg by mouth daily.   budeson-glycopyrrolate -formoterol (BREZTRI  AEROSPHERE) 160-9-4.8 MCG/ACT AERO inhaler INHALE 2 PUFFS BY MOUTH INTO THE LUNGS TWICE DAILY   carvedilol  (COREG ) 25 MG tablet TAKE 1 TABLET(25 MG) BY MOUTH TWICE DAILY   cetirizine  (ZYRTEC ) 10 MG tablet Take 1 tablet (10 mg total) by mouth daily.   clobetasol  ointment (TEMOVATE ) 0.05 % Apply 1 Application topically as directed. qd up to 5 days a week to lupus on arms as needed for flares, avoid face, groin, axilla   cyclobenzaprine  (FLEXERIL ) 10 MG tablet Take 1 tablet (10 mg total) by mouth 3 (three) times daily as needed.   Dapsone  7.5 % GEL APPLY TOPICALLY TO FACE TWICE DAILY EVERY DAY AS DIRECTED FOR ACNE   diclofenac  (VOLTAREN ) 50 MG EC tablet Take 50 mg by mouth 2 (two) times daily.   diclofenac  Sodium (VOLTAREN ) 1 % GEL APPLY 2 GRAMS TOPICALLY TO THE AFFECTED AREA FOUR TIMES DAILY   FEROSUL 325 (65 Fe) MG tablet TAKE 1 TABLET BY MOUTH DAILY WITH BREAKFAST (Patient taking differently: Take 325 mg by mouth daily as needed.)   fluticasone  (FLONASE ) 50 MCG/ACT nasal spray USE 1 SPRAY IN RIGHT NOSTRIL AT BEDTIME   folic acid  (FOLVITE ) 1 MG tablet Take 1 mg by mouth daily.   gabapentin  (NEURONTIN ) 300 MG capsule TAKE 1 CAPSULE(300 MG) BY MOUTH THREE TIMES DAILY    hydroxychloroquine  (PLAQUENIL ) 200 MG tablet Take 1 tablet (200 mg total) by mouth daily.   hydrOXYzine  (VISTARIL ) 25 MG capsule Take 1 capsule (25 mg total) by mouth every 8 (eight) hours as needed for anxiety or itching (or panic).   JARDIANCE  10 MG TABS tablet TAKE 1 TABLET(10 MG) BY MOUTH DAILY BEFORE BREAKFAST   ketoconazole  (NIZORAL ) 2 % shampoo APPLY TOPICALLY 1 TIME A WEEK   methotrexate (RHEUMATREX) 2.5 MG tablet Take 2.5 mg by mouth once a week. Caution:Chemotherapy. Protect from light.   mometasone  (ELOCON ) 0.1 % lotion APPLY TOPICALLY AS DIRECTED ONCE DAILY FOR UP TO 5 DAYS PER WEEK TO THE AFFECTED AREA OF SCALP AS NEEDED FOR FLARES   mometasone  (ELOCON ) 0.1 % ointment APPLY TOPICALLY TO SORES ON FACE ONCE DAILY FOR UP TO 5 DAYS PER WEEK   montelukast  (SINGULAIR ) 10 MG tablet TAKE 1 TABLET(10 MG) BY MOUTH AT BEDTIME   mupirocin  ointment (BACTROBAN ) 2 % Apply 1 Application topically daily. Qd to open sores face, body prn flares   NON FORMULARY CPAP with AHP   norethindrone -ethinyl estradiol-iron (AUROVELA FE 1.5/30) 1.5-30 MG-MCG tablet Take 1 tablet by mouth daily.   oxyCODONE -acetaminophen  (PERCOCET) 7.5-325 MG tablet Take 1 tablet by mouth every 4 (four) hours as needed for severe pain.   Polyethyl Glycol-Propyl Glycol (SYSTANE ULTRA) 0.4-0.3 % SOLN Place 1 drop into both eyes daily as needed (for dry eyes).    sacubitril -valsartan  (  ENTRESTO ) 24-26 MG Take 1 tablet by mouth 2 (two) times daily.   spironolactone  (ALDACTONE ) 25 MG tablet Take 0.5 tablets (12.5 mg total) by mouth daily.   tacrolimus  (PROGRAF ) 1 MG capsule OPEN 1 CAPSULE AND DISSOLVE INTO A HALF LITER OF WATER . SWISH AROUND FOR 2 MINS THEN SPIT TWICE DAILY   tacrolimus  (PROTOPIC ) 0.1 % ointment APPLY TOPICALLY TO THE AFFECTED AREA DAILY   Vitamin D , Ergocalciferol , (DRISDOL ) 1.25 MG (50000 UNIT) CAPS capsule TAKE 1 CAPSULE BY MOUTH EVERY WEEK   clotrimazole  (MYCELEX ) 10 MG troche TAKE 1 BY MOUTH EVERY DAY, SUCH ON ONE  TROCHE UNTIL DISSOLVED (Patient not taking: Reported on 10/22/2024)   Cyanocobalamin  (B-12 COMPLIANCE INJECTION) 1000 MCG/ML KIT Inject as directed. (Patient not taking: Reported on 10/22/2024)   No facility-administered encounter medications on file as of 10/22/2024.    Surgical History: Past Surgical History:  Procedure Laterality Date   carpel tunnel  Left 2014   CRANIOTOMY  14,06   chiari   DENTAL SURGERY     HERNIA REPAIR     umbilical   PLACEMENT OF LUMBAR DRAIN N/A 07/07/2014   Procedure: PLACEMENT OF LUMBAR DRAIN AND CLOSURE OF CERVICAL INCISION;  Surgeon: Reyes Budge, MD;  Location: [facility];  Service: Neurosurgery;  Laterality: N/A;   SUBOCCIPITAL CRANIECTOMY CERVICAL LAMINECTOMY N/A 09/29/2013   Procedure: SUBOCCIPITAL CRANIECTOMY CERVICAL LAMINECTOMY/DURAPLASTY;  Surgeon: Reyes JONETTA Budge, MD;  Location: [facility];  Service: Neurosurgery;  Laterality: N/A;  posterior   SUBOCCIPITAL CRANIECTOMY CERVICAL LAMINECTOMY N/A 06/23/2014   Procedure: SUBOCCIPITAL CRANIECTOMY CERVICAL LAMINECTOMY/DURAPLASTY;  Surgeon: Reyes Budge, MD;  Location: [facility];  Service: Neurosurgery;  Laterality: N/A;  suboccipital craniectomy with cervical laminectomy and duraplasty    Medical History: Past Medical History:  Diagnosis Date   Anxiety    Arthritis    rheumo possible   Chiari malformation    Depression    Dizziness    Headache(784.0)    History of kidney stones    Hypertension    Lupus    Lupus    Nerve damage    NECK   PONV (postoperative nausea and vomiting)    nausea only   Sleep apnea    TMJ arthritis     Family History: Family History  Problem Relation Age of Onset   Hypertension Mother    Thyroid  disease Mother    Heart disease Father    Hypertension Father    Breast cancer Other 8 - 64       maternal great grandmaw   Asthma Other    Depression Other    Diabetes Other    Migraines Other    Stroke Other     Social History    Socioeconomic History   Marital status: Single    Spouse name: Not on file   Number of children: Not on file   Years of education: Not on file   Highest education level: Not on file  Occupational History   Not on file  Tobacco Use   Smoking status: Never   Smokeless tobacco: Never  Vaping Use   Vaping status: Never Used  Substance and Sexual Activity   Alcohol  use: No    Alcohol /week: 0.0 standard drinks of alcohol    Drug use: No   Sexual activity: Not on file  Other Topics Concern   Not on file  Social History Narrative   Lives with 3 children in 2 story home.  Unemployed.  Trying to get disability.  Education:  high school.   Social Drivers of Health   Tobacco Use: Low Risk (10/22/2024)   Patient History    Smoking Tobacco Use: Never    Smokeless Tobacco Use: Never    Passive Exposure: Not on file  Financial Resource Strain: Not on file  Food Insecurity: Not on file  Transportation Needs: Not on file  Physical Activity: Not on file  Stress: Not on file  Social Connections: Not on file  Intimate Partner Violence: Not on file  Depression (PHQ2-9): Low Risk (10/22/2024)   Depression (PHQ2-9)    PHQ-2 Score: 0  Alcohol  Screen: Low Risk (06/13/2022)   Alcohol  Screen    Last Alcohol  Screening Score (AUDIT): 0  Housing: Not on file  Utilities: Not on file  Health Literacy: Not on file      Review of Systems  Vital Signs: BP 106/81   Temp 97.8 F (36.6 C)   Resp 16   Ht 5' 6 (1.676 m)   Wt 217 lb (98.4 kg)   SpO2 94%   BMI 35.02 kg/m    Physical Exam     Assessment/Plan:   General Counseling: Morgan Kent verbalizes understanding of the findings of todays visit and agrees with plan of treatment. I have discussed any further diagnostic evaluation that may be needed or ordered today. We also reviewed her medications today. she has been encouraged to call the office with any questions or concerns that should arise related to todays visit.    No orders of  the defined types were placed in this encounter.   No orders of the defined types were placed in this encounter.   This patient was seen by Tinnie Pro, PA-C in collaboration with Dr. Sigrid Bathe as a part of collaborative care agreement.   Total time spent:*** Minutes Time spent includes review of chart, medications, test results, and follow up plan with the patient.      Dr Fozia M Khan Internal medicine

## 2024-10-24 ENCOUNTER — Other Ambulatory Visit: Payer: Self-pay | Admitting: Dermatology

## 2024-10-24 DIAGNOSIS — K121 Other forms of stomatitis: Secondary | ICD-10-CM

## 2024-10-24 DIAGNOSIS — L930 Discoid lupus erythematosus: Secondary | ICD-10-CM

## 2024-10-26 ENCOUNTER — Ambulatory Visit (INDEPENDENT_AMBULATORY_CARE_PROVIDER_SITE_OTHER): Admitting: Internal Medicine

## 2024-10-26 ENCOUNTER — Encounter: Payer: Self-pay | Admitting: Internal Medicine

## 2024-10-26 VITALS — BP 91/60 | HR 82 | Temp 98.0°F | Resp 16 | Ht 66.0 in | Wt 216.8 lb

## 2024-10-26 DIAGNOSIS — J439 Emphysema, unspecified: Secondary | ICD-10-CM | POA: Diagnosis not present

## 2024-10-26 DIAGNOSIS — G4733 Obstructive sleep apnea (adult) (pediatric): Secondary | ICD-10-CM | POA: Diagnosis not present

## 2024-10-26 NOTE — Patient Instructions (Signed)

## 2024-10-26 NOTE — Progress Notes (Signed)
 [facility] [address] [place], [place] [address]  Pulmonary Sleep Medicine   Office Visit Note  Patient Name: Morgan Kent DOB: Jul 28, 1981 MRN [id number]  Date of Service: 10/26/2024  Complaints/HPI: She had a Ct chest done and she has benign appearing nodules. She has no ILD but interestingly has some paraseptal emphysema. She does not smoke. She has strong second hand smoke history however for at least 20 years. Her PFT shows FEV1 to be 49% and she has a reduction of her TLC. She has lupus and she has a normal DLCO.   Office Spirometry Results:     ROS  General: (-) fever, (-) chills, (-) night sweats, (-) weakness Skin: (-) rashes, (-) itching,. Eyes: (-) visual changes, (-) redness, (-) itching. Nose and Sinuses: (-) nasal stuffiness or itchiness, (-) postnasal drip, (-) nosebleeds, (-) sinus trouble. Mouth and Throat: (-) sore throat, (-) hoarseness. Neck: (-) swollen glands, (-) enlarged thyroid , (-) neck pain. Respiratory: - cough, (-) bloody sputum, + shortness of breath, - wheezing. Cardiovascular: - ankle swelling, (-) chest pain. Lymphatic: (-) lymph node enlargement. Neurologic: (-) numbness, (-) tingling. Psychiatric: (-) anxiety, (-) depression   Current Medication: Outpatient Encounter Medications as of 10/26/2024  Medication Sig   albuterol  (VENTOLIN  HFA) 108 (90 Base) MCG/ACT inhaler INHALE 2 PUFFS INTO THE LUNGS DAILY   amLODipine  (NORVASC ) 5 MG tablet Take 5 mg by mouth daily.   budeson-glycopyrrolate -formoterol (BREZTRI  AEROSPHERE) 160-9-4.8 MCG/ACT AERO inhaler INHALE 2 PUFFS BY MOUTH INTO THE LUNGS TWICE DAILY   carvedilol  (COREG ) 25 MG tablet TAKE 1 TABLET(25 MG) BY MOUTH TWICE DAILY   cetirizine  (ZYRTEC ) 10 MG tablet Take 1 tablet (10 mg total) by mouth daily.   clobetasol  ointment (TEMOVATE ) 0.05 % Apply 1 Application topically as directed. qd up to 5 days a week to lupus on arms as needed for flares, avoid face, groin, axilla    clotrimazole  (MYCELEX ) 10 MG troche TAKE 1 BY MOUTH EVERY DAY, SUCH ON ONE TROCHE UNTIL DISSOLVED   Cyanocobalamin  (B-12 COMPLIANCE INJECTION) 1000 MCG/ML KIT Inject as directed.   cyclobenzaprine  (FLEXERIL ) 10 MG tablet Take 1 tablet (10 mg total) by mouth 3 (three) times daily as needed.   Dapsone  7.5 % GEL APPLY TOPICALLY TO FACE TWICE DAILY EVERY DAY AS DIRECTED FOR ACNE   diclofenac  (VOLTAREN ) 50 MG EC tablet Take 50 mg by mouth 2 (two) times daily.   diclofenac  Sodium (VOLTAREN ) 1 % GEL APPLY 2 GRAMS TOPICALLY TO THE AFFECTED AREA FOUR TIMES DAILY   FEROSUL 325 (65 Fe) MG tablet TAKE 1 TABLET BY MOUTH DAILY WITH BREAKFAST (Patient taking differently: Take 325 mg by mouth daily as needed.)   fluticasone  (FLONASE ) 50 MCG/ACT nasal spray USE 1 SPRAY IN RIGHT NOSTRIL AT BEDTIME   folic acid  (FOLVITE ) 1 MG tablet Take 1 mg by mouth daily.   gabapentin  (NEURONTIN ) 300 MG capsule TAKE 1 CAPSULE(300 MG) BY MOUTH THREE TIMES DAILY   hydroxychloroquine  (PLAQUENIL ) 200 MG tablet Take 1 tablet (200 mg total) by mouth daily.   hydrOXYzine  (VISTARIL ) 25 MG capsule Take 1 capsule (25 mg total) by mouth every 8 (eight) hours as needed for anxiety or itching (or panic).   JARDIANCE  10 MG TABS tablet TAKE 1 TABLET(10 MG) BY MOUTH DAILY BEFORE BREAKFAST   ketoconazole  (NIZORAL ) 2 % shampoo APPLY TOPICALLY 1 TIME A WEEK   methotrexate (RHEUMATREX) 2.5 MG tablet Take 2.5 mg by mouth once a week. Caution:Chemotherapy. Protect from light.   mometasone  (  ELOCON ) 0.1 % lotion APPLY TOPICALLY AS DIRECTED ONCE DAILY FOR UP TO 5 DAYS PER WEEK TO THE AFFECTED AREA OF SCALP AS NEEDED FOR FLARES   mometasone  (ELOCON ) 0.1 % ointment APPLY TOPICALLY TO SORES ON FACE ONCE DAILY FOR UP TO 5 DAYS PER WEEK   montelukast  (SINGULAIR ) 10 MG tablet Take 1 tablet (10 mg total) by mouth at bedtime.   mupirocin  ointment (BACTROBAN ) 2 % Apply 1 Application topically daily. Qd to open sores face, body prn flares   NON FORMULARY CPAP with  AHP   norethindrone -ethinyl estradiol-iron (AUROVELA FE 1.5/30) 1.5-30 MG-MCG tablet Take 1 tablet by mouth daily.   oxyCODONE -acetaminophen  (PERCOCET) 7.5-325 MG tablet Take 1 tablet by mouth every 4 (four) hours as needed for severe pain.   Polyethyl Glycol-Propyl Glycol (SYSTANE ULTRA) 0.4-0.3 % SOLN Place 1 drop into both eyes daily as needed (for dry eyes).    sacubitril -valsartan  (ENTRESTO ) 24-26 MG Take 1 tablet by mouth 2 (two) times daily.   spironolactone  (ALDACTONE ) 25 MG tablet Take 0.5 tablets (12.5 mg total) by mouth daily.   tacrolimus  (PROGRAF ) 1 MG capsule OPEN 1 CAPSULE AND DISSOLVE INTO A HALF LITER OF WATER . SWISH AROUND FOR 2 MINS THEN SPIT TWICE DAILY   tacrolimus  (PROTOPIC ) 0.1 % ointment APPLY TOPICALLY TO THE AFFECTED AREA DAILY   Vitamin D , Ergocalciferol , (DRISDOL ) 1.25 MG (50000 UNIT) CAPS capsule TAKE 1 CAPSULE BY MOUTH EVERY WEEK   No facility-administered encounter medications on file as of 10/26/2024.    Surgical History: Past Surgical History:  Procedure Laterality Date   carpel tunnel  Left 2014   CRANIOTOMY  14,06   chiari   DENTAL SURGERY     HERNIA REPAIR     umbilical   PLACEMENT OF LUMBAR DRAIN N/A 07/07/2014   Procedure: PLACEMENT OF LUMBAR DRAIN AND CLOSURE OF CERVICAL INCISION;  Surgeon: Reyes Budge, MD;  Location: [facility];  Service: Neurosurgery;  Laterality: N/A;   SUBOCCIPITAL CRANIECTOMY CERVICAL LAMINECTOMY N/A 09/29/2013   Procedure: SUBOCCIPITAL CRANIECTOMY CERVICAL LAMINECTOMY/DURAPLASTY;  Surgeon: Reyes JONETTA Budge, MD;  Location: [facility];  Service: Neurosurgery;  Laterality: N/A;  posterior   SUBOCCIPITAL CRANIECTOMY CERVICAL LAMINECTOMY N/A 06/23/2014   Procedure: SUBOCCIPITAL CRANIECTOMY CERVICAL LAMINECTOMY/DURAPLASTY;  Surgeon: Reyes Budge, MD;  Location: [facility];  Service: Neurosurgery;  Laterality: N/A;  suboccipital craniectomy with cervical laminectomy and duraplasty    Medical History: Past Medical  History:  Diagnosis Date   Anxiety    Arthritis    rheumo possible   Chiari malformation    Depression    Dizziness    Headache(784.0)    History of kidney stones    Hypertension    Lupus    Lupus    Nerve damage    NECK   PONV (postoperative nausea and vomiting)    nausea only   Sleep apnea    TMJ arthritis     Family History: Family History  Problem Relation Age of Onset   Hypertension Mother    Thyroid  disease Mother    Heart disease Father    Hypertension Father    Breast cancer Other 1 - 46       maternal great grandmaw   Asthma Other    Depression Other    Diabetes Other    Migraines Other    Stroke Other     Social History: Social History   Socioeconomic History   Marital status: Single    Spouse name: Not on file  Number of children: Not on file   Years of education: Not on file   Highest education level: Not on file  Occupational History   Not on file  Tobacco Use   Smoking status: Never   Smokeless tobacco: Never  Vaping Use   Vaping status: Never Used  Substance and Sexual Activity   Alcohol  use: No    Alcohol /week: 0.0 standard drinks of alcohol    Drug use: No   Sexual activity: Not on file  Other Topics Concern   Not on file  Social History Narrative   Lives with 3 children in 2 story home.  Unemployed.  Trying to get disability.  Education: high school.   Social Drivers of Health   Tobacco Use: Low Risk (10/26/2024)   Patient History    Smoking Tobacco Use: Never    Smokeless Tobacco Use: Never    Passive Exposure: Not on file  Financial Resource Strain: Not on file  Food Insecurity: Not on file  Transportation Needs: Not on file  Physical Activity: Not on file  Stress: Not on file  Social Connections: Not on file  Intimate Partner Violence: Not on file  Depression (PHQ2-9): Low Risk (10/22/2024)   Depression (PHQ2-9)    PHQ-2 Score: 0  Alcohol  Screen: Low Risk (06/13/2022)   Alcohol  Screen    Last Alcohol  Screening Score  (AUDIT): 0  Housing: Not on file  Utilities: Not on file  Health Literacy: Not on file    Vital Signs: Blood pressure 91/60, pulse 82, temperature 98 F (36.7 C), resp. rate 16, height 5' 6 (1.676 m), weight 216 lb 12.8 oz (98.3 kg), SpO2 96%.  Examination: General Appearance: The patient is well-developed, well-nourished, and in no distress. Skin: Gross inspection of skin unremarkable. Head: normocephalic, no gross deformities. Eyes: no gross deformities noted. ENT: ears appear grossly normal no exudates. Neck: Supple. No thyromegaly. No LAD. Respiratory: no rhonchi noted. Cardiovascular: Normal S1 and S2 without murmur or rub. Extremities: No cyanosis. pulses are equal. Neurologic: Alert and oriented. No involuntary movements.  LABS: Recent Results (from the past 2160 hours)  CBC with Differential/Platelet     Status: Abnormal   Collection Time: 08/13/24 10:26 AM  Result Value Ref Range   WBC 3.8 (L) 4.0 - 10.5 K/uL   RBC 3.90 3.87 - 5.11 MIL/uL   Hemoglobin 12.2 12.0 - 15.0 g/dL   HCT 63.9 63.9 - 53.9 %   MCV 92.3 80.0 - 100.0 fL   MCH 31.3 26.0 - 34.0 pg   MCHC 33.9 30.0 - 36.0 g/dL   RDW 86.6 88.4 - 84.4 %   Platelets 277 150 - 400 K/uL   nRBC 0.0 0.0 - 0.2 %   Neutrophils Relative % 48 %   Neutro Abs 1.9 1.7 - 7.7 K/uL   Lymphocytes Relative 38 %   Lymphs Abs 1.4 0.7 - 4.0 K/uL   Monocytes Relative 11 %   Monocytes Absolute 0.4 0.1 - 1.0 K/uL   Eosinophils Relative 2 %   Eosinophils Absolute 0.1 0.0 - 0.5 K/uL   Basophils Relative 1 %   Basophils Absolute 0.0 0.0 - 0.1 K/uL   WBC Morphology MORPHOLOGY UNREMARKABLE    RBC Morphology MORPHOLOGY UNREMARKABLE    Smear Review Normal platelet morphology    Immature Granulocytes 0 %   Abs Immature Granulocytes 0.01 0.00 - 0.07 K/uL    Comment: Performed at [facility], [address] [place], [place] [address]  Pulmonary Function Test  Status: None   Collection Time: 09/01/24  3:09 PM  Result Value  Ref Range   FEV1     FVC     FEV1/FVC     TLC     DLCO    Basic metabolic panel with GFR     Status: Abnormal   Collection Time: 09/03/24  9:40 AM  Result Value Ref Range   Glucose 105 (H) 70 - 99 mg/dL   BUN 5 (L) 6 - 24 mg/dL   Creatinine, Ser 9.00 0.57 - 1.00 mg/dL   eGFR 72 >40 fO/fpw/8.26   BUN/Creatinine Ratio 5 (L) 9 - 23   Sodium 140 134 - 144 mmol/L   Potassium 4.3 3.5 - 5.2 mmol/L   Chloride 101 96 - 106 mmol/L   CO2 24 20 - 29 mmol/L   Calcium 9.2 8.7 - 10.2 mg/dL  CBC with Differential/Platelet     Status: Abnormal   Collection Time: 09/03/24 11:35 AM  Result Value Ref Range   WBC 4.4 4.0 - 10.5 K/uL   RBC 3.67 (L) 3.87 - 5.11 MIL/uL   Hemoglobin 11.5 (L) 12.0 - 15.0 g/dL   HCT 65.1 (L) 63.9 - 53.9 %   MCV 94.8 80.0 - 100.0 fL   MCH 31.3 26.0 - 34.0 pg   MCHC 33.0 30.0 - 36.0 g/dL   RDW 86.7 88.4 - 84.4 %   Platelets 254 150 - 400 K/uL   nRBC 0.0 0.0 - 0.2 %   Neutrophils Relative % 63 %   Neutro Abs 2.8 1.7 - 7.7 K/uL   Lymphocytes Relative 26 %   Lymphs Abs 1.1 0.7 - 4.0 K/uL   Monocytes Relative 9 %   Monocytes Absolute 0.4 0.1 - 1.0 K/uL   Eosinophils Relative 2 %   Eosinophils Absolute 0.1 0.0 - 0.5 K/uL   Basophils Relative 0 %   Basophils Absolute 0.0 0.0 - 0.1 K/uL   Immature Granulocytes 0 %   Abs Immature Granulocytes 0.01 0.00 - 0.07 K/uL    Comment: Performed at [facility], [address] [place], [place] [address]  POCT glycosylated hemoglobin (Hb A1C)     Status: Abnormal   Collection Time: 10/22/24  9:55 AM  Result Value Ref Range   Hemoglobin A1C 5.7 (A) 4.0 - 5.6 %   HbA1c POC (<> result, manual entry)     HbA1c, POC (prediabetic range)     HbA1c, POC (controlled diabetic range)      Radiology: CT Chest High Resolution Result Date: 09/05/2024 EXAM: HIGH RESOLUTION CHEST 09/03/2024 08:57:43 AM TECHNIQUE: CT of the chest was performed without the administration of intravenous contrast. High resolution CT imaging was  performed of the lungs. Multiplanar reformatted images are provided for review. Automated exposure control, iterative reconstruction, and/or weight based adjustment of the mA/kV was utilized to reduce the radiation dose to as low as reasonably achievable. High resolution CT images were performed in the supine inspiration, supine expiration, and prone inspiration positions. COMPARISON: 02/11/2023 chest CT and 07/09/2022 high resolution chest CT. CLINICAL HISTORY: FINDINGS: MEDIASTINUM AND LYMPH NODES: Ovoid fat density 3.5 x 1.8 cm right pericardial lesion with coarse peripheral calcification, stable size and with increased peripheral calcification. Chronic symmetric mild axillary adenopathy, largest 1.1 cm on the right on series 2, image 30 and 1.1 cm on the left on image 34, not appreciably changed. No pathologically enlarged mediastinal or discrete hilar nodes on these noncontrast images. HRCT FINDINGS AND LUNGS AND PLEURA: No pneumothorax. No pleural  effusion. The central airways are clear. No acute consolidative airspace disease. No lung masses. Several scattered small solid pulmonary nodules in the right greater than left lungs, largest 4 mm in the posterior right lower lobe on series 8, image 100, all stable from 07/09/2022 chest CT. No new significant pulmonary nodules. No significant lobular air trapping or evidence of tracheobronchomalacia on the expiration sequence. No significant regions of subpleural reticulation, ground glass opacity, traction bronchiectasis, architectural distortion or frank honeycombing. Minimal paraseptal emphysema at the lung apices, unchanged. Minimal chronic ground glass tree in bud opacity in the posterior right upper lobe is unchanged. UPPER ABDOMEN: No acute abnormality. SOFT TISSUES AND BONES: No acute abnormality of the bones. No acute abnormality of the soft tissues. IMPRESSION: 1. No evidence of interstitial lung disease. 2. Stable scattered small solid pulmonary nodules,  largest 4 mm, considered benign, no follow-up recommended. 3. Minimal paraseptal emphysema. 4. Right pericardial fat density structure is stable size and with increased peripheral coarse calcification, favor a benign lipomatous lesion with evolving fat necrosis. Electronically signed by: Selinda Blue MD 09/05/2024 12:38 PM EDT RP Workstation: [id number]    No results found.  No results found.  Assessment and Plan: Patient Active Problem List   Diagnosis Date Noted   Abnormal CT scan 02/18/2023   Hemorrhagic cyst of right ovary 11/26/2019   Menorrhagia with regular cycle 11/07/2019   Iron deficiency anemia 11/07/2019   Inflammatory polyarthritis (HCC) 11/07/2019   Vitamin B12 deficiency 08/03/2019   Raynaud's disease without gangrene 08/03/2019   Mild intermittent asthma without complication 06/19/2019   BMI 39.0-39.9,adult 06/19/2019   Sore throat 12/23/2018   Flu-like symptoms 12/23/2018   Arnold-Chiari syndrome (HCC) 12/08/2018   Candidiasis 12/08/2018   Body mass index 40.0-44.9, adult (HCC) 12/08/2018   Impaired fasting glucose 09/07/2018   Other fatigue 09/07/2018   Vitamin D  deficiency 09/07/2018   Encounter for annual general medical examination with abnormal findings in adult 05/28/2018   Screening for malignant neoplasm of cervix 05/28/2018   Dysuria 05/28/2018   Allergic rhinitis 04/16/2018   Acute recurrent sinusitis 03/20/2018   Tachycardia 08/20/2016   Shortness of breath 08/20/2016   Congestive dilated cardiomyopathy (HCC) 11/22/2015   Lupus 11/22/2015   Moderate obesity 11/22/2015   OSA (obstructive sleep apnea) 11/22/2015   Essential hypertension 11/22/2015   Chronic systolic CHF (congestive heart failure) (HCC) 08/21/2015   Numbness and tingling 08/04/2014   Chiari malformation 08/04/2014   Sleep disorder 08/04/2014   Postprocedural pseudomeningocele 07/07/2014   Chiari malformation type I (HCC) 06/23/2014   Carpal tunnel syndrome 02/14/2014   Depression  02/14/2014   Neuralgia 02/14/2014   Migraines 02/14/2014   Sleep apnea 02/14/2014   Systemic lupus erythematosus (HCC) 02/14/2014   Hypertension 02/14/2014    1. Emphysema lung (HCC) (Primary) CT scan was reviewed no interstitial lung disease but interestingly some emphysematous changes.  Will get an alpha-1 level on her - Alpha-1-antitrypsin  2. Obesity, morbid (HCC) Needs to work on weight loss diet and exercise.  Discussed different methods that she can start doing to help her lose weight  3. OSA on CPAP CPAP needs to be continued will continue with compliance checks.  She is appears to be doing well  General Counseling: I have discussed the findings of the evaluation and examination with Celise.  I have also discussed any further diagnostic evaluation thatmay be needed or ordered today. Kensleigh verbalizes understanding of the findings of todays visit. We also reviewed her medications today and discussed drug  interactions and side effects including but not limited excessive drowsiness and altered mental states. We also discussed that there is always a risk not just to her but also people around her. she has been encouraged to call the office with any questions or concerns that should arise related to todays visit.  No orders of the defined types were placed in this encounter.    Time spent: 55  I have personally obtained a history, examined the patient, evaluated laboratory and imaging results, formulated the assessment and plan and placed orders.    Elfreda DELENA Bathe, MD [facility] Pulmonary and Critical Care Sleep medicine

## 2024-10-28 ENCOUNTER — Encounter: Payer: Self-pay | Admitting: Physician Assistant

## 2024-10-28 ENCOUNTER — Ambulatory Visit: Admitting: Physician Assistant

## 2024-10-28 VITALS — BP 115/84 | HR 98 | Temp 98.0°F | Resp 16 | Ht 66.0 in | Wt 216.0 lb

## 2024-10-28 DIAGNOSIS — J329 Chronic sinusitis, unspecified: Secondary | ICD-10-CM

## 2024-10-28 MED ORDER — DOXYCYCLINE HYCLATE 100 MG PO TABS: 100.0000 mg | ORAL_TABLET | Freq: Two times a day (BID) | ORAL | 0 refills | Status: AC

## 2024-10-28 NOTE — Progress Notes (Signed)
 [facility] [address] [place], [place] [address]  Internal MEDICINE  Office Visit Note  Patient Name: Morgan Kent  [id number]  [id number]  Date of Service: 10/28/2024  Chief Complaint  Patient presents with   Acute Visit   Sinusitis   Ear Pain     HPI Pt is here for a sick visit. -Pt was on ABX with urgent care for sinus congestion, ear pain, and sore throat.  -Thinks it was augmentin ; prior to this she had been prescribed zpak -mucinex, nasal spray, inhalers, advised on nasal rinse by urgent care, but she was scared to try this -she does have an ENT and will schedule if not improving this time  Current Medication:  Outpatient Encounter Medications as of 10/28/2024  Medication Sig   albuterol  (VENTOLIN  HFA) 108 (90 Base) MCG/ACT inhaler INHALE 2 PUFFS INTO THE LUNGS DAILY   amLODipine  (NORVASC ) 5 MG tablet Take 5 mg by mouth daily.   budeson-glycopyrrolate -formoterol (BREZTRI  AEROSPHERE) 160-9-4.8 MCG/ACT AERO inhaler INHALE 2 PUFFS BY MOUTH INTO THE LUNGS TWICE DAILY   carvedilol  (COREG ) 25 MG tablet TAKE 1 TABLET(25 MG) BY MOUTH TWICE DAILY   cetirizine  (ZYRTEC ) 10 MG tablet Take 1 tablet (10 mg total) by mouth daily.   clobetasol  ointment (TEMOVATE ) 0.05 % Apply 1 Application topically as directed. qd up to 5 days a week to lupus on arms as needed for flares, avoid face, groin, axilla   clotrimazole  (MYCELEX ) 10 MG troche TAKE 1 BY MOUTH EVERY DAY, SUCH ON ONE TROCHE UNTIL DISSOLVED   cyclobenzaprine  (FLEXERIL ) 10 MG tablet Take 1 tablet (10 mg total) by mouth 3 (three) times daily as needed.   Dapsone  7.5 % GEL APPLY TOPICALLY TO FACE TWICE DAILY EVERY DAY AS DIRECTED FOR ACNE   diclofenac  Sodium (VOLTAREN ) 1 % GEL APPLY 2 GRAMS TOPICALLY TO THE AFFECTED AREA FOUR TIMES DAILY   doxycycline  (VIBRA -TABS) 100 MG tablet Take 1 tablet (100 mg total) by mouth 2 (two) times daily.   fluticasone  (FLONASE ) 50 MCG/ACT nasal spray USE 1 SPRAY IN RIGHT NOSTRIL AT  BEDTIME   folic acid  (FOLVITE ) 1 MG tablet Take 1 mg by mouth daily.   gabapentin  (NEURONTIN ) 300 MG capsule TAKE 1 CAPSULE(300 MG) BY MOUTH THREE TIMES DAILY   hydroxychloroquine  (PLAQUENIL ) 200 MG tablet Take 1 tablet (200 mg total) by mouth daily.   hydrOXYzine  (VISTARIL ) 25 MG capsule Take 1 capsule (25 mg total) by mouth every 8 (eight) hours as needed for anxiety or itching (or panic).   JARDIANCE  10 MG TABS tablet TAKE 1 TABLET(10 MG) BY MOUTH DAILY BEFORE BREAKFAST   ketoconazole  (NIZORAL ) 2 % shampoo APPLY TOPICALLY 1 TIME A WEEK   methotrexate (RHEUMATREX) 2.5 MG tablet Take 2.5 mg by mouth once a week. Caution:Chemotherapy. Protect from light.   mometasone  (ELOCON ) 0.1 % lotion APPLY TOPICALLY AS DIRECTED ONCE DAILY FOR UP TO 5 DAYS PER WEEK TO THE AFFECTED AREA OF SCALP AS NEEDED FOR FLARES   mometasone  (ELOCON ) 0.1 % ointment APPLY TOPICALLY TO SORES ON FACE ONCE DAILY FOR UP TO 5 DAYS PER WEEK   montelukast  (SINGULAIR ) 10 MG tablet Take 1 tablet (10 mg total) by mouth at bedtime.   mupirocin  ointment (BACTROBAN ) 2 % Apply 1 Application topically daily. Qd to open sores face, body prn flares   NON FORMULARY CPAP with AHP   norethindrone -ethinyl estradiol-iron (AUROVELA FE 1.5/30) 1.5-30 MG-MCG tablet Take 1 tablet by mouth daily.   oxyCODONE -acetaminophen  (PERCOCET) 7.5-325 MG tablet Take  1 tablet by mouth every 4 (four) hours as needed for severe pain.   sacubitril -valsartan  (ENTRESTO ) 24-26 MG Take 1 tablet by mouth 2 (two) times daily.   spironolactone  (ALDACTONE ) 25 MG tablet Take 0.5 tablets (12.5 mg total) by mouth daily.   tacrolimus  (PROGRAF ) 1 MG capsule OPEN 1 CAPSULE AND DISSOLVE INTO A HALF LITER OF WATER . SWISH AROUND FOR 2 MINS THEN SPIT TWICE DAILY   Vitamin D , Ergocalciferol , (DRISDOL ) 1.25 MG (50000 UNIT) CAPS capsule TAKE 1 CAPSULE BY MOUTH EVERY WEEK   Cyanocobalamin  (B-12 COMPLIANCE INJECTION) 1000 MCG/ML KIT Inject as directed. (Patient not taking: Reported on  10/28/2024)   diclofenac  (VOLTAREN ) 50 MG EC tablet Take 50 mg by mouth 2 (two) times daily. (Patient not taking: Reported on 10/28/2024)   FEROSUL 325 (65 Fe) MG tablet TAKE 1 TABLET BY MOUTH DAILY WITH BREAKFAST (Patient not taking: Reported on 10/28/2024)   Polyethyl Glycol-Propyl Glycol (SYSTANE ULTRA) 0.4-0.3 % SOLN Place 1 drop into both eyes daily as needed (for dry eyes).  (Patient not taking: Reported on 10/28/2024)   tacrolimus  (PROTOPIC ) 0.1 % ointment APPLY TOPICALLY TO THE AFFECTED AREA DAILY (Patient not taking: Reported on 10/28/2024)   No facility-administered encounter medications on file as of 10/28/2024.      Medical History: Past Medical History:  Diagnosis Date   Anxiety    Arthritis    rheumo possible   Chiari malformation    Depression    Dizziness    Headache(784.0)    History of kidney stones    Hypertension    Lupus    Lupus    Nerve damage    NECK   PONV (postoperative nausea and vomiting)    nausea only   Sleep apnea    TMJ arthritis      Vital Signs: BP 115/84   Pulse 98   Temp 98 F (36.7 C)   Resp 16   Ht 5' 6 (1.676 m)   Wt 216 lb (98 kg)   SpO2 95%   BMI 34.86 kg/m    Review of Systems  Constitutional:  Negative for fatigue and fever.  HENT:  Positive for congestion, ear pain, postnasal drip and sinus pressure. Negative for mouth sores.   Respiratory:  Positive for cough.   Cardiovascular:  Negative for chest pain.  Genitourinary:  Negative for flank pain.  Psychiatric/Behavioral: Negative.      Physical Exam Vitals reviewed.  Constitutional:      General: She is not in acute distress.    Appearance: Normal appearance. She is obese.  HENT:     Head: Normocephalic and atraumatic.     Right Ear: Tympanic membrane normal.     Left Ear: Tympanic membrane normal.     Nose: Congestion present.     Mouth/Throat:     Mouth: Mucous membranes are moist.  Eyes:     Pupils: Pupils are equal, round, and reactive to light.   Cardiovascular:     Rate and Rhythm: Normal rate and regular rhythm.     Heart sounds: Normal heart sounds. No murmur heard. Pulmonary:     Effort: Pulmonary effort is normal. No respiratory distress.     Breath sounds: Normal breath sounds. No wheezing.  Skin:    General: Skin is warm and dry.  Neurological:     Mental Status: She is alert and oriented to person, place, and time.  Psychiatric:        Attention and Perception: Attention normal.  Mood and Affect: Affect normal. Mood is anxious.        Speech: Speech normal.        Behavior: Behavior normal. Behavior is cooperative.        Thought Content: Thought content normal.        Cognition and Memory: Cognition normal.        Judgment: Judgment normal.       Assessment/Plan: 1. Recurrent sinusitis (Primary) Will start Doxycycline  BID with food. Advised on mucinex and nasal spray. If not improving then will need to see ENT as she has had several ABX - doxycycline  (VIBRA -TABS) 100 MG tablet; Take 1 tablet (100 mg total) by mouth 2 (two) times daily.  Dispense: 14 tablet; Refill: 0   General Counseling: Avnoor verbalizes understanding of the findings of todays visit and agrees with plan of treatment. I have discussed any further diagnostic evaluation that may be needed or ordered today. We also reviewed her medications today. she has been encouraged to call the office with any questions or concerns that should arise related to todays visit.    Counseling:    No orders of the defined types were placed in this encounter.   Meds ordered this encounter  Medications   doxycycline  (VIBRA -TABS) 100 MG tablet    Sig: Take 1 tablet (100 mg total) by mouth 2 (two) times daily.    Dispense:  14 tablet    Refill:  0    Time spent:25 Minutes

## 2024-11-09 ENCOUNTER — Other Ambulatory Visit: Payer: Self-pay | Admitting: Medical

## 2024-11-18 ENCOUNTER — Other Ambulatory Visit: Payer: Self-pay

## 2024-11-18 MED ORDER — AMLODIPINE BESYLATE 5 MG PO TABS: 5.0000 mg | ORAL_TABLET | Freq: Every day | ORAL | 1 refills | Status: AC

## 2024-11-19 ENCOUNTER — Other Ambulatory Visit: Payer: Self-pay | Admitting: Physician Assistant

## 2024-11-19 DIAGNOSIS — D509 Iron deficiency anemia, unspecified: Secondary | ICD-10-CM

## 2024-11-29 ENCOUNTER — Encounter: Payer: Self-pay | Admitting: Internal Medicine

## 2024-11-29 ENCOUNTER — Ambulatory Visit: Admitting: Internal Medicine

## 2024-11-29 VITALS — BP 110/74 | HR 95 | Temp 98.0°F | Resp 16 | Ht 66.0 in | Wt 220.0 lb

## 2024-11-29 DIAGNOSIS — G63 Polyneuropathy in diseases classified elsewhere: Secondary | ICD-10-CM

## 2024-11-29 DIAGNOSIS — G4733 Obstructive sleep apnea (adult) (pediatric): Secondary | ICD-10-CM

## 2024-11-29 DIAGNOSIS — J439 Emphysema, unspecified: Secondary | ICD-10-CM

## 2024-11-29 NOTE — Progress Notes (Unsigned)
 [facility] [address] [place], [place] [address]  Pulmonary Sleep Medicine   Office Visit Note  Patient Name: Morgan Kent DOB: August 30, 1981 MRN [id number]  Date of Service: 11/29/2024  Complaints/HPI: She did not have the alpha level done. She states she went to the lab and had it drawn. She states her breathing is doing well. She has not had any flareups noted. Denies chest pain or palpitations. No fevers or chill.   Office Spirometry Results:     ROS  General: (-) fever, (-) chills, (-) night sweats, (-) weakness Skin: (-) rashes, (-) itching,. Eyes: (-) visual changes, (-) redness, (-) itching. Nose and Sinuses: (-) nasal stuffiness or itchiness, (-) postnasal drip, (-) nosebleeds, (-) sinus trouble. Mouth and Throat: (-) sore throat, (-) hoarseness. Neck: (-) swollen glands, (-) enlarged thyroid , (-) neck pain. Respiratory: - cough, (-) bloody sputum, - shortness of breath, - wheezing. Cardiovascular: - ankle swelling, (-) chest pain. Lymphatic: (-) lymph node enlargement. Neurologic: (-) numbness, (-) tingling. Psychiatric: (-) anxiety, (-) depression   Current Medication: Outpatient Encounter Medications as of 11/29/2024  Medication Sig   albuterol  (VENTOLIN  HFA) 108 (90 Base) MCG/ACT inhaler INHALE 2 PUFFS INTO THE LUNGS DAILY   amLODipine  (NORVASC ) 5 MG tablet Take 1 tablet (5 mg total) by mouth daily.   budeson-glycopyrrolate -formoterol (BREZTRI  AEROSPHERE) 160-9-4.8 MCG/ACT AERO inhaler INHALE 2 PUFFS BY MOUTH INTO THE LUNGS TWICE DAILY   carvedilol  (COREG ) 25 MG tablet TAKE 1 TABLET(25 MG) BY MOUTH TWICE DAILY   cetirizine  (ZYRTEC ) 10 MG tablet Take 1 tablet (10 mg total) by mouth daily.   clobetasol  ointment (TEMOVATE ) 0.05 % Apply 1 Application topically as directed. qd up to 5 days a week to lupus on arms as needed for flares, avoid face, groin, axilla   clotrimazole  (MYCELEX ) 10 MG troche TAKE 1 BY MOUTH EVERY DAY, SUCH ON ONE TROCHE UNTIL  DISSOLVED   Cyanocobalamin  (B-12 COMPLIANCE INJECTION) 1000 MCG/ML KIT Inject as directed.   cyclobenzaprine  (FLEXERIL ) 10 MG tablet Take 1 tablet (10 mg total) by mouth 3 (three) times daily as needed.   Dapsone  7.5 % GEL APPLY TOPICALLY TO FACE TWICE DAILY EVERY DAY AS DIRECTED FOR ACNE   diclofenac  (VOLTAREN ) 50 MG EC tablet Take 50 mg by mouth 2 (two) times daily.   diclofenac  Sodium (VOLTAREN ) 1 % GEL APPLY 2 GRAMS TOPICALLY TO THE AFFECTED AREA FOUR TIMES DAILY   doxycycline  (VIBRA -TABS) 100 MG tablet Take 1 tablet (100 mg total) by mouth 2 (two) times daily.   FEROSUL 325 (65 Fe) MG tablet TAKE 1 TABLET BY MOUTH DAILY WITH BREAKFAST   fluticasone  (FLONASE ) 50 MCG/ACT nasal spray USE 1 SPRAY IN RIGHT NOSTRIL AT BEDTIME   folic acid  (FOLVITE ) 1 MG tablet Take 1 mg by mouth daily.   gabapentin  (NEURONTIN ) 300 MG capsule TAKE 1 CAPSULE(300 MG) BY MOUTH THREE TIMES DAILY   hydroxychloroquine  (PLAQUENIL ) 200 MG tablet Take 1 tablet (200 mg total) by mouth daily.   hydrOXYzine  (VISTARIL ) 25 MG capsule Take 1 capsule (25 mg total) by mouth every 8 (eight) hours as needed for anxiety or itching (or panic).   JARDIANCE  10 MG TABS tablet TAKE 1 TABLET(10 MG) BY MOUTH DAILY BEFORE BREAKFAST   ketoconazole  (NIZORAL ) 2 % shampoo APPLY TOPICALLY 1 TIME A WEEK   lisinopril  (ZESTRIL ) 5 MG tablet TAKE 1 TABLET(5 MG) BY MOUTH DAILY   methotrexate (RHEUMATREX) 2.5 MG tablet Take 2.5 mg by mouth once a week. Caution:Chemotherapy. Protect from  light.   mometasone  (ELOCON ) 0.1 % lotion APPLY TOPICALLY AS DIRECTED ONCE DAILY FOR UP TO 5 DAYS PER WEEK TO THE AFFECTED AREA OF SCALP AS NEEDED FOR FLARES   mometasone  (ELOCON ) 0.1 % ointment APPLY TOPICALLY TO SORES ON FACE ONCE DAILY FOR UP TO 5 DAYS PER WEEK   montelukast  (SINGULAIR ) 10 MG tablet Take 1 tablet (10 mg total) by mouth at bedtime.   mupirocin  ointment (BACTROBAN ) 2 % Apply 1 Application topically daily. Qd to open sores face, body prn flares   NON  FORMULARY CPAP with AHP   norethindrone -ethinyl estradiol-iron (AUROVELA FE 1.5/30) 1.5-30 MG-MCG tablet Take 1 tablet by mouth daily.   oxyCODONE -acetaminophen  (PERCOCET) 7.5-325 MG tablet Take 1 tablet by mouth every 4 (four) hours as needed for severe pain.   Polyethyl Glycol-Propyl Glycol (SYSTANE ULTRA) 0.4-0.3 % SOLN Place 1 drop into both eyes daily as needed (for dry eyes).    sacubitril -valsartan  (ENTRESTO ) 24-26 MG Take 1 tablet by mouth 2 (two) times daily.   spironolactone  (ALDACTONE ) 25 MG tablet Take 0.5 tablets (12.5 mg total) by mouth daily.   tacrolimus  (PROGRAF ) 1 MG capsule OPEN 1 CAPSULE AND DISSOLVE INTO A HALF LITER OF WATER . SWISH AROUND FOR 2 MINS THEN SPIT TWICE DAILY   tacrolimus  (PROTOPIC ) 0.1 % ointment APPLY TOPICALLY TO THE AFFECTED AREA DAILY   Vitamin D , Ergocalciferol , (DRISDOL ) 1.25 MG (50000 UNIT) CAPS capsule TAKE 1 CAPSULE BY MOUTH EVERY WEEK   No facility-administered encounter medications on file as of 11/29/2024.    Surgical History: Past Surgical History:  Procedure Laterality Date   carpel tunnel  Left 2014   CRANIOTOMY  14,06   chiari   DENTAL SURGERY     HERNIA REPAIR     umbilical   PLACEMENT OF LUMBAR DRAIN N/A 07/07/2014   Procedure: PLACEMENT OF LUMBAR DRAIN AND CLOSURE OF CERVICAL INCISION;  Surgeon: Reyes Budge, MD;  Location: [facility];  Service: Neurosurgery;  Laterality: N/A;   SUBOCCIPITAL CRANIECTOMY CERVICAL LAMINECTOMY N/A 09/29/2013   Procedure: SUBOCCIPITAL CRANIECTOMY CERVICAL LAMINECTOMY/DURAPLASTY;  Surgeon: Reyes JONETTA Budge, MD;  Location: [facility];  Service: Neurosurgery;  Laterality: N/A;  posterior   SUBOCCIPITAL CRANIECTOMY CERVICAL LAMINECTOMY N/A 06/23/2014   Procedure: SUBOCCIPITAL CRANIECTOMY CERVICAL LAMINECTOMY/DURAPLASTY;  Surgeon: Reyes Budge, MD;  Location: [facility];  Service: Neurosurgery;  Laterality: N/A;  suboccipital craniectomy with cervical laminectomy and duraplasty    Medical  History: Past Medical History:  Diagnosis Date   Anxiety    Arthritis    rheumo possible   Chiari malformation    Depression    Dizziness    Headache(784.0)    History of kidney stones    Hypertension    Lupus    Lupus    Nerve damage    NECK   PONV (postoperative nausea and vomiting)    nausea only   Sleep apnea    TMJ arthritis     Family History: Family History  Problem Relation Age of Onset   Hypertension Mother    Thyroid  disease Mother    Heart disease Father    Hypertension Father    Breast cancer Other 22 - 86       maternal great grandmaw   Asthma Other    Depression Other    Diabetes Other    Migraines Other    Stroke Other     Social History: Social History   Socioeconomic History   Marital status: Single    Spouse name: Not  on file   Number of children: Not on file   Years of education: Not on file   Highest education level: Not on file  Occupational History   Not on file  Tobacco Use   Smoking status: Never   Smokeless tobacco: Never  Vaping Use   Vaping status: Never Used  Substance and Sexual Activity   Alcohol  use: No    Alcohol /week: 0.0 standard drinks of alcohol    Drug use: No   Sexual activity: Not on file  Other Topics Concern   Not on file  Social History Narrative   Lives with 3 children in 2 story home.  Unemployed.  Trying to get disability.  Education: high school.   Social Drivers of Health   Tobacco Use: Low Risk (11/29/2024)   Patient History    Smoking Tobacco Use: Never    Smokeless Tobacco Use: Never    Passive Exposure: Not on file  Financial Resource Strain: Not on file  Food Insecurity: Not on file  Transportation Needs: Not on file  Physical Activity: Not on file  Stress: Not on file  Social Connections: Not on file  Intimate Partner Violence: Not on file  Depression (PHQ2-9): Low Risk (10/22/2024)   Depression (PHQ2-9)    PHQ-2 Score: 0  Alcohol  Screen: Low Risk (06/13/2022)   Alcohol  Screen    Last  Alcohol  Screening Score (AUDIT): 0  Housing: Not on file  Utilities: Not on file  Health Literacy: Not on file    Vital Signs: Blood pressure 110/74, pulse 95, temperature 98 F (36.7 C), resp. rate 16, height 5' 6 (1.676 m), weight 220 lb (99.8 kg), SpO2 96%.  Examination: General Appearance: The patient is well-developed, well-nourished, and in no distress. Skin: Gross inspection of skin unremarkable. Head: normocephalic, no gross deformities. Eyes: no gross deformities noted. ENT: ears appear grossly normal no exudates. Neck: Supple. No thyromegaly. No LAD. Respiratory: no rhonci noted. Cardiovascular: Normal S1 and S2 without murmur or rub. Extremities: No cyanosis. pulses are equal. Neurologic: Alert and oriented. No involuntary movements.  LABS: Recent Results (from the past 2160 hours)  Pulmonary Function Test     Status: None   Collection Time: 09/01/24  3:09 PM  Result Value Ref Range   FEV1     FVC     FEV1/FVC     TLC     DLCO    Basic metabolic panel with GFR     Status: Abnormal   Collection Time: 09/03/24  9:40 AM  Result Value Ref Range   Glucose 105 (H) 70 - 99 mg/dL   BUN 5 (L) 6 - 24 mg/dL   Creatinine, Ser 9.00 0.57 - 1.00 mg/dL   eGFR 72 >40 fO/fpw/8.26   BUN/Creatinine Ratio 5 (L) 9 - 23   Sodium 140 134 - 144 mmol/L   Potassium 4.3 3.5 - 5.2 mmol/L   Chloride 101 96 - 106 mmol/L   CO2 24 20 - 29 mmol/L   Calcium 9.2 8.7 - 10.2 mg/dL  CBC with Differential/Platelet     Status: Abnormal   Collection Time: 09/03/24 11:35 AM  Result Value Ref Range   WBC 4.4 4.0 - 10.5 K/uL   RBC 3.67 (L) 3.87 - 5.11 MIL/uL   Hemoglobin 11.5 (L) 12.0 - 15.0 g/dL   HCT 65.1 (L) 63.9 - 53.9 %   MCV 94.8 80.0 - 100.0 fL   MCH 31.3 26.0 - 34.0 pg   MCHC 33.0 30.0 - 36.0 g/dL  RDW 13.2 11.5 - 15.5 %   Platelets 254 150 - 400 K/uL   nRBC 0.0 0.0 - 0.2 %   Neutrophils Relative % 63 %   Neutro Abs 2.8 1.7 - 7.7 K/uL   Lymphocytes Relative 26 %   Lymphs Abs 1.1  0.7 - 4.0 K/uL   Monocytes Relative 9 %   Monocytes Absolute 0.4 0.1 - 1.0 K/uL   Eosinophils Relative 2 %   Eosinophils Absolute 0.1 0.0 - 0.5 K/uL   Basophils Relative 0 %   Basophils Absolute 0.0 0.0 - 0.1 K/uL   Immature Granulocytes 0 %   Abs Immature Granulocytes 0.01 0.00 - 0.07 K/uL    Comment: Performed at [facility], [address] [place], [place] [address]  POCT glycosylated hemoglobin (Hb A1C)     Status: Abnormal   Collection Time: 10/22/24  9:55 AM  Result Value Ref Range   Hemoglobin A1C 5.7 (A) 4.0 - 5.6 %   HbA1c POC (<> result, manual entry)     HbA1c, POC (prediabetic range)     HbA1c, POC (controlled diabetic range)      Radiology: CT Chest High Resolution Result Date: 09/05/2024 EXAM: HIGH RESOLUTION CHEST 09/03/2024 08:57:43 AM TECHNIQUE: CT of the chest was performed without the administration of intravenous contrast. High resolution CT imaging was performed of the lungs. Multiplanar reformatted images are provided for review. Automated exposure control, iterative reconstruction, and/or weight based adjustment of the mA/kV was utilized to reduce the radiation dose to as low as reasonably achievable. High resolution CT images were performed in the supine inspiration, supine expiration, and prone inspiration positions. COMPARISON: 02/11/2023 chest CT and 07/09/2022 high resolution chest CT. CLINICAL HISTORY: FINDINGS: MEDIASTINUM AND LYMPH NODES: Ovoid fat density 3.5 x 1.8 cm right pericardial lesion with coarse peripheral calcification, stable size and with increased peripheral calcification. Chronic symmetric mild axillary adenopathy, largest 1.1 cm on the right on series 2, image 30 and 1.1 cm on the left on image 34, not appreciably changed. No pathologically enlarged mediastinal or discrete hilar nodes on these noncontrast images. HRCT FINDINGS AND LUNGS AND PLEURA: No pneumothorax. No pleural effusion. The central airways are clear. No acute consolidative  airspace disease. No lung masses. Several scattered small solid pulmonary nodules in the right greater than left lungs, largest 4 mm in the posterior right lower lobe on series 8, image 100, all stable from 07/09/2022 chest CT. No new significant pulmonary nodules. No significant lobular air trapping or evidence of tracheobronchomalacia on the expiration sequence. No significant regions of subpleural reticulation, ground glass opacity, traction bronchiectasis, architectural distortion or frank honeycombing. Minimal paraseptal emphysema at the lung apices, unchanged. Minimal chronic ground glass tree in bud opacity in the posterior right upper lobe is unchanged. UPPER ABDOMEN: No acute abnormality. SOFT TISSUES AND BONES: No acute abnormality of the bones. No acute abnormality of the soft tissues. IMPRESSION: 1. No evidence of interstitial lung disease. 2. Stable scattered small solid pulmonary nodules, largest 4 mm, considered benign, no follow-up recommended. 3. Minimal paraseptal emphysema. 4. Right pericardial fat density structure is stable size and with increased peripheral coarse calcification, favor a benign lipomatous lesion with evolving fat necrosis. Electronically signed by: Selinda Blue MD 09/05/2024 12:38 PM EDT RP Workstation: [id number]    No results found.  No results found.  Assessment and Plan: Patient Active Problem List   Diagnosis Date Noted   Abnormal CT scan 02/18/2023   Hemorrhagic cyst of right ovary 11/26/2019  Menorrhagia with regular cycle 11/07/2019   Iron deficiency anemia 11/07/2019   Inflammatory polyarthritis (HCC) 11/07/2019   Vitamin B12 deficiency 08/03/2019   Raynaud's disease without gangrene 08/03/2019   Mild intermittent asthma without complication 06/19/2019   BMI 39.0-39.9,adult 06/19/2019   Sore throat 12/23/2018   Flu-like symptoms 12/23/2018   Arnold-Chiari syndrome (HCC) 12/08/2018   Candidiasis 12/08/2018   Body mass index 40.0-44.9, adult (HCC)  12/08/2018   Impaired fasting glucose 09/07/2018   Other fatigue 09/07/2018   Vitamin D  deficiency 09/07/2018   Encounter for annual general medical examination with abnormal findings in adult 05/28/2018   Screening for malignant neoplasm of cervix 05/28/2018   Dysuria 05/28/2018   Allergic rhinitis 04/16/2018   Acute recurrent sinusitis 03/20/2018   Tachycardia 08/20/2016   Shortness of breath 08/20/2016   Congestive dilated cardiomyopathy (HCC) 11/22/2015   Lupus 11/22/2015   Moderate obesity 11/22/2015   OSA (obstructive sleep apnea) 11/22/2015   Essential hypertension 11/22/2015   Chronic systolic CHF (congestive heart failure) (HCC) 08/21/2015   Numbness and tingling 08/04/2014   Chiari malformation 08/04/2014   Sleep disorder 08/04/2014   Postprocedural pseudomeningocele 07/07/2014   Chiari malformation type I (HCC) 06/23/2014   Carpal tunnel syndrome 02/14/2014   Depression 02/14/2014   Neuralgia 02/14/2014   Migraines 02/14/2014   Sleep apnea 02/14/2014   Systemic lupus erythematosus (HCC) 02/14/2014   Hypertension 02/14/2014    1. OSA (obstructive sleep apnea) (Primary) ***  2. Obesity, morbid (HCC) ***  3. Emphysema lung (HCC) No alpha results  4. Polyneuropathy associated with underlying disease ***   General Counseling: I have discussed the findings of the evaluation and examination with Jemiah.  I have also discussed any further diagnostic evaluation thatmay be needed or ordered today. Cimberly verbalizes understanding of the findings of todays visit. We also reviewed her medications today and discussed drug interactions and side effects including but not limited excessive drowsiness and altered mental states. We also discussed that there is always a risk not just to her but also people around her. she has been encouraged to call the office with any questions or concerns that should arise related to todays visit.  No orders of the defined types were placed in  this encounter.    Time spent: ***  I have personally obtained a history, examined the patient, evaluated laboratory and imaging results, formulated the assessment and plan and placed orders.    Elfreda DELENA Bathe, MD [facility] Pulmonary and Critical Care Sleep medicine

## 2024-11-30 LAB — ALPHA-1-ANTITRYPSIN: A-1 Antitrypsin: 185 mg/dL (ref 101–187)

## 2024-12-14 ENCOUNTER — Ambulatory Visit (INDEPENDENT_AMBULATORY_CARE_PROVIDER_SITE_OTHER): Admitting: Nurse Practitioner

## 2024-12-14 ENCOUNTER — Encounter: Payer: Self-pay | Admitting: Nurse Practitioner

## 2024-12-14 VITALS — BP 120/86 | HR 100 | Temp 96.2°F | Resp 16 | Ht 66.0 in | Wt 216.4 lb

## 2024-12-14 DIAGNOSIS — F410 Panic disorder [episodic paroxysmal anxiety] without agoraphobia: Secondary | ICD-10-CM

## 2024-12-14 DIAGNOSIS — F430 Acute stress reaction: Secondary | ICD-10-CM

## 2024-12-14 DIAGNOSIS — F068 Other specified mental disorders due to known physiological condition: Secondary | ICD-10-CM | POA: Diagnosis not present

## 2024-12-14 DIAGNOSIS — L299 Pruritus, unspecified: Secondary | ICD-10-CM

## 2024-12-14 MED ORDER — HYDROXYZINE PAMOATE 25 MG PO CAPS: 25.0000 mg | ORAL_CAPSULE | Freq: Three times a day (TID) | ORAL | 3 refills | Status: AC | PRN

## 2024-12-16 ENCOUNTER — Other Ambulatory Visit: Payer: Self-pay | Admitting: Physician Assistant

## 2024-12-16 DIAGNOSIS — Z3041 Encounter for surveillance of contraceptive pills: Secondary | ICD-10-CM

## 2024-12-21 ENCOUNTER — Ambulatory Visit: Admitting: Dermatology

## 2025-01-06 ENCOUNTER — Ambulatory Visit: Admitting: Physician Assistant

## 2025-02-08 ENCOUNTER — Ambulatory Visit: Admitting: Medical

## 2025-02-21 ENCOUNTER — Ambulatory Visit: Admitting: Physician Assistant

## 2025-02-28 ENCOUNTER — Ambulatory Visit: Admitting: Internal Medicine

## 2025-06-27 ENCOUNTER — Ambulatory Visit: Admitting: Physician Assistant
